# Patient Record
Sex: Male | Born: 1970 | Race: White | Hispanic: No | Smoking: Former smoker
Health system: Southern US, Community
[De-identification: ages and names within clinical notes are randomized; demographics above are authoritative.]

## PROBLEM LIST (undated history)

## (undated) ENCOUNTER — Ambulatory Visit: Admission: EM | Payer: Medicaid Other | Source: Home / Self Care

## (undated) DIAGNOSIS — D127 Benign neoplasm of rectosigmoid junction: Secondary | ICD-10-CM

## (undated) DIAGNOSIS — I152 Hypertension secondary to endocrine disorders: Secondary | ICD-10-CM

## (undated) DIAGNOSIS — K644 Residual hemorrhoidal skin tags: Secondary | ICD-10-CM

## (undated) DIAGNOSIS — K648 Other hemorrhoids: Secondary | ICD-10-CM

## (undated) DIAGNOSIS — K3189 Other diseases of stomach and duodenum: Secondary | ICD-10-CM

## (undated) DIAGNOSIS — E1159 Type 2 diabetes mellitus with other circulatory complications: Secondary | ICD-10-CM

## (undated) DIAGNOSIS — E785 Hyperlipidemia, unspecified: Secondary | ICD-10-CM

## (undated) DIAGNOSIS — K5730 Diverticulosis of large intestine without perforation or abscess without bleeding: Secondary | ICD-10-CM

## (undated) DIAGNOSIS — K5732 Diverticulitis of large intestine without perforation or abscess without bleeding: Secondary | ICD-10-CM

## (undated) DIAGNOSIS — K4020 Bilateral inguinal hernia, without obstruction or gangrene, not specified as recurrent: Secondary | ICD-10-CM

## (undated) DIAGNOSIS — G4730 Sleep apnea, unspecified: Secondary | ICD-10-CM

## (undated) DIAGNOSIS — K429 Umbilical hernia without obstruction or gangrene: Secondary | ICD-10-CM

## (undated) DIAGNOSIS — M779 Enthesopathy, unspecified: Secondary | ICD-10-CM

## (undated) DIAGNOSIS — G43909 Migraine, unspecified, not intractable, without status migrainosus: Secondary | ICD-10-CM

## (undated) DIAGNOSIS — E1169 Type 2 diabetes mellitus with other specified complication: Secondary | ICD-10-CM

## (undated) DIAGNOSIS — K649 Unspecified hemorrhoids: Secondary | ICD-10-CM

## (undated) DIAGNOSIS — K219 Gastro-esophageal reflux disease without esophagitis: Secondary | ICD-10-CM

## (undated) DIAGNOSIS — I10 Essential (primary) hypertension: Secondary | ICD-10-CM

## (undated) HISTORY — DX: Benign neoplasm of rectosigmoid junction: D12.7

## (undated) HISTORY — DX: Essential (primary) hypertension: I10

## (undated) HISTORY — DX: Sleep apnea, unspecified: G47.30

## (undated) HISTORY — DX: Other hemorrhoids: K64.8

## (undated) HISTORY — DX: Other diseases of stomach and duodenum: K31.89

## (undated) HISTORY — DX: Residual hemorrhoidal skin tags: K64.4

## (undated) HISTORY — PX: OTHER SURGICAL HISTORY: SHX169

## (undated) HISTORY — DX: Migraine, unspecified, not intractable, without status migrainosus: G43.909

## (undated) HISTORY — DX: Diverticulosis of large intestine without perforation or abscess without bleeding: K57.30

## (undated) HISTORY — DX: Umbilical hernia without obstruction or gangrene: K42.9

## (undated) HISTORY — DX: Hyperlipidemia, unspecified: E78.5

## (undated) HISTORY — DX: Hypertension secondary to endocrine disorders: I15.2

## (undated) HISTORY — PX: SHOULDER SURGERY: SHX246

## (undated) HISTORY — DX: Bilateral inguinal hernia, without obstruction or gangrene, not specified as recurrent: K40.20

## (undated) HISTORY — DX: Type 2 diabetes mellitus with other circulatory complications: E11.59

## (undated) HISTORY — DX: Type 2 diabetes mellitus with other specified complication: E11.69

## (undated) HISTORY — PX: KNEE SURGERY: SHX244

---

## 2005-07-13 ENCOUNTER — Emergency Department: Payer: Self-pay | Admitting: Emergency Medicine

## 2005-08-12 ENCOUNTER — Ambulatory Visit: Payer: Self-pay | Admitting: Orthopedic Surgery

## 2005-12-15 ENCOUNTER — Ambulatory Visit: Payer: Self-pay | Admitting: Orthopedic Surgery

## 2015-04-16 ENCOUNTER — Encounter: Payer: Self-pay | Admitting: Emergency Medicine

## 2015-04-16 ENCOUNTER — Emergency Department
Admission: EM | Admit: 2015-04-16 | Discharge: 2015-04-16 | Disposition: A | Discharge status: Home / Self Care | Payer: Self-pay | Attending: Emergency Medicine | Admitting: Emergency Medicine

## 2015-04-16 DIAGNOSIS — H109 Unspecified conjunctivitis: Secondary | ICD-10-CM | POA: Insufficient documentation

## 2015-04-16 DIAGNOSIS — H1131 Conjunctival hemorrhage, right eye: Secondary | ICD-10-CM | POA: Insufficient documentation

## 2015-04-16 DIAGNOSIS — Z720 Tobacco use: Secondary | ICD-10-CM | POA: Insufficient documentation

## 2015-04-16 MED ORDER — IBUPROFEN 800 MG PO TABS: 800.0000 mg | ORAL_TABLET | Freq: Three times a day (TID) | ORAL | Status: DC | PRN

## 2015-04-16 MED ORDER — FLUORESCEIN SODIUM 1 MG OP STRP
1.0000 | ORAL_STRIP | Freq: Once | OPHTHALMIC | Status: AC
Start: 2015-04-16 — End: 2015-04-16
  Administered 2015-04-16: 1 via OPHTHALMIC
  Filled 2015-04-16: qty 1

## 2015-04-16 MED ORDER — OXYCODONE-ACETAMINOPHEN 5-325 MG PO TABS: 1.0000 | ORAL_TABLET | ORAL | Status: DC | PRN

## 2015-04-16 MED ORDER — IBUPROFEN 800 MG PO TABS
800.0000 mg | ORAL_TABLET | Freq: Once | ORAL | Status: AC
  Administered 2015-04-16: 800 mg via ORAL
  Filled 2015-04-16: qty 1

## 2015-04-16 MED ORDER — TETRACAINE HCL 0.5 % OP SOLN
2.0000 [drp] | Freq: Once | OPHTHALMIC | Status: AC
  Administered 2015-04-16: 2 [drp] via OPHTHALMIC
  Filled 2015-04-16: qty 2

## 2015-04-16 MED ORDER — OXYCODONE-ACETAMINOPHEN 5-325 MG PO TABS
1.0000 | ORAL_TABLET | Freq: Once | ORAL | Status: AC
  Administered 2015-04-16: 1 via ORAL
  Filled 2015-04-16: qty 1

## 2015-04-16 MED ORDER — TOBRAMYCIN 0.3 % OP SOLN
2.0000 [drp] | OPHTHALMIC | Status: DC
  Administered 2015-04-16: 2 [drp] via OPHTHALMIC
  Filled 2015-04-16: qty 5

## 2015-04-16 NOTE — Discharge Instructions (Signed)
1. Apply antibiotic eyedrops 2 drops to right eye every 4 hours while awake 7 days. 2. Take pain medicines as needed (Motrin/Percocet #15). 3. Return to the ER for worsening symptoms, persistent vomiting, difficulty breathing or other concerns.  Conjunctivitis Conjunctivitis is commonly called "pink eye." Conjunctivitis can be caused by bacterial or viral infection, allergies, or injuries. There is usually redness of the lining of the eye, itching, discomfort, and sometimes discharge. There may be deposits of matter along the eyelids. A viral infection usually causes a watery discharge, while a bacterial infection causes a yellowish, thick discharge. Pink eye is very contagious and spreads by direct contact. You may be given antibiotic eyedrops as part of your treatment. Before using your eye medicine, remove all drainage from the eye by washing gently with warm water and cotton balls. Continue to use the medication until you have awakened 2 mornings in a row without discharge from the eye. Do not rub your eye. This increases the irritation and helps spread infection. Use separate towels from other household members. Wash your hands with soap and water before and after touching your eyes. Use cold compresses to reduce pain and sunglasses to relieve irritation from light. Do not wear contact lenses or wear eye makeup until the infection is gone. SEEK MEDICAL CARE IF:   Your symptoms are not better after 3 days of treatment.  You have increased pain or trouble seeing.  The outer eyelids become very red or swollen. Document Released: 08/12/2004 Document Revised: 09/27/2011 Document Reviewed: 07/05/2005 [facility] Patient Information 2015 [place], [place]. This information is not intended to replace advice given to you by your health care provider. Make sure you discuss any questions you have with your health care provider.  Subconjunctival Hemorrhage A subconjunctival hemorrhage is a bright red patch  covering a portion of the white of the eye. The white part of the eye is called the sclera, and it is covered by a thin membrane called the conjunctiva. This membrane is clear, except for tiny blood vessels that you can see with the naked eye. When your eye is irritated or inflamed and becomes red, it is because the vessels in the conjunctiva are swollen. Sometimes, a blood vessel in the conjunctiva can break and bleed. When this occurs, the blood builds up between the conjunctiva and the sclera, and spreads out to create a red area. The red spot may be very small at first. It may then spread to cover a larger part of the surface of the eye, or even all of the visible white part of the eye. In almost all cases, the blood will go away and the eye will become white again. Before completely dissolving, however, the red area may spread. It may also become brownish-yellow in color before going away. If a lot of blood collects under the conjunctiva, it may look like a bulge on the surface of the eye. This looks scary, but it will also eventually flatten out and go away. Subconjunctival hemorrhages do not cause pain, but if swollen, may cause a feeling of irritation. There is no effect on vision.  CAUSES   The most common cause is mild trauma (rubbing the eye, irritation).  Subconjunctival hemorrhages can happen because of coughing or straining (lifting heavy objects), vomiting, or sneezing.  In some cases, your doctor may want to check your blood pressure. High blood pressure can also cause a subconjunctival hemorrhage.  Severe trauma or blunt injuries.  Diseases that affect blood clotting (hemophilia, leukemia).  Abnormalities of blood vessels behind the eye (carotid cavernous sinus fistula).  Tumors behind the eye.  Certain drugs (aspirin, Coumadin, heparin).  Recent eye surgery. HOME CARE INSTRUCTIONS   Do not worry about the appearance of your eye. You may continue your usual  activities.  Often, follow-up is not necessary. SEEK MEDICAL CARE IF:   Your eye becomes painful.  The bleeding does not disappear within 3 weeks.  Bleeding occurs elsewhere, for example, under the skin, in the mouth, or in the other eye.  You have recurring subconjunctival hemorrhages. SEEK IMMEDIATE MEDICAL CARE IF:   Your vision changes or you have difficulty seeing.  You develop a severe headache, persistent vomiting, confusion, or abnormal drowsiness (lethargy).  Your eye seems to bulge or protrude from the eye socket.  You notice the sudden appearance of bruises or have spontaneous bleeding elsewhere on your body. Document Released: 07/05/2005 Document Revised: 11/19/2013 Document Reviewed: 06/02/2009 [facility] Patient Information 2015 [place], [place]. This information is not intended to replace advice given to you by your health care provider. Make sure you discuss any questions you have with your health care provider.  Eye Drops Use eye drops as directed. It may be easier to have someone help you put the drops in your eye. If you are alone, use the following instructions to help you.  Wash your hands before putting drops in your eyes.  Read the label and look at your medication. Check for any expiration date that may appear on the bottle or tube. Changes of color may be a warning that the medication is old or ineffective. This is especially true if the medication has become brown in color. If you have questions or concerns, call your caregiver. DROPS  Tilt your head back with the affected eye uppermost. Gently pull down on your lower lid. Do not pull up on the upper lid.  Look up. Place the dropper or bottle just over the edge of the lower lid near the white portion at the bottom of the eye. The goal is to have the drop go into the little sac formed by the lower lid and the bottom of the eye itself. Do not release the drop from a height of several inches over the eye. That  will only serve to startle the person receiving the medicine when it lands and forces a blink.  Steady your hand in a comfortable manner. An example would be to hold the dropper or bottle between your thumb and index (pointing) finger. Lean your index finger against the brow.  Then, slowly and gently squeeze one drop of medication into your eye.  Once the medication has been applied, place your finger between the lower eyelid and the nose, pressing firmly against the nose for 5-10 seconds. This will slow the process of the eye drop entering the small canal that normally drains tears into the nose, and therefore increases the exposure of the medicine to the eye for a few extra seconds. OINTMENTS  Look up. Place the tip of the tube just over the edge of the lower lid near the white portion at the bottom of the eye. The goal is to create a line of ointment along the inner surface of the eyelid in the little sac formed by the lower lid and the bottom of the eye itself.  Avoid touching the tube tip to your eyeball or eyelid. This avoids contamination of the tube or the medicine in the tube.  Once a line of medicine  has been created, hold the upper lid up and look down before releasing the upper lid. This will force the ointment to spread over the surface of the eye.  Your vision will be very blurry for a few minutes after applying an ointment properly. This is normal and will clear as you continue to blink. For this reason, it is best to apply ointments just before going to sleep, or at a time when you can rest your eyes for 5-10 minutes after applying the medication. GENERAL  Store your medicine in a cool, dry place after each use.  If you need a second medication, wait at least two minutes. This helps the first medication to be taken up (absorbed) by the eye.  If you have been instructed to use both an eye drop and an eye ointment, always apply the drop first and then the ointment 3-4 minutes  afterward. Never put medications into the eye unless the label reads, "For Ophthalmic Use," "For Use In Eyes" or "Eye Drops." If you have questions, call your caregiver. Document Released: 10/11/2000 Document Revised: 11/19/2013 Document Reviewed: 12/17/2008 [facility] Patient Information 2015 [place], [place]. This information is not intended to replace advice given to you by your health care provider. Make sure you discuss any questions you have with your health care provider.

## 2015-04-16 NOTE — ED Notes (Signed)
MD made aware of pt's BP at time of discharge. Per MD, pt should follow up with [place] for BP follow up. Pt informed of same. Pt verbalized understanding.

## 2015-04-16 NOTE — ED Provider Notes (Signed)
[facility] Emergency Department Provider Note  ____________________________________________  Time seen: Approximately 4:41 AM  I have reviewed the triage vital signs and the nursing notes.   HISTORY  Chief Complaint Conjunctivitis    HPI Keith Barber is a 44 y.o. male who presents to the ED from home with a chief complaint of right eye irritation, redness, pain and discharge. 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 Denies foreign body sensation. Complains of aching pain particularly to lateral aspect of eye. Noted yellow/green discharge. Notes nonproductive cough. Denies fever, chills, chest pain, shortness of breath, abdominal pain, vomiting, diarrhea.Nothing makes the pain better. Rubbing the eye makes the pain worse. No recent travel.  Past medical history None   There are no active problems to display for this patient.   Past surgical history Knee and shoulder   No current outpatient prescriptions on file.  Allergies Review of patient's allergies indicates no known allergies.  No family history on file.  Social History Social History  Substance Use Topics  . Smoking status: Current Every Day Smoker  . Smokeless tobacco: None  . Alcohol Use: None    Review of Systems Constitutional: No fever/chills Eyes: Positive for right eye pain and irritation and blurry vision. ENT: No sore throat. Cardiovascular: Denies chest pain. Respiratory: Denies shortness of breath. Gastrointestinal: No abdominal pain.  No nausea, no vomiting.  No diarrhea.  No constipation. Genitourinary: Negative for dysuria. Musculoskeletal: Negative for back pain. Skin: Negative for rash. Neurological: Negative for headaches, focal weakness or numbness.  10-point ROS otherwise negative.  ____________________________________________   PHYSICAL EXAM:  VITAL  SIGNS: ED Triage Vitals  Enc Vitals Group     BP 04/16/15 0038 163/121 mmHg     Pulse Rate 04/16/15 0038 87     Resp --      Temp 04/16/15 0038 98.1 F (36.7 C)     Temp Source 04/16/15 0038 Oral     SpO2 04/16/15 0038 96 %     Weight 04/16/15 0038 190 lb (86.183 kg)     Height 04/16/15 0038 5\' 8"  (1.727 m)     Head Cir --      Peak Flow --      Pain Score --      Pain Loc --      Pain Edu? --      Excl. in [place]? --     Constitutional: Alert and oriented. Well appearing and in mild acute distress. Eyes: Visual acuity noted. Left eye within normal limits. Right eye: No foreign body noted; eyelid everted for exam. Subconjunctival hemorrhage noted to the lateral aspect of right eye. Tetracaine drops placed and eye examined under Woods lamp with fluorescein stain; no corneal abrasion noted. No hyphema. No globe rupture. Funduscopy within normal limits.  Head: Atraumatic. Nose: No congestion/rhinnorhea. Mouth/Throat: Mucous membranes are moist.  Oropharynx non-erythematous. Neck: No stridor.   Cardiovascular: Normal rate, regular rhythm. Grossly normal heart sounds.  Good peripheral circulation. Respiratory: Normal respiratory effort.  No retractions. Lungs CTAB. Gastrointestinal: Soft and nontender. No distention. No abdominal bruits. No CVA tenderness. Musculoskeletal: No lower extremity tenderness nor edema.  No joint effusions. Neurologic:  Normal speech and language. No gross focal neurologic deficits are appreciated. No gait instability. Skin:  Skin is warm, dry and intact. No rash noted. Psychiatric: Mood and affect are normal. Speech and behavior are normal.  ____________________________________________   LABS (  all labs ordered are listed, but only abnormal results are displayed)  Labs Reviewed - No data to  display ____________________________________________  EKG  None ____________________________________________  RADIOLOGY  None ____________________________________________   PROCEDURES  Procedure(s) performed: None  Critical Care performed: No  ____________________________________________   INITIAL IMPRESSION / ASSESSMENT AND PLAN / ED COURSE  Pertinent labs & imaging results that were available during my care of the patient were reviewed by me and considered in my medical decision making (see chart for details).  45 year old male who presents with right eye conjunctivitis with lateral subconjunctival hemorrhage, nontraumatic. He is not on anticoagulants. Will treat with antibiotic eyedrops, analgesia and follow up ophthalmology. Asymptomatic hypertension noted. Blood pressure recheck with his doctor in 3 days. Strict return precautions given. Patient verbalizes understanding and agrees with plan of care. ____________________________________________   FINAL CLINICAL IMPRESSION(S) / ED DIAGNOSES  Final diagnoses:  Subconjunctival hemorrhage of right eye  Conjunctivitis of right eye      Paulette Blanch, MD 04/16/15 [phone]

## 2015-04-16 NOTE — ED Notes (Addendum)
Patient ambulatory to triage with steady gait, without difficulty or distress noted; pt reports right eye irritation/redness today; visual acuity right eye 20/50, left eye 20/70

## 2015-11-12 ENCOUNTER — Other Ambulatory Visit: Payer: Self-pay | Admitting: Surgery

## 2015-11-12 DIAGNOSIS — S43432A Superior glenoid labrum lesion of left shoulder, initial encounter: Secondary | ICD-10-CM

## 2015-11-12 DIAGNOSIS — M7582 Other shoulder lesions, left shoulder: Secondary | ICD-10-CM

## 2015-11-12 HISTORY — DX: Other shoulder lesions, left shoulder: M75.82

## 2015-11-12 HISTORY — DX: Superior glenoid labrum lesion of left shoulder, initial encounter: S43.432A

## 2015-11-19 ENCOUNTER — Other Ambulatory Visit: Payer: Self-pay

## 2015-11-22 ENCOUNTER — Other Ambulatory Visit: Payer: Self-pay

## 2016-11-23 DIAGNOSIS — E1169 Type 2 diabetes mellitus with other specified complication: Secondary | ICD-10-CM | POA: Insufficient documentation

## 2016-11-23 DIAGNOSIS — E1159 Type 2 diabetes mellitus with other circulatory complications: Secondary | ICD-10-CM | POA: Insufficient documentation

## 2016-11-23 DIAGNOSIS — E785 Hyperlipidemia, unspecified: Secondary | ICD-10-CM | POA: Insufficient documentation

## 2016-11-23 DIAGNOSIS — I152 Hypertension secondary to endocrine disorders: Secondary | ICD-10-CM | POA: Insufficient documentation

## 2016-11-23 DIAGNOSIS — I10 Essential (primary) hypertension: Secondary | ICD-10-CM | POA: Insufficient documentation

## 2016-11-23 DIAGNOSIS — G43909 Migraine, unspecified, not intractable, without status migrainosus: Secondary | ICD-10-CM | POA: Insufficient documentation

## 2016-11-24 ENCOUNTER — Ambulatory Visit (INDEPENDENT_AMBULATORY_CARE_PROVIDER_SITE_OTHER): Payer: BLUE CROSS/BLUE SHIELD | Admitting: Family Medicine

## 2016-11-24 ENCOUNTER — Encounter: Payer: Self-pay | Admitting: Family Medicine

## 2016-11-24 VITALS — BP 132/104 | HR 91 | Temp 97.9°F | Ht 67.25 in | Wt 202.0 lb

## 2016-11-24 DIAGNOSIS — E782 Mixed hyperlipidemia: Secondary | ICD-10-CM

## 2016-11-24 DIAGNOSIS — Z720 Tobacco use: Secondary | ICD-10-CM

## 2016-11-24 DIAGNOSIS — R1031 Right lower quadrant pain: Secondary | ICD-10-CM

## 2016-11-24 DIAGNOSIS — G43109 Migraine with aura, not intractable, without status migrainosus: Secondary | ICD-10-CM | POA: Diagnosis not present

## 2016-11-24 DIAGNOSIS — I10 Essential (primary) hypertension: Secondary | ICD-10-CM

## 2016-11-24 MED ORDER — BUPROPION HCL ER (SMOKING DET) 150 MG PO TB12: 150.0000 mg | ORAL_TABLET | Freq: Two times a day (BID) | ORAL | 3 refills | Status: DC

## 2016-11-24 MED ORDER — LISINOPRIL 10 MG PO TABS
10.0000 mg | ORAL_TABLET | Freq: Every day | ORAL | 3 refills | Status: DC
Start: 2016-11-24 — End: 2017-11-22

## 2016-11-24 MED ORDER — ROSUVASTATIN CALCIUM 10 MG PO TABS: 10.0000 mg | ORAL_TABLET | Freq: Every day | ORAL | 3 refills | Status: DC

## 2016-11-24 NOTE — Progress Notes (Signed)
BP (!) 132/104   Pulse 91   Temp 97.9 F (36.6 C)   Ht 5' 7.25" (1.708 m)   Wt 202 lb (91.6 kg)   SpO2 96%   BMI 31.40 kg/m    Subjective:    Patient ID: Keith Barber, male    DOB: [birth year], 46 y.o.   MRN: [id number]  HPI: Keith Barber is a 46 y.o. male  Chief Complaint  Patient presents with  . Establish Care  . Hernia    was told 6 years ago he had 2. Over the last 6 months ago he's developed more symptoms. Sharp pain at times if he coughs, etc.   Patient presents today to establish care. Has several concerns today.   BP - does not check at home. Currently taking amlodipine 10 mg. Denies CP, SOB, HAs, dizziness. Notes that he eats a lot of salt and caffeinated beverages. Smoking 1/2-1 ppd, very much wanting to try quitting.   HLD - states he has experiences leg cramps since around the time of starting lovastatin. Had never associated the two but now believes they may be connected. Denies CP, SOB, claudication. Does admit to having room for improvement in his diet. Eats a lot of pre-packaged snacks and frozen dinners. Also drinks a fair amount of sodas and sweet tea. Does not regularly exercise.   Migraines - gets floaters and blurred vision prior to a migraine coming on. Tries OTC headache relievers with minimal relief. Sometimes has N/V but not often. Denies previous head injury, visual disturbances, LOC.   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 Asymptomatic until the past 6 months. Pain is localized to umbilicus and RLQ, with worst sxs in RLQ. Coughing or sneezing makes it much worse. Putting pressure on area during sneezing seems to help reduce pain. Has not noticed any bulges or redness in these areas. Has not previously had any work-up regarding these sxs. Denies hx of abdominal surgeries or injuries.   Past Medical History:  Diagnosis Date  . Hyperlipidemia   . Hypertension   . Migraines    Social History   Social History    . Marital status: Divorced    Spouse name: N/A  . Number of children: N/A  . Years of education: N/A   Occupational History  . Not on file.   Social History Main Topics  . Smoking status: Current Every Day Smoker    Packs/day: 0.50    Types: Cigarettes  . Smokeless tobacco: Never Used  . Alcohol use No  . Drug use: No  . Sexual activity: Not on file   Other Topics Concern  . Not on file   Social History Narrative  . No narrative on file    Relevant past medical, surgical, family and social history reviewed and updated as indicated. Interim medical history since our last visit reviewed. Allergies and medications reviewed and updated.  Review of Systems  Constitutional: Negative.   HENT: Negative.   Eyes: Negative.   Respiratory: Negative.   Cardiovascular: Negative.   Gastrointestinal: Positive for abdominal pain (RLQ, umbilical) and nausea.  Genitourinary: Negative.   Musculoskeletal: Negative.   Neurological: Positive for headaches.  Psychiatric/Behavioral: Negative.    Per HPI unless specifically indicated above     Objective:    BP (!) 132/104   Pulse 91   Temp 97.9 F (36.6 C)   Ht 5' 7.25" (1.708 m)   Wt 202 lb (91.6 kg)  SpO2 96%   BMI 31.40 kg/m   Wt Readings from Last 3 Encounters:  11/30/16 201 lb (91.2 kg)  11/24/16 202 lb (91.6 kg)  04/16/15 190 lb (86.2 kg)    Physical Exam  Constitutional: He is oriented to person, place, and time. He appears well-developed and well-nourished. No distress.  HENT:  Head: Atraumatic.  Eyes: Conjunctivae are normal. Pupils are equal, round, and reactive to light.  Neck: Normal range of motion. Neck supple.  Cardiovascular: Normal rate and normal heart sounds.   Pulmonary/Chest: Effort normal and breath sounds normal. No respiratory distress.  Abdominal: Soft. Bowel sounds are normal. He exhibits mass. He exhibits no distension. There is tenderness (TTP in RLQ). There is no guarding.    Musculoskeletal:  Normal range of motion.  Neurological: He is alert and oriented to person, place, and time. He has normal reflexes. No cranial nerve deficit. Coordination normal.  Skin: Skin is warm and dry. No erythema.  Psychiatric: He has a normal mood and affect. His behavior is normal. Judgment and thought content normal.  Nursing note and vitals reviewed.  Results for orders placed or performed in visit on 11/24/16  Comprehensive metabolic panel  Result Value Ref Range   Glucose 119 (H) 65 - 99 mg/dL   BUN 9 6 - 24 mg/dL   Creatinine, Ser 1.06 0.76 - 1.27 mg/dL   GFR calc non Af Amer 83 >59 mL/min/1.73   GFR calc Af Amer 96 >59 mL/min/1.73   BUN/Creatinine Ratio 8 (L) 9 - 20   Sodium 141 134 - 144 mmol/L   Potassium 3.7 3.5 - 5.2 mmol/L   Chloride 101 96 - 106 mmol/L   CO2 23 18 - 29 mmol/L   Calcium 8.8 8.7 - 10.2 mg/dL   Total Protein 7.3 6.0 - 8.5 g/dL   Albumin 4.5 3.5 - 5.5 g/dL   Globulin, Total 2.8 1.5 - 4.5 g/dL   Albumin/Globulin Ratio 1.6 1.2 - 2.2   Bilirubin Total 0.5 0.0 - 1.2 mg/dL   Alkaline Phosphatase 144 (H) 39 - 117 IU/L   AST 24 0 - 40 IU/L   ALT 45 (H) 0 - 44 IU/L  Lipid Panel w/o Chol/HDL Ratio  Result Value Ref Range   Cholesterol, Total 172 100 - 199 mg/dL   Triglycerides 230 (H) 0 - 149 mg/dL   HDL 28 (L) >39 mg/dL   VLDL Cholesterol Cal 46 (H) 5 - 40 mg/dL   LDL Calculated 98 0 - 99 mg/dL      Assessment & Plan:   Problem List Items Addressed This Visit      Cardiovascular and Mediastinum   Hypertension    Will add 10 mg lisinopril as still not to goal. Long discussion about lifestyle modifications. DASH diet discussed, increasing physical activity, weight loss. Will start zyban for smoking cessation. Info given for Quit Smoking courses at [facility].       Relevant Medications   amLODipine (NORVASC) 10 MG tablet   rosuvastatin (CRESTOR) 10 MG tablet   lisinopril (PRINIVIL,ZESTRIL) 10 MG tablet   Migraines    Will start maxalt prn at first sign of migraine  coming on. Continue OTC headache relievers as well.       Relevant Medications   amLODipine (NORVASC) 10 MG tablet   rizatriptan (MAXALT) 10 MG tablet   rosuvastatin (CRESTOR) 10 MG tablet   lisinopril (PRINIVIL,ZESTRIL) 10 MG tablet     Other   Hyperlipidemia - Primary    Will switch  to crestor. Monitor closely for side effects. Pt educated on lifestyle modifications and agreeable to working on diet and exercise.       Relevant Medications   amLODipine (NORVASC) 10 MG tablet   rosuvastatin (CRESTOR) 10 MG tablet   lisinopril (PRINIVIL,ZESTRIL) 10 MG tablet   Other Relevant Orders   Comprehensive metabolic panel (Completed)   Lipid Panel w/o Chol/HDL Ratio (Completed)   Tobacco use    Very interested in quitting. Will start zyban for smoking cessation. Info given for Quit Smoking courses at [facility].       Other Visit Diagnoses    Right lower quadrant abdominal pain       Referral to general surgery placed for further eval given significant worsening of sxs. Return precautions given, tylenol or ibuprofen prn for pain.    Relevant Orders   Ambulatory referral to General Surgery       Follow up plan: Return in about 4 weeks (around 12/22/2016) for BP.

## 2016-11-25 ENCOUNTER — Telehealth: Payer: Self-pay | Admitting: Family Medicine

## 2016-11-25 LAB — COMPREHENSIVE METABOLIC PANEL
A/G RATIO: 1.6 (ref 1.2–2.2)
ALT: 45 IU/L — ABNORMAL HIGH (ref 0–44)
AST: 24 IU/L (ref 0–40)
Albumin: 4.5 g/dL (ref 3.5–5.5)
Alkaline Phosphatase: 144 IU/L — ABNORMAL HIGH (ref 39–117)
BILIRUBIN TOTAL: 0.5 mg/dL (ref 0.0–1.2)
BUN/Creatinine Ratio: 8 — ABNORMAL LOW (ref 9–20)
BUN: 9 mg/dL (ref 6–24)
CALCIUM: 8.8 mg/dL (ref 8.7–10.2)
CO2: 23 mmol/L (ref 18–29)
Chloride: 101 mmol/L (ref 96–106)
Creatinine, Ser: 1.06 mg/dL (ref 0.76–1.27)
GFR, EST AFRICAN AMERICAN: 96 mL/min/{1.73_m2} (ref >59)
GFR, EST NON AFRICAN AMERICAN: 83 mL/min/{1.73_m2} (ref >59)
GLOBULIN, TOTAL: 2.8 g/dL (ref 1.5–4.5)
Glucose: 119 mg/dL — ABNORMAL HIGH (ref 65–99)
POTASSIUM: 3.7 mmol/L (ref 3.5–5.2)
SODIUM: 141 mmol/L (ref 134–144)
Total Protein: 7.3 g/dL (ref 6.0–8.5)

## 2016-11-25 LAB — LIPID PANEL W/O CHOL/HDL RATIO
Cholesterol, Total: 172 mg/dL (ref 100–199)
HDL: 28 mg/dL — ABNORMAL LOW (ref >39)
LDL Calculated: 98 mg/dL (ref 0–99)
TRIGLYCERIDES: 230 mg/dL — AB (ref 0–149)
VLDL Cholesterol Cal: 46 mg/dL — ABNORMAL HIGH (ref 5–40)

## 2016-11-25 NOTE — Telephone Encounter (Signed)
Patient notified of results.

## 2016-11-25 NOTE — Telephone Encounter (Signed)
Please call pt and let him know that his labs came back normal given his non-fasting state. His TGs and glucose were elevated but that is to be expected since he had eaten. Continue as planned

## 2016-11-26 DIAGNOSIS — Z87891 Personal history of nicotine dependence: Secondary | ICD-10-CM

## 2016-11-26 HISTORY — DX: Personal history of nicotine dependence: Z87.891

## 2016-11-30 ENCOUNTER — Ambulatory Visit (INDEPENDENT_AMBULATORY_CARE_PROVIDER_SITE_OTHER): Payer: BLUE CROSS/BLUE SHIELD | Admitting: Surgery

## 2016-11-30 ENCOUNTER — Encounter: Payer: Self-pay | Admitting: Surgery

## 2016-11-30 VITALS — BP 132/90 | HR 102 | Temp 98.9°F | Ht 67.0 in | Wt 201.0 lb

## 2016-11-30 DIAGNOSIS — R1031 Right lower quadrant pain: Secondary | ICD-10-CM | POA: Diagnosis not present

## 2016-11-30 NOTE — Patient Instructions (Addendum)
We have scheduled your CT Scan for the [facility] for next Thursday 12/09/16 at [place] will need to arrive 15 minutes early.  The day prior to your appointment you will need to pick up your prep-kit on 12/08/16.

## 2016-12-01 ENCOUNTER — Telehealth: Payer: Self-pay

## 2016-12-01 NOTE — Telephone Encounter (Signed)
Spoke with Dr. Dahlia Byes and was instructed with additional lab orders needed for patient. Additional orders being CBC and EKG. Will contact patient at this time to let him know about these additional procedures that will need to be done

## 2016-12-01 NOTE — Progress Notes (Signed)
Patient ID: Keith Barber, male   DOB: Aug 26, 1970, 46 y.o.   MRN: [id number]  HPI [name] is a 46 y.o. male seen in consultation at the request of Mrs. Alvo Utah. For abdominal pain and abdominal wall hernias. He reports that over the last couple months he has experienced right-sided abdominal pain that is intermittent and moderate to severe in intensity. The pain is sharp, worsening with certain movements. There is no specific out of eating factors. He does have a known history of both umbilical hernias and right inguinal hernia. No previous abdominal surgeries or hernia repairs. He does have a history of vertebral column issues along with back pain. There are no imaging studies available at this time. He does smoke about half pack per day. He is able to perform more than 4 Mets of activity without any dyspnea or angina. I have personally reviewed the medical records form the referring provider. Most recent laboratory values show a normal creatinine is slightly elevated alkaline phosphatase and a LFT.  HPI  Past Medical History:  Diagnosis Date  . Hyperlipidemia   . Hypertension   . Migraines     Past Surgical History:  Procedure Laterality Date  . KNEE SURGERY Left   . SHOULDER SURGERY Right     Family History  Problem Relation Age of Onset  . Diabetes Mother   . Migraines Mother   . Cancer Neg Hx   . COPD Neg Hx   . Heart disease Neg Hx   . Stroke Neg Hx     Social History Social History  Substance Use Topics  . Smoking status: Current Every Day Smoker    Packs/day: 0.50    Types: Cigarettes  . Smokeless tobacco: Never Used  . Alcohol use No    No Known Allergies  Current Outpatient Prescriptions  Medication Sig Dispense Refill  . amLODipine (NORVASC) 10 MG tablet Take 10 mg by mouth daily.    Marland Kitchen lisinopril (PRINIVIL,ZESTRIL) 10 MG tablet Take 1 tablet (10 mg total) by mouth daily. 90 tablet 3  . rizatriptan (MAXALT) 10 MG tablet Take 10 mg by mouth as needed.  2   . rosuvastatin (CRESTOR) 10 MG tablet Take 1 tablet (10 mg total) by mouth daily. 90 tablet 3  . buPROPion (ZYBAN) 150 MG 12 hr tablet Take 1 tablet (150 mg total) by mouth 2 (two) times daily. (Patient taking differently: Take 150 mg by mouth 2 (two) times daily. ) 60 tablet 3   No current facility-administered medications for this visit.      Review of Systems Full ROS  was asked and was negative except for the information on the HPI  Physical Exam Blood pressure 132/90, pulse (!) 102, temperature 98.9 F (37.2 C), temperature source Oral, height 5\' 7"  (1.702 m), weight 91.2 kg (201 lb). CONSTITUTIONAL: NAD, non toxic. EYES: Pupils are equal, round, and reactive to light, Sclera are non-icteric. EARS, NOSE, MOUTH AND THROAT: The oropharynx is clear. The oral mucosa is pink and moist. Hearing is intact to voice. LYMPH NODES:  Lymph nodes in the neck are normal. RESPIRATORY:  Lungs are clear. There is normal respiratory effort, with equal breath sounds bilaterally, and without pathologic use of accessory muscles. CARDIOVASCULAR: Heart is regular without murmurs, gallops, or rubs. GI: The abdomen is soft, tender abdomen to the right of the umbilicus. There is both reducible right inguinal hernia and umbilical hernia. The right inguinal hernia is mildly tender to palpation. GU: Rectal deferred.   MUSCULOSKELETAL:  Normal muscle strength and tone. No cyanosis or edema.   SKIN: Turgor is good and there are no pathologic skin lesions or ulcers. NEUROLOGIC: Motor and sensation is grossly normal. Cranial nerves are grossly intact. PSYCH:  Oriented to person, place and time. Affect is normal.  Data Reviewed I have personally reviewed the patient's imaging, laboratory findings and medical records.    Assessment  /Plan Right flank pain associated with an umbilical hernia and a right inguinal hernia. It is certainly possible that this is referred pain from either an inguinal hernia or umbilical  hernia. It is also possible that he may have an underlying pathology or any hernia that I cannot feel on physical exam. For this reason and to delineate the abdominal wall anatomy I'll order a CT scan of the abdomen and pelvis. I will follow him up in a couple weeks and discuss the results of the CT scan with the patient in detail. At that time will make determination whether or not to proceed with repair of inguinal hernia and umbilical hernia. At this point there is no need for any emergent surgical intervention . I will also order an EKG and CBC for preop assessment. Extensive counseling provided.  Caroleen Hamman, MD FACS General Surgeon 12/01/2016, 8:58 AM

## 2016-12-01 NOTE — Telephone Encounter (Signed)
Spoke with patient at this time and informed him of the additional orders that he needs to have completed before we see him in office. These labs are CBC and EKG. I told the patient that these would need to be completed at the [facility] and that he does not have to fast for these labs and that he can do them at his own time. Patient verbalized understanding.

## 2016-12-01 NOTE — Addendum Note (Signed)
Addended by: Lowella Curb on: 12/01/2016 02:15 PM   Modules accepted: Orders

## 2016-12-06 NOTE — Assessment & Plan Note (Signed)
Will add 10 mg lisinopril as still not to goal. Long discussion about lifestyle modifications. DASH diet discussed, increasing physical activity, weight loss. Will start zyban for smoking cessation. Info given for Quit Smoking courses at [facility].

## 2016-12-06 NOTE — Assessment & Plan Note (Signed)
Very interested in quitting. Will start zyban for smoking cessation. Info given for Quit Smoking courses at [facility].

## 2016-12-06 NOTE — Assessment & Plan Note (Signed)
Will start maxalt prn at first sign of migraine coming on. Continue OTC headache relievers as well.

## 2016-12-06 NOTE — Assessment & Plan Note (Signed)
Will switch to crestor. Monitor closely for side effects. Pt educated on lifestyle modifications and agreeable to working on diet and exercise.

## 2016-12-07 ENCOUNTER — Telehealth: Payer: Self-pay

## 2016-12-07 NOTE — Telephone Encounter (Signed)
We had completed a prior auth for this patient to have a CT abdomen and pelvis. He is wanting to go to [facility] in [place]. We need to Fax the order to them at [phone] for them to make an appointment. Their phone number is [phone]

## 2016-12-08 ENCOUNTER — Other Ambulatory Visit: Payer: Self-pay

## 2016-12-08 ENCOUNTER — Telehealth: Payer: Self-pay | Admitting: General Practice

## 2016-12-08 NOTE — Telephone Encounter (Signed)
Patient returned call wanting to know the specifics of the CT Scan appointment. He also asked about needing to get his CBC and EKG completed at this time. Due to not being able to pay out of pocket for EKG at this time.I informed him that he can hold off on getting his CBC and EKG completed for right now and to go forward with getting his CT Scan tomorrow. But if these orders need to be completed prior to his next office visit that we will contact him to let him know before hand. Patient verbalized understand.

## 2016-12-08 NOTE — Telephone Encounter (Signed)
Called patient at this time to inform him of his CT Scan that is scheduled for tomorrow at [facility] in [facility] Left a message stating that his appointment will be at 11:30 tomorrow 5/24 but he will need to arrive at 10:00AM to take the prep. Asked him to call our office back with any concerns or questions regarding this appointment.

## 2016-12-08 NOTE — Telephone Encounter (Signed)
Called Patient at this time to inform him that he would have to get a copy of the images as well as the records after having his CT scan at  [facility] in [place]. Patient verbalized understanding and also informed me that his insurance was covering his visit at  [facility] in [place]  Instead of at the Outpatient office where he was original scheduled.

## 2016-12-08 NOTE — Telephone Encounter (Signed)
Called over to [facility] in [place] spoke with a scheduler to schedule this appointment for patient. His appointment will be at 11:30 tomorrow 5/24 but he will need to arrive at 10:00AM to take the prep.

## 2016-12-09 ENCOUNTER — Ambulatory Visit: Payer: BLUE CROSS/BLUE SHIELD

## 2016-12-24 ENCOUNTER — Ambulatory Visit: Payer: Self-pay | Admitting: Surgery

## 2016-12-24 ENCOUNTER — Encounter: Payer: Self-pay | Admitting: Surgery

## 2016-12-24 ENCOUNTER — Ambulatory Visit (INDEPENDENT_AMBULATORY_CARE_PROVIDER_SITE_OTHER): Payer: BLUE CROSS/BLUE SHIELD | Admitting: Surgery

## 2016-12-24 ENCOUNTER — Telehealth: Payer: Self-pay

## 2016-12-24 VITALS — BP 133/94 | HR 79 | Temp 98.1°F | Ht 67.0 in | Wt 200.4 lb

## 2016-12-24 DIAGNOSIS — K4020 Bilateral inguinal hernia, without obstruction or gangrene, not specified as recurrent: Secondary | ICD-10-CM | POA: Diagnosis not present

## 2016-12-24 NOTE — Telephone Encounter (Signed)
Medical Clearance faxed to Dr.Feldpausch at this time. Dr.Virk has recently retired.

## 2016-12-24 NOTE — Patient Instructions (Signed)
We will schedule your surgery on 01/21/17 at [facility] with Dr.Pabon. Please see your blue pre-care sheet for surgery information. Please call our office if you have questions or concerns.

## 2016-12-24 NOTE — Progress Notes (Signed)
Outpatient Surgical Follow Up  12/24/2016  Keith Barber is an 46 y.o. male.   Chief Complaint  Patient presents with  . Follow-up    Right Lower Quadrant Pain CT Scan 12/09/2016-Dr Pabon    HPI: [name]'s following up for symptomatic hernias. He has some intermittent abdominal pain around the periumbilical area and right and left lower quadrant. Pain is intermittent is moderate in intensity. Apparently the pain worsens when he lifts heavy stuff. I have ordered a CT scan that have personally reviewed. There is evidence of bilateral inguinal hernia and umbilical hernia. There is no evidence of any other surgical intervention I'll pathology. The liver does have some steatosis.  no evidence of ascites or cirrhosis.  Past Medical History:  Diagnosis Date  . Hyperlipidemia   . Hypertension   . Migraines     Past Surgical History:  Procedure Laterality Date  . KNEE SURGERY Left   . SHOULDER SURGERY Right     Family History  Problem Relation Age of Onset  . Diabetes Mother   . Migraines Mother   . Cancer Neg Hx   . COPD Neg Hx   . Heart disease Neg Hx   . Stroke Neg Hx     Social History:  reports that he has been smoking Cigarettes.  He has been smoking about 0.50 packs per day. He has never used smokeless tobacco. He reports that he does not drink alcohol or use drugs.  Allergies: No Known Allergies  Medications reviewed.    ROS Full ROS performed and is otherwise negative other than what is stated in HPI   BP (!) 133/94   Pulse 79   Temp 98.1 F (36.7 C) (Oral)   Ht 5\' 7"  (1.702 m)   Wt 90.9 kg (200 lb 6.4 oz)   BMI 31.39 kg/m   Physical Exam  Constitutional: He is oriented to person, place, and time and well-developed, well-nourished, and in no distress. No distress.  Neck: No JVD present. No tracheal deviation present.  Cardiovascular: Normal rate, regular rhythm and intact distal pulses.   Pulmonary/Chest: Effort normal and breath sounds normal. No stridor. He  exhibits no tenderness.  Abdominal: Soft. He exhibits no distension. There is no tenderness. There is no rebound and no guarding.  Reducible UH and reducible BIH. Right larger than left  Musculoskeletal: Normal range of motion. He exhibits no edema.  Neurological: He is alert and oriented to person, place, and time. Gait normal. GCS score is 15.  Skin: Skin is warm and dry. He is not diaphoretic.  Psychiatric: Mood, memory, affect and judgment normal.  Nursing note and vitals reviewed.   Assessment/Plan:  1. Non-recurrent bilateral inguinal hernia without obstruction or gangrene Cousins with patient in detail about his disease process. He wishes to undergo surgical attention. We will go ahead and schedule him for bilateral laparoscopic repair of inguinal hernias as well as an open book hernia repair. I have explained the procedure, risks, and aftercare of inguinal hernia repair to [name].   Risks include but are not limited to bleeding, infection, wound problems, anesthesia, recurrence, bladder or intestine injury, urinary retention, testicular dysfunction, chronic pain, mesh problems.  He  seems to understand and agrees to proceed.  Questions were answered to his stated satisfaction. I spent 40 minutes in this encounter with greater than 50% of the time spent in coordinating/counseling of care.     Caroleen Hamman, MD [facility] General Surgeon

## 2016-12-28 ENCOUNTER — Telehealth: Payer: Self-pay | Admitting: Surgery

## 2016-12-28 ENCOUNTER — Telehealth: Payer: Self-pay

## 2016-12-28 NOTE — Telephone Encounter (Signed)
Mandy from [facility] called to say patient is no longer a patient there.  Medical Clearance faxed to [facility] Merrie Roof at this time.

## 2016-12-28 NOTE — Telephone Encounter (Signed)
Pt advised of pre op date/time and sx date. Sx: 01/21/17 with Dr Pabon--Laparoscopic bilateral inguinal hernia repair with open umbilical hernia repair. Pre op: 01/12/17 between 9-1:00pm--Phone.   Patient made aware to call [phone], between 1-3:00pm the day before surgery, to find out what time to arrive.  Pt will make a pmt of 500.00 prior to surgery.

## 2016-12-28 NOTE — Telephone Encounter (Signed)
Medical Clearance faxed to Alanson Aly at this time.   Patient has not been seen since 2017 and may need an appointment however [facility] to reach out to patient if in need of an appointment.

## 2016-12-29 ENCOUNTER — Ambulatory Visit: Payer: BLUE CROSS/BLUE SHIELD | Admitting: Family Medicine

## 2017-01-05 ENCOUNTER — Telehealth: Payer: Self-pay | Admitting: Family Medicine

## 2017-01-05 NOTE — Telephone Encounter (Signed)
Patient has appt on 01/07/17, will complete paperwork then. Shelia notified.

## 2017-01-05 NOTE — Telephone Encounter (Signed)
Shelia with [facility] called to check on the surgical clearance on patient He is scheduled for surgery on 01-21-2017  Adela Lank can be reached at [phone]  Thanks

## 2017-01-05 NOTE — Telephone Encounter (Signed)
Spoke with Santiago Glad at this time regarding Medical Clearance. She will check on this and someone call me back today.

## 2017-01-05 NOTE — Telephone Encounter (Signed)
Keith Barber called from [facility] practice. Patient has an appointment 01/07/17 and Medical Clearance will be completed at this time.

## 2017-01-07 ENCOUNTER — Ambulatory Visit (INDEPENDENT_AMBULATORY_CARE_PROVIDER_SITE_OTHER): Payer: BLUE CROSS/BLUE SHIELD | Admitting: Family Medicine

## 2017-01-07 ENCOUNTER — Encounter: Payer: Self-pay | Admitting: Family Medicine

## 2017-01-07 VITALS — BP 122/86 | HR 68 | Wt 199.0 lb

## 2017-01-07 DIAGNOSIS — E1159 Type 2 diabetes mellitus with other circulatory complications: Secondary | ICD-10-CM

## 2017-01-07 DIAGNOSIS — I10 Essential (primary) hypertension: Secondary | ICD-10-CM | POA: Diagnosis not present

## 2017-01-07 DIAGNOSIS — Z01818 Encounter for other preprocedural examination: Secondary | ICD-10-CM

## 2017-01-07 DIAGNOSIS — E782 Mixed hyperlipidemia: Secondary | ICD-10-CM

## 2017-01-07 DIAGNOSIS — I152 Hypertension secondary to endocrine disorders: Secondary | ICD-10-CM

## 2017-01-07 DIAGNOSIS — Z720 Tobacco use: Secondary | ICD-10-CM

## 2017-01-07 MED ORDER — AMLODIPINE BESYLATE 10 MG PO TABS: 10.0000 mg | ORAL_TABLET | Freq: Every day | ORAL | 11 refills | Status: DC

## 2017-01-07 NOTE — Progress Notes (Signed)
BP 122/86   Pulse 68   Wt 199 lb (90.3 kg)   SpO2 97%   BMI 31.17 kg/m    Subjective:    Patient ID: Keith Barber, male    DOB: Feb 06, 1971, 46 y.o.   MRN: [id number]  HPI: Keith Barber is a 46 y.o. male  Chief Complaint  Patient presents with  . Follow-up  . Hypertension   Patient presents today for BP f/u after adding 10 mg lisinopril. Has not been checking BPs at home. Denies any side effects with the medication. Also switched cholesterol medicines from lipitor to crestor at previous visit d/t cramping. Still having occasional cramps but much improved and tolerable.   Having surgery for inguinal hernia repair in 2 weeks and has a surgical clearance form to be filled out.   Notes he is planning to start his smoking cessation medication tomorrow and hopes to use his recovery time to quit smoking.   Past Medical History:  Diagnosis Date  . Hyperlipidemia   . Hypertension   . Migraines    Social History   Social History  . Marital status: Divorced    Spouse name: N/A  . Number of children: N/A  . Years of education: N/A   Occupational History  . Not on file.   Social History Main Topics  . Smoking status: Current Every Day Smoker    Packs/day: 0.50    Types: Cigarettes  . Smokeless tobacco: Never Used  . Alcohol use No  . Drug use: No  . Sexual activity: Not on file   Other Topics Concern  . Not on file   Social History Narrative  . No narrative on file   Relevant past medical, surgical, family and social history reviewed and updated as indicated. Interim medical history since our last visit reviewed. Allergies and medications reviewed and updated.  Review of Systems  Constitutional: Negative.   HENT: Negative.   Respiratory: Negative.   Cardiovascular: Negative.   Gastrointestinal: Negative.   Genitourinary: Negative.   Musculoskeletal: Positive for myalgias.  Neurological: Negative.   Psychiatric/Behavioral: Negative.     Per HPI unless  specifically indicated above     Objective:    BP 122/86   Pulse 68   Wt 199 lb (90.3 kg)   SpO2 97%   BMI 31.17 kg/m   Wt Readings from Last 3 Encounters:  01/07/17 199 lb (90.3 kg)  12/24/16 200 lb 6.4 oz (90.9 kg)  11/30/16 201 lb (91.2 kg)    Physical Exam  Constitutional: He is oriented to person, place, and time. He appears well-developed and well-nourished. No distress.  HENT:  Head: Atraumatic.  Eyes: Conjunctivae are normal. Pupils are equal, round, and reactive to light.  Neck: Normal range of motion. Neck supple.  Cardiovascular: Normal rate and normal heart sounds.   Pulmonary/Chest: Effort normal and breath sounds normal. No respiratory distress.  Musculoskeletal: Normal range of motion.  Neurological: He is alert and oriented to person, place, and time.  Skin: Skin is warm and dry.  Psychiatric: He has a normal mood and affect. His behavior is normal.  Nursing note and vitals reviewed.   Results for orders placed or performed in visit on 11/24/16  Comprehensive metabolic panel  Result Value Ref Range   Glucose 119 (H) 65 - 99 mg/dL   BUN 9 6 - 24 mg/dL   Creatinine, Ser 1.06 0.76 - 1.27 mg/dL   GFR calc non Af Amer 83 >59 mL/min/1.73   GFR calc  Af Amer 96 >59 mL/min/1.73   BUN/Creatinine Ratio 8 (L) 9 - 20   Sodium 141 134 - 144 mmol/L   Potassium 3.7 3.5 - 5.2 mmol/L   Chloride 101 96 - 106 mmol/L   CO2 23 18 - 29 mmol/L   Calcium 8.8 8.7 - 10.2 mg/dL   Total Protein 7.3 6.0 - 8.5 g/dL   Albumin 4.5 3.5 - 5.5 g/dL   Globulin, Total 2.8 1.5 - 4.5 g/dL   Albumin/Globulin Ratio 1.6 1.2 - 2.2   Bilirubin Total 0.5 0.0 - 1.2 mg/dL   Alkaline Phosphatase 144 (H) 39 - 117 IU/L   AST 24 0 - 40 IU/L   ALT 45 (H) 0 - 44 IU/L  Lipid Panel w/o Chol/HDL Ratio  Result Value Ref Range   Cholesterol, Total 172 100 - 199 mg/dL   Triglycerides 230 (H) 0 - 149 mg/dL   HDL 28 (L) >39 mg/dL   VLDL Cholesterol Cal 46 (H) 5 - 40 mg/dL   LDL Calculated 98 0 - 99  mg/dL      Assessment & Plan:   Problem List Items Addressed This Visit      Cardiovascular and Mediastinum   Hypertension    Stable and WNL with lisinopril and amlodipine. Continue current regimen. Await BMP results. Recommended home monitoring to keep an eye on it. DASH diet reviewed again. Encouraged continued efforts at smoking cessation as discussed      Relevant Medications   amLODipine (NORVASC) 10 MG tablet     Other   Hyperlipidemia    Discussed switching medications again as there are still mild occasional side effects, but pt wishes to keep trying crestor for now. Recent lipid panel WNL on crestor. Will recheck fasting in 6 months. Continue working on lifestyle modifications      Relevant Medications   amLODipine (NORVASC) 10 MG tablet   Tobacco use    Encouraged starting the medication, discussed good habit modifying techniques to help keep from smoking while he is home recovering. 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 Form signed and faxed to surgeon       Follow up plan: Return in about 6 months (around 07/09/2017) for HLD, BP recheck.

## 2017-01-07 NOTE — Assessment & Plan Note (Signed)
Encouraged starting the medication, discussed good habit modifying techniques to help keep from smoking while he is home recovering. Follow up with any concerns

## 2017-01-07 NOTE — Assessment & Plan Note (Addendum)
Discussed switching medications again as there are still mild occasional side effects, but pt wishes to keep trying crestor for now. Recent lipid panel WNL on crestor. Will recheck fasting in 6 months. Continue working on lifestyle modifications

## 2017-01-07 NOTE — Assessment & Plan Note (Signed)
Stable and WNL with lisinopril and amlodipine. Continue current regimen. Await BMP results. Recommended home monitoring to keep an eye on it. DASH diet reviewed again. Encouraged continued efforts at smoking cessation as discussed

## 2017-01-08 LAB — BASIC METABOLIC PANEL
BUN / CREAT RATIO: 13 (ref 9–20)
BUN: 13 mg/dL (ref 6–24)
CHLORIDE: 102 mmol/L (ref 96–106)
CO2: 15 mmol/L — AB (ref 20–29)
Calcium: 9.2 mg/dL (ref 8.7–10.2)
Creatinine, Ser: 0.99 mg/dL (ref 0.76–1.27)
GFR calc Af Amer: 104 mL/min/{1.73_m2} (ref >59)
GFR calc non Af Amer: 90 mL/min/{1.73_m2} (ref >59)
GLUCOSE: 118 mg/dL — AB (ref 65–99)
POTASSIUM: 4.5 mmol/L (ref 3.5–5.2)
SODIUM: 137 mmol/L (ref 134–144)

## 2017-01-10 ENCOUNTER — Telehealth: Payer: Self-pay

## 2017-01-10 NOTE — Telephone Encounter (Signed)
Medical Clearance obtained at this time from [facility].

## 2017-01-11 ENCOUNTER — Encounter: Payer: Self-pay | Admitting: Family Medicine

## 2017-01-12 ENCOUNTER — Encounter
Admission: RE | Admit: 2017-01-12 | Discharge: 2017-01-12 | Disposition: A | Discharge status: Home / Self Care | Payer: BLUE CROSS/BLUE SHIELD | Source: Ambulatory Visit | Attending: Surgery | Admitting: Surgery

## 2017-01-12 HISTORY — DX: Gastro-esophageal reflux disease without esophagitis: K21.9

## 2017-01-12 HISTORY — DX: Enthesopathy, unspecified: M77.9

## 2017-01-12 NOTE — Patient Instructions (Signed)
  Your procedure is scheduled on: 01-21-17 FRIDAY Report to Same Day Surgery 2nd floor medical mall [facility] Entrance-take elevator on left to 2nd floor.  Check in with surgery information desk.) To find out your arrival time please call [phone] between 1PM - 3PM on 01-20-17 THURSDAY  Remember: Instructions that are not followed completely may result in serious medical risk, up to and including death, or upon the discretion of your surgeon and anesthesiologist your surgery may need to be rescheduled.    _x___ 1. Do not eat food or drink liquids after midnight. No gum chewing or hard candies.     __x__ 2. No Alcohol for 24 hours before or after surgery.   __x__3. No Smoking for 24 prior to surgery.   ____  4. Bring all medications with you on the day of surgery if instructed.    __x__ 5. Notify your doctor if there is any change in your medical condition     (cold, fever, infections).     Do not wear jewelry, make-up, hairpins, clips or nail polish.  Do not wear lotions, powders, or perfumes. You may wear deodorant.  Do not shave 48 hours prior to surgery. Men may shave face and neck.  Do not bring valuables to the hospital.    [facility] is not responsible for any belongings or valuables.               Contacts, dentures or bridgework may not be worn into surgery.  Leave your suitcase in the car. After surgery it may be brought to your room.  For patients admitted to the hospital, discharge time is determined by your treatment team.   Patients discharged the day of surgery will not be allowed to drive home.  You will need someone to drive you home and stay with you the night of your procedure.    Please read over the following fact sheets that you were given:     _x___ [place] WITH A SMALL SIP OF WATER. These include:  1. AMLODIPINE (NORVASC)  2. BUPROPION (ZYBAN)  3. LISINOPRIL  4. ROSUVASTATIN  (CRESTOR)  5.  6.  ____Fleets enema or Magnesium Citrate as directed.   _x___ Use CHG Soap or sage wipes as directed on instruction sheet   ____ Use inhalers on the day of surgery and bring to hospital day of surgery  ____ Stop Metformin and Janumet 2 days prior to surgery.    ____ Take 1/2 of usual insulin dose the night before surgery and none on the morning surgery.   ____ Follow recommendations from Cardiologist, Pulmonologist or PCP regarding stopping Aspirin, Coumadin, Pllavix ,Eliquis, Effient, or Pradaxa, and Pletal.  X____Stop Anti-inflammatories such as ADVIL, Aleve, Ibuprofen, Motrin, NAPROXEN, Naprosyn, Goodies powders or aspirin products 7 DAYS PRIOR TO SURGERY-OK to take Tylenol    ____ Stop supplements until after surgery   ____ Bring C-Pap to the hospital.

## 2017-01-14 ENCOUNTER — Ambulatory Visit: Payer: BLUE CROSS/BLUE SHIELD | Admitting: Family Medicine

## 2017-01-17 ENCOUNTER — Encounter
Admission: RE | Admit: 2017-01-17 | Discharge: 2017-01-17 | Disposition: A | Discharge status: Home / Self Care | Payer: BLUE CROSS/BLUE SHIELD | Source: Ambulatory Visit | Attending: Surgery | Admitting: Surgery

## 2017-01-17 ENCOUNTER — Telehealth: Payer: Self-pay | Admitting: Family Medicine

## 2017-01-17 DIAGNOSIS — I10 Essential (primary) hypertension: Secondary | ICD-10-CM | POA: Insufficient documentation

## 2017-01-17 NOTE — Telephone Encounter (Signed)
Patient called to find out when/how he should titrate on the Zyban for smoking. He has stopped smoking 3 days ago but per the medication bottle it states not to stop suddenly.  Please advise.  Thanks  He is scheduled for eye surgery on Friday also   [phone]

## 2017-01-17 NOTE — Pre-Procedure Instructions (Signed)
PATIENT GOING TO BE UP ALL NIGHT PREOP. OK'ED CLEAR LIQUIDS UNTIL 6 HOURS PREOP. SURGERY IN AFTERNOON

## 2017-01-17 NOTE — Telephone Encounter (Signed)
He can take 1 tab qAM for 1 week instead of 1 tab BID and then he can stop it. Thanks.

## 2017-01-17 NOTE — Telephone Encounter (Signed)
Please advise on how to titrate off medication.

## 2017-01-18 NOTE — Telephone Encounter (Signed)
Message relayed to patient. Verbalized understanding and denied questions.   

## 2017-01-20 MED ORDER — CEFAZOLIN SODIUM-DEXTROSE 2-4 GM/100ML-% IV SOLN: 2.0000 g | INTRAVENOUS | Status: DC

## 2017-01-21 ENCOUNTER — Encounter
Admission: RE | Disposition: A | Discharge status: Home / Self Care | Payer: Self-pay | Source: Ambulatory Visit | Attending: Surgery

## 2017-01-21 ENCOUNTER — Encounter: Payer: Self-pay | Admitting: *Deleted

## 2017-01-21 ENCOUNTER — Ambulatory Visit
Admission: RE | Admit: 2017-01-21 | Discharge: 2017-01-21 | Disposition: A | Discharge status: Home / Self Care | Payer: BLUE CROSS/BLUE SHIELD | Source: Ambulatory Visit | Attending: Surgery | Admitting: Surgery

## 2017-01-21 ENCOUNTER — Ambulatory Visit: Payer: BLUE CROSS/BLUE SHIELD | Admitting: Anesthesiology

## 2017-01-21 DIAGNOSIS — F17210 Nicotine dependence, cigarettes, uncomplicated: Secondary | ICD-10-CM | POA: Insufficient documentation

## 2017-01-21 DIAGNOSIS — Z79899 Other long term (current) drug therapy: Secondary | ICD-10-CM | POA: Diagnosis not present

## 2017-01-21 DIAGNOSIS — E785 Hyperlipidemia, unspecified: Secondary | ICD-10-CM | POA: Insufficient documentation

## 2017-01-21 DIAGNOSIS — K219 Gastro-esophageal reflux disease without esophagitis: Secondary | ICD-10-CM | POA: Insufficient documentation

## 2017-01-21 DIAGNOSIS — K429 Umbilical hernia without obstruction or gangrene: Secondary | ICD-10-CM | POA: Diagnosis not present

## 2017-01-21 DIAGNOSIS — G43909 Migraine, unspecified, not intractable, without status migrainosus: Secondary | ICD-10-CM | POA: Diagnosis not present

## 2017-01-21 DIAGNOSIS — I10 Essential (primary) hypertension: Secondary | ICD-10-CM | POA: Insufficient documentation

## 2017-01-21 DIAGNOSIS — K4020 Bilateral inguinal hernia, without obstruction or gangrene, not specified as recurrent: Secondary | ICD-10-CM

## 2017-01-21 HISTORY — PX: UMBILICAL HERNIA REPAIR: SHX196

## 2017-01-21 HISTORY — PX: INGUINAL HERNIA REPAIR: SHX194

## 2017-01-21 SURGERY — REPAIR, HERNIA, INGUINAL, BILATERAL, LAPAROSCOPIC
Anesthesia: General | Site: Inguinal | Wound class: Clean

## 2017-01-21 MED ORDER — FENTANYL CITRATE (PF) 100 MCG/2ML IJ SOLN
25.0000 ug | INTRAMUSCULAR | Status: DC | PRN
  Administered 2017-01-21 (×2): 50 ug via INTRAVENOUS

## 2017-01-21 MED ORDER — BUPIVACAINE-EPINEPHRINE (PF) 0.25% -1:200000 IJ SOLN
INTRAMUSCULAR | Status: AC
  Filled 2017-01-21: qty 30

## 2017-01-21 MED ORDER — ONDANSETRON HCL 4 MG/2ML IJ SOLN
INTRAMUSCULAR | Status: AC
  Filled 2017-01-21: qty 2

## 2017-01-21 MED ORDER — LACTATED RINGERS IV SOLN
INTRAVENOUS | Status: DC
  Administered 2017-01-21 (×2): via INTRAVENOUS

## 2017-01-21 MED ORDER — ACETAMINOPHEN 500 MG PO TABS
ORAL_TABLET | ORAL | Status: AC
  Filled 2017-01-21: qty 2

## 2017-01-21 MED ORDER — FENTANYL CITRATE (PF) 100 MCG/2ML IJ SOLN
INTRAMUSCULAR | Status: AC
  Filled 2017-01-21: qty 2

## 2017-01-21 MED ORDER — FENTANYL CITRATE (PF) 100 MCG/2ML IJ SOLN
INTRAMUSCULAR | Status: DC | PRN
  Administered 2017-01-21: 50 ug via INTRAVENOUS
  Administered 2017-01-21: 100 ug via INTRAVENOUS

## 2017-01-21 MED ORDER — BUPIVACAINE-EPINEPHRINE (PF) 0.25% -1:200000 IJ SOLN
INTRAMUSCULAR | Status: DC | PRN
  Administered 2017-01-21: 30 mL

## 2017-01-21 MED ORDER — IPRATROPIUM-ALBUTEROL 0.5-2.5 (3) MG/3ML IN SOLN
3.0000 mL | Freq: Once | RESPIRATORY_TRACT | Status: AC
  Administered 2017-01-21: 3 mL via RESPIRATORY_TRACT

## 2017-01-21 MED ORDER — HYDROMORPHONE HCL 1 MG/ML IJ SOLN
0.5000 mg | INTRAMUSCULAR | Status: DC | PRN
  Administered 2017-01-21 (×2): 0.5 mg via INTRAVENOUS

## 2017-01-21 MED ORDER — MIDAZOLAM HCL 2 MG/2ML IJ SOLN
INTRAMUSCULAR | Status: AC
  Filled 2017-01-21: qty 2

## 2017-01-21 MED ORDER — GABAPENTIN 300 MG PO CAPS
300.0000 mg | ORAL_CAPSULE | ORAL | Status: AC
  Administered 2017-01-21: 300 mg via ORAL

## 2017-01-21 MED ORDER — OXYCODONE HCL 5 MG PO TABS: 5.0000 mg | ORAL_TABLET | Freq: Once | ORAL | Status: DC | PRN

## 2017-01-21 MED ORDER — HYDROMORPHONE HCL 1 MG/ML IJ SOLN
INTRAMUSCULAR | Status: AC
  Filled 2017-01-21: qty 1

## 2017-01-21 MED ORDER — PROPOFOL 10 MG/ML IV BOLUS
INTRAVENOUS | Status: AC
  Filled 2017-01-21: qty 20

## 2017-01-21 MED ORDER — HYDROMORPHONE HCL 1 MG/ML IJ SOLN
INTRAMUSCULAR | Status: AC
  Administered 2017-01-21: 0.5 mg via INTRAVENOUS
  Filled 2017-01-21: qty 1

## 2017-01-21 MED ORDER — DEXAMETHASONE SODIUM PHOSPHATE 10 MG/ML IJ SOLN
INTRAMUSCULAR | Status: AC
  Filled 2017-01-21: qty 1

## 2017-01-21 MED ORDER — ROCURONIUM BROMIDE 100 MG/10ML IV SOLN
INTRAVENOUS | Status: DC | PRN
  Administered 2017-01-21: 50 mg via INTRAVENOUS

## 2017-01-21 MED ORDER — CELECOXIB 200 MG PO CAPS
400.0000 mg | ORAL_CAPSULE | ORAL | Status: AC
  Administered 2017-01-21: 400 mg via ORAL

## 2017-01-21 MED ORDER — CELECOXIB 200 MG PO CAPS
ORAL_CAPSULE | ORAL | Status: AC
  Filled 2017-01-21: qty 2

## 2017-01-21 MED ORDER — CHLORHEXIDINE GLUCONATE CLOTH 2 % EX PADS
6.0000 | MEDICATED_PAD | Freq: Once | CUTANEOUS | Status: AC
  Administered 2017-01-21: 6 via TOPICAL

## 2017-01-21 MED ORDER — CEFAZOLIN SODIUM-DEXTROSE 2-4 GM/100ML-% IV SOLN
INTRAVENOUS | Status: AC
  Filled 2017-01-21: qty 100

## 2017-01-21 MED ORDER — IPRATROPIUM-ALBUTEROL 0.5-2.5 (3) MG/3ML IN SOLN
RESPIRATORY_TRACT | Status: DC
Start: 2017-01-21 — End: 2017-01-21
  Filled 2017-01-21: qty 3

## 2017-01-21 MED ORDER — ONDANSETRON HCL 4 MG/2ML IJ SOLN
4.0000 mg | Freq: Once | INTRAMUSCULAR | Status: AC
  Administered 2017-01-21: 4 mg via INTRAVENOUS

## 2017-01-21 MED ORDER — CHLORHEXIDINE GLUCONATE CLOTH 2 % EX PADS: 6.0000 | MEDICATED_PAD | Freq: Once | CUTANEOUS | Status: DC

## 2017-01-21 MED ORDER — DEXAMETHASONE SODIUM PHOSPHATE 10 MG/ML IJ SOLN
INTRAMUSCULAR | Status: DC | PRN
  Administered 2017-01-21: 5 mg via INTRAVENOUS

## 2017-01-21 MED ORDER — FAMOTIDINE 20 MG PO TABS
20.0000 mg | ORAL_TABLET | Freq: Once | ORAL | Status: AC
  Administered 2017-01-21: 20 mg via ORAL

## 2017-01-21 MED ORDER — HYDROMORPHONE HCL 1 MG/ML IJ SOLN
INTRAMUSCULAR | Status: DC | PRN
  Administered 2017-01-21: .4 mg via INTRAVENOUS

## 2017-01-21 MED ORDER — FENTANYL CITRATE (PF) 100 MCG/2ML IJ SOLN
INTRAMUSCULAR | Status: AC
  Administered 2017-01-21: 50 ug via INTRAVENOUS
  Filled 2017-01-21: qty 2

## 2017-01-21 MED ORDER — MIDAZOLAM HCL 2 MG/2ML IJ SOLN
INTRAMUSCULAR | Status: DC | PRN
  Administered 2017-01-21: 2 mg via INTRAVENOUS

## 2017-01-21 MED ORDER — OXYCODONE HCL 5 MG/5ML PO SOLN: 5.0000 mg | Freq: Once | ORAL | Status: DC | PRN

## 2017-01-21 MED ORDER — ACETAMINOPHEN 10 MG/ML IV SOLN
INTRAVENOUS | Status: DC | PRN
  Administered 2017-01-21: 1000 mg via INTRAVENOUS

## 2017-01-21 MED ORDER — SUGAMMADEX SODIUM 500 MG/5ML IV SOLN
INTRAVENOUS | Status: DC | PRN
  Administered 2017-01-21: 300 mg via INTRAVENOUS

## 2017-01-21 MED ORDER — GABAPENTIN 300 MG PO CAPS
ORAL_CAPSULE | ORAL | Status: AC
  Filled 2017-01-21: qty 1

## 2017-01-21 MED ORDER — FENTANYL CITRATE (PF) 100 MCG/2ML IJ SOLN
INTRAMUSCULAR | Status: AC
Start: 2017-01-21 — End: 2017-01-21
  Filled 2017-01-21: qty 2

## 2017-01-21 MED ORDER — KETOROLAC TROMETHAMINE 30 MG/ML IJ SOLN
INTRAMUSCULAR | Status: DC | PRN
  Administered 2017-01-21: 30 mg via INTRAVENOUS

## 2017-01-21 MED ORDER — PROPOFOL 10 MG/ML IV BOLUS
INTRAVENOUS | Status: DC | PRN
  Administered 2017-01-21: 25 mg via INTRAVENOUS
  Administered 2017-01-21: 180 mg via INTRAVENOUS
  Administered 2017-01-21 (×2): 25 mg via INTRAVENOUS

## 2017-01-21 MED ORDER — HYDROCODONE-ACETAMINOPHEN 5-325 MG PO TABS
ORAL_TABLET | ORAL | Status: AC
  Administered 2017-01-21: 1 via ORAL
  Filled 2017-01-21: qty 1

## 2017-01-21 MED ORDER — HYDROCODONE-ACETAMINOPHEN 5-325 MG PO TABS
1.0000 | ORAL_TABLET | Freq: Four times a day (QID) | ORAL | Status: DC | PRN
  Administered 2017-01-21: 1 via ORAL

## 2017-01-21 MED ORDER — ACETAMINOPHEN 500 MG PO TABS
1000.0000 mg | ORAL_TABLET | ORAL | Status: AC
  Administered 2017-01-21: 1000 mg via ORAL

## 2017-01-21 MED ORDER — LIDOCAINE HCL (CARDIAC) 20 MG/ML IV SOLN
INTRAVENOUS | Status: DC | PRN
  Administered 2017-01-21: 100 mg via INTRAVENOUS

## 2017-01-21 MED ORDER — FAMOTIDINE 20 MG PO TABS
ORAL_TABLET | ORAL | Status: AC
  Filled 2017-01-21: qty 1

## 2017-01-21 MED ORDER — HYDROCODONE-ACETAMINOPHEN 5-325 MG PO TABS: 1.0000 | ORAL_TABLET | Freq: Four times a day (QID) | ORAL | 0 refills | Status: DC | PRN

## 2017-01-21 SURGICAL SUPPLY — 48 items
APPLICATOR COTTON TIP 6IN STRL (MISCELLANEOUS) IMPLANT
APPLIER CLIP 11 MED OPEN (CLIP) ×3
BLADE CLIPPER SURG (BLADE) ×3 IMPLANT
CANISTER SUCT 1200ML W/VALVE (MISCELLANEOUS) ×3 IMPLANT
CHLORAPREP W/TINT 26ML (MISCELLANEOUS) ×3 IMPLANT
CLIP APPLIE 11 MED OPEN (CLIP) ×2 IMPLANT
DEFOGGER SCOPE WARMER CLEARIFY (MISCELLANEOUS) ×3 IMPLANT
DERMABOND ADVANCED (GAUZE/BANDAGES/DRESSINGS) ×1
DERMABOND ADVANCED .7 DNX12 (GAUZE/BANDAGES/DRESSINGS) ×2 IMPLANT
DEVICE SECURE STRAP 25 ABSORB (INSTRUMENTS) ×3 IMPLANT
DISSECT BALLN SPACEMKR OVL PDB (BALLOONS) ×3
DISSECTOR BALLN SPCMKR OVL PDB (BALLOONS) ×2 IMPLANT
DISSECTOR KITTNER STICK (MISCELLANEOUS) ×4 IMPLANT
DISSECTORS/KITTNER STICK (MISCELLANEOUS) ×6
DRAPE INCISE IOBAN 66X45 STRL (DRAPES) ×3 IMPLANT
DRAPE PED LAPAROTOMY (DRAPES) ×3 IMPLANT
DRSG TELFA 3X8 NADH (GAUZE/BANDAGES/DRESSINGS) ×3 IMPLANT
ELECT REM PT RETURN 9FT ADLT (ELECTROSURGICAL) ×3
ELECTRODE REM PT RTRN 9FT ADLT (ELECTROSURGICAL) ×2 IMPLANT
GLOVE BIO SURGEON STRL SZ7 (GLOVE) ×9 IMPLANT
GOWN STRL REUS W/ TWL LRG LVL3 (GOWN DISPOSABLE) ×4 IMPLANT
GOWN STRL REUS W/TWL LRG LVL3 (GOWN DISPOSABLE) ×2
IRRIGATION STRYKERFLOW (MISCELLANEOUS) IMPLANT
IRRIGATOR STRYKERFLOW (MISCELLANEOUS)
IV NS 1000ML (IV SOLUTION) ×1
IV NS 1000ML BAXH (IV SOLUTION) ×2 IMPLANT
L-HOOK LAP DISP 36CM (ELECTROSURGICAL)
LHOOK LAP DISP 36CM (ELECTROSURGICAL) IMPLANT
MESH 3DMAX 3X5 LT MED (Mesh General) ×3 IMPLANT
MESH 3DMAX 3X5 RT MED (Mesh General) ×3 IMPLANT
NEEDLE HYPO 22GX1.5 SAFETY (NEEDLE) ×3 IMPLANT
NS IRRIG 500ML POUR BTL (IV SOLUTION) ×3 IMPLANT
PACK BASIN MINOR ARMC (MISCELLANEOUS) ×3 IMPLANT
PACK LAP CHOLECYSTECTOMY (MISCELLANEOUS) ×3 IMPLANT
PENCIL ELECTRO HAND CTR (MISCELLANEOUS) ×3 IMPLANT
SCISSORS METZENBAUM CVD 33 (INSTRUMENTS) IMPLANT
SLEEVE PROTECTION STRL DISP (MISCELLANEOUS) ×3 IMPLANT
SPONGE LAP 18X18 5 PK (GAUZE/BANDAGES/DRESSINGS) IMPLANT
SURGILUBE 2OZ TUBE FLIPTOP (MISCELLANEOUS) ×3 IMPLANT
SUT ETHIBOND 0 MO6 C/R (SUTURE) ×3 IMPLANT
SUT ETHIBOND NAB MO 7 #0 18IN (SUTURE) IMPLANT
SUT MNCRL AB 4-0 PS2 18 (SUTURE) ×3 IMPLANT
SUT VICRYL 0 AB UR-6 (SUTURE) ×6 IMPLANT
SYR 20CC LL (SYRINGE) ×3 IMPLANT
TRAY FOLEY CATH SILVER 16FR LF (SET/KITS/TRAYS/PACK) IMPLANT
TROCAR 5MM SINGLE VERSAONE (TROCAR) ×6 IMPLANT
TROCAR BALLN 10M OMST10SB SPAC (TROCAR) ×3 IMPLANT
TUBING INSUFFLATOR HI FLOW (MISCELLANEOUS) ×3 IMPLANT

## 2017-01-21 NOTE — H&P (View-Only) (Signed)
Outpatient Surgical Follow Up  12/24/2016  Keith Barber is an 46 y.o. male.   Chief Complaint  Patient presents with  . Follow-up    Right Lower Quadrant Pain CT Scan 12/09/2016-Dr Marco Raper    HPI: [name]'s following up for symptomatic hernias. He has some intermittent abdominal pain around the periumbilical area and right and left lower quadrant. Pain is intermittent is moderate in intensity. Apparently the pain worsens when he lifts heavy stuff. I have ordered a CT scan that have personally reviewed. There is evidence of bilateral inguinal hernia and umbilical hernia. There is no evidence of any other surgical intervention I'll pathology. The liver does have some steatosis.  no evidence of ascites or cirrhosis.  Past Medical History:  Diagnosis Date  . Hyperlipidemia   . Hypertension   . Migraines     Past Surgical History:  Procedure Laterality Date  . KNEE SURGERY Left   . SHOULDER SURGERY Right     Family History  Problem Relation Age of Onset  . Diabetes Mother   . Migraines Mother   . Cancer Neg Hx   . COPD Neg Hx   . Heart disease Neg Hx   . Stroke Neg Hx     Social History:  reports that he has been smoking Cigarettes.  He has been smoking about 0.50 packs per day. He has never used smokeless tobacco. He reports that he does not drink alcohol or use drugs.  Allergies: No Known Allergies  Medications reviewed.    ROS Full ROS performed and is otherwise negative other than what is stated in HPI   BP (!) 133/94   Pulse 79   Temp 98.1 F (36.7 C) (Oral)   Ht 5\' 7"  (1.702 m)   Wt 90.9 kg (200 lb 6.4 oz)   BMI 31.39 kg/m   Physical Exam  Constitutional: He is oriented to person, place, and time and well-developed, well-nourished, and in no distress. No distress.  Neck: No JVD present. No tracheal deviation present.  Cardiovascular: Normal rate, regular rhythm and intact distal pulses.   Pulmonary/Chest: Effort normal and breath sounds normal. No stridor. He  exhibits no tenderness.  Abdominal: Soft. He exhibits no distension. There is no tenderness. There is no rebound and no guarding.  Reducible UH and reducible BIH. Right larger than left  Musculoskeletal: Normal range of motion. He exhibits no edema.  Neurological: He is alert and oriented to person, place, and time. Gait normal. GCS score is 15.  Skin: Skin is warm and dry. He is not diaphoretic.  Psychiatric: Mood, memory, affect and judgment normal.  Nursing note and vitals reviewed.   Assessment/Plan:  1. Non-recurrent bilateral inguinal hernia without obstruction or gangrene Cousins with patient in detail about his disease process. He wishes to undergo surgical attention. We will go ahead and schedule him for bilateral laparoscopic repair of inguinal hernias as well as an open book hernia repair. I have explained the procedure, risks, and aftercare of inguinal hernia repair to [name].   Risks include but are not limited to bleeding, infection, wound problems, anesthesia, recurrence, bladder or intestine injury, urinary retention, testicular dysfunction, chronic pain, mesh problems.  He  seems to understand and agrees to proceed.  Questions were answered to his stated satisfaction. I spent 40 minutes in this encounter with greater than 50% of the time spent in coordinating/counseling of care.     Caroleen Hamman, MD [facility] General Surgeon

## 2017-01-21 NOTE — Anesthesia Post-op Follow-up Note (Cosign Needed)
Anesthesia QCDR form completed.        

## 2017-01-21 NOTE — Transfer of Care (Signed)
Immediate Anesthesia Transfer of Care Note  Patient: Keith Barber  Procedure(s) Performed: Procedure(s): LAPAROSCOPIC BILATERAL INGUINAL HERNIA REPAIR (Bilateral) HERNIA REPAIR UMBILICAL ADULT (N/A)  Patient Location: PACU  Anesthesia Type:General  Level of Consciousness: sedated  Airway & Oxygen Therapy: Patient Spontanous Breathing and Patient connected to nasal cannula oxygen  Post-op Assessment: Report given to RN and Post -op Vital signs reviewed and stable  Post vital signs: Reviewed and stable  Last Vitals:  Vitals:   01/21/17 1058 01/21/17 1113  BP: (!) 87/63 96/75  Pulse: 83 99  Resp: 10 (!) 21  Temp:      Last Pain:  Vitals:   01/21/17 1113  TempSrc:   PainSc: 8          Complications: No apparent anesthesia complications

## 2017-01-21 NOTE — Op Note (Signed)
Laparoscopic Bilateral Inguinal Hernia Repair and Umbilical hernia repair  Keith Barber  01/21/2017  Pre-operative Diagnosis: Bilateral Inguinal Hernia  Post-operative Diagnosis: Bilateral  Inguinal hernia  Procedure:  1.Laparoscopic preperitoneal repair of bilateral inguinal hernias w 3 D mesh 2. Open repair Umbilical hernia  Surgeon: Caroleen Hamman, MD FACS  Anesthesia: Gen. with endotracheal tube   Findings: Bilateral Direct IH  Procedure Details  The patient was seen again in the Holding Room. The benefits, complications, treatment options, and expected outcomes were discussed with the patient. The risks of bleeding, infection, recurrence of symptoms, failure to resolve symptoms, recurrence of hernia, ischemic orchitis, chronic pain syndrome or neuroma, were discussed again. The likelihood of improving the patient's symptoms with return to their baseline status is good.  The patient and/or family concurred with the proposed plan, giving informed consent.  The patient was taken to Operating Room, identified as Keith Barber and the procedure verified as Laparoscopic Inguinal Hernia Repair. Laterality confirmed.  A Time Out was held and the above information confirmed.  Prior to the induction of general anesthesia, antibiotic prophylaxis was administered. VTE prophylaxis was in place. General endotracheal anesthesia was then administered and tolerated well. After the induction, the abdomen was prepped with Chloraprep and draped in the sterile fashion. The patient was positioned in the supine position.  Local anesthetic  was injected into the skin near the umbilicus and an incision made. An incision was made and dissection down to the rectus fascia was performed. We visualized the umbilical hernia and dissected the sac and excised it. We repaired the defect using interrupted 0 ethibond sutures.  The fascia was incised away from the hernia defect and the muscle retracted laterally. The  Covidien dissecting balloon was placed followed by the structural balloon. The preperitoneal space was insufflated and under direct vision 2 midline 5 mm ports were placed.  Dissection was performed to delineate Cooper's ligament and the lateral extent of dissection was determined on each side. The nerve on the lateral abdominal wall was identified and kept in view at all times. The cord was skeletonized of the indirect sac and cord lipoma which was retracted cephalad on each side.   Once this was complete, a BARD 3 D mesh was placed into the preperitoneal space on each side. They were held in place with the absorbable tacking device avoiding the area of the nerve. Once assuring that the hernias were completely repaired and adequately covered, the preperitoneal space was desufflated under direct vision. There was no sign of peritoneal rent and no sign of bowel intrusion towards the mesh.  Once assuring that hemostasis was adequate the ports were removed and a figure-of-eight 0 Vicryl suture was placed at the fascial edges. 4-0 subcuticular Monocryl was used at all skin edges. Dermabond was placed.  Patient tolerated the procedure well. There were no complications. He was taken to the recovery room in stable condition.               Caroleen Hamman, MD, FACS

## 2017-01-21 NOTE — Discharge Instructions (Signed)
AMBULATORY SURGERY  DISCHARGE INSTRUCTIONS   1) The drugs that you were given will stay in your system until tomorrow so for the next 24 hours you should not:  A) Drive an automobile B) Make any legal decisions C) Drink any alcoholic beverage   2) You may resume regular meals tomorrow.  Today it is better to start with liquids and gradually work up to solid foods.  You may eat anything you prefer, but it is better to start with liquids, then soup and crackers, and gradually work up to solid foods.   3) Please notify your doctor immediately if you have any unusual bleeding, trouble breathing, redness and pain at the surgery site, drainage, fever, or pain not relieved by medication.    4) Additional Instructions:Splint abdomen when moving, sitting, coughing, sneezing and laughing.  Take stool softeners along with pain medicine.  You do not want to strain with a bowel movement. Drink lots of fluids.  Make sure you have solid food in your stomach before taking pain medicine.   Please contact your physician with any problems or Same Day Surgery at [phone], Monday through Friday 6 am to 4 pm, or [place] at [facility] number at [phone].

## 2017-01-21 NOTE — Anesthesia Preprocedure Evaluation (Addendum)
Anesthesia Evaluation  Patient identified by MRN, date of birth, ID band Patient awake    Reviewed: Allergy & Precautions, H&P , NPO status , Patient's Chart, lab work & pertinent test results  History of Anesthesia Complications Negative for: history of anesthetic complications  Airway Mallampati: III  TM Distance: <3 FB Neck ROM: full    Dental  (+) Poor Dentition, Chipped   Pulmonary neg shortness of breath, former smoker,           Cardiovascular Exercise Tolerance: Good hypertension, (-) angina(-) Past MI and (-) DOE      Neuro/Psych  Headaches, negative psych ROS   GI/Hepatic Neg liver ROS, GERD  Controlled and Medicated,  Endo/Other  negative endocrine ROS  Renal/GU      Musculoskeletal  (+) Arthritis ,   Abdominal   Peds  Hematology negative hematology ROS (+)   Anesthesia Other Findings Past Medical History: No date: Bone spur     Comment: LEFT SHOULDER THAT MAKES HIS LEFT ARM TINGLE No date: GERD (gastroesophageal reflux disease) No date: Hyperlipidemia No date: Hypertension No date: Migraines  Past Surgical History: No date: KNEE SURGERY Left No date: SHOULDER SURGERY Right  BMI    Body Mass Index:  30.26 kg/m      Reproductive/Obstetrics negative OB ROS                            Anesthesia Physical Anesthesia Plan  ASA: III  Anesthesia Plan: General ETT   Post-op Pain Management:    Induction: Intravenous  PONV Risk Score and Plan: 2 and Ondansetron and Dexamethasone  Airway Management Planned: Oral ETT  Additional Equipment:   Intra-op Plan:   Post-operative Plan: Extubation in OR  Informed Consent: I have reviewed the patients History and Physical, chart, labs and discussed the procedure including the risks, benefits and alternatives for the proposed anesthesia with the patient or authorized representative who has indicated his/her understanding  and acceptance.   Dental Advisory Given  Plan Discussed with: Anesthesiologist, CRNA and Surgeon  Anesthesia Plan Comments: (Patient consented for risks of anesthesia including but not limited to:  - adverse reactions to medications - damage to teeth, lips or other oral mucosa - sore throat or hoarseness - Damage to heart, brain, lungs or loss of life  Patient voiced understanding.)        Anesthesia Quick Evaluation

## 2017-01-21 NOTE — Interval H&P Note (Signed)
History and Physical Interval Note:  01/21/2017 8:12 AM  Keith Barber  has presented today for surgery, with the diagnosis of Bilateral inguinal hernias  The various methods of treatment have been discussed with the patient and family. After consideration of risks, benefits and other options for treatment, the patient has consented to  Procedure(s): [place] (Bilateral) HERNIA REPAIR UMBILICAL ADULT (N/A) as a surgical intervention .  The patient's history has been reviewed, patient examined, no change in status, stable for surgery.  I have reviewed the patient's chart and labs.  Questions were answered to the patient's satisfaction.     [name]

## 2017-01-21 NOTE — Anesthesia Procedure Notes (Signed)
Procedure Name: Intubation Date/Time: 01/21/2017 9:34 AM Performed by: [facility] Pre-anesthesia Checklist: Patient identified, Patient being monitored, Timeout performed, Emergency Drugs available and Suction available Patient Re-evaluated:Patient Re-evaluated prior to inductionOxygen Delivery Method: Circle system utilized Preoxygenation: Pre-oxygenation with 100% oxygen Intubation Type: IV induction Ventilation: Mask ventilation without difficulty Laryngoscope Size: Mac and 3 Grade View: Grade I Tube type: Oral Laser Tube: Cuffed inflated with minimal occlusive pressure - saline Tube size: 7.0 mm Number of attempts: 1 Airway Equipment and Method: Stylet Placement Confirmation: ETT inserted through vocal cords under direct vision,  positive ETCO2 and breath sounds checked- equal and bilateral Secured at: 21 cm Tube secured with: Tape Dental Injury: Teeth and Oropharynx as per pre-operative assessment

## 2017-01-24 LAB — SURGICAL PATHOLOGY

## 2017-01-24 NOTE — Anesthesia Postprocedure Evaluation (Signed)
Anesthesia Post Note  Patient: Keith Barber  Procedure(s) Performed: Procedure(s) (LRB): LAPAROSCOPIC BILATERAL INGUINAL HERNIA REPAIR (Bilateral) HERNIA REPAIR UMBILICAL ADULT (N/A)  Patient location during evaluation: PACU Anesthesia Type: General Level of consciousness: awake and alert Pain management: pain level controlled Vital Signs Assessment: post-procedure vital signs reviewed and stable Respiratory status: spontaneous breathing, nonlabored ventilation, respiratory function stable and patient connected to nasal cannula oxygen Cardiovascular status: blood pressure returned to baseline and stable Postop Assessment: no signs of nausea or vomiting Anesthetic complications: no     Last Vitals:  Vitals:   01/21/17 1202 01/21/17 1333  BP: 102/74 112/64  Pulse: 93 86  Resp: 20 16  Temp: (!) 36.1 C     Last Pain:  Vitals:   01/21/17 1333  TempSrc:   PainSc: 4                  Sharlize Hoar K Bryna Razavi

## 2017-01-25 ENCOUNTER — Encounter: Payer: Self-pay | Admitting: Surgery

## 2017-01-25 ENCOUNTER — Telehealth: Payer: Self-pay | Admitting: General Practice

## 2017-01-25 ENCOUNTER — Ambulatory Visit (INDEPENDENT_AMBULATORY_CARE_PROVIDER_SITE_OTHER): Payer: BLUE CROSS/BLUE SHIELD | Admitting: Surgery

## 2017-01-25 ENCOUNTER — Telehealth: Payer: Self-pay | Admitting: *Deleted

## 2017-01-25 VITALS — BP 135/92 | HR 94 | Temp 98.4°F | Ht 67.0 in | Wt 195.0 lb

## 2017-01-25 DIAGNOSIS — K4020 Bilateral inguinal hernia, without obstruction or gangrene, not specified as recurrent: Secondary | ICD-10-CM

## 2017-01-25 MED ORDER — HYDROCODONE-ACETAMINOPHEN 5-325 MG PO TABS: 1.0000 | ORAL_TABLET | Freq: Four times a day (QID) | ORAL | 0 refills | Status: DC | PRN

## 2017-01-25 MED ORDER — CEPHALEXIN 500 MG PO CAPS: 500.0000 mg | ORAL_CAPSULE | Freq: Four times a day (QID) | ORAL | 0 refills | Status: DC

## 2017-01-25 NOTE — Addendum Note (Signed)
Addended by: Wayna Chalet on: 01/25/2017 01:59 PM   Modules accepted: Orders

## 2017-01-25 NOTE — Progress Notes (Signed)
Outpatient postop visit  01/25/2017  Keith Barber is an 46 y.o. male.    Procedure: Laparoscopic BIH repair and UH repair  CC: Bilateral groin pain  HPI: Patient had a laparoscopic preperitoneal repair of bilateral inguinal hernias and umbilical herniorrhaphy 4 days ago by Dr. Dahlia Byes. He called in stating that he was having increased pain and wanted to be seen. He had run out of his Vicodin this morning. He denies fevers or chills is eating and having normal bowel movements but points to both groins in the area of the mesh as the site of his pain. He has minimal testicular pain.  Medications reviewed.    Physical Exam:  BP (!) 135/92   Pulse 94   Temp 98.4 F (36.9 C) (Oral)   Ht 5\' 7"  (1.702 m)   Wt 195 lb (88.5 kg)   BMI 30.54 kg/m     PE: On first glance the abdomen appears distended and tympanitic there is erythema as well as early bruising around an area approximately 6 x 6 cm in the infraumbilical area including the umbilical incision. There is some edema to the skin and some blanching. No fluctuance and only minimal tenderness.  Bilateral groin tenderness without erythema along the course of the cords bilaterally. The testicles are only minimal tender nO achymosis in the scrotum.    Assessment/Plan:  This a patient with what appears to be either an early cellulitis or an early percolating bruise around the infraumbilical area my concern is that this could be a cellulitis. The pain that he is complaining of however is in bilateral groins along the course of the testicular cords not including the testicle. This pain seems to be out of proportion to what I would expect after bilateral inguinal hernia repairs using the 3-D Max mesh. Does not appear to be bilateral nerve entrapment either. I am not sure what is causing this patient's considerable groin pain. However with the thought that he could have an early cellulitis versus an early bruise I suggested that he come in the  hospital for institution of prompt IV antibiotics to see if this makes any difference over the first 24-36 hours. 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 Since he does not want to come in the hospital I offered oral antibiotics. I will start him on Keflex 500 mg 4 times a day as he is not allergic. I will refill his Vicodin within state prescribing rules at this point I've asked him to follow-up either on Thursday or Friday with progress. We will see him in the office on Friday.  Florene Glen, MD, FACS

## 2017-01-25 NOTE — Patient Instructions (Addendum)
Please start taking the antibiotic.   We will see you back on Friday to make sure that you are doing better.  Please give [place] a call in case you start to feel worse.

## 2017-01-25 NOTE — Telephone Encounter (Signed)
Patient is calling he had surgery on 01/21/17 he had a laparoscopic bilateral inguinal hernia repair/hernia repair umbilical adult with Dr. Dahlia Byes. He is complaining of having pain coming from one of the incisions across the top of the belly button and right underneath the belly hurting more toward the left side and in the growing area, pain level being at a 4 or 5 when he takes his pain medications without the medications it's at a level 8-10, patient denies vomiting, but has felt nausea late Sunday afternoon, nothing since then. Please call patient and advice.

## 2017-01-25 NOTE — Telephone Encounter (Signed)
Patient states he has increased pain around the incision site ( groin area) with swelling. He has redness and feels hot to touch. He denies fever at this time. Patient added to Dr.Cooper's schedule today @ 1:45 pm.

## 2017-01-25 NOTE — Telephone Encounter (Signed)
Patient states that he is out of pain medication, he continues to have pain and swelling with new areas of swelling to the groin that had not previously been present.  Using icepacks without relief.  Instructed by Sylvester Harder RN to contact Dr. Corlis Leak office, telephone number given to patient, stated he would do so after this call completed.

## 2017-01-26 ENCOUNTER — Other Ambulatory Visit: Payer: Self-pay

## 2017-01-28 ENCOUNTER — Encounter: Payer: Self-pay | Admitting: Surgery

## 2017-01-28 ENCOUNTER — Ambulatory Visit (INDEPENDENT_AMBULATORY_CARE_PROVIDER_SITE_OTHER): Payer: BLUE CROSS/BLUE SHIELD | Admitting: Surgery

## 2017-01-28 VITALS — BP 108/72 | HR 83 | Temp 98.1°F | Ht 67.0 in | Wt 191.8 lb

## 2017-01-28 DIAGNOSIS — Z09 Encounter for follow-up examination after completed treatment for conditions other than malignant neoplasm: Secondary | ICD-10-CM

## 2017-01-28 NOTE — Progress Notes (Signed)
S/p BIH UH Came earlier this week for pain and because he run out of his pain meds No fevers or chills Pain much better  PE NAD Abd: soft, incisions c/d/i, resolving ecchymosis on periumbilical area, no evidence of infection or necrosis  A/p IMproving I do think there may be a component of decrease pain threshold and some narcotic tolerance There seems to be no complications at this time We will follow him in 10 days or so C/p left shoulder pain and wishes to have referral to ortho

## 2017-01-28 NOTE — Patient Instructions (Signed)
Please see your follow up appointment listed below. Please continue to take your Antibiotic. Please be sure to drink plenty of water.

## 2017-02-10 ENCOUNTER — Ambulatory Visit (INDEPENDENT_AMBULATORY_CARE_PROVIDER_SITE_OTHER): Payer: BLUE CROSS/BLUE SHIELD | Admitting: Surgery

## 2017-02-10 ENCOUNTER — Encounter: Payer: Self-pay | Admitting: Surgery

## 2017-02-10 ENCOUNTER — Encounter: Payer: BLUE CROSS/BLUE SHIELD | Admitting: Surgery

## 2017-02-10 VITALS — BP 113/83 | HR 103 | Temp 97.9°F | Wt 189.0 lb

## 2017-02-10 DIAGNOSIS — Z09 Encounter for follow-up examination after completed treatment for conditions other than malignant neoplasm: Secondary | ICD-10-CM

## 2017-02-10 NOTE — Patient Instructions (Addendum)
At this time you are able to submerge in a tub, hot tub, or pool since your incisions are completely sealed.  Use sun block to incision area over the next year if this area will be exposed to sun. This helps decrease scarring.  You may resume your normal activities on 02/11/2017. At that time- Listen to your body when lifting, if you have pain when lifting, stop and then try again in a few days. Soreness after doing exercises or activities of daily living is normal as you get back in to your normal routine.  If you develop redness, drainage, or pain at incision sites- call our office immediately and speak with a nurse.  You could apply ice on your inguinal area to help you with the swelling and pain. You are able to alternate Tylenol 500 MG every 4 hours and Ibuprofen 800 MG every 8 hours as needed for pain.  Please call our office with any questions or concerns that you may have.  We will refer you to see Dr. Sabra Heck for you to be seen for your shoulder. I will call you as soon as I get your appointment scheduled.

## 2017-02-10 NOTE — Progress Notes (Signed)
S/p lap BIH Doing well Significant improvement Taking PO , some occasional pains on left groin  PE NAD Abd : soft, incisions healing well, no infection and no recurrence  A/P Doing well RTC prn

## 2017-03-03 ENCOUNTER — Ambulatory Visit (INDEPENDENT_AMBULATORY_CARE_PROVIDER_SITE_OTHER): Payer: BLUE CROSS/BLUE SHIELD | Admitting: Family Medicine

## 2017-03-03 ENCOUNTER — Ambulatory Visit: Payer: BLUE CROSS/BLUE SHIELD | Admitting: Family Medicine

## 2017-03-03 ENCOUNTER — Telehealth: Payer: Self-pay | Admitting: Family Medicine

## 2017-03-03 ENCOUNTER — Encounter: Payer: Self-pay | Admitting: Family Medicine

## 2017-03-03 VITALS — BP 108/77 | HR 74 | Temp 98.6°F | Wt 188.0 lb

## 2017-03-03 DIAGNOSIS — I10 Essential (primary) hypertension: Secondary | ICD-10-CM | POA: Diagnosis not present

## 2017-03-03 DIAGNOSIS — Z720 Tobacco use: Secondary | ICD-10-CM

## 2017-03-03 DIAGNOSIS — R42 Dizziness and giddiness: Secondary | ICD-10-CM | POA: Diagnosis not present

## 2017-03-03 NOTE — Telephone Encounter (Signed)
Cut the amlodipine in half (only take 5 mg) and call in the next week or two with readings. If he can't find the cuff, I'd like to see him back in 1 month

## 2017-03-03 NOTE — Telephone Encounter (Signed)
Patient notified

## 2017-03-03 NOTE — Telephone Encounter (Signed)
Left message to call.

## 2017-03-03 NOTE — Telephone Encounter (Signed)
Patient would like to know which BP medication needing to be cut down in half. Patient would like to know if he needs to take the medication differently if the dosage has changed.  Patient would also like to know whe amount of time he should call provider in the case he does fid his BP cuff.  Please Advise.  Thank you

## 2017-03-03 NOTE — Progress Notes (Signed)
BP 108/77   Pulse 74   Temp 98.6 F (37 C)   Wt 188 lb (85.3 kg)   SpO2 97%   BMI 29.44 kg/m    Subjective:    Patient ID: Keith Barber, male    DOB: [birth year], 46 y.o.   MRN: [id number]  HPI: Keith Barber is a 46 y.o. male  Chief Complaint  Patient presents with  . Dizziness    x 2 weeks ago, feels off balance. He wonders if it is from his BP meds. He did drink alot of mtn dew before and has quit smoking in the last few weeks.   1.5 weeks of intermittent dizziness, worst in the mornings. Dizzy spells lasting only a few seconds at a time. Denies N/V/D, fevers, syncope, HA, visual changes, ear pain. Has not been trying anything OTC. Notes he's been through a lot of major lifestyle changes the past few months. Started the zyban 7 weeks ago, quit 6 weeks ago prior to his hernia surgery. Still taking the zyban, just once daily to keep cravings away. Also cutting back on mountain dew, drinking 1 bottle every 2-3 days rather than several daily. Has lost 13 lb the past few months.   Past Medical History:  Diagnosis Date  . Bone spur    LEFT SHOULDER THAT MAKES HIS LEFT ARM TINGLE  . GERD (gastroesophageal reflux disease)   . Hyperlipidemia   . Hypertension   . Migraines    Social History   Social History  . Marital status: Divorced    Spouse name: N/A  . Number of children: N/A  . Years of education: N/A   Occupational History  . Not on file.   Social History Main Topics  . Smoking status: Former Smoker    Packs/day: 0.50    Years: 26.00    Types: Cigarettes    Quit date: 01/13/2017  . Smokeless tobacco: Never Used     Comment: PT STARTED TAKING ZYBAN ON 01-08-17 IN HOPES OF QUITTING  . Alcohol use No  . Drug use: No  . Sexual activity: Not on file   Other Topics Concern  . Not on file   Social History Narrative  . No narrative on file    Relevant past medical, surgical, family and social history reviewed and updated as indicated. Interim medical history since  our last visit reviewed. Allergies and medications reviewed and updated.  Review of Systems  Constitutional: Negative.   HENT: Negative.   Respiratory: Negative.   Cardiovascular: Negative.   Gastrointestinal: Negative.   Musculoskeletal: Negative.   Neurological: Positive for dizziness.  Psychiatric/Behavioral: Negative.     Per HPI unless specifically indicated above     Objective:    BP 108/77   Pulse 74   Temp 98.6 F (37 C)   Wt 188 lb (85.3 kg)   SpO2 97%   BMI 29.44 kg/m   Wt Readings from Last 3 Encounters:  03/03/17 188 lb (85.3 kg)  02/10/17 189 lb (85.7 kg)  01/28/17 191 lb 12.8 oz (87 kg)    Physical Exam  Constitutional: He is oriented to person, place, and time. He appears well-developed and well-nourished. No distress.  HENT:  Head: Atraumatic.  Eyes: Pupils are equal, round, and reactive to light. Conjunctivae are normal.  Neck: Normal range of motion. Neck supple.  Cardiovascular: Normal rate, regular rhythm and normal heart sounds.   Pulmonary/Chest: Effort normal and breath sounds normal. No respiratory distress.  Musculoskeletal: Normal range of motion.  Neurological: He is alert and oriented to person, place, and time.  Skin: Skin is warm and dry.  Psychiatric: He has a normal mood and affect. His behavior is normal.  Nursing note and vitals reviewed.     Assessment & Plan:   Problem List Items Addressed This Visit      Cardiovascular and Mediastinum   Hypertension    Reduce amlodipine to 5 mg tablet, continue lisinopril 10 mg. Start regular home monitoring and call with recent readings in about 2 weeks.         Other   Tobacco use    Congratulated on cessation, continue zyban for as long as needed to keep cravings down and continue cessation.        Other Visit Diagnoses    Dizziness    -  Primary   Orthostatics WNL. Suspect overtreatment of BP given recent weight loss and significant lifestyle modifications of dietary change,  smoking cessation.        Follow up plan: Return in about 2 weeks (around 03/17/2017) for Phone check in.

## 2017-03-03 NOTE — Telephone Encounter (Signed)
Routing to provider  

## 2017-03-05 NOTE — Assessment & Plan Note (Signed)
Congratulated on cessation, continue zyban for as long as needed to keep cravings down and continue cessation.

## 2017-03-05 NOTE — Assessment & Plan Note (Signed)
Reduce amlodipine to 5 mg tablet, continue lisinopril 10 mg. Start regular home monitoring and call with recent readings in about 2 weeks.

## 2017-03-05 NOTE — Patient Instructions (Signed)
Follow up in 2 weeks by phone

## 2017-03-22 ENCOUNTER — Encounter (INDEPENDENT_AMBULATORY_CARE_PROVIDER_SITE_OTHER): Payer: Self-pay | Admitting: Orthopaedic Surgery

## 2017-03-22 ENCOUNTER — Ambulatory Visit (INDEPENDENT_AMBULATORY_CARE_PROVIDER_SITE_OTHER): Payer: BLUE CROSS/BLUE SHIELD | Admitting: Orthopaedic Surgery

## 2017-03-22 ENCOUNTER — Ambulatory Visit (INDEPENDENT_AMBULATORY_CARE_PROVIDER_SITE_OTHER): Payer: Self-pay

## 2017-03-22 VITALS — BP 114/78 | HR 91 | Ht 69.0 in | Wt 185.0 lb

## 2017-03-22 DIAGNOSIS — G8929 Other chronic pain: Secondary | ICD-10-CM

## 2017-03-22 DIAGNOSIS — M7542 Impingement syndrome of left shoulder: Secondary | ICD-10-CM | POA: Diagnosis not present

## 2017-03-22 DIAGNOSIS — M25512 Pain in left shoulder: Secondary | ICD-10-CM

## 2017-03-22 NOTE — Progress Notes (Signed)
Office Visit Note   Patient: Keith Barber           Date of Birth: [birth year]           MRN: [id number] Visit Date: 03/22/2017              Requested by: Jules Husbands, [address] [place], [place] [address] PCP: Volney American, PA-C   Assessment & Plan: Visit Diagnoses:  1. Impingement syndrome of left shoulder   2. Chronic left shoulder pain     Plan:  #1: MRI scan of left shoulder to rule out rotator cuff tear. He has had a very similar problem on his right shoulder and had undergone a corticosteroid injection which was only beneficial for 3 days. He also has difficulty with the steroid oral meds. He had undergone a subacromial decompression with excellent results at that time on his right shoulder..  In regards to his left shoulder he has tried nonsteroidal anti-inflammatories as well as a home exercise program trying to strengthen his shoulder however he was unable to continue because of pain. He has constant even more pain now.   Follow-Up Instructions: Return in about 2 weeks (around 04/05/2017).   Orders:  Orders Placed This Encounter  Procedures  . XR Shoulder Left  . MR Shoulder Left w/o contrast   No orders of the defined types were placed in this encounter.     Procedures: No procedures performed   Clinical Data: No additional findings.   Subjective: Chief Complaint  Patient presents with  . Left Shoulder - Pain    [name] is a 46 year old white male who is seen today with a history of 8-10 years of left shoulder pain. He has difficulty with sleeping as well as motion of the shoulder which causes him pain. He describes symptoms of impingement type symptoms. He states his symptoms are very similar to his right shoulder which had undergone a subacromial decompression after failure of corticosteroid injection. He does have nighttime pain also. Denies any numbness or tingling or neurovascular symptoms. No cervical radicular type  pain.  In regards to his right shoulder they had proceeded with the corticosteroid injection which only lasted 3 days as well as exercises which failed. Even tried oral steroids but that caused him to have a reaction.    Review of Systems  Gastrointestinal: Positive for constipation, diarrhea and nausea.  Musculoskeletal: Positive for back pain.  Neurological: Positive for dizziness, light-headedness and headaches.     Objective: Vital Signs: BP 114/78   Pulse 91   Ht 5\' 9"  (1.753 m)   Wt 185 lb (83.9 kg)   BMI 27.32 kg/m   Physical Exam  Constitutional: He is oriented to person, place, and time. He appears well-developed and well-nourished.  HENT:  Head: Normocephalic and atraumatic.  Eyes: Pupils are equal, round, and reactive to light. EOM are normal.  Pulmonary/Chest: Effort normal.  Neurological: He is alert and oriented to person, place, and time.  Skin: Skin is warm and dry.  Psychiatric: He has a normal mood and affect. His behavior is normal. Judgment and thought content normal.    Ortho Exam  Today he has range of motion to only about 145 and forward flexion. 100 of abduction. He has about 85 of external rotation with pain at 90 of abduction and 70 of internal rotation with pain. Positive empty can test worsen forward flexion but still symptomatic in abduction. He does have good strength. Full  range of motion cervical spine with no radicular symptoms.  Specialty Comments:  No specialty comments available.  Imaging: Xr Shoulder Left  Result Date: 03/22/2017 Three-view x-ray of the left shoulder reveals a type II acromion with inferior spurring of the distal clavicle with some mild joint space narrowing inferior glenohumeral joint.    PMFS History: Patient Active Problem List   Diagnosis Date Noted  . Umbilical hernia without obstruction and without gangrene   . Bilateral inguinal hernia without obstruction or gangrene   . Tobacco use 11/26/2016  .  Hypertension   . Hyperlipidemia   . Migraines   . Rotator cuff tendinitis, left 11/12/2015  . Tear of left glenoid labrum 11/12/2015   Past Medical History:  Diagnosis Date  . Bone spur    LEFT SHOULDER THAT MAKES HIS LEFT ARM TINGLE  . GERD (gastroesophageal reflux disease)   . Hyperlipidemia   . Hypertension   . Migraines     Family History  Problem Relation Age of Onset  . Diabetes Mother   . Migraines Mother   . Cancer Neg Hx   . COPD Neg Hx   . Heart disease Neg Hx   . Stroke Neg Hx     Past Surgical History:  Procedure Laterality Date  . INGUINAL HERNIA REPAIR Bilateral 01/21/2017   Procedure: LAPAROSCOPIC BILATERAL INGUINAL HERNIA REPAIR;  Surgeon: Jules Husbands, MD;  Location: [facility] ORS;  Service: General;  Laterality: Bilateral;  . KNEE SURGERY Left   . SHOULDER SURGERY Right   . UMBILICAL HERNIA REPAIR N/A 01/21/2017   Procedure: HERNIA REPAIR UMBILICAL ADULT;  Surgeon: Jules Husbands, MD;  Location: [facility] ORS;  Service: General;  Laterality: N/A;   Social History   Occupational History  . Not on file.   Social History Main Topics  . Smoking status: Former Smoker    Packs/day: 0.50    Years: 26.00    Types: Cigarettes    Quit date: 01/13/2017  . Smokeless tobacco: Never Used     Comment: PT STARTED TAKING ZYBAN ON 01-08-17 IN HOPES OF QUITTING  . Alcohol use No  . Drug use: No  . Sexual activity: Not on file

## 2017-04-04 ENCOUNTER — Ambulatory Visit (INDEPENDENT_AMBULATORY_CARE_PROVIDER_SITE_OTHER): Payer: Self-pay | Admitting: Orthopaedic Surgery

## 2017-04-07 ENCOUNTER — Ambulatory Visit
Admission: RE | Admit: 2017-04-07 | Discharge: 2017-04-07 | Disposition: A | Discharge status: Home / Self Care | Payer: BLUE CROSS/BLUE SHIELD | Source: Ambulatory Visit | Attending: Orthopedic Surgery | Admitting: Orthopedic Surgery

## 2017-04-07 ENCOUNTER — Other Ambulatory Visit: Payer: BLUE CROSS/BLUE SHIELD

## 2017-04-11 ENCOUNTER — Encounter: Payer: Self-pay | Admitting: Family Medicine

## 2017-04-11 ENCOUNTER — Ambulatory Visit (INDEPENDENT_AMBULATORY_CARE_PROVIDER_SITE_OTHER): Payer: BLUE CROSS/BLUE SHIELD | Admitting: Family Medicine

## 2017-04-11 ENCOUNTER — Telehealth: Payer: Self-pay | Admitting: Surgery

## 2017-04-11 ENCOUNTER — Encounter: Payer: Self-pay | Admitting: Surgery

## 2017-04-11 ENCOUNTER — Ambulatory Visit (INDEPENDENT_AMBULATORY_CARE_PROVIDER_SITE_OTHER): Payer: BLUE CROSS/BLUE SHIELD | Admitting: Surgery

## 2017-04-11 ENCOUNTER — Telehealth: Payer: Self-pay | Admitting: Family Medicine

## 2017-04-11 VITALS — BP 121/81 | HR 91 | Temp 98.2°F | Ht 69.0 in | Wt 191.2 lb

## 2017-04-11 VITALS — BP 113/75 | HR 85 | Temp 98.3°F | Ht 69.0 in | Wt 190.4 lb

## 2017-04-11 DIAGNOSIS — K644 Residual hemorrhoidal skin tags: Secondary | ICD-10-CM

## 2017-04-11 DIAGNOSIS — K649 Unspecified hemorrhoids: Secondary | ICD-10-CM

## 2017-04-11 MED ORDER — DICYCLOMINE HCL 10 MG PO CAPS: 10.0000 mg | ORAL_CAPSULE | Freq: Three times a day (TID) | ORAL | 1 refills | Status: DC

## 2017-04-11 MED ORDER — HYDROCODONE-ACETAMINOPHEN 5-325 MG PO TABS: 1.0000 | ORAL_TABLET | Freq: Four times a day (QID) | ORAL | 0 refills | Status: DC | PRN

## 2017-04-11 MED ORDER — HYDROCORTISONE 2.5 % RE CREA: 1.0000 "application " | TOPICAL_CREAM | Freq: Two times a day (BID) | RECTAL | 3 refills | Status: DC

## 2017-04-11 NOTE — Patient Instructions (Signed)
You will need to continue Anusol use. Also take pain medication today only as needed for severe pain.  Please follow-up as scheduled below.  Use Metamucil Daily to increase fiber and help to firm up stools.  You may do Sitz baths 2-3 times daily as needed for discomfort.  If you develop any increased pain, bleeding, redness, drainage of pus, or any fever or chills; please call our office immediately so that we can work you in.  Please call our office with any questions or concerns.   Hemorrhoids Hemorrhoids are swollen veins in and around the rectum or anus. There are two types of hemorrhoids:  Internal hemorrhoids. These occur in the veins that are just inside the rectum. They may poke through to the outside and become irritated and painful.  External hemorrhoids. These occur in the veins that are outside of the anus and can be felt as a painful swelling or hard lump near the anus.  Most hemorrhoids do not cause serious problems, and they can be managed with home treatments such as diet and lifestyle changes. If home treatments do not help your symptoms, procedures can be done to shrink or remove the hemorrhoids. What are the causes? This condition is caused by increased pressure in the anal area. This pressure may result from various things, including:  Constipation.  Straining to have a bowel movement.  Diarrhea.  Pregnancy.  Obesity.  Sitting for long periods of time.  Heavy lifting or other activity that causes you to strain.  Anal sex.  What are the signs or symptoms? Symptoms of this condition include:  Pain.  Anal itching or irritation.  Rectal bleeding.  Leakage of stool (feces).  Anal swelling.  One or more lumps around the anus.  How is this diagnosed? This condition can often be diagnosed through a visual exam. Other exams or tests may also be done, such as:  Examination of the rectal area with a gloved hand (digital rectal exam).  Examination  of the anal canal using a small tube (anoscope).  A blood test, if you have lost a significant amount of blood.  A test to look inside the colon (sigmoidoscopy or colonoscopy).  How is this treated? This condition can usually be treated at home. However, various procedures may be done if dietary changes, lifestyle changes, and other home treatments do not help your symptoms. These procedures can help make the hemorrhoids smaller or remove them completely. Some of these procedures involve surgery, and others do not. Common procedures include:  Rubber band ligation. Rubber bands are placed at the base of the hemorrhoids to cut off the blood supply to them.  Sclerotherapy. Medicine is injected into the hemorrhoids to shrink them.  Infrared coagulation. A type of light energy is used to get rid of the hemorrhoids.  Hemorrhoidectomy surgery. The hemorrhoids are surgically removed, and the veins that supply them are tied off.  Stapled hemorrhoidopexy surgery. A circular stapling device is used to remove the hemorrhoids and use staples to cut off the blood supply to them.  Follow these instructions at home: Eating and drinking  Eat foods that have a lot of fiber in them, such as whole grains, beans, nuts, fruits, and vegetables. Ask your health care provider about taking products that have added fiber (fiber supplements).  Drink enough fluid to keep your urine clear or pale yellow. Managing pain and swelling  Take warm sitz baths for 20 minutes, 3-4 times a day to ease pain and discomfort.  If directed, apply ice to the affected area. Using ice packs between sitz baths may be helpful. ? Put ice in a plastic bag. ? Place a towel between your skin and the bag. ? Leave the ice on for 20 minutes, 2-3 times a day. General instructions  Take over-the-counter and prescription medicines only as told by your health care provider.  Use medicated creams or suppositories as told.  Exercise  regularly.  Go to the bathroom when you have the urge to have a bowel movement. Do not wait.  Avoid straining to have bowel movements.  Keep the anal area dry and clean. Use wet toilet paper or moist towelettes after a bowel movement.  Do not sit on the toilet for long periods of time. This increases blood pooling and pain. Contact a health care provider if:  You have increasing pain and swelling that are not controlled by treatment or medicine.  You have uncontrolled bleeding.  You have difficulty having a bowel movement, or you are unable to have a bowel movement.  You have pain or inflammation outside the area of the hemorrhoids. This information is not intended to replace advice given to you by your health care provider. Make sure you discuss any questions you have with your health care provider. Document Released: 07/02/2000 Document Revised: 12/03/2015 Document Reviewed: 03/19/2015 Elsevier Interactive Patient Education  2017 [employer].   How to Take a [employer] A sitz bath is a warm water bath that is taken while you are sitting down. The water should only come up to your hips and should cover your buttocks. Your health care provider may recommend a sitz bath to help you:  Clean the lower part of your body, including your genital area.  With itching.  With pain.  With sore muscles or muscles that tighten or spasm.  How to take a sitz bath Take 3-4 sitz baths per day or as told by your health care provider. 1. Partially fill a bathtub with warm water. You will only need the water to be deep enough to cover your hips and buttocks when you are sitting in it. 2. If your health care provider told you to put medicine in the water, follow the directions exactly. 3. Sit in the water and open the tub drain a little. 4. Turn on the warm water again to keep the tub at the correct level. Keep the water running constantly. 5. Soak in the water for 15-20 minutes or as told by  your health care provider. 6. After the sitz bath, pat the affected area dry first. Do not rub it. 7. Be careful when you stand up after the sitz bath because you may feel dizzy.  Contact a health care provider if:  Your symptoms get worse. Do not continue with sitz baths if your symptoms get worse.  You have new symptoms. Do not continue with sitz baths until you talk with your health care provider. This information is not intended to replace advice given to you by your health care provider. Make sure you discuss any questions you have with your health care provider. Document Released: 03/27/2004 Document Revised: 12/03/2015 Document Reviewed: 07/03/2014 Elsevier Interactive Patient Education  [employer].

## 2017-04-11 NOTE — Telephone Encounter (Signed)
Patient is calling patient was seen by his primary care doctor Dr. Merrie Roof at [facility] family today, and was told to call our office that the patient has a prolapsed hemorrhoid, he needed to be seen by his surgeon. Patient was here back in July for surgery he had a laparoscopic bilateral inguinal hernia repair,hernia repair umbilical with Dr. Dahlia Byes. Please call patient and advice.

## 2017-04-11 NOTE — Telephone Encounter (Signed)
Patient came in today

## 2017-04-11 NOTE — Progress Notes (Signed)
BP 113/75 (BP Location: Right Arm, Patient Position: Sitting, Cuff Size: Normal)   Pulse 85   Temp 98.3 F (36.8 C)   Ht 5\' 9"  (1.753 m)   Wt 190 lb 6.4 oz (86.4 kg)   SpO2 98%   BMI 28.12 kg/m    Subjective:    Patient ID: Keith Barber, male    DOB: Jun 05, 1971, 46 y.o.   MRN: [id number]  HPI: Kemal Amores is a 46 y.o. male  Chief Complaint  Patient presents with  . Diarrhea    Off and on for 3 weeks. Became severe Friday morning, and noticed rectal bleeding.  . Rectal Pain    Severe on Friday-Saturday  . Rectal Bleeding    Light, spotting   Patient presents with over 3 weeks of significant diarrhea and now some rectal bleeding and pain x about 4 days. States the bleeding is more a spotting, nothing profuse, and bright red blood. Using Lidocaine suppositories, tylenol, and imodium with some relief. Notes he's made some major lifestyle changes the past few months with quitting smoking using zyban and completely changing his diet. Has stopped amlodipine and zyban, wondering if this diarrhea was coming from medications, but no relief. Taking tylenol prn. Does note hemorrhoids the past few days.   Relevant past medical, surgical, family and social history reviewed and updated as indicated. Interim medical history since our last visit reviewed. Allergies and medications reviewed and updated.  Review of Systems  Constitutional: Negative for fever.  Respiratory: Negative.   Cardiovascular: Negative.   Gastrointestinal: Positive for anal bleeding, diarrhea and rectal pain.  Genitourinary: Negative.   Musculoskeletal: Negative.   Skin: Negative.   Neurological: Negative.   Psychiatric/Behavioral: Negative.    Per HPI unless specifically indicated above     Objective:    BP 113/75 (BP Location: Right Arm, Patient Position: Sitting, Cuff Size: Normal)   Pulse 85   Temp 98.3 F (36.8 C)   Ht 5\' 9"  (1.753 m)   Wt 190 lb 6.4 oz (86.4 kg)   SpO2 98%   BMI 28.12 kg/m   Wt  Readings from Last 3 Encounters:  04/11/17 191 lb 3.2 oz (86.7 kg)  04/11/17 190 lb 6.4 oz (86.4 kg)  03/22/17 185 lb (83.9 kg)    Physical Exam  Constitutional: He is oriented to person, place, and time. He appears well-developed and well-nourished. No distress.  HENT:  Head: Atraumatic.  Eyes: Pupils are equal, round, and reactive to light. Conjunctivae are normal.  Neck: Normal range of motion. Neck supple.  Cardiovascular: Normal rate and normal heart sounds.   Pulmonary/Chest: Effort normal and breath sounds normal. No respiratory distress.  Abdominal: Soft. Bowel sounds are normal. He exhibits no distension. There is tenderness (mild b/l lower abdominal ttp). There is no guarding.  Genitourinary: Rectal exam shows external hemorrhoid.     Musculoskeletal: Normal range of motion.  Neurological: He is alert and oriented to person, place, and time.  Skin: Skin is warm and dry.  Psychiatric: He has a normal mood and affect. His behavior is normal.  Nursing note and vitals reviewed.     Assessment & Plan:   Problem List Items Addressed This Visit    None    Visit Diagnoses    Hemorrhoids, unspecified hemorrhoid type    -  Primary   In multiple stages of severity with one very large and prolapsed. Continue imodium, start bentyl and anusol rectal cream prn. Sitz baths, push fluids   Relevant  Orders   CBC with Differential/Platelet (Completed)    If not improving over next few days, recommended f/u with his general surgeon to see if prolapsed hemorrhoid needs to be removed. Await CBC. Return precautions reviewed   Follow up plan: Return if symptoms worsen or fail to improve.

## 2017-04-11 NOTE — Telephone Encounter (Signed)
Routing to provider to advise.  

## 2017-04-11 NOTE — Progress Notes (Signed)
04/11/2017  Reason for Visit:  Hemorrhoid pain  History of Present Illness: Keith Barber is a 47 y.o. male who presents with a flare-up of his hemorrhoid. He believes it is thrombosed.  He reports that on 9/22 in the early morning he started having pain in the anal area and was having bouts of diarrhea.  He almost went to the ED that afternoon due to the pain, but he was able to get through it.  He took a few pills of imodium to help.  On 9/23, the pain became better, but then again at night time, he had another bowel movement with diarrhea and had more pain again, though not as severe.  He called out of work today and went to see his PCP.  He was referred to come here for further evaluation.  His PCP gave him a prescription for Bentyl and Anusol ointment.  He reports that he believes from time to time he has felt something bulging when he wipes after a bowel movement, but he is uncertain if this is always bulging or goes in and out.  He denies any significant blood in stool or in toilet paper.  Denies any abdominal pain, nausea, vomiting, fevers, or chills.  He is uncertain as to why he has the diarrhea, but reports his stools have been looser since he had hernia surgery in July with Dr. Dahlia Byes.  Past Medical History: Past Medical History:  Diagnosis Date  . Bone spur    LEFT SHOULDER THAT MAKES HIS LEFT ARM TINGLE  . GERD (gastroesophageal reflux disease)   . Hyperlipidemia   . Hypertension   . Migraines      Past Surgical History: Past Surgical History:  Procedure Laterality Date  . INGUINAL HERNIA REPAIR Bilateral 01/21/2017   Procedure: LAPAROSCOPIC BILATERAL INGUINAL HERNIA REPAIR;  Surgeon: Jules Husbands, MD;  Location: [facility] ORS;  Service: General;  Laterality: Bilateral;  . KNEE SURGERY Left   . SHOULDER SURGERY Right   . UMBILICAL HERNIA REPAIR N/A 01/21/2017   Procedure: HERNIA REPAIR UMBILICAL ADULT;  Surgeon: Jules Husbands, MD;  Location: [facility] ORS;  Service: General;   Laterality: N/A;    Home Medications: Prior to Admission medications   Medication Sig Start Date End Date Taking? Authorizing Provider  dicyclomine (BENTYL) 10 MG capsule Take 1 capsule (10 mg total) by mouth 4 (four) times daily -  before meals and at bedtime. 04/11/17  Yes Volney American, PA-C  hydrocortisone (ANUSOL-HC) 2.5 % rectal cream Place 1 application rectally 2 (two) times daily. 04/11/17  Yes Volney American, PA-C  ibuprofen (ADVIL,MOTRIN) 200 MG tablet Take 600 mg by mouth every 8 (eight) hours as needed (for pain.).   Yes [provider]  lisinopril (PRINIVIL,ZESTRIL) 10 MG tablet Take 1 tablet (10 mg total) by mouth daily. Patient taking differently: Take 10 mg by mouth every morning.  11/24/16  Yes Volney American, PA-C  loperamide (IMODIUM) 2 MG capsule Take 2-4 mg by mouth 4 (four) times daily as needed for diarrhea or loose stools.   Yes [provider]  rizatriptan (MAXALT) 10 MG tablet  11/27/16  Yes [provider]  rosuvastatin (CRESTOR) 10 MG tablet Take 1 tablet (10 mg total) by mouth daily. Patient taking differently: Take 10 mg by mouth every morning.  11/24/16  Yes Volney American, PA-C  HYDROcodone-acetaminophen (NORCO) 5-325 MG tablet Take 1 tablet by mouth every 6 (six) hours as needed for moderate pain. 04/11/17   Reem Fleury,  Argie Lober, MD    Allergies: No Known Allergies  Social History:  reports that he quit smoking about 2 months ago. His smoking use included Cigarettes. He has a 13.00 pack-year smoking history. He has never used smokeless tobacco. He reports that he does not drink alcohol or use drugs.   Family History: Family History  Problem Relation Age of Onset  . Diabetes Mother   . Migraines Mother   . Cancer Neg Hx   . COPD Neg Hx   . Heart disease Neg Hx   . Stroke Neg Hx     Review of Systems: Review of Systems  Constitutional: Negative for chills and fever.  HENT: Negative for hearing loss.    Respiratory: Negative for shortness of breath.   Cardiovascular: Negative for chest pain.  Gastrointestinal: Positive for diarrhea. Negative for abdominal pain, blood in stool, constipation, nausea and vomiting.  Genitourinary: Negative for dysuria.  Musculoskeletal: Negative for myalgias.  Skin: Negative for rash.  Neurological: Negative for dizziness.  Psychiatric/Behavioral: Negative for depression.  All other systems reviewed and are negative.   Physical Exam BP 121/81   Pulse 91   Temp 98.2 F (36.8 C) (Oral)   Ht 5\' 9"  (1.753 m)   Wt 86.7 kg (191 lb 3.2 oz)   BMI 28.24 kg/m  CONSTITUTIONAL: No acute distress HEENT:  Normocephalic, atraumatic, extraocular motion intact. NECK: Trachea is midline, and there is no jugular venous distension.  RESPIRATORY:  Lungs are clear, and breath sounds are equal bilaterally. Normal respiratory effort without pathologic use of accessory muscles. CARDIOVASCULAR: Heart is regular without murmurs, gallops, or rubs. GI: The abdomen is soft, nondistended, nontender.  RECTAL:  On external exam, there is an enlarged external hemorrhoid, inflamed, at the left lateral column.  No evidence of thrombosis.  There is also a mildly enlarged right anterior column external component, but only the left lateral is tender and inflamed.  On digital exam, there is no mass or lesion palpable. MUSCULOSKELETAL:  Normal muscle strength and tone in all four extremities.  No peripheral edema or cyanosis. SKIN: Skin turgor is normal. There are no pathologic skin lesions.  NEUROLOGIC:  Motor and sensation is grossly normal.  Cranial nerves are grossly intact. PSYCH:  Alert and oriented to person, place and time. Affect is normal.  Laboratory Analysis: No results found for this or any previous visit (from the past 24 hour(s)).  Imaging: No results found.  Assessment and Plan: This is a 46 y.o. male who presents with inflamed left lateral external  hemorrhoid.  Discussed with the patient that he does not require any procedure at this time.  He can use the Anusol that was prescribed in order to help with the inflammation, and we will also give him Vicodin to help with pain control.  Recommended that he start taking Metamucil for fiber supplementation in order to help with his diarrhea and bowel regimen.  Also recommended that he start doing Sitz baths twice daily to help soothe the tissue.    We will see him next month in order to see how his symptoms are doing and then we may discuss possible surgical intervention once all the inflammation is down.  Patient understands this plan and all of his questions have been answered.  Face-to-face time spent with the patient and care providers was 45 minutes, with more than 50% of the time spent counseling, educating, and coordinating care of the patient.     [name], [place]

## 2017-04-11 NOTE — Telephone Encounter (Signed)
Looks like he already saw surgery for evaluation.

## 2017-04-11 NOTE — Patient Instructions (Signed)
Prolapsed hemorrhoid

## 2017-04-11 NOTE — Telephone Encounter (Signed)
Patient called to see what type of hemmoroid patient has is it thrombosed?  He is scheduled for 2:45 this afternoon with surgeon  [phone]  Thanks

## 2017-04-12 ENCOUNTER — Telehealth: Payer: Self-pay | Admitting: Family Medicine

## 2017-04-12 LAB — CBC WITH DIFFERENTIAL/PLATELET
Basophils Absolute: 0 10*3/uL (ref 0.0–0.2)
Basos: 0 %
EOS (ABSOLUTE): 0.1 10*3/uL (ref 0.0–0.4)
EOS: 1 %
HEMATOCRIT: 50.2 % (ref 37.5–51.0)
HEMOGLOBIN: 16.8 g/dL (ref 13.0–17.7)
IMMATURE GRANS (ABS): 0 10*3/uL (ref 0.0–0.1)
IMMATURE GRANULOCYTES: 0 %
LYMPHS: 30 %
Lymphocytes Absolute: 3.4 10*3/uL — ABNORMAL HIGH (ref 0.7–3.1)
MCH: 30.4 pg (ref 26.6–33.0)
MCHC: 33.5 g/dL (ref 31.5–35.7)
MCV: 91 fL (ref 79–97)
MONOCYTES: 10 %
Monocytes Absolute: 1.2 10*3/uL — ABNORMAL HIGH (ref 0.1–0.9)
Neutrophils Absolute: 6.8 10*3/uL (ref 1.4–7.0)
Neutrophils: 59 %
Platelets: 222 10*3/uL (ref 150–379)
RBC: 5.52 x10E6/uL (ref 4.14–5.80)
RDW: 13.4 % (ref 12.3–15.4)
WBC: 11.5 10*3/uL — AB (ref 3.4–10.8)

## 2017-04-12 MED ORDER — CIPROFLOXACIN HCL 500 MG PO TABS: 500.0000 mg | ORAL_TABLET | Freq: Two times a day (BID) | ORAL | 0 refills | Status: DC

## 2017-04-12 MED ORDER — METRONIDAZOLE 500 MG PO TABS: 500.0000 mg | ORAL_TABLET | Freq: Two times a day (BID) | ORAL | 0 refills | Status: DC

## 2017-04-12 NOTE — Telephone Encounter (Signed)
Left message to call.

## 2017-04-12 NOTE — Telephone Encounter (Signed)
Please call and let him know his CBC did show a slightly elevated WBC count and given his persistent diarrhea, I think we should go ahead and start some antibiotics and monitor closely for improvement. I'll get some sent over to his pharmacy. He should also start a probiotic supplement with those.

## 2017-04-12 NOTE — Telephone Encounter (Signed)
Patent notified !

## 2017-04-18 ENCOUNTER — Encounter (INDEPENDENT_AMBULATORY_CARE_PROVIDER_SITE_OTHER): Payer: Self-pay | Admitting: Orthopaedic Surgery

## 2017-04-18 ENCOUNTER — Ambulatory Visit (INDEPENDENT_AMBULATORY_CARE_PROVIDER_SITE_OTHER): Payer: BLUE CROSS/BLUE SHIELD | Admitting: Orthopaedic Surgery

## 2017-04-18 VITALS — BP 104/71 | HR 84 | Resp 12 | Ht 66.0 in | Wt 185.0 lb

## 2017-04-18 DIAGNOSIS — G8929 Other chronic pain: Secondary | ICD-10-CM

## 2017-04-18 DIAGNOSIS — M25512 Pain in left shoulder: Secondary | ICD-10-CM | POA: Diagnosis not present

## 2017-04-18 MED ORDER — METHYLPREDNISOLONE ACETATE 40 MG/ML IJ SUSP
80.0000 mg | INTRAMUSCULAR | Status: AC | PRN
  Administered 2017-04-18: 80 mg

## 2017-04-18 MED ORDER — BUPIVACAINE HCL 0.5 % IJ SOLN
2.0000 mL | INTRAMUSCULAR | Status: AC | PRN
  Administered 2017-04-18: 2 mL via INTRA_ARTICULAR

## 2017-04-18 MED ORDER — LIDOCAINE HCL 1 % IJ SOLN
2.0000 mL | INTRAMUSCULAR | Status: AC | PRN
  Administered 2017-04-18: 2 mL

## 2017-04-18 NOTE — Progress Notes (Signed)
Office Visit Note   Patient: Keith Barber           Date of Birth: 1970-09-05           MRN: [id number] Visit Date: 04/18/2017              Requested by: Volney American, PA-C [place], [place] [address] PCP: Volney American, PA-C   Assessment & Plan: Visit Diagnoses:  1. Chronic left shoulder pain     Plan: Recent MRI scan demonstrates moderate glenohumeral joint osteoarthritis. Some degenerative change at the acromioclavicular joint. No rotator cuff tear. I believe the arthritis of  the glenohumeral joint is responsible for the majority of his pain. Will inject the joint with cortisone. Discussed  long-term expectations Follow-Up Instructions: Return if symptoms worsen or fail to improve.   Orders:  No orders of the defined types were placed in this encounter.  No orders of the defined types were placed in this encounter.     Procedures: Large Joint Inj Date/Time: 04/18/2017 4:06 PM Performed by: [name] Authorized by: [name]   Consent Given by:  Patient Timeout: prior to procedure the correct patient, procedure, and site was verified   Indications:  Pain Location:  Shoulder Site:  L glenohumeral Prep: patient was prepped and draped in usual sterile fashion   Needle Size:  25 G Needle Length:  1.5 inches Approach:  Posterior Ultrasound Guidance: No   Fluoroscopic Guidance: No   Arthrogram: No   Medications:  2 mL lidocaine 1 %; 2 mL bupivacaine 0.5 %; 80 mg methylPREDNISolone acetate 40 MG/ML Aspiration Attempted: No   Patient tolerance:  Patient tolerated the procedure well with no immediate complications     Clinical Data: No additional findings.   Subjective: Chief Complaint  Patient presents with  . Left Shoulder - Pain, Edema, Numbness    Keith Barber is a 46 y o here today for MRI results of L shoulder.  Multiyear history of off and on pain left shoulder. Has had some relief with NSAIDs. Has some  difficulty with overhead activity and sleeping on that shoulder. Prior history of right shoulder surgery. Recent MRI scan demonstrates moderate degenerative glenohumeral chondral thinning without effusion. Mild to moderate spurring and mild subcortical marrow edema at the acromioclavicular joint. Mild to moderate supraspinatus and infraspinatus tendinopathy.  HPI  Review of Systems  Constitutional: Positive for fatigue.  HENT: Negative for hearing loss.   Respiratory: Negative for apnea, chest tightness and shortness of breath.   Cardiovascular: Negative for chest pain, palpitations and leg swelling.  Gastrointestinal: Negative for blood in stool and constipation.  Genitourinary: Negative for difficulty urinating.  Musculoskeletal: Negative for arthralgias, back pain, joint swelling, myalgias, neck pain and neck stiffness.  Neurological: Negative for weakness, numbness and headaches.  Hematological: Does not bruise/bleed easily.  Psychiatric/Behavioral: Positive for sleep disturbance. The patient is not nervous/anxious.      Objective: Vital Signs: BP 104/71   Pulse 84   Resp 12   Ht 5\' 6"  (1.676 m)   Wt 185 lb (83.9 kg)   BMI 29.86 kg/m   Physical Exam  Ortho Exam minimally positive impingement. Some loss of external rotation which is relatively mild. Able to place his arm fully over his head and flexion. Abduction and 90 and then above that is uncomfortable. Some pain along the glenohumeral joint both anteriorly and posteriorly without crepitation. No pain at the acromioclavicular joint. Skin intact. Good grip good  release. Good strength with no pain with range of motion of the cervical spine  Specialty Comments:  No specialty comments available.  Imaging: No results found.   PMFS History: Patient Active Problem List   Diagnosis Date Noted  . Umbilical hernia without obstruction and without gangrene   . Bilateral inguinal hernia without obstruction or gangrene   .  Tobacco use 11/26/2016  . Hypertension   . Hyperlipidemia   . Migraines   . Rotator cuff tendinitis, left 11/12/2015  . Tear of left glenoid labrum 11/12/2015   Past Medical History:  Diagnosis Date  . Bone spur    LEFT SHOULDER THAT MAKES HIS LEFT ARM TINGLE  . GERD (gastroesophageal reflux disease)   . Hyperlipidemia   . Hypertension   . Migraines     Family History  Problem Relation Age of Onset  . Diabetes Mother   . Migraines Mother   . Cancer Neg Hx   . COPD Neg Hx   . Heart disease Neg Hx   . Stroke Neg Hx     Past Surgical History:  Procedure Laterality Date  . INGUINAL HERNIA REPAIR Bilateral 01/21/2017   Procedure: LAPAROSCOPIC BILATERAL INGUINAL HERNIA REPAIR;  Surgeon: Jules Husbands, MD;  Location: [facility] ORS;  Service: General;  Laterality: Bilateral;  . KNEE SURGERY Left   . SHOULDER SURGERY Right   . UMBILICAL HERNIA REPAIR N/A 01/21/2017   Procedure: HERNIA REPAIR UMBILICAL ADULT;  Surgeon: Jules Husbands, MD;  Location: [facility] OR[facility];  Service: General;  Laterality: N/A;   Social History   Occupational History  . Not on file.   Social History Main Topics  . Smoking status: Former Smoker    Packs/day: 0.50    Years: 26.00    Types: Cigarettes    Quit date: 01/13/2017  . Smokeless tobacco: Never Used     Comment: PT STARTED TAKING ZYBAN ON 01-08-17 IN HOPES OF QUITTING  . Alcohol use No  . Drug use: No  . Sexual activity: Not on file

## 2017-05-04 ENCOUNTER — Ambulatory Visit (INDEPENDENT_AMBULATORY_CARE_PROVIDER_SITE_OTHER): Payer: BLUE CROSS/BLUE SHIELD | Admitting: Orthopedic Surgery

## 2017-05-04 ENCOUNTER — Encounter (INDEPENDENT_AMBULATORY_CARE_PROVIDER_SITE_OTHER): Payer: Self-pay | Admitting: Orthopedic Surgery

## 2017-05-04 VITALS — BP 130/85 | HR 96 | Resp 14 | Ht 68.0 in | Wt 192.0 lb

## 2017-05-04 DIAGNOSIS — M25512 Pain in left shoulder: Secondary | ICD-10-CM

## 2017-05-04 DIAGNOSIS — M75112 Incomplete rotator cuff tear or rupture of left shoulder, not specified as traumatic: Secondary | ICD-10-CM

## 2017-05-04 DIAGNOSIS — M7542 Impingement syndrome of left shoulder: Secondary | ICD-10-CM | POA: Diagnosis not present

## 2017-05-04 DIAGNOSIS — M19012 Primary osteoarthritis, left shoulder: Secondary | ICD-10-CM | POA: Diagnosis not present

## 2017-05-04 DIAGNOSIS — M19019 Primary osteoarthritis, unspecified shoulder: Secondary | ICD-10-CM | POA: Diagnosis not present

## 2017-05-04 DIAGNOSIS — G8929 Other chronic pain: Secondary | ICD-10-CM

## 2017-05-04 MED ORDER — LIDOCAINE HCL 1 % IJ SOLN
0.5000 mL | INTRAMUSCULAR | Status: AC | PRN
  Administered 2017-05-04: .5 mL

## 2017-05-04 MED ORDER — BUPIVACAINE HCL 0.5 % IJ SOLN
1.0000 mL | INTRAMUSCULAR | Status: AC | PRN
  Administered 2017-05-04: 1 mL via INTRA_ARTICULAR

## 2017-05-04 MED ORDER — METHYLPREDNISOLONE ACETATE 40 MG/ML IJ SUSP
13.3300 mg | INTRAMUSCULAR | Status: AC | PRN
  Administered 2017-05-04: 13.33 mg via INTRA_ARTICULAR

## 2017-05-04 NOTE — Progress Notes (Deleted)
Office Visit Note   Patient: Keith Barber           Date of Birth: June 22, 1971           MRN: [id number] Visit Date: 05/04/2017              Requested by: Volney American, PA-C [place], [place] [address] PCP: Volney American, PA-C   Assessment & Plan: Visit Diagnoses:  1. Impingement syndrome of left shoulder   2. Arthritis of left acromioclavicular joint   3. Chronic left shoulder pain   4. Arthritis of shoulder     Plan: ***  Follow-Up Instructions: No Follow-up on file.   Orders:  No orders of the defined types were placed in this encounter.  No orders of the defined types were placed in this encounter.     Procedures: No notes on file   Clinical Data: No additional findings.   Subjective: Chief Complaint  Patient presents with  . Left Shoulder - Pain  . Shoulder Pain    Left shoulder pain x 10 years, limited range of motion, no surgery to left shoulder, no injury, not diabetic, difficulty sleeping    HPI  Review of Systems  Constitutional: Negative for fatigue.  HENT: Negative for congestion.   Eyes: Negative for pain.  Respiratory: Positive for cough.   Cardiovascular: Negative for leg swelling.  Gastrointestinal: Positive for blood in stool and diarrhea.  Endocrine: Negative for cold intolerance.  Genitourinary: Negative for difficulty urinating.  Musculoskeletal: Positive for back pain.  Skin: Negative for rash.  Allergic/Immunologic: Negative for food allergies.  Neurological: Positive for dizziness.  Hematological: Does not bruise/bleed easily.  Psychiatric/Behavioral: Negative for sleep disturbance.     Objective: Vital Signs: BP 130/85 (BP Location: Right Arm, Patient Position: Sitting, Cuff Size: Normal)   Pulse 96   Resp 14   Ht 5\' 8"  (1.727 m)   Wt 192 lb (87.1 kg)   BMI 29.19 kg/m   Physical Exam  Ortho Exam  Specialty Comments:  No specialty comments available.  Imaging: No results  found.   PMFS History:  Patient Active Problem List   Diagnosis Date Noted  . Umbilical hernia without obstruction and without gangrene   . Bilateral inguinal hernia without obstruction or gangrene   . Tobacco use 11/26/2016  . Hypertension   . Hyperlipidemia   . Migraines   . Rotator cuff tendinitis, left 11/12/2015  . Tear of left glenoid labrum 11/12/2015   Past Medical History:  Diagnosis Date  . Bone spur    LEFT SHOULDER THAT MAKES HIS LEFT ARM TINGLE  . GERD (gastroesophageal reflux disease)   . Hyperlipidemia   . Hypertension   . Migraines     Family History  Problem Relation Age of Onset  . Diabetes Mother   . Migraines Mother   . Cancer Neg Hx   . COPD Neg Hx   . Heart disease Neg Hx   . Stroke Neg Hx     Past Surgical History:  Procedure Laterality Date  . INGUINAL HERNIA REPAIR Bilateral 01/21/2017   Procedure: LAPAROSCOPIC BILATERAL INGUINAL HERNIA REPAIR;  Surgeon: Jules Husbands, MD;  Location: [facility] OR[facility];  Service: General;  Laterality: Bilateral;  . KNEE SURGERY Left   . SHOULDER SURGERY Right   . UMBILICAL HERNIA REPAIR N/A 01/21/2017   Procedure: HERNIA REPAIR UMBILICAL ADULT;  Surgeon: Jules Husbands, MD;  Location: [facility] OR[facility];  Service: General;  Laterality: N/A;  Social History   Occupational History  . Not on file.   Social History Main Topics  . Smoking status: Former Smoker    Packs/day: 0.50    Years: 26.00    Types: Cigarettes    Quit date: 01/13/2017  . Smokeless tobacco: Never Used     Comment: PT STARTED TAKING ZYBAN ON 01-08-17 IN HOPES OF QUITTING  . Alcohol use No  . Drug use: No  . Sexual activity: Not on file

## 2017-05-04 NOTE — Progress Notes (Signed)
Office Visit Note   Patient: Keith Barber           Date of Birth: 1971-02-13           MRN: [id number] Visit Date: 05/04/2017              Requested by: Volney American, PA-C [place], [place] [address] PCP: Volney American, PA-C   Assessment & Plan: Visit Diagnoses:  1. Impingement syndrome of left shoulder   2. Arthritis of left acromioclavicular joint   3. Chronic left shoulder pain   4. Arthritis of shoulder   5. Partial tear of left rotator cuff     Plan: #1: Corticosteroid injection to the left before meals joint with improvement of pain #2: Follow back up in 4 weeks for recheck evaluation and consideration for a arthroscopic subacromial decompression, distal clavicle excision, debridement glenohumeral joint, and possible rotator cuff repair if indicated  Follow-Up Instructions: Return in about 4 weeks (around 06/01/2017).   Orders:  Orders Placed This Encounter  Procedures  . Medium Joint Injection/Arthrocentesis   No orders of the defined types were placed in this encounter.     Procedures: Medium Joint Inj Date/Time: 05/04/2017 3:09 PM Performed by: Biagio Borg D Authorized by: Biagio Borg D   Consent Given by:  Patient Site marked: the procedure site was marked   Timeout: prior to procedure the correct patient, procedure, and site was verified   Indications:  Pain Location:  Shoulder Site:  L acromioclavicular Prep: patient was prepped and draped in usual sterile fashion   Needle Size:  25 G Needle Length:  1.5 inches Approach:  Superior Ultrasound Guided: No   Fluoroscopic Guidance: No   Medications:  1 mL bupivacaine 0.5 %; 0.5 mL lidocaine 1 %; 13.33 mg methylPREDNISolone acetate 40 MG/ML Aspiration Attempted: No       Clinical Data: No additional findings.   Subjective: Chief Complaint  Patient presents with  . Left Shoulder - Pain  . Shoulder Pain    Left shoulder pain x 10 years, limited range of  motion, no surgery to left shoulder, no injury, not diabetic, difficulty sleeping    HPI  Keith Barber is a 46 year old white male who is seen today for follow-up of his left shoulder pain. He has a history of somewhat between 8 and 10 years of left shoulder pain. He was first seen on 03/22/2017 at that time a an MRI was ordered. This revealed moderate degenerative glenohumeral chondral thinning without effusion. Mild to moderate spurring and mild subcortical marrow edema at the [facility] joint. There was also mild to moderate supraspinatus and infraspinatus tendinopathy. He underwent an intra-articular injection on October 1 and [place] states it is now wearing off. He returns today complaining more about pain over the superior aspect of the shoulder at [facility] area. Seen today for evaluation.  Review of Systems  Constitutional: Positive for fatigue.  HENT: Negative for hearing loss.   Respiratory: Negative for apnea, chest tightness and shortness of breath.   Cardiovascular: Negative for chest pain, palpitations and leg swelling.  Gastrointestinal: Negative for blood in stool and constipation.  Genitourinary: Negative for difficulty urinating.  Musculoskeletal: Negative for arthralgias, back pain, joint swelling, myalgias, neck pain and neck stiffness.  Neurological: Negative for weakness, numbness and headaches.  Hematological: Does not bruise/bleed easily.  Psychiatric/Behavioral: Positive for sleep disturbance. The patient is not nervous/anxious.      Objective: Vital Signs: BP 130/85 (BP Location: Right Arm, Patient  Position: Sitting, Cuff Size: Normal)   Pulse 96   Resp 14   Ht 5\' 8"  (1.727 m)   Wt 192 lb (87.1 kg)   BMI 29.19 kg/m   Physical Exam  Constitutional: He is oriented to person, place, and time. He appears well-developed and well-nourished.  HENT:  Head: Normocephalic and atraumatic.  Eyes: Pupils are equal, round, and reactive to light. EOM are normal.  Pulmonary/Chest: Effort  normal.  Neurological: He is alert and oriented to person, place, and time.  Skin: Skin is warm and dry.  Psychiatric: He has a normal mood and affect. His behavior is normal. Judgment and thought content normal.    Ortho Exam  Today he has 160 with forward flexion 150 of abduction. With the arm at 90 of abduction he has around 80 of external rotation 70 of internal rotation. He does have mild empty can testing at abduction and forward flexion. Exquisitely tender over the before meals joint. The pain with cross chest maneuver. He is a little bit weak with the resistance against external rotation. Abduction is also has some weakness.  Specialty Comments:  No specialty comments available.  Imaging: Mr Shoulder Left W/o Contrast  Result Date: 04/08/2017 CLINICAL DATA:  Left shoulder pain with tingling in the fingers intermittently, duration 8 years. EXAM: MRI OF THE LEFT SHOULDER WITHOUT CONTRAST TECHNIQUE: Multiplanar, multisequence MR imaging of the shoulder was performed. No intravenous contrast was administered. COMPARISON:  Report from radiographs dated 04/25/2015 from [place] [facility]. FINDINGS: Despite efforts by the technologist and patient, motion artifact is present on today's exam and could not be eliminated. This reduces exam sensitivity and specificity. Rotator cuff: Mild to moderate supraspinatus and infraspinatus tendinopathy. Muscles:  Subtle accentuated T2 signal in the teres minor muscle. Biceps long head:  Unremarkable Acromioclavicular Joint: Mild to moderate spurring and mild subcortical marrow edema. The spurring slightly indents the supraspinatus myotendinous junction. Physiologic fluid in the subacromial subdeltoid bursa. Type III acromion. Glenohumeral Joint: Moderate degenerative glenohumeral chondral thinning. No joint effusion. Labrum:  Grossly unremarkable Bones: No significant extra-articular osseous abnormalities identified. Other: No supplemental non-categorized  findings. IMPRESSION: 1. Mild to moderate supraspinatus and infraspinatus tendinopathy without tear. 2. Moderate degenerative glenohumeral chondral thinning and mild to moderate spurring of the [facility] joint. Mildly unfavorable subacromial morphology. 3. Subtle accentuated T2 signal in the teres minor muscle suspicious for quadrilateral space syndrome. No discrete mass lesion along the quadrilateral space is observed. Electronically Signed   By: Van Clines M.D.   On: 04/08/2017 11:26     PMFS History: Patient Active Problem List   Diagnosis Date Noted  . Umbilical hernia without obstruction and without gangrene   . Bilateral inguinal hernia without obstruction or gangrene   . Tobacco use 11/26/2016  . Hypertension   . Hyperlipidemia   . Migraines   . Rotator cuff tendinitis, left 11/12/2015  . Tear of left glenoid labrum 11/12/2015   Past Medical History:  Diagnosis Date  . Bone spur    LEFT SHOULDER THAT MAKES HIS LEFT ARM TINGLE  . GERD (gastroesophageal reflux disease)   . Hyperlipidemia   . Hypertension   . Migraines     Family History  Problem Relation Age of Onset  . Diabetes Mother   . Migraines Mother   . Cancer Neg Hx   . COPD Neg Hx   . Heart disease Neg Hx   . Stroke Neg Hx     Past Surgical History:  Procedure Laterality Date  .  INGUINAL HERNIA REPAIR Bilateral 01/21/2017   Procedure: LAPAROSCOPIC BILATERAL INGUINAL HERNIA REPAIR;  Surgeon: Jules Husbands, MD;  Location: [facility] ORS;  Service: General;  Laterality: Bilateral;  . KNEE SURGERY Left   . SHOULDER SURGERY Right   . UMBILICAL HERNIA REPAIR N/A 01/21/2017   Procedure: HERNIA REPAIR UMBILICAL ADULT;  Surgeon: Jules Husbands, MD;  Location: [facility] ORS;  Service: General;  Laterality: N/A;   Social History   Occupational History  . Not on file.   Social History Main Topics  . Smoking status: Former Smoker    Packs/day: 0.50    Years: 26.00    Types: Cigarettes    Quit date: 01/13/2017  . Smokeless  tobacco: Never Used     Comment: PT STARTED TAKING ZYBAN ON 01-08-17 IN HOPES OF QUITTING  . Alcohol use No  . Drug use: No  . Sexual activity: Not on file

## 2017-05-05 ENCOUNTER — Ambulatory Visit (INDEPENDENT_AMBULATORY_CARE_PROVIDER_SITE_OTHER): Payer: BLUE CROSS/BLUE SHIELD | Admitting: Orthopaedic Surgery

## 2017-05-16 ENCOUNTER — Ambulatory Visit (INDEPENDENT_AMBULATORY_CARE_PROVIDER_SITE_OTHER): Payer: BLUE CROSS/BLUE SHIELD | Admitting: Surgery

## 2017-05-16 ENCOUNTER — Encounter: Payer: Self-pay | Admitting: Surgery

## 2017-05-16 VITALS — BP 123/96 | HR 109 | Temp 99.0°F | Ht 68.0 in | Wt 192.8 lb

## 2017-05-16 DIAGNOSIS — K644 Residual hemorrhoidal skin tags: Secondary | ICD-10-CM | POA: Diagnosis not present

## 2017-05-16 NOTE — Patient Instructions (Signed)
You will need to continue your Bowel Regimen until you are completely pain free. Continue Metamucil and water to help relieve constipation.  You may do Sitz baths 2 times daily as needed for discomfort.  If you develop any increased pain, bleeding, redness, drainage of pus, or any fever or chills; please call our office immediately so that we can work you in.  Please call our office with any questions or concerns.

## 2017-05-16 NOTE — Progress Notes (Signed)
05/16/2017  History of Present Illness: Keith Barber is a 46 y.o. male who presents as a follow up after an inflamed external hemorrhoid.  He reports that his pain continued for another week or so, but over the past month has been fairly uneventful.  He describes having better bowel movements and denies any significant diarrhea. He continues taking Metamucil and drinking more water.  He never did any sitz baths.  Denies any pain with bowel movements and denies any blood in stool.  Past Medical History: Past Medical History:  Diagnosis Date  . Bone spur    LEFT SHOULDER THAT MAKES HIS LEFT ARM TINGLE  . GERD (gastroesophageal reflux disease)   . Hyperlipidemia   . Hypertension   . Migraines      Past Surgical History: Past Surgical History:  Procedure Laterality Date  . INGUINAL HERNIA REPAIR Bilateral 01/21/2017   Procedure: LAPAROSCOPIC BILATERAL INGUINAL HERNIA REPAIR;  Surgeon: Jules Husbands, MD;  Location: [facility] ORS;  Service: General;  Laterality: Bilateral;  . KNEE SURGERY Left   . SHOULDER SURGERY Right   . UMBILICAL HERNIA REPAIR N/A 01/21/2017   Procedure: HERNIA REPAIR UMBILICAL ADULT;  Surgeon: Jules Husbands, MD;  Location: [facility] ORS;  Service: General;  Laterality: N/A;    Home Medications: Prior to Admission medications   Medication Sig Start Date End Date Taking? Authorizing Provider  dicyclomine (BENTYL) 10 MG capsule Take 1 capsule (10 mg total) by mouth 4 (four) times daily -  before meals and at bedtime. 04/11/17  Yes Volney American, PA-C  ibuprofen (ADVIL,MOTRIN) 200 MG tablet Take 600 mg by mouth every 8 (eight) hours as needed (for pain.).   Yes [provider]  lisinopril (PRINIVIL,ZESTRIL) 10 MG tablet Take 1 tablet (10 mg total) by mouth daily. 11/24/16  Yes Volney American, PA-C  rizatriptan (MAXALT) 10 MG tablet Take 10 mg by mouth as needed for migraine.  11/27/16  Yes [provider]  rosuvastatin (CRESTOR) 10 MG tablet Take 1  tablet (10 mg total) by mouth daily. 11/24/16  Yes Volney American, PA-C    Allergies: No Known Allergies  Social History:  reports that he quit smoking about 4 months ago. His smoking use included Cigarettes. He has a 13.00 pack-year smoking history. He has never used smokeless tobacco. He reports that he does not drink alcohol or use drugs.   Family History: Family History  Problem Relation Age of Onset  . Diabetes Mother   . Migraines Mother   . Cancer Neg Hx   . COPD Neg Hx   . Heart disease Neg Hx   . Stroke Neg Hx     Review of Systems: Review of Systems  Constitutional: Negative for chills and fever.  Gastrointestinal: Negative for abdominal pain, blood in stool, constipation and diarrhea.    Physical Exam BP (!) 123/96   Pulse (!) 109   Temp 99 F (37.2 C) (Oral)   Ht 5\' 8"  (1.727 m)   Wt 87.5 kg (192 lb 12.8 oz)   BMI 29.32 kg/m  CONSTITUTIONAL: No acute distress, well nourished. RESPIRATORY:  Lungs are clear, and breath sounds are equal bilaterally. Normal respiratory effort without pathologic use of accessory muscles. CARDIOVASCULAR: Heart is regular without murmurs, gallops, or rubs. GI: The abdomen is soft, nondistended, nontender.  RECTAL:  External exam reveals two external hemorrhoids without any inflammation and non-tender to palpation.  No erythema or areas of fluctuance.  Digital exam deferred. MUSCULOSKELETAL:  Normal muscle  strength and tone in all four extremities.  No peripheral edema or cyanosis. NEUROLOGIC:  Motor and sensation is grossly normal.  Cranial nerves are grossly intact. PSYCH:  Alert and oriented to person, place and time. Affect is normal.  Labs/Imaging: None  Assessment and Plan: This is a 46 y.o. male who presents as a follow up for inflamed external hemorrhoid, now resolved.  Discussed with patient that could potentially set him up for external hemorrhoidectomy, though this could be painful due to the nerve supply to the  area.  However, at this point, there is no urgency to doing this, and we could also do watchful waiting, while keeping the metamucil and doing sitz baths for comfort.  He is in agreement with conservative measures at this point and will keep [place] informed of any changes of plan or if there are any flare-ups.  Patient will follow up on an as needed basis.     Keith Barber, [name]

## 2017-05-30 ENCOUNTER — Ambulatory Visit (INDEPENDENT_AMBULATORY_CARE_PROVIDER_SITE_OTHER): Payer: BLUE CROSS/BLUE SHIELD | Admitting: Orthopaedic Surgery

## 2017-05-30 ENCOUNTER — Encounter (INDEPENDENT_AMBULATORY_CARE_PROVIDER_SITE_OTHER): Payer: Self-pay | Admitting: Orthopaedic Surgery

## 2017-05-30 VITALS — BP 108/74 | HR 99 | Resp 12 | Ht 69.0 in | Wt 185.0 lb

## 2017-05-30 DIAGNOSIS — G8929 Other chronic pain: Secondary | ICD-10-CM | POA: Diagnosis not present

## 2017-05-30 DIAGNOSIS — M25512 Pain in left shoulder: Secondary | ICD-10-CM | POA: Diagnosis not present

## 2017-05-30 NOTE — Progress Notes (Signed)
Office Visit Note   Patient: Keith Barber           Date of Birth: 23-Nov-1970           MRN: [id number] Visit Date: 05/30/2017              Requested by: Volney American, PA-C [place], [place] [address] PCP: Volney American, PA-C   Assessment & Plan: Visit Diagnoses:  1. Chronic left shoulder pain     Plan: After much discussion And would like to proceed with a left shoulder arthroscopy. Prior MRI scan demonstrated impingement related to the acromium and the acromioclavicular joint. He also has chondromalacia of the glenohumeral joint. He has not had much relief with an injection into the acromioclavicular joint on one occasion in the glenohumeral joint on another. Continues to have a painful overhead arc of motion. We have discussed the surgery in detail including the outpatient nature incisions and possible need for a mini open rotator cuff tear. He is also aware of potential time out of work. We'll schedule the surgery sometime in December  Follow-Up Instructions: Return will schedule surgery.   Orders:  No orders of the defined types were placed in this encounter.  No orders of the defined types were placed in this encounter.     Procedures: No procedures performed   Clinical Data: No additional findings.   Subjective: Chief Complaint  Patient presents with  . Left Shoulder - Pain    Keith Barber is a 46 y o that is here to diascuss Left shoulder surgery  See discussion above. Keith Barber continues to have significant pain in his left shoulder to the point of compromise. He's had a cortisone injection into the acromioclavicular joint and glenohumeral joint without any sustained relief. He denies numbness or tingling. Pain is problem with left shoulder at eye level and above.  Review of Systems  Constitutional: Negative for fatigue.  HENT: Negative for hearing loss.   Respiratory: Negative for apnea, chest tightness and shortness of breath.     Cardiovascular: Negative for chest pain, palpitations and leg swelling.  Gastrointestinal: Negative for blood in stool, constipation and diarrhea.  Genitourinary: Negative for difficulty urinating.  Musculoskeletal: Negative for arthralgias, back pain, joint swelling, myalgias, neck pain and neck stiffness.  Neurological: Positive for headaches. Negative for weakness and numbness.  Hematological: Does not bruise/bleed easily.  Psychiatric/Behavioral: Negative for sleep disturbance. The patient is not nervous/anxious.      Objective: Vital Signs: BP 108/74   Pulse 99   Resp 12   Ht 5\' 9"  (1.753 m)   Wt 185 lb (83.9 kg)   BMI 27.32 kg/m   Physical Exam  Ortho Exam left shoulder with positive impingement testing. Also was positive empty can testing. Able to raise left arm fully overhead with a painful arc. Some pain in the anterior lateral subacromial region. Good strength. No popping or clicking. Good grip and good release. No pain range of motion of cervical spine  Specialty Comments:  No specialty comments available.  Imaging: No results found.   PMFS History: Patient Active Problem List   Diagnosis Date Noted  . Umbilical hernia without obstruction and without gangrene   . Bilateral inguinal hernia without obstruction or gangrene   . Tobacco use 11/26/2016  . Hypertension   . Hyperlipidemia   . Migraines   . Rotator cuff tendinitis, left 11/12/2015  . Tear of left glenoid labrum 11/12/2015   Past Medical History:  Diagnosis Date  . Bone spur    LEFT SHOULDER THAT MAKES HIS LEFT ARM TINGLE  . GERD (gastroesophageal reflux disease)   . Hyperlipidemia   . Hypertension   . Migraines     Family History  Problem Relation Age of Onset  . Diabetes Mother   . Migraines Mother   . Cancer Neg Hx   . COPD Neg Hx   . Heart disease Neg Hx   . Stroke Neg Hx     Past Surgical History:  Procedure Laterality Date  . KNEE SURGERY Left   . SHOULDER SURGERY Right     Social History   Occupational History  . Not on file  Tobacco Use  . Smoking status: Former Smoker    Packs/day: 0.50    Years: 26.00    Pack years: 13.00    Types: Cigarettes    Last attempt to quit: 01/13/2017    Years since quitting: 0.3  . Smokeless tobacco: Never Used  . Tobacco comment: PT STARTED TAKING ZYBAN ON 01-08-17 IN HOPES OF QUITTING  Substance and Sexual Activity  . Alcohol use: No  . Drug use: No  . Sexual activity: Not on file

## 2017-06-03 ENCOUNTER — Ambulatory Visit (INDEPENDENT_AMBULATORY_CARE_PROVIDER_SITE_OTHER): Payer: BLUE CROSS/BLUE SHIELD | Admitting: Orthopaedic Surgery

## 2017-06-22 NOTE — Progress Notes (Signed)
Spoke with Keith Barber at Dr. Rudene Anda office about surgery has been scheduled as a right shoulder, but in Dr. Rudene Anda note it says left shoulder. She states she will check into that, but pt is moving surgery to another date.

## 2017-07-01 ENCOUNTER — Other Ambulatory Visit: Payer: Self-pay

## 2017-07-01 ENCOUNTER — Encounter (HOSPITAL_COMMUNITY): Payer: Self-pay | Admitting: Urology

## 2017-07-04 ENCOUNTER — Inpatient Hospital Stay (INDEPENDENT_AMBULATORY_CARE_PROVIDER_SITE_OTHER): Payer: BLUE CROSS/BLUE SHIELD | Admitting: Orthopaedic Surgery

## 2017-07-05 ENCOUNTER — Ambulatory Visit (HOSPITAL_COMMUNITY): Payer: BLUE CROSS/BLUE SHIELD | Admitting: Anesthesiology

## 2017-07-05 ENCOUNTER — Ambulatory Visit (HOSPITAL_COMMUNITY)
Admission: RE | Admit: 2017-07-05 | Discharge: 2017-07-05 | Disposition: A | Discharge status: Home / Self Care | Payer: BLUE CROSS/BLUE SHIELD | Source: Ambulatory Visit | Attending: Orthopaedic Surgery | Admitting: Orthopaedic Surgery

## 2017-07-05 ENCOUNTER — Other Ambulatory Visit: Payer: Self-pay

## 2017-07-05 ENCOUNTER — Encounter (HOSPITAL_COMMUNITY): Payer: Self-pay

## 2017-07-05 ENCOUNTER — Encounter (HOSPITAL_COMMUNITY)
Admission: RE | Disposition: A | Discharge status: Home / Self Care | Payer: Self-pay | Source: Ambulatory Visit | Attending: Orthopaedic Surgery

## 2017-07-05 ENCOUNTER — Telehealth: Payer: Self-pay

## 2017-07-05 DIAGNOSIS — M7542 Impingement syndrome of left shoulder: Secondary | ICD-10-CM

## 2017-07-05 DIAGNOSIS — M19012 Primary osteoarthritis, left shoulder: Secondary | ICD-10-CM

## 2017-07-05 DIAGNOSIS — E785 Hyperlipidemia, unspecified: Secondary | ICD-10-CM | POA: Diagnosis not present

## 2017-07-05 DIAGNOSIS — M75112 Incomplete rotator cuff tear or rupture of left shoulder, not specified as traumatic: Secondary | ICD-10-CM | POA: Diagnosis not present

## 2017-07-05 DIAGNOSIS — S43432A Superior glenoid labrum lesion of left shoulder, initial encounter: Secondary | ICD-10-CM | POA: Diagnosis present

## 2017-07-05 DIAGNOSIS — M24112 Other articular cartilage disorders, left shoulder: Secondary | ICD-10-CM | POA: Diagnosis not present

## 2017-07-05 DIAGNOSIS — G43909 Migraine, unspecified, not intractable, without status migrainosus: Secondary | ICD-10-CM | POA: Insufficient documentation

## 2017-07-05 DIAGNOSIS — I10 Essential (primary) hypertension: Secondary | ICD-10-CM | POA: Diagnosis not present

## 2017-07-05 DIAGNOSIS — Z87891 Personal history of nicotine dependence: Secondary | ICD-10-CM | POA: Insufficient documentation

## 2017-07-05 DIAGNOSIS — Z79899 Other long term (current) drug therapy: Secondary | ICD-10-CM | POA: Insufficient documentation

## 2017-07-05 DIAGNOSIS — M75122 Complete rotator cuff tear or rupture of left shoulder, not specified as traumatic: Secondary | ICD-10-CM | POA: Diagnosis not present

## 2017-07-05 HISTORY — DX: Primary osteoarthritis, left shoulder: M19.012

## 2017-07-05 HISTORY — DX: Impingement syndrome of left shoulder: M75.42

## 2017-07-05 HISTORY — PX: SHOULDER ARTHROSCOPY WITH DISTAL CLAVICLE RESECTION: SHX5675

## 2017-07-05 LAB — BASIC METABOLIC PANEL
Anion gap: 8 (ref 5–15)
BUN: 15 mg/dL (ref 6–20)
CO2: 20 mmol/L — AB (ref 22–32)
CREATININE: 0.89 mg/dL (ref 0.61–1.24)
Calcium: 8.7 mg/dL — ABNORMAL LOW (ref 8.9–10.3)
Chloride: 108 mmol/L (ref 101–111)
Glucose, Bld: 112 mg/dL — ABNORMAL HIGH (ref 65–99)
Potassium: 3.9 mmol/L (ref 3.5–5.1)
SODIUM: 136 mmol/L (ref 135–145)

## 2017-07-05 LAB — CBC
HCT: 50.7 % (ref 39.0–52.0)
Hemoglobin: 17.5 g/dL — ABNORMAL HIGH (ref 13.0–17.0)
MCH: 30.9 pg (ref 26.0–34.0)
MCHC: 34.5 g/dL (ref 30.0–36.0)
MCV: 89.6 fL (ref 78.0–100.0)
PLATELETS: 202 10*3/uL (ref 150–400)
RBC: 5.66 MIL/uL (ref 4.22–5.81)
RDW: 13.5 % (ref 11.5–15.5)
WBC: 8.9 10*3/uL (ref 4.0–10.5)

## 2017-07-05 LAB — SURGICAL PCR SCREEN
MRSA, PCR: NEGATIVE
STAPHYLOCOCCUS AUREUS: NEGATIVE

## 2017-07-05 SURGERY — SHOULDER ARTHROSCOPY WITH DISTAL CLAVICLE RESECTION
Anesthesia: General | Laterality: Left

## 2017-07-05 MED ORDER — FENTANYL CITRATE (PF) 250 MCG/5ML IJ SOLN
INTRAMUSCULAR | Status: AC
  Filled 2017-07-05: qty 5

## 2017-07-05 MED ORDER — ONDANSETRON HCL 4 MG/2ML IJ SOLN
INTRAMUSCULAR | Status: DC | PRN
  Administered 2017-07-05: 4 mg via INTRAVENOUS

## 2017-07-05 MED ORDER — MIDAZOLAM HCL 2 MG/2ML IJ SOLN
INTRAMUSCULAR | Status: AC
  Filled 2017-07-05: qty 2

## 2017-07-05 MED ORDER — DEXAMETHASONE SODIUM PHOSPHATE 10 MG/ML IJ SOLN
INTRAMUSCULAR | Status: AC
  Filled 2017-07-05: qty 1

## 2017-07-05 MED ORDER — PROMETHAZINE HCL 25 MG/ML IJ SOLN
6.2500 mg | INTRAMUSCULAR | Status: DC | PRN
  Administered 2017-07-05: 6.25 mg via INTRAVENOUS

## 2017-07-05 MED ORDER — MIDAZOLAM HCL 5 MG/5ML IJ SOLN
INTRAMUSCULAR | Status: DC | PRN
  Administered 2017-07-05 (×2): 1 mg via INTRAVENOUS

## 2017-07-05 MED ORDER — PHENYLEPHRINE HCL 10 MG/ML IJ SOLN
INTRAVENOUS | Status: DC | PRN
  Administered 2017-07-05: 30 ug/min via INTRAVENOUS

## 2017-07-05 MED ORDER — MIDAZOLAM HCL 2 MG/2ML IJ SOLN
1.0000 mg | Freq: Once | INTRAMUSCULAR | Status: AC
  Administered 2017-07-05: 1 mg via INTRAVENOUS
  Filled 2017-07-05: qty 1

## 2017-07-05 MED ORDER — FENTANYL CITRATE (PF) 100 MCG/2ML IJ SOLN
INTRAMUSCULAR | Status: AC
  Administered 2017-07-05: 100 ug via INTRAVENOUS
  Filled 2017-07-05: qty 2

## 2017-07-05 MED ORDER — SODIUM CHLORIDE 0.9 % IV SOLN: 75.0000 mL/h | INTRAVENOUS | Status: DC

## 2017-07-05 MED ORDER — OXYCODONE HCL 5 MG PO TABS: 5.0000 mg | ORAL_TABLET | Freq: Once | ORAL | Status: DC | PRN

## 2017-07-05 MED ORDER — SODIUM CHLORIDE 0.9 % IR SOLN
Status: DC | PRN
  Administered 2017-07-05: 9000 mL

## 2017-07-05 MED ORDER — FENTANYL CITRATE (PF) 100 MCG/2ML IJ SOLN
100.0000 ug | Freq: Once | INTRAMUSCULAR | Status: AC
  Administered 2017-07-05: 100 ug via INTRAVENOUS
  Filled 2017-07-05: qty 2

## 2017-07-05 MED ORDER — LACTATED RINGERS IV SOLN
INTRAVENOUS | Status: DC
  Administered 2017-07-05 (×2): via INTRAVENOUS

## 2017-07-05 MED ORDER — PROPOFOL 10 MG/ML IV BOLUS
INTRAVENOUS | Status: AC
  Filled 2017-07-05: qty 40

## 2017-07-05 MED ORDER — PROPOFOL 10 MG/ML IV BOLUS
INTRAVENOUS | Status: DC | PRN
  Administered 2017-07-05: 200 mg via INTRAVENOUS
  Administered 2017-07-05: 20 mg via INTRAVENOUS

## 2017-07-05 MED ORDER — MIDAZOLAM HCL 2 MG/2ML IJ SOLN
INTRAMUSCULAR | Status: AC
  Administered 2017-07-05: 1 mg via INTRAVENOUS
  Filled 2017-07-05: qty 2

## 2017-07-05 MED ORDER — OXYCODONE-ACETAMINOPHEN 5-325 MG PO TABS: 1.0000 | ORAL_TABLET | ORAL | 0 refills | Status: DC | PRN

## 2017-07-05 MED ORDER — PROMETHAZINE HCL 25 MG/ML IJ SOLN
INTRAMUSCULAR | Status: AC
  Filled 2017-07-05: qty 1

## 2017-07-05 MED ORDER — CEFAZOLIN SODIUM-DEXTROSE 2-4 GM/100ML-% IV SOLN
INTRAVENOUS | Status: AC
  Filled 2017-07-05: qty 100

## 2017-07-05 MED ORDER — BUPIVACAINE-EPINEPHRINE (PF) 0.5% -1:200000 IJ SOLN
INTRAMUSCULAR | Status: DC | PRN
  Administered 2017-07-05: 25 mL via PERINEURAL

## 2017-07-05 MED ORDER — SUGAMMADEX SODIUM 200 MG/2ML IV SOLN
INTRAVENOUS | Status: AC
  Filled 2017-07-05: qty 2

## 2017-07-05 MED ORDER — SUCCINYLCHOLINE CHLORIDE 20 MG/ML IJ SOLN
INTRAMUSCULAR | Status: DC | PRN
  Administered 2017-07-05: 120 mg via INTRAVENOUS

## 2017-07-05 MED ORDER — OXYCODONE HCL 5 MG/5ML PO SOLN: 5.0000 mg | Freq: Once | ORAL | Status: DC | PRN

## 2017-07-05 MED ORDER — ONDANSETRON HCL 4 MG/2ML IJ SOLN
INTRAMUSCULAR | Status: AC
  Filled 2017-07-05: qty 2

## 2017-07-05 MED ORDER — CEFAZOLIN SODIUM-DEXTROSE 2-4 GM/100ML-% IV SOLN
2.0000 g | INTRAVENOUS | Status: AC
  Administered 2017-07-05: 2 g via INTRAVENOUS

## 2017-07-05 MED ORDER — FENTANYL CITRATE (PF) 100 MCG/2ML IJ SOLN
INTRAMUSCULAR | Status: DC | PRN
  Administered 2017-07-05 (×5): 50 ug via INTRAVENOUS

## 2017-07-05 MED ORDER — LIDOCAINE HCL (CARDIAC) 20 MG/ML IV SOLN
INTRAVENOUS | Status: DC | PRN
  Administered 2017-07-05: 100 mg via INTRAVENOUS

## 2017-07-05 MED ORDER — ROCURONIUM BROMIDE 10 MG/ML (PF) SYRINGE
PREFILLED_SYRINGE | INTRAVENOUS | Status: AC
  Filled 2017-07-05: qty 5

## 2017-07-05 MED ORDER — DEXAMETHASONE SODIUM PHOSPHATE 10 MG/ML IJ SOLN
INTRAMUSCULAR | Status: DC | PRN
  Administered 2017-07-05: 10 mg via INTRAVENOUS

## 2017-07-05 MED ORDER — HYDROMORPHONE HCL 1 MG/ML IJ SOLN: 0.2500 mg | INTRAMUSCULAR | Status: DC | PRN

## 2017-07-05 SURGICAL SUPPLY — 50 items
ANCHOR SUT BIOCOMP LK 2.9X12.5 (Anchor) ×2 IMPLANT
BLADE GREAT WHITE 4.2 (BLADE) ×2 IMPLANT
BUR OVAL 6.0 (BURR) ×2 IMPLANT
CANNULA ACUFLEX KIT 5X76 (CANNULA) ×2 IMPLANT
CANNULA TWIST IN 8.25X7CM (CANNULA) ×2 IMPLANT
COVER SURGICAL LIGHT HANDLE (MISCELLANEOUS) ×2 IMPLANT
DRAPE SHOULDER BEACH CHAIR (DRAPES) ×2 IMPLANT
DRSG EMULSION OIL 3X3 NADH (GAUZE/BANDAGES/DRESSINGS) ×2 IMPLANT
DRSG PAD ABDOMINAL 8X10 ST (GAUZE/BANDAGES/DRESSINGS) ×2 IMPLANT
DURAPREP 26ML APPLICATOR (WOUND CARE) ×2 IMPLANT
ELECT REM PT RETURN 9FT ADLT (ELECTROSURGICAL) ×2
ELECTRODE REM PT RTRN 9FT ADLT (ELECTROSURGICAL) ×1 IMPLANT
GAUZE SPONGE 4X4 12PLY STRL (GAUZE/BANDAGES/DRESSINGS) ×2 IMPLANT
GAUZE SPONGE 4X4 12PLY STRL LF (GAUZE/BANDAGES/DRESSINGS) ×2 IMPLANT
GLOVE BIOGEL PI IND STRL 8 (GLOVE) ×1 IMPLANT
GLOVE BIOGEL PI IND STRL 8.5 (GLOVE) IMPLANT
GLOVE BIOGEL PI INDICATOR 8 (GLOVE) ×1
GLOVE BIOGEL PI INDICATOR 8.5 (GLOVE)
GLOVE ECLIPSE 8.0 STRL XLNG CF (GLOVE) ×2 IMPLANT
GLOVE ECLIPSE 8.5 STRL (GLOVE) IMPLANT
GOWN STRL REUS W/ TWL LRG LVL3 (GOWN DISPOSABLE) ×2 IMPLANT
GOWN STRL REUS W/ TWL XL LVL3 (GOWN DISPOSABLE) ×1 IMPLANT
GOWN STRL REUS W/TWL LRG LVL3 (GOWN DISPOSABLE) ×2
GOWN STRL REUS W/TWL XL LVL3 (GOWN DISPOSABLE) ×1
KIT ELECT NEEDLE MOD JJB (NEEDLE) ×2 IMPLANT
KIT PUSHLOCK 2.9 HIP (KITS) ×2 IMPLANT
KIT ROOM TURNOVER OR (KITS) ×2 IMPLANT
LASSO 90 CVE QUICKPAS (DISPOSABLE) ×2 IMPLANT
MANIFOLD NEPTUNE II (INSTRUMENTS) ×2 IMPLANT
NEEDLE 22X1 1/2 (OR ONLY) (NEEDLE) IMPLANT
NEEDLE SPNL 18GX3.5 QUINCKE PK (NEEDLE) ×2 IMPLANT
NS IRRIG 1000ML POUR BTL (IV SOLUTION) ×2 IMPLANT
PACK SHOULDER (CUSTOM PROCEDURE TRAY) ×2 IMPLANT
PAD ARMBOARD 7.5X6 YLW CONV (MISCELLANEOUS) ×4 IMPLANT
PROBE BIPOLAR ATHRO 135MM 90D (MISCELLANEOUS) ×4 IMPLANT
SET ARTHROSCOPY TUBING (MISCELLANEOUS) ×1
SET ARTHROSCOPY TUBING LN (MISCELLANEOUS) ×1 IMPLANT
SLING ARM IMMOBILIZER LRG (SOFTGOODS) ×2 IMPLANT
STRIP CLOSURE SKIN 1/2X4 (GAUZE/BANDAGES/DRESSINGS) ×2 IMPLANT
SUT ETHIBOND 2 OS 4 DA (SUTURE) IMPLANT
SUT ETHILON 4 0 PS 2 18 (SUTURE) ×2 IMPLANT
SUT MNCRL AB 3-0 PS2 18 (SUTURE) IMPLANT
SUT VIC AB 0 CT1 27 (SUTURE) ×1
SUT VIC AB 0 CT1 27XBRD ANBCTR (SUTURE) ×1 IMPLANT
SYR CONTROL 10ML LL (SYRINGE) IMPLANT
TOWEL OR 17X24 6PK STRL BLUE (TOWEL DISPOSABLE) ×2 IMPLANT
TOWEL OR 17X26 10 PK STRL BLUE (TOWEL DISPOSABLE) ×2 IMPLANT
WAND HAND CNTRL MULTIVAC 90 (MISCELLANEOUS) ×2 IMPLANT
WATER STERILE IRR 1000ML POUR (IV SOLUTION) ×2 IMPLANT
YANKAUER SUCT BULB TIP NO VENT (SUCTIONS) IMPLANT

## 2017-07-05 NOTE — Anesthesia Procedure Notes (Addendum)
Anesthesia Regional Block: Interscalene brachial plexus block   Pre-Anesthetic Checklist: ,, timeout performed, Correct Patient, Correct Site, Correct Laterality, Correct Procedure, Correct Position, site marked, Risks and benefits discussed,  Surgical consent,  Pre-op evaluation,  At surgeon's request and post-op pain management  Laterality: Upper and Left  Prep: Betadine, chloraprep       Needles:  Injection technique: Single-shot  Needle Type: Stimulator Needle - 40     Needle Length: 4cm  Needle Gauge: 21   Needle insertion depth: 3 cm   Additional Needles:   Procedures:,,,, ultrasound used (permanent image in chart),,,,   Nerve Stimulator or Paresthesia:  Response: Twitch elicited, 0.5 mA, 0.3 ms,   Additional Responses:   Narrative:  Start time: 07/05/2017 8:40 AM End time: 07/05/2017 8:54 AM Injection made incrementally with aspirations every 5 mL.  Performed by: Personally  Anesthesiologist: Rica Koyanagi, MD  Additional Notes: Block assessed prior to start of surgery

## 2017-07-05 NOTE — Anesthesia Preprocedure Evaluation (Addendum)
Anesthesia Evaluation  Patient identified by MRN, date of birth, ID band Patient awake    Reviewed: Allergy & Precautions, NPO status , Patient's Chart, lab work & pertinent test results  Airway Mallampati: II  TM Distance: >3 FB Neck ROM: Full    Dental  (+) Teeth Intact   Pulmonary former smoker,    breath sounds clear to auscultation       Cardiovascular hypertension,  Rhythm:Regular Rate:Normal     Neuro/Psych    GI/Hepatic GERD  ,  Endo/Other    Renal/GU      Musculoskeletal  (+) Arthritis ,   Abdominal   Peds  Hematology   Anesthesia Other Findings   Reproductive/Obstetrics                            Anesthesia Physical Anesthesia Plan  ASA: II  Anesthesia Plan: General   Post-op Pain Management:  Regional for Post-op pain   Induction: Intravenous  PONV Risk Score and Plan: 3 and Treatment may vary due to age or medical condition, Dexamethasone and Ondansetron  Airway Management Planned: Oral ETT  Additional Equipment:   Intra-op Plan:   Post-operative Plan: Extubation in OR  Informed Consent: I have reviewed the patients History and Physical, chart, labs and discussed the procedure including the risks, benefits and alternatives for the proposed anesthesia with the patient or authorized representative who has indicated his/her understanding and acceptance.   Dental advisory given  Plan Discussed with: CRNA  Anesthesia Plan Comments:         Anesthesia Quick Evaluation

## 2017-07-05 NOTE — Op Note (Signed)
PATIENT ID:      Keith Barber  MRN:     [id number] DOB/AGE:    1970/12/10 / 46 y.o.       OPERATIVE REPORT    DATE OF PROCEDURE:  07/05/2017       PREOPERATIVE DIAGNOSIS:   Impingement Syndrome, , AC Arthritis Left Shoulder,possible partial rotator cuff tear                                                       Estimated body mass index is 29.65 kg/m as calculated from the following:   Height as of this encounter: 5\' 8"  (1.727 m).   Weight as of this encounter: 195 lb (88.5 kg).     POSTOPERATIVE DIAGNOSIS:   Impingement Syndrome,,  AC Arthritis Left ,partial rotator cuff tear, tear anterior glenoid labrum                                                                  Estimated body mass index is 29.65 kg/m as calculated from the following:   Height as of this encounter: 5\' 8"  (1.727 m).   Weight as of this encounter: 195 lb (88.5 kg).     PROCEDURE:  Procedure(s): LEFT SHOULDER ARTHROSCOPY, SUBACROMIAL DECOMPRESSION, DISTAL CLAVICLE RESECTION, DEBRIDEMENT OF PARTIAL ROTATOR CUFF TEAR, REPAIR ANT GLENOID LABRAL TEAR    SURGEON:  Joni Fears, MD    ASSISTANT:   Biagio Borg, PA-C   (Present and scrubbed throughout the case, critical for assistance with exposure, retraction, instrumentation, and closure.)          ANESTHESIA: regional and general     DRAINS: none :      TOURNIQUET TIME: * No tourniquets in log *    COMPLICATIONS:  None   CONDITION:  stable  PROCEDURE IN DETAIL: [id number]   [name] 07/05/2017, 12:13 PM

## 2017-07-05 NOTE — Telephone Encounter (Signed)
Patient called wanting to know if he could take dressing off of incision.  Patient was advised per B. Petrarca not to remove dressing from incision.

## 2017-07-05 NOTE — Transfer of Care (Signed)
Immediate Anesthesia Transfer of Care Note  Patient: Keith Barber  Procedure(s) Performed: LEFT SHOULDER ARTHROSCOPY, SUBACROMIAL DECOMPRESSION, DISTAL CLAVICLE RESECTION, POSSIBLE MINI OPEN ROTATOR CUFF REPAIR (Left )  Patient Location: PACU  Anesthesia Type:General and Regional  Level of Consciousness: awake, alert  and sedated  Airway & Oxygen Therapy: Patient Spontanous Breathing and Patient connected to nasal cannula oxygen  Post-op Assessment: Report given to RN, Post -op Vital signs reviewed and stable and Patient moving all extremities  Post vital signs: Reviewed and stable  Last Vitals:  Vitals:   07/05/17 0855 07/05/17 1232  BP: (!) 130/94 (!) (P) 123/97  Pulse: 91 (P) 98  Resp: 12 (P) 13  Temp:  (!) (P) 36.2 C  SpO2: 97% (P) 97%    Last Pain:  Vitals:   07/05/17 0819  TempSrc:   PainSc: 3          Complications: No apparent anesthesia complications

## 2017-07-05 NOTE — Anesthesia Postprocedure Evaluation (Signed)
Anesthesia Post Note  Patient: Keith Barber  Procedure(s) Performed: LEFT SHOULDER ARTHROSCOPY, SUBACROMIAL DECOMPRESSION, DISTAL CLAVICLE RESECTION, POSSIBLE MINI OPEN ROTATOR CUFF REPAIR (Left )     Patient location during evaluation: PACU Anesthesia Type: General and Regional Level of consciousness: awake and alert Pain management: pain level controlled Vital Signs Assessment: post-procedure vital signs reviewed and stable Respiratory status: spontaneous breathing, nonlabored ventilation, respiratory function stable and patient connected to nasal cannula oxygen Cardiovascular status: blood pressure returned to baseline and stable Postop Assessment: no apparent nausea or vomiting Anesthetic complications: no    Last Vitals:  Vitals:   07/05/17 1300 07/05/17 1315  BP: 116/87   Pulse: 96 95  Resp: 14 18  Temp:    SpO2: 97% 99%    Last Pain:  Vitals:   07/05/17 1300  TempSrc:   PainSc: 0-No pain                 Ameia Morency,JAMES TERRILL

## 2017-07-05 NOTE — Anesthesia Procedure Notes (Signed)
Procedure Name: Intubation Date/Time: 07/05/2017 10:31 AM Performed by: Izora Gala, CRNA Pre-anesthesia Checklist: Patient identified, Emergency Drugs available, Suction available and Patient being monitored Patient Re-evaluated:Patient Re-evaluated prior to induction Oxygen Delivery Method: Circle system utilized Preoxygenation: Pre-oxygenation with 100% oxygen Induction Type: IV induction Ventilation: Mask ventilation without difficulty and Oral airway inserted - appropriate to patient size Laryngoscope Size: Miller and 3 Grade View: Grade I Tube type: Oral Tube size: 7.5 mm Number of attempts: 1 Airway Equipment and Method: Stylet and LTA kit utilized Placement Confirmation: ETT inserted through vocal cords under direct vision,  positive ETCO2 and breath sounds checked- equal and bilateral Secured at: 22 cm Tube secured with: Tape Dental Injury: Teeth and Oropharynx as per pre-operative assessment

## 2017-07-05 NOTE — H&P (Signed)
The recent History & Physical has been reviewed. I have personally examined the patient today. There is no interval change to the documented History & Physical. The patient would like to proceed with the procedure.  [name] 07/05/2017,  10:11 AM

## 2017-07-06 ENCOUNTER — Telehealth: Payer: Self-pay

## 2017-07-06 ENCOUNTER — Encounter (HOSPITAL_COMMUNITY): Payer: Self-pay | Admitting: Orthopaedic Surgery

## 2017-07-06 NOTE — Op Note (Signed)
NAME:  Keith Barber NO.:  MEDICAL RECORD NO.:  [id number]  LOCATION:                                 FACILITY:  PHYSICIAN:  Vonna Kotyk. Durward Fortes, M.D.    DATE OF BIRTH:  DATE OF PROCEDURE:  06/29/2017 DATE OF DISCHARGE:                              OPERATIVE REPORT   PREOPERATIVE DIAGNOSES: 1. 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 2. Probable partial rotator cuff tear.  POSTOPERATIVE DIAGNOSES: 1. Impingement syndrome, left shoulder. 2. Acromioclavicular joint arthritis, left shoulder. 3. Partial rotator cuff tears. 4. Tear of anterior glenoid labrum.  PROCEDURES: 1. Diagnostic arthroscopy of left shoulder with repair of anterior     glenoid labral tear. 2. Arthroscopic debridement of partial rotator cuff tear. 3. Arthroscopic distal clavicle resection. 4. Partial anterior and lateral acromioplasty with coracoacromial     ligament release.  SURGEON:  Vonna Kotyk. Durward Fortes, MD.  ASSISTANTAaron Edelman D. Petrarca, PA-C.  ANESTHESIA:  General with supplemental interscalene nerve block.  COMPLICATIONS:  None.  DESCRIPTION OF PROCEDURE:  Keith Barber was met in the holding area, identified the left shoulder as appropriate operative site and marked it accordingly.  Anesthesia performed an interscalene nerve block.  The patient was then transferred to room #7 and placed under general anesthesia without difficulty.  He was then placed in the semi-sitting position with a shoulder frame.  Time-out was called.  The whole left upper extremity was then examined under anesthesia without evidence of instability or adhesive capsulitis.  The left shoulder was then prepped with DuraPrep in the base of the neck circumferentially below the elbow.  Sterile draping was performed.  Time- out was called again.  A marking pen was used to outline the [facility] joint, the coracoid and acromion.  At a point of  fingerbreadth posterior and medial to the posterior angle of acromion, a small stab wound was made.  The arthroscope easily placed into the shoulder joint.  Diagnostic arthroscopy demonstrated a partial tear of the supraspinatus near the biceps tendon.  The biceps tendon was intact.  Subscapularis was intact.  There was very minimal degenerative change of the glenohumeral joint.  There was some scuffing along the anterior glenoid associated with a tear at about the 9 o'clock position on the glenoid. A second portal was established anteriorly.  I debrided the partial rotator cuff tear.  I then proceeded to repair the anterior labrum with the Arthrex PushLock anchor system.  After repair, I had a nice apposition of the labrum to the glenoid.  The arthroscope was then placed in subacromial space posteriorly, the cannula in the subacromial space anteriorly and a third portal was established in the lateral subacromial space.  There was both anterior and lateral overhang of the acromion.  An anterior and lateral acromioplasty was performed with a 6-mm bur.  There was a moderate amount of bursal tissue, which was resected with the ArthroCare wand. There was a moderate amount of inflammatory tissue about the acromioclavicular joint.  This was debrided with the ArthroCare wand and a distal clavicle resection was performed with the same 6-mm  bur.  I had a nice flat resection of about a centimeter.  I did not see evidence of rotator cuff tear through the bursal surface.  At that point, the wounds were irrigated with saline solution.  The anterior and lateral wounds were closed with interrupted 4-0 Ethilon. Sterile bulky dressing was applied followed by sling.  PLAN:  Percocet for pain.  Office first of the week.     Vonna Kotyk. Durward Fortes, M.D.     PWW/MEDQ  D:  07/05/2017  T:  07/05/2017  Job:  [id number]

## 2017-07-06 NOTE — Telephone Encounter (Signed)
Patient called stating that he still has numbness in left arm, hand, and fingers.  Would like to know if this is normal and should it last at least 24hrs?  Cb# is [phone].  Please advise.  Thank you.

## 2017-07-07 ENCOUNTER — Telehealth (INDEPENDENT_AMBULATORY_CARE_PROVIDER_SITE_OTHER): Payer: Self-pay

## 2017-07-07 NOTE — Telephone Encounter (Signed)
Spoke with pt

## 2017-07-07 NOTE — Telephone Encounter (Signed)
Called pt, said he has been called several times,. PW called also

## 2017-07-07 NOTE — Telephone Encounter (Signed)
Please advise and I will call.

## 2017-07-08 ENCOUNTER — Encounter (INDEPENDENT_AMBULATORY_CARE_PROVIDER_SITE_OTHER): Payer: Self-pay | Admitting: Orthopaedic Surgery

## 2017-07-08 ENCOUNTER — Ambulatory Visit (INDEPENDENT_AMBULATORY_CARE_PROVIDER_SITE_OTHER): Payer: BLUE CROSS/BLUE SHIELD | Admitting: Orthopaedic Surgery

## 2017-07-08 VITALS — BP 122/77 | HR 91 | Resp 16 | Ht 68.0 in | Wt 195.0 lb

## 2017-07-08 DIAGNOSIS — M25512 Pain in left shoulder: Secondary | ICD-10-CM

## 2017-07-08 DIAGNOSIS — G8929 Other chronic pain: Secondary | ICD-10-CM

## 2017-07-08 NOTE — Telephone Encounter (Signed)
thanks

## 2017-07-08 NOTE — Progress Notes (Signed)
Office Visit Note   Patient: Keith Barber           Date of Birth: 1971-02-22           MRN: [id number] Visit Date: 07/08/2017              Requested by: Volney American, PA-C [place], [place] [id number] PCP: Volney American, PA-C   Assessment & Plan: Visit Diagnoses:  1. Chronic left shoulder pain     Plan: 3 days status post left shoulder arthroscopic distal clavicle resection, subacromial decompression and repair of a SLAP lesion of the anterior glenoid labrum. Doing well. Pain is under control. Wearing a sling. No work we'll reevaluate in 10-14 days and consider therapy at that time  Orders:  No orders of the defined types were placed in this encounter.  No orders of the defined types were placed in this encounter.     Procedures: No procedures performed   Clinical Data: No additional findings.   Subjective: Chief Complaint  Patient presents with  . Left Shoulder - Routine Post Op  . Shoulder Pain    Pain, using ice, [place] surgery 07/05/17, throbbing, difficulty sleeping  Denies fever or chills. No numbness or tingling.  HPI  Review of Systems   Objective: Vital Signs: BP 122/77 (BP Location: Right Arm, Patient Position: Sitting, Cuff Size: Normal)   Pulse 91   Resp 16   Ht 5\' 8"  (1.727 m)   Wt 195 lb (88.5 kg)   BMI 29.65 kg/m   Physical Exam  Ortho Exam left shoulder exam reveals the wounds to be healing nicely. I remove the stitches applied Steri-Strips over benzoin. Neurovascular exam intact. No drainage no obvious evidence of infection.  Specialty Comments:  No specialty comments available.  Imaging: No results found.   PMFS History: Patient Active Problem List   Diagnosis Date Noted  . Impingement syndrome of left shoulder 07/05/2017  . 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  . Umbilical hernia without obstruction and without gangrene   . Bilateral inguinal hernia without obstruction or  gangrene   . Tobacco use 11/26/2016  . Hypertension   . Hyperlipidemia   . Migraines   . Rotator cuff tendinitis, left 11/12/2015  . Labral tear of shoulder, left, initial encounter 11/12/2015   Past Medical History:  Diagnosis Date  . Bone spur    LEFT SHOULDER THAT MAKES HIS LEFT ARM TINGLE  . GERD (gastroesophageal reflux disease)   . Hyperlipidemia   . Hypertension   . Migraines    rare migraines    Family History  Problem Relation Age of Onset  . Diabetes Mother   . Migraines Mother   . Cancer Neg Hx   . COPD Neg Hx   . Heart disease Neg Hx   . Stroke Neg Hx     Past Surgical History:  Procedure Laterality Date  . INGUINAL HERNIA REPAIR Bilateral 01/21/2017   Procedure: LAPAROSCOPIC BILATERAL INGUINAL HERNIA REPAIR;  Surgeon: Jules Husbands, MD;  Location: [facility];  Service: General;  Laterality: Bilateral;  . KNEE SURGERY Left   . SHOULDER ARTHROSCOPY WITH DISTAL CLAVICLE RESECTION Left 07/05/2017   Procedure: LEFT SHOULDER ARTHROSCOPY, SUBACROMIAL DECOMPRESSION, DISTAL CLAVICLE RESECTION, POSSIBLE MINI OPEN ROTATOR CUFF REPAIR;  Surgeon: [name], MD;  Location: [facility];  Service: Orthopedics;  Laterality: Left;  . SHOULDER SURGERY Right   . UMBILICAL HERNIA REPAIR N/A 01/21/2017   Procedure: HERNIA REPAIR UMBILICAL ADULT;  Surgeon: Jules Husbands, MD;  Location: [facility];  Service: General;  Laterality: N/A;   Social History   Occupational History  . Not on file  Tobacco Use  . Smoking status: Former Smoker    Packs/day: 0.50    Years: 26.00    Pack years: 13.00    Types: Cigarettes    Last attempt to quit: 01/13/2017    Years since quitting: 0.4  . Smokeless tobacco: Never Used  . Tobacco comment: PT STARTED TAKING ZYBAN ON 01-08-17 IN HOPES OF QUITTING  Substance and Sexual Activity  . Alcohol use: No  . Drug use: No  . Sexual activity: Not on file     [name], MD   Note - This record has been created using [employer].  Chart  creation errors have been sought, but may not always  have been located. Such creation errors do not reflect on  the standard of medical care.

## 2017-07-15 ENCOUNTER — Encounter (INDEPENDENT_AMBULATORY_CARE_PROVIDER_SITE_OTHER): Payer: Self-pay | Admitting: Orthopaedic Surgery

## 2017-07-15 ENCOUNTER — Encounter: Payer: Self-pay | Admitting: Family Medicine

## 2017-07-15 ENCOUNTER — Other Ambulatory Visit: Payer: Self-pay | Admitting: Family Medicine

## 2017-07-15 ENCOUNTER — Ambulatory Visit (INDEPENDENT_AMBULATORY_CARE_PROVIDER_SITE_OTHER): Payer: BLUE CROSS/BLUE SHIELD | Admitting: Family Medicine

## 2017-07-15 ENCOUNTER — Inpatient Hospital Stay (INDEPENDENT_AMBULATORY_CARE_PROVIDER_SITE_OTHER): Payer: BLUE CROSS/BLUE SHIELD | Admitting: Orthopaedic Surgery

## 2017-07-15 ENCOUNTER — Ambulatory Visit (INDEPENDENT_AMBULATORY_CARE_PROVIDER_SITE_OTHER): Payer: BLUE CROSS/BLUE SHIELD | Admitting: Orthopaedic Surgery

## 2017-07-15 VITALS — BP 122/85 | HR 80 | Wt 195.0 lb

## 2017-07-15 VITALS — BP 122/75 | HR 78 | Resp 16 | Ht 68.0 in | Wt 195.0 lb

## 2017-07-15 DIAGNOSIS — E782 Mixed hyperlipidemia: Secondary | ICD-10-CM

## 2017-07-15 DIAGNOSIS — S43432A Superior glenoid labrum lesion of left shoulder, initial encounter: Secondary | ICD-10-CM

## 2017-07-15 DIAGNOSIS — Z87891 Personal history of nicotine dependence: Secondary | ICD-10-CM

## 2017-07-15 DIAGNOSIS — G8929 Other chronic pain: Secondary | ICD-10-CM

## 2017-07-15 DIAGNOSIS — I10 Essential (primary) hypertension: Secondary | ICD-10-CM

## 2017-07-15 DIAGNOSIS — M25512 Pain in left shoulder: Secondary | ICD-10-CM

## 2017-07-15 NOTE — Progress Notes (Signed)
BP 122/85   Pulse 80   Wt 195 lb (88.5 kg)   SpO2 98%   BMI 29.65 kg/m    Subjective:    Patient ID: Keith Barber, male    DOB: 05-Aug-1970, 46 y.o.   MRN: [id number]  HPI: Keith Barber is a 46 y.o. male  Chief Complaint  Patient presents with  . Follow-up   Patient here today for 6 month f/u. Only taking lisinopril right now, doing very well on that. Not checking home BPs. No CP, SOB, dizziness, HAs. Taking faithfully.  Doing well on crestor with no persisting side effects. Continues to adhere to good dietary changes with less fast food and significantly decreased soda and sweet tea consumption.   Recent shoulder surgery 10 days ago, still in a fair amount of pain and having some swelling at base of neck that he's concerned about right above surgical site. Does not f/u again with surgeon until next Friday.   Last cigarette was at the end of June - feeling good about it, no desire to restart.   Past Medical History:  Diagnosis Date  . Bone spur    LEFT SHOULDER THAT MAKES HIS LEFT ARM TINGLE  . GERD (gastroesophageal reflux disease)   . Hyperlipidemia   . Hypertension   . Migraines    rare migraines   Social History   Socioeconomic History  . Marital status: Divorced    Spouse name: Not on file  . Number of children: Not on file  . Years of education: Not on file  . Highest education level: Not on file  Social Needs  . Financial resource strain: Not on file  . Food insecurity - worry: Not on file  . Food insecurity - inability: Not on file  . Transportation needs - medical: Not on file  . Transportation needs - non-medical: Not on file  Occupational History  . Not on file  Tobacco Use  . Smoking status: Former Smoker    Packs/day: 0.50    Years: 26.00    Pack years: 13.00    Types: Cigarettes    Last attempt to quit: 01/13/2017    Years since quitting: 0.5  . Smokeless tobacco: Never Used  . Tobacco comment: PT STARTED TAKING ZYBAN ON 01-08-17 IN HOPES OF  QUITTING  Substance and Sexual Activity  . Alcohol use: No  . Drug use: No  . Sexual activity: Not on file  Other Topics Concern  . Not on file  Social History Narrative  . Not on file   Relevant past medical, surgical, family and social history reviewed and updated as indicated. Interim medical history since our last visit reviewed. Allergies and medications reviewed and updated.  Review of Systems  Constitutional: Negative.   HENT: Negative.   Respiratory: Negative.   Cardiovascular: Negative.   Gastrointestinal: Negative.   Genitourinary: Negative.   Musculoskeletal: Positive for arthralgias.  Skin: Negative.   Neurological: Negative.   Psychiatric/Behavioral: Negative.     Per HPI unless specifically indicated above     Objective:    BP 122/85   Pulse 80   Wt 195 lb (88.5 kg)   SpO2 98%   BMI 29.65 kg/m   Wt Readings from Last 3 Encounters:  07/15/17 195 lb (88.5 kg)  07/15/17 195 lb (88.5 kg)  07/08/17 195 lb (88.5 kg)    Physical Exam  Constitutional: He is oriented to person, place, and time. He appears well-developed and well-nourished.  HENT:  Head: Atraumatic.  Eyes: Conjunctivae are normal. Pupils are equal, round, and reactive to light.  Neck: Normal range of motion. Neck supple.  Cardiovascular: Normal rate, regular rhythm and normal heart sounds.  Pulmonary/Chest: Effort normal and breath sounds normal. No respiratory distress.  Musculoskeletal: He exhibits edema (at base of left side of neck above surgical site at left shoulder) and tenderness (surgical site and edematous area).  Lymphadenopathy:    He has no cervical adenopathy.  Neurological: He is alert and oriented to person, place, and time.  Skin: Skin is warm and dry.  Bruising left upper arm below surgical site  Psychiatric: He has a normal mood and affect. His behavior is normal.  Nursing note and vitals reviewed.  Results for orders placed or performed during the hospital encounter  of 07/05/17  Surgical pcr screen  Result Value Ref Range   MRSA, PCR NEGATIVE NEGATIVE   Staphylococcus aureus NEGATIVE NEGATIVE  Basic metabolic panel  Result Value Ref Range   Sodium 136 135 - 145 mmol/L   Potassium 3.9 3.5 - 5.1 mmol/L   Chloride 108 101 - 111 mmol/L   CO2 20 (L) 22 - 32 mmol/L   Glucose, Bld 112 (H) 65 - 99 mg/dL   BUN 15 6 - 20 mg/dL   Creatinine, Ser 0.89 0.61 - 1.24 mg/dL   Calcium 8.7 (L) 8.9 - 10.3 mg/dL   GFR calc non Af Amer >60 >60 mL/min   GFR calc Af Amer >60 >60 mL/min   Anion gap 8 5 - 15  CBC  Result Value Ref Range   WBC 8.9 4.0 - 10.5 K/uL   RBC 5.66 4.22 - 5.81 MIL/uL   Hemoglobin 17.5 (H) 13.0 - 17.0 g/dL   HCT 50.7 39.0 - 52.0 %   MCV 89.6 78.0 - 100.0 fL   MCH 30.9 26.0 - 34.0 pg   MCHC 34.5 30.0 - 36.0 g/dL   RDW 13.5 11.5 - 15.5 %   Platelets 202 150 - 400 K/uL      Assessment & Plan:   Problem List Items Addressed This Visit      Cardiovascular and Mediastinum   Hypertension - Primary    Stable and WNL on lisinopril. Continue current regimen      Relevant Orders   Comprehensive metabolic panel     Musculoskeletal and Integument   Labral tear of shoulder, left, initial encounter    Superficial edema above surgical site, does not appear urgent but given pt's concern and pain level recommended calling surgeon and letting them know what was going on. Pt agreeable to this.         Other   Hyperlipidemia    Stable on crestor. Await cholesterol results. Continue current regimen      Relevant Orders   Lipid Panel w/o Chol/HDL Ratio   Comprehensive metabolic panel   History of tobacco abuse    Has been abstinent for 6 months and doing very well. Congratulated success in quitting. Encouraged continued avoidance         Follow up plan: Return in about 6 months (around 01/13/2018) for CPE.

## 2017-07-15 NOTE — Assessment & Plan Note (Signed)
Stable and WNL on lisinopril. Continue current regimen

## 2017-07-15 NOTE — Assessment & Plan Note (Signed)
Has been abstinent for 6 months and doing very well. Congratulated success in quitting. Encouraged continued avoidance

## 2017-07-15 NOTE — Assessment & Plan Note (Signed)
Superficial edema above surgical site, does not appear urgent but given pt's concern and pain level recommended calling surgeon and letting them know what was going on. Pt agreeable to this.

## 2017-07-15 NOTE — Assessment & Plan Note (Signed)
Stable on crestor. Await cholesterol results. Continue current regimen

## 2017-07-15 NOTE — Progress Notes (Signed)
Office Visit Note   Patient: Keith Barber           Date of Birth: June 06, 1971           MRN: [id number] Visit Date: 07/15/2017              Requested by: Volney American, PA-C [place], [place] [address] PCP: Volney American, PA-C   Assessment & Plan: Visit Diagnoses:  1. Chronic left shoulder pain     Plan: 10 days status post arthroscopic debridement of left shoulder with a labral repair. Doing well without complications. Return in 1 week continue with sling and start therapy at that point  Follow-Up Instructions: Return in about 1 week (around 07/22/2017).   Orders:  No orders of the defined types were placed in this encounter.  No orders of the defined types were placed in this encounter.     Procedures: No procedures performed   Clinical Data: No additional findings.   Subjective: Chief Complaint  Patient presents with  . Left Shoulder - Routine Post Op    [name] is a 46 y o S/P 10 days Left shoulder scope. He relates he went to PCP for neck pain and swelling.   No related fever or chills shortness of breath or chest pain.  HPI  Review of Systems  Constitutional: Negative for fatigue.  HENT: Negative for hearing loss.   Respiratory: Negative for apnea, chest tightness and shortness of breath.   Cardiovascular: Negative for chest pain, palpitations and leg swelling.  Gastrointestinal: Negative for blood in stool, constipation and diarrhea.  Genitourinary: Negative for difficulty urinating.  Musculoskeletal: Positive for joint swelling, neck pain and neck stiffness. Negative for arthralgias, back pain and myalgias.  Neurological: Positive for headaches. Negative for weakness and numbness.  Hematological: Does not bruise/bleed easily.  Psychiatric/Behavioral: Negative for sleep disturbance. The patient is not nervous/anxious.      Objective: Vital Signs: BP 122/75   Pulse 78   Resp 16   Ht 5\' 8"  (1.727 m)   Wt 195 lb (88.5 kg)    BMI 29.65 kg/m   Physical Exam  Ortho Exam left shoulder exam notes the wounds to be healing without evidence of infection. Neurovascular exam intact good grip and good release.  Specialty Comments:  No specialty comments available.  Imaging: No results found.   PMFS History: Patient Active Problem List   Diagnosis Date Noted  . Impingement syndrome of left shoulder 07/05/2017  . 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  . Umbilical hernia without obstruction and without gangrene   . Bilateral inguinal hernia without obstruction or gangrene   . Tobacco use 11/26/2016  . Hypertension   . Hyperlipidemia   . Migraines   . Rotator cuff tendinitis, left 11/12/2015  . Labral tear of shoulder, left, initial encounter 11/12/2015   Past Medical History:  Diagnosis Date  . Bone spur    LEFT SHOULDER THAT MAKES HIS LEFT ARM TINGLE  . GERD (gastroesophageal reflux disease)   . Hyperlipidemia   . Hypertension   . Migraines    rare migraines    Family History  Problem Relation Age of Onset  . Diabetes Mother   . Migraines Mother   . Cancer Neg Hx   . COPD Neg Hx   . Heart disease Neg Hx   . Stroke Neg Hx     Past Surgical History:  Procedure Laterality Date  . INGUINAL HERNIA REPAIR Bilateral 01/21/2017  Procedure: LAPAROSCOPIC BILATERAL INGUINAL HERNIA REPAIR;  Surgeon: Jules Husbands, MD;  Location: [facility];  Service: General;  Laterality: Bilateral;  . KNEE SURGERY Left   . SHOULDER ARTHROSCOPY WITH DISTAL CLAVICLE RESECTION Left 07/05/2017   Procedure: LEFT SHOULDER ARTHROSCOPY, SUBACROMIAL DECOMPRESSION, DISTAL CLAVICLE RESECTION, POSSIBLE MINI OPEN ROTATOR CUFF REPAIR;  Surgeon: [name], MD;  Location: [facility];  Service: Orthopedics;  Laterality: Left;  . SHOULDER SURGERY Right   . UMBILICAL HERNIA REPAIR N/A 01/21/2017   Procedure: HERNIA REPAIR UMBILICAL ADULT;  Surgeon: Jules Husbands, MD;  Location: [facility];  Service: General;   Laterality: N/A;   Social History   Occupational History  . Not on file  Tobacco Use  . Smoking status: Former Smoker    Packs/day: 0.50    Years: 26.00    Pack years: 13.00    Types: Cigarettes    Last attempt to quit: 01/13/2017    Years since quitting: 0.5  . Smokeless tobacco: Never Used  . Tobacco comment: PT STARTED TAKING ZYBAN ON 01-08-17 IN HOPES OF QUITTING  Substance and Sexual Activity  . Alcohol use: No  . Drug use: No  . Sexual activity: Not on file

## 2017-07-15 NOTE — Patient Instructions (Signed)
Follow up in 6 months 

## 2017-07-20 LAB — COMPREHENSIVE METABOLIC PANEL
ALBUMIN: 4.4 g/dL (ref 3.5–5.5)
ALT: 51 IU/L — ABNORMAL HIGH (ref 0–44)
AST: 24 IU/L (ref 0–40)
Albumin/Globulin Ratio: 1.6 (ref 1.2–2.2)
Alkaline Phosphatase: 125 IU/L — ABNORMAL HIGH (ref 39–117)
BUN / CREAT RATIO: 17 (ref 9–20)
BUN: 15 mg/dL (ref 6–24)
Bilirubin Total: 0.5 mg/dL (ref 0.0–1.2)
CO2: 20 mmol/L (ref 20–29)
CREATININE: 0.88 mg/dL (ref 0.76–1.27)
Calcium: 8.8 mg/dL (ref 8.7–10.2)
Chloride: 105 mmol/L (ref 96–106)
GFR, EST AFRICAN AMERICAN: 118 mL/min/{1.73_m2} (ref >59)
GFR, EST NON AFRICAN AMERICAN: 102 mL/min/{1.73_m2} (ref >59)
GLOBULIN, TOTAL: 2.7 g/dL (ref 1.5–4.5)
GLUCOSE: 110 mg/dL — AB (ref 65–99)
Potassium: 4.3 mmol/L (ref 3.5–5.2)
SODIUM: 141 mmol/L (ref 134–144)
TOTAL PROTEIN: 7.1 g/dL (ref 6.0–8.5)

## 2017-07-20 LAB — LIPID PANEL W/O CHOL/HDL RATIO
CHOLESTEROL TOTAL: 148 mg/dL (ref 100–199)
HDL: 32 mg/dL — ABNORMAL LOW (ref >39)
LDL CALC: 78 mg/dL (ref 0–99)
Triglycerides: 189 mg/dL — ABNORMAL HIGH (ref 0–149)
VLDL CHOLESTEROL CAL: 38 mg/dL (ref 5–40)

## 2017-07-22 ENCOUNTER — Ambulatory Visit (INDEPENDENT_AMBULATORY_CARE_PROVIDER_SITE_OTHER): Payer: BLUE CROSS/BLUE SHIELD | Admitting: Orthopaedic Surgery

## 2017-07-22 ENCOUNTER — Other Ambulatory Visit (INDEPENDENT_AMBULATORY_CARE_PROVIDER_SITE_OTHER): Payer: Self-pay

## 2017-07-22 ENCOUNTER — Encounter (INDEPENDENT_AMBULATORY_CARE_PROVIDER_SITE_OTHER): Payer: Self-pay | Admitting: Orthopaedic Surgery

## 2017-07-22 VITALS — BP 129/85 | HR 92 | Resp 14 | Ht 69.0 in | Wt 200.0 lb

## 2017-07-22 DIAGNOSIS — M25512 Pain in left shoulder: Principal | ICD-10-CM

## 2017-07-22 DIAGNOSIS — G8929 Other chronic pain: Secondary | ICD-10-CM

## 2017-07-22 NOTE — Progress Notes (Deleted)
Office Visit Note   Patient: Keith Barber           Date of Birth: 03-Dec-1970           MRN: [id number] Visit Date: 07/22/2017              Requested by: Volney American, PA-C [place], [place] [id number] PCP: Volney American, [place]   Assessment & Plan: Visit Diagnoses: No diagnosis found.  Plan: ***  Follow-Up Instructions: No Follow-up on file.   Orders:  No orders of the defined types were placed in this encounter.  No orders of the defined types were placed in this encounter.     Procedures: No procedures performed   Clinical Data: No additional findings.   Subjective: Chief Complaint  Patient presents with  . Left Shoulder - Routine Post Op    [name] is a 47 y o S/P 2 weeks left shoulder arthroscopy. He relates his shoulder feel better but has limited ROM and stiffness.    HPI  Review of Systems  Constitutional: Negative for fatigue.  HENT: Negative for hearing loss.   Respiratory: Negative for apnea, chest tightness and shortness of breath.   Cardiovascular: Negative for chest pain, palpitations and leg swelling.  Gastrointestinal: Negative for blood in stool, constipation and diarrhea.  Genitourinary: Negative for difficulty urinating.  Musculoskeletal: Positive for neck pain. Negative for arthralgias, back pain, joint swelling, myalgias and neck stiffness.  Neurological: Negative for weakness, numbness and headaches.  Hematological: Does not bruise/bleed easily.  Psychiatric/Behavioral: Negative for sleep disturbance. The patient is not nervous/anxious.      Objective: Vital Signs: BP 129/85   Pulse 92   Resp 14   Ht 5\' 9"  (1.753 m)   Wt 200 lb (90.7 kg)   BMI 29.53 kg/m   Physical Exam  Ortho Exam  Specialty Comments:  No specialty comments available.  Imaging: No results found.   PMFS History: Patient Active Problem List   Diagnosis Date Noted  . Impingement syndrome of left shoulder 07/05/2017  .  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  . Umbilical hernia without obstruction and without gangrene   . Bilateral inguinal hernia without obstruction or gangrene   . History of tobacco abuse 11/26/2016  . Hypertension   . Hyperlipidemia   . Migraines   . Rotator cuff tendinitis, left 11/12/2015  . Labral tear of shoulder, left, initial encounter 11/12/2015   Past Medical History:  Diagnosis Date  . Bone spur    LEFT SHOULDER THAT MAKES HIS LEFT ARM TINGLE  . GERD (gastroesophageal reflux disease)   . Hyperlipidemia   . Hypertension   . Migraines    rare migraines    Family History  Problem Relation Age of Onset  . Diabetes Mother   . Migraines Mother   . Cancer Neg Hx   . COPD Neg Hx   . Heart disease Neg Hx   . Stroke Neg Hx     Past Surgical History:  Procedure Laterality Date  . INGUINAL HERNIA REPAIR Bilateral 01/21/2017   Procedure: LAPAROSCOPIC BILATERAL INGUINAL HERNIA REPAIR;  Surgeon: Jules Husbands, MD;  Location: [facility];  Service: General;  Laterality: Bilateral;  . KNEE SURGERY Left   . SHOULDER ARTHROSCOPY WITH DISTAL CLAVICLE RESECTION Left 07/05/2017   Procedure: LEFT SHOULDER ARTHROSCOPY, SUBACROMIAL DECOMPRESSION, DISTAL CLAVICLE RESECTION, POSSIBLE MINI OPEN ROTATOR CUFF REPAIR;  Surgeon: [name], MD;  Location: [facility];  Service: Orthopedics;  Laterality: Left;  . SHOULDER SURGERY Right   . UMBILICAL HERNIA REPAIR N/A 01/21/2017   Procedure: HERNIA REPAIR UMBILICAL ADULT;  Surgeon: Jules Husbands, MD;  Location: [facility];  Service: General;  Laterality: N/A;   Social History   Occupational History  . Not on file  Tobacco Use  . Smoking status: Former Smoker    Packs/day: 0.50    Years: 26.00    Pack years: 13.00    Types: Cigarettes    Last attempt to quit: 01/13/2017    Years since quitting: 0.5  . Smokeless tobacco: Never Used  . Tobacco comment: PT STARTED TAKING ZYBAN ON 01-08-17 IN HOPES OF QUITTING  Substance  and Sexual Activity  . Alcohol use: No  . Drug use: No  . Sexual activity: Not on file

## 2017-07-22 NOTE — Progress Notes (Signed)
Office Visit Note   Patient: Keith Barber           Date of Birth: 06/20/1971           MRN: [id number] Visit Date: 07/22/2017              Requested by: Volney American, PA-C [place], [place] [address] PCP: Volney American, PA-C   Assessment & Plan: Visit Diagnoses:  1. Chronic left shoulder pain     Plan: 3 weeks status post arthroscopic subacromial decompression, distal clavicle resection and labral repair doing well. We'll start physical therapy and may have her return in 2 weeks. Continue with the sling. Okay to drive  Follow-Up Instructions: Return in about 2 weeks (around 08/05/2017).   Orders:  No orders of the defined types were placed in this encounter.  No orders of the defined types were placed in this encounter.     Procedures: No procedures performed   Clinical Data: No additional findings.   Subjective: Chief Complaint  Patient presents with  . Left Shoulder - Routine Post Op    Keith Barber is a 47 y o S/P 2 weeks left shoulder arthroscopy. He relates his shoulder feel better but has limited ROM and stiffness.  No related fever or chills shortness of breath or chest pain. No numbness or tingling in the left upper extremity. Experiences some "stiffness" and otherwise doing well.  Review of Systems   Objective: Vital Signs: BP 129/85   Pulse 92   Resp 14   Ht 5\' 9"  (1.753 m)   Wt 200 lb (90.7 kg)   BMI 29.53 kg/m   Physical Exam  Ortho Exam awake alert and oriented 3. Comfortable sitting. Examination left shoulder reveals the arthroscopic portals to be healing without problem. Neurovascular exam intact. Passively abduct and flex about 90 mild tenderness about the acromioclavicular joint  Specialty Comments:  No specialty comments available.  Imaging: No results found.   PMFS History: Patient Active Problem List   Diagnosis Date Noted  . Impingement syndrome of left shoulder 07/05/2017  . Osteoarthritis of left  AC (acromioclavicular) joint 07/05/2017  . Umbilical hernia without obstruction and without gangrene   . Bilateral inguinal hernia without obstruction or gangrene   . History of tobacco abuse 11/26/2016  . Hypertension   . Hyperlipidemia   . Migraines   . Rotator cuff tendinitis, left 11/12/2015  . Labral tear of shoulder, left, initial encounter 11/12/2015   Past Medical History:  Diagnosis Date  . Bone spur    LEFT SHOULDER THAT MAKES HIS LEFT ARM TINGLE  . GERD (gastroesophageal reflux disease)   . Hyperlipidemia   . Hypertension   . Migraines    rare migraines    Family History  Problem Relation Age of Onset  . Diabetes Mother   . Migraines Mother   . Cancer Neg Hx   . COPD Neg Hx   . Heart disease Neg Hx   . Stroke Neg Hx     Past Surgical History:  Procedure Laterality Date  . INGUINAL HERNIA REPAIR Bilateral 01/21/2017   Procedure: LAPAROSCOPIC BILATERAL INGUINAL HERNIA REPAIR;  Surgeon: Jules Husbands, MD;  Location: [facility];  Service: General;  Laterality: Bilateral;  . KNEE SURGERY Left   . SHOULDER ARTHROSCOPY WITH DISTAL CLAVICLE RESECTION Left 07/05/2017   Procedure: LEFT SHOULDER ARTHROSCOPY, SUBACROMIAL DECOMPRESSION, DISTAL CLAVICLE RESECTION, POSSIBLE MINI OPEN ROTATOR CUFF REPAIR;  Surgeon: [name], MD;  Location: [facility];  Service: Orthopedics;  Laterality: Left;  . SHOULDER SURGERY Right   . UMBILICAL HERNIA REPAIR N/A 01/21/2017   Procedure: HERNIA REPAIR UMBILICAL ADULT;  Surgeon: Jules Husbands, MD;  Location: [facility];  Service: General;  Laterality: N/A;   Social History   Occupational History  . Not on file  Tobacco Use  . Smoking status: Former Smoker    Packs/day: 0.50    Years: 26.00    Pack years: 13.00    Types: Cigarettes    Last attempt to quit: 01/13/2017    Years since quitting: 0.5  . Smokeless tobacco: Never Used  . Tobacco comment: PT STARTED TAKING ZYBAN ON 01-08-17 IN HOPES OF QUITTING  Substance and Sexual Activity  .  Alcohol use: No  . Drug use: No  . Sexual activity: Not on file     [name], MD   Note - This record has been created using [employer].  Chart creation errors have been sought, but may not always  have been located. Such creation errors do not reflect on  the standard of medical care.

## 2017-07-26 ENCOUNTER — Encounter (HOSPITAL_COMMUNITY): Payer: Self-pay | Admitting: Orthopaedic Surgery

## 2017-07-26 ENCOUNTER — Ambulatory Visit: Payer: BLUE CROSS/BLUE SHIELD | Attending: Orthopaedic Surgery | Admitting: Physical Therapy

## 2017-07-26 DIAGNOSIS — M25512 Pain in left shoulder: Secondary | ICD-10-CM | POA: Insufficient documentation

## 2017-07-26 DIAGNOSIS — M25612 Stiffness of left shoulder, not elsewhere classified: Secondary | ICD-10-CM | POA: Diagnosis present

## 2017-07-26 DIAGNOSIS — R600 Localized edema: Secondary | ICD-10-CM

## 2017-07-26 DIAGNOSIS — M6281 Muscle weakness (generalized): Secondary | ICD-10-CM | POA: Diagnosis present

## 2017-07-27 NOTE — Therapy (Signed)
[place], [name], [address] Phone: [phone]   Fax:  [phone]  Physical Therapy Evaluation  Patient Details  Name: Keith Barber MRN: [id number] Date of Birth: 04-01-1971 Referring Provider: Joni Fears, MD    Encounter Date: 07/26/2017  PT End of Session - 07/26/17 1142    Visit Number  1    Number of Visits  16    Date for PT Re-Evaluation  09/20/17    PT Start Time  1100    PT Stop Time  1150    PT Time Calculation (min)  50 min    Activity Tolerance  Patient limited by pain    Behavior During Therapy  [facility] for tasks assessed/performed       Past Medical History:  Diagnosis Date  . Bone spur    LEFT SHOULDER THAT MAKES HIS LEFT ARM TINGLE  . GERD (gastroesophageal reflux disease)   . Hyperlipidemia   . Hypertension   . Migraines    rare migraines    Past Surgical History:  Procedure Laterality Date  . INGUINAL HERNIA REPAIR Bilateral 01/21/2017   Procedure: LAPAROSCOPIC BILATERAL INGUINAL HERNIA REPAIR;  Surgeon: Jules Husbands, MD;  Location: [facility];  Service: General;  Laterality: Bilateral;  . KNEE SURGERY Left   . SHOULDER ARTHROSCOPY WITH DISTAL CLAVICLE RESECTION Left 07/05/2017   Procedure: LEFT SHOULDER ARTHROSCOPY, SUBACROMIAL DECOMPRESSION, DISTAL CLAVICLE RESECTION, POSSIBLE MINI OPEN ROTATOR CUFF REPAIR;  Surgeon: [name], MD;  Location: [facility];  Service: Orthopedics;  Laterality: Left;  . SHOULDER SURGERY Right   . UMBILICAL HERNIA REPAIR N/A 01/21/2017   Procedure: HERNIA REPAIR UMBILICAL ADULT;  Surgeon: Jules Husbands, MD;  Location: [facility];  Service: General;  Laterality: N/A;    There were no vitals filed for this visit.   Subjective Assessment - 07/26/17 1110    Subjective  Pt underwent arthroscopic surgery (subacromial decompression, distal clavicle resection) for L impingement and OA .  No complications.  He reports a pocket of swelling on the side  of his neck, above clavicle.  He was recently cleared to drive and work.   Wears sling at work. He has pain in shoulder, mild tingling in arm.  He is unable to use his L arm for work duties, basic ADLs, mobility, dressing and grooming.  He was given pendulum exercises but only did a few times.      Pertinent History  Rt. shoulder surgery , HTN, GERD , knee surgery    Limitations  Lifting;House hold activities;Other (comment) work , self care , positioning for sleep, changing positions     Patient Stated Goals  Pt would like to return to normal use of L UE     Currently in Pain?  Yes    Pain Score  8  severe depending on positioning    Pain Location  Shoulder    Pain Orientation  Left;Proximal    Pain Descriptors / Indicators  Sharp;Aching    Pain Type  Surgical pain    Pain Onset  1 to 4 weeks ago    Pain Frequency  Intermittent    Aggravating Factors   using arm, malposition    Pain Relieving Factors  Aleve, sling but then really hurts after he takes it off , positioning    Effect of Pain on Daily Activities  pain, normal ADLs       Objective measurements completed on examination: See above findings.    PT  Education - 07/26/17 1142    Education provided  Yes    Education Details  PT/POC, HEP, technique for pendulum AAROM and use of ice for pain, swelling.     Person(s) Educated  Patient    Methods  Explanation;Handout    Comprehension  Verbalized understanding;Returned demonstration;Verbal cues required;Need further instruction       PT Short Term Goals - 07/26/17 1143      PT SHORT TERM GOAL #1   Title  Patient will be I with initial HEP for Lt UE ROM and strength     Time  4    Period  Weeks    Status  New    Target Date  08/23/17      PT SHORT TERM GOAL #2   Title  Pt will be able to sit for work for typing and dispatching with min pain overall     Time  4    Period  Weeks    Status  New    Target Date  08/23/17      PT SHORT TERM GOAL #3   Title  Pt will be able to  demo full PROM in LUE with min pain     Time  4    Period  Weeks    Status  New    Target Date  08/23/17      PT SHORT TERM GOAL #4   Title  Pt will demo less pain overall (25%) with normal mobility tasks with assist of Rt. UE      Time  4    Period  Weeks    Status  New    Target Date  08/23/17        PT Long Term Goals - 07/27/17 1348      PT LONG TERM GOAL #1   Title  Pt will be I with more advanced HEP for L UE     Time  8    Period  Weeks    Status  New    Target Date  09/20/17      PT LONG TERM GOAL #2   Title  Pt will score FOTO less than 40% to demo improved functional mobilty.     Time  8    Period  Weeks    Status  New    Target Date  09/20/17      PT LONG TERM GOAL #3   Title  Pt will be able to raise arm >130 deg for improved overhead reaching anf household tasks.     Time  8    Period  Weeks    Status  New    Target Date  09/20/17      PT LONG TERM GOAL #4   Title  Pt will demo strength in L UE in 4+/5 throughout for safety with lifting, maximizing functional use in and out of the home.     Time  8    Period  Weeks    Status  New    Target Date  09/20/17      PT LONG TERM GOAL #5   Title  Pt will report no difficulty with grooming and ADLs, to include reaching to lower back and behind head.     Time  8    Period  Weeks    Status  New    Target Date  09/20/17             Plan - 07/27/17 1352  Clinical Impression Statement  Pt presents for low complexity eval of L shoulder following arthroscopic repair (SAD, DCR and anterior labral repair).  He had severe pain today with AAROM and PROM , worked in a sitting position to mobilize shoulder.  He was shown proper form for pendulums.  He reports mentioning swelling to MD, will notify MD if there are any changes.  He should do well once pain is better controlled, advised ice and sling, pillows for comfort and consistent gentle ROM.     Clinical Presentation  Stable    Clinical Decision Making  Low     Rehab Potential  Excellent    PT Frequency  2x / week    PT Duration  8 weeks    PT Treatment/Interventions  ADLs/Self Care Home Management;Cryotherapy;Ultrasound;Moist Heat;Electrical Stimulation;Therapeutic exercise;Therapeutic activities;Functional mobility training;Neuromuscular re-education;Patient/family education;Passive range of motion;Vasopneumatic Device;Taping    PT Next Visit Plan  progress AAROM as tolerated, try UE ranger, modaities as needed    PT Home Exercise Plan  AAROM table slides and pendulums    Consulted and Agree with Plan of Care  Patient       Patient will benefit from skilled therapeutic intervention in order to improve the following deficits and impairments:  Decreased mobility, Impaired sensation, Improper body mechanics, Increased edema, Decreased range of motion, Decreased activity tolerance, Decreased strength, Increased fascial restricitons, Impaired flexibility, Impaired UE functional use, Postural dysfunction, Pain  Visit Diagnosis: Acute pain of left shoulder  Localized edema  Stiffness of left shoulder, not elsewhere classified  Muscle weakness (generalized)     Problem List Patient Active Problem List   Diagnosis Date Noted  . Impingement syndrome of left shoulder 07/05/2017  . 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  . Umbilical hernia without obstruction and without gangrene   . Bilateral inguinal hernia without obstruction or gangrene   . History of tobacco abuse 11/26/2016  . Hypertension   . Hyperlipidemia   . Migraines   . Rotator cuff tendinitis, left 11/12/2015  . Labral tear of shoulder, left, initial encounter 11/12/2015    Keith Barber 07/27/2017, 2:00 PM  [place], [name], [address] Phone: [phone]   Fax:  [phone]  Name: Keith Barber MRN: [id number] Date of Birth: 05-04-1971   Raeford Razor, PT 07/27/17 2:01  PM Phone: [phone] Fax: [phone]

## 2017-07-27 NOTE — Patient Instructions (Signed)
Gave patient from cabinet , AAROM table slides and pendulum/sling exercise

## 2017-08-02 ENCOUNTER — Ambulatory Visit: Payer: BLUE CROSS/BLUE SHIELD | Admitting: Physical Therapy

## 2017-08-02 ENCOUNTER — Encounter: Payer: Self-pay | Admitting: Physical Therapy

## 2017-08-02 DIAGNOSIS — M6281 Muscle weakness (generalized): Secondary | ICD-10-CM

## 2017-08-02 DIAGNOSIS — M25612 Stiffness of left shoulder, not elsewhere classified: Secondary | ICD-10-CM

## 2017-08-02 DIAGNOSIS — M25512 Pain in left shoulder: Secondary | ICD-10-CM | POA: Diagnosis not present

## 2017-08-02 DIAGNOSIS — R600 Localized edema: Secondary | ICD-10-CM

## 2017-08-02 NOTE — Therapy (Signed)
[place], [name], [address] Phone: [phone]   Fax:  [phone]  Physical Therapy Treatment  Patient Details  Name: Keith Barber MRN: [id number] Date of Birth: 1970-11-02 Referring Provider: Joni Fears, MD    Encounter Date: 08/02/2017  PT End of Session - 08/02/17 1140    Visit Number  2    Number of Visits  16    Date for PT Re-Evaluation  09/20/17    PT Start Time  1102    PT Stop Time  1145    PT Time Calculation (min)  43 min    Activity Tolerance  Patient tolerated treatment well    Behavior During Therapy  [facility] for tasks assessed/performed       Past Medical History:  Diagnosis Date  . Bone spur    LEFT SHOULDER THAT MAKES HIS LEFT ARM TINGLE  . GERD (gastroesophageal reflux disease)   . Hyperlipidemia   . Hypertension   . Migraines    rare migraines    Past Surgical History:  Procedure Laterality Date  . INGUINAL HERNIA REPAIR Bilateral 01/21/2017   Procedure: LAPAROSCOPIC BILATERAL INGUINAL HERNIA REPAIR;  Surgeon: Jules Husbands, MD;  Location: [facility];  Service: General;  Laterality: Bilateral;  . KNEE SURGERY Left   . SHOULDER ARTHROSCOPY WITH DISTAL CLAVICLE RESECTION Left 07/05/2017   Procedure: LEFT SHOULDER ARTHROSCOPY, SUBACROMIAL DECOMPRESSION, DISTAL CLAVICLE RESECTION, POSSIBLE MINI OPEN ROTATOR CUFF REPAIR;  Surgeon: [name], MD;  Location: [facility];  Service: Orthopedics;  Laterality: Left;  . SHOULDER SURGERY Right   . UMBILICAL HERNIA REPAIR N/A 01/21/2017   Procedure: HERNIA REPAIR UMBILICAL ADULT;  Surgeon: Jules Husbands, MD;  Location: [facility];  Service: General;  Laterality: N/A;    There were no vitals filed for this visit.  Subjective Assessment - 08/02/17 1106    Subjective  Patient reports reaching better, less pain overall.  Tired today, up early today. Hasn't working sling, is just real careful.     Currently in Pain?  Yes    Pain Score  3      Pain Location  Shoulder    Pain Orientation  Left    Pain Descriptors / Indicators  Dull;Aching    Pain Type  Surgical pain    Pain Onset  1 to 4 weeks ago    Pain Frequency  Intermittent           [facility] Adult PT Treatment/Exercise - 08/02/17 0001      Self-Care   Self-Care  Heat/Ice Application;Other Self-Care Comments    Other Self-Care Comments   caution with UE ROM AAROm only       Shoulder Exercises: Seated   Retraction  Strengthening;Both;10 reps    External Rotation  AAROM;Left;10 reps table slide    Flexion  AAROM;Both;10 reps    Abduction  AAROM;Left;10 reps    Other Seated Exercises  shoulder rolls x 10       Shoulder Exercises: Pulleys   Flexion  2 minutes    ABduction  2 minutes    Other Pulley Exercises  standing IR too painful, limited to L hip       Shoulder Exercises: ROM/Strengthening   Pendulum  2 min all directions, increased time for instruction    Other ROM/Strengthening Exercises  seated UE Ranger forward flexion, circles and horiz abd, add       Modalities   Modalities  Moist Heat  Moist Heat Therapy   Number Minutes Moist Heat  10 Minutes    Moist Heat Location  Shoulder      Vasopneumatic   Number Minutes Vasopneumatic   --    Vasopnuematic Location   --    Vasopneumatic Pressure  --    Vasopneumatic Temperature   --      Manual Therapy   Manual Therapy  Passive ROM    Passive ROM  L UE all planes, pain with return to neutral, abduction especially              PT Education - 08/02/17 1140    Education provided  Yes    Education Details  caution, self care, HEP    Person(s) Educated  Patient    Methods  Explanation    Comprehension  Verbalized understanding;Need further instruction       PT Short Term Goals - 07/26/17 1143      PT SHORT TERM GOAL #1   Title  Patient will be I with initial HEP for Lt UE ROM and strength     Time  4    Period  Weeks    Status  New    Target Date  08/23/17      PT SHORT TERM GOAL  #2   Title  Pt will be able to sit for work for typing and dispatching with min pain overall     Time  4    Period  Weeks    Status  New    Target Date  08/23/17      PT SHORT TERM GOAL #3   Title  Pt will be able to demo full PROM in LUE with min pain     Time  4    Period  Weeks    Status  New    Target Date  08/23/17      PT SHORT TERM GOAL #4   Title  Pt will demo less pain overall (25%) with normal mobility tasks with assist of Rt. UE      Time  4    Period  Weeks    Status  New    Target Date  08/23/17        PT Long Term Goals - 07/27/17 1348      PT LONG TERM GOAL #1   Title  Pt will be I with more advanced HEP for L UE     Time  8    Period  Weeks    Status  New    Target Date  09/20/17      PT LONG TERM GOAL #2   Title  Pt will score FOTO less than 40% to demo improved functional mobilty.     Time  8    Period  Weeks    Status  New    Target Date  09/20/17      PT LONG TERM GOAL #3   Title  Pt will be able to raise arm >130 deg for improved overhead reaching anf household tasks.     Time  8    Period  Weeks    Status  New    Target Date  09/20/17      PT LONG TERM GOAL #4   Title  Pt will demo strength in L UE in 4+/5 throughout for safety with lifting, maximizing functional use in and out of the home.     Time  8    Period  Weeks    Status  New    Target Date  09/20/17      PT LONG TERM GOAL #5   Title  Pt will report no difficulty with grooming and ADLs, to include reaching to lower back and behind head.     Time  8    Period  Weeks    Status  New    Target Date  09/20/17            Plan - 08/02/17 1141    Clinical Impression Statement  Patient tolerated PROM much better today.  He has increased pain as he eccentrically returns down by his side. He prefers heat to ice to relieve stiffness.  He needs reinforcement for going gently with ROM and strength.      PT Treatment/Interventions  ADLs/Self Care Home  Management;Cryotherapy;Ultrasound;Moist Heat;Electrical Stimulation;Therapeutic exercise;Therapeutic activities;Functional mobility training;Neuromuscular re-education;Patient/family education;Passive range of motion;Vasopneumatic Device;Taping    PT Next Visit Plan  progress AAROM as tolerated, try UE ranger, modaities as needed    PT Home Exercise Plan  AAROM table slides and pendulums    Consulted and Agree with Plan of Care  Patient       Patient will benefit from skilled therapeutic intervention in order to improve the following deficits and impairments:  Decreased mobility, Impaired sensation, Improper body mechanics, Increased edema, Decreased range of motion, Decreased activity tolerance, Decreased strength, Increased fascial restricitons, Impaired flexibility, Impaired UE functional use, Postural dysfunction, Pain  Visit Diagnosis: Acute pain of left shoulder  Localized edema  Stiffness of left shoulder, not elsewhere classified  Muscle weakness (generalized)     Problem List Patient Active Problem List   Diagnosis Date Noted  . Impingement syndrome of left shoulder 07/05/2017  . 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  . Umbilical hernia without obstruction and without gangrene   . Bilateral inguinal hernia without obstruction or gangrene   . History of tobacco abuse 11/26/2016  . Hypertension   . Hyperlipidemia   . Migraines   . Rotator cuff tendinitis, left 11/12/2015  . Labral tear of shoulder, left, initial encounter 11/12/2015    Lyndi Holbein 08/02/2017, 11:44 AM  [place], [name], [address] Phone: [phone]   Fax:  [phone]  Name: Hancel Ion MRN: [id number] Date of Birth: 1971-01-13  Raeford Razor, PT 08/02/17 11:44 AM Phone: [phone] Fax: [phone]

## 2017-08-05 ENCOUNTER — Ambulatory Visit (INDEPENDENT_AMBULATORY_CARE_PROVIDER_SITE_OTHER): Payer: BLUE CROSS/BLUE SHIELD | Admitting: Orthopaedic Surgery

## 2017-08-05 ENCOUNTER — Encounter (INDEPENDENT_AMBULATORY_CARE_PROVIDER_SITE_OTHER): Payer: Self-pay | Admitting: Orthopaedic Surgery

## 2017-08-05 VITALS — BP 140/94 | HR 87 | Resp 14 | Ht 68.0 in | Wt 200.0 lb

## 2017-08-05 DIAGNOSIS — M25512 Pain in left shoulder: Secondary | ICD-10-CM

## 2017-08-05 DIAGNOSIS — G8929 Other chronic pain: Secondary | ICD-10-CM

## 2017-08-05 MED ORDER — METHOCARBAMOL 500 MG PO TABS: ORAL_TABLET | ORAL | 0 refills | Status: DC

## 2017-08-05 NOTE — Progress Notes (Signed)
Office Visit Note   Patient: Keith Barber           Date of Birth: August 26, 1970           MRN: [id number] Visit Date: 08/05/2017              Requested by: Volney American, PA-C [place], [place] [address] PCP: Volney American, [place]   Assessment & Plan: Visit Diagnoses:  1. Chronic left shoulder pain     Plan: One month status post left shoulder arthroscopic SCD, DCR and anterior labral repair. Started physical therapy last week and is doing relatively well. Still having some aches and pains but feels like he is better than he was before surgery. Continue physical therapy office 3 weeks. discussed limitations regarding the labral repair  Follow-Up Instructions: Return in about 3 weeks (around 08/26/2017).   Orders:  No orders of the defined types were placed in this encounter.  Meds ordered this encounter  Medications  . methocarbamol (ROBAXIN) 500 MG tablet    Sig: Take 1 PO QHS PRN    Dispense:  20 tablet    Refill:  0      Procedures: No procedures performed   Clinical Data: No additional findings.   Subjective: Chief Complaint  Patient presents with  . Left Shoulder - Follow-up  . Shoulder Pain    Left shoulder pain, no injury, 07/05/17 arthroscopic surgery, limited range of motion, PT, Advil helps some, difficulty sleeping at night  1 month status post left shoulder surgery including NSAID, DCR and anterior labral repair. Started physical therapy last week. Having some aches and pains and using Advil and heat. No numbness or tingling. Does feel the pain is different than it was preop  HPI  Review of Systems  Constitutional: Positive for activity change.  HENT: Negative for trouble swallowing.   Eyes: Negative for pain.  Respiratory: Negative for shortness of breath.   Cardiovascular: Negative for leg swelling.  Gastrointestinal: Negative for constipation.  Endocrine: Negative for cold intolerance.  Genitourinary: Negative for  difficulty urinating.  Musculoskeletal: Positive for neck pain and neck stiffness.  Skin: Negative for rash.  Allergic/Immunologic: Negative for food allergies.  Neurological: Positive for weakness.  Hematological: Does not bruise/bleed easily.  Psychiatric/Behavioral: Positive for sleep disturbance.     Objective: Vital Signs: BP (!) 140/94 (BP Location: Right Arm, Patient Position: Sitting, Cuff Size: Normal)   Pulse 87   Resp 14   Ht 5\' 8"  (1.727 m)   Wt 200 lb (90.7 kg)   BMI 30.41 kg/m   Physical Exam  Ortho Exam awake alert and oriented 3. Comfortable sitting. Not wearing a sling. Able to abduct his left arm 90 flexed 30. Arthroscopic portals are healed without problem. Skin intact. Good grip and release. No neck pain.  Specialty Comments:  No specialty comments available.  Imaging: No results found.   PMFS History: Patient Active Problem List   Diagnosis Date Noted  . Impingement syndrome of left shoulder 07/05/2017  . 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  . Umbilical hernia without obstruction and without gangrene   . Bilateral inguinal hernia without obstruction or gangrene   . History of tobacco abuse 11/26/2016  . Hypertension   . Hyperlipidemia   . Migraines   . Rotator cuff tendinitis, left 11/12/2015  . Labral tear of shoulder, left, initial encounter 11/12/2015   Past Medical History:  Diagnosis Date  . Bone spur    LEFT SHOULDER  THAT MAKES HIS LEFT ARM TINGLE  . GERD (gastroesophageal reflux disease)   . Hyperlipidemia   . Hypertension   . Migraines    rare migraines    Family History  Problem Relation Age of Onset  . Diabetes Mother   . Migraines Mother   . Cancer Neg Hx   . COPD Neg Hx   . Heart disease Neg Hx   . Stroke Neg Hx     Past Surgical History:  Procedure Laterality Date  . INGUINAL HERNIA REPAIR Bilateral 01/21/2017   Procedure: LAPAROSCOPIC BILATERAL INGUINAL HERNIA REPAIR;  Surgeon: Jules Husbands, MD;  Location: [facility];  Service: General;  Laterality: Bilateral;  . KNEE SURGERY Left   . SHOULDER ARTHROSCOPY WITH DISTAL CLAVICLE RESECTION Left 07/05/2017   Procedure: LEFT SHOULDER ARTHROSCOPY, SUBACROMIAL DECOMPRESSION, DISTAL CLAVICLE RESECTION, POSSIBLE MINI OPEN ROTATOR CUFF REPAIR;  Surgeon: [name], MD;  Location: [facility];  Service: Orthopedics;  Laterality: Left;  . SHOULDER SURGERY Right   . UMBILICAL HERNIA REPAIR N/A 01/21/2017   Procedure: HERNIA REPAIR UMBILICAL ADULT;  Surgeon: Jules Husbands, MD;  Location: [facility];  Service: General;  Laterality: N/A;   Social History   Occupational History  . Not on file  Tobacco Use  . Smoking status: Former Smoker    Packs/day: 0.50    Years: 26.00    Pack years: 13.00    Types: Cigarettes    Last attempt to quit: 01/13/2017    Years since quitting: 0.5  . Smokeless tobacco: Never Used  . Tobacco comment: PT STARTED TAKING ZYBAN ON 01-08-17 IN HOPES OF QUITTING  Substance and Sexual Activity  . Alcohol use: No  . Drug use: No  . Sexual activity: Not on file

## 2017-08-08 ENCOUNTER — Ambulatory Visit: Payer: BLUE CROSS/BLUE SHIELD | Admitting: Physical Therapy

## 2017-08-08 ENCOUNTER — Encounter: Payer: Self-pay | Admitting: Physical Therapy

## 2017-08-08 DIAGNOSIS — M6281 Muscle weakness (generalized): Secondary | ICD-10-CM

## 2017-08-08 DIAGNOSIS — M25512 Pain in left shoulder: Secondary | ICD-10-CM | POA: Diagnosis not present

## 2017-08-08 DIAGNOSIS — M25612 Stiffness of left shoulder, not elsewhere classified: Secondary | ICD-10-CM

## 2017-08-08 DIAGNOSIS — R600 Localized edema: Secondary | ICD-10-CM

## 2017-08-08 NOTE — Therapy (Signed)
[place], [name], [address] Phone: [phone]   Fax:  [phone]  Physical Therapy Treatment  Patient Details  Name: Keith Barber MRN: [id number] Date of Birth: 1971/04/25 Referring Provider: Joni Fears, MD    Encounter Date: 08/08/2017  PT End of Session - 08/08/17 1250    Visit Number  3    Number of Visits  16    Date for PT Re-Evaluation  09/20/17    PT Start Time  4259    PT Stop Time  1245    PT Time Calculation (min)  71 min    Activity Tolerance  Patient tolerated treatment well    Behavior During Therapy  [facility] for tasks assessed/performed       Past Medical History:  Diagnosis Date  . Bone spur    LEFT SHOULDER THAT MAKES HIS LEFT ARM TINGLE  . GERD (gastroesophageal reflux disease)   . Hyperlipidemia   . Hypertension   . Migraines    rare migraines    Past Surgical History:  Procedure Laterality Date  . INGUINAL HERNIA REPAIR Bilateral 01/21/2017   Procedure: LAPAROSCOPIC BILATERAL INGUINAL HERNIA REPAIR;  Surgeon: Jules Husbands, MD;  Location: [facility];  Service: General;  Laterality: Bilateral;  . KNEE SURGERY Left   . SHOULDER ARTHROSCOPY WITH DISTAL CLAVICLE RESECTION Left 07/05/2017   Procedure: LEFT SHOULDER ARTHROSCOPY, SUBACROMIAL DECOMPRESSION, DISTAL CLAVICLE RESECTION, POSSIBLE MINI OPEN ROTATOR CUFF REPAIR;  Surgeon: [name], MD;  Location: [place];  Service: Orthopedics;  Laterality: Left;  . SHOULDER SURGERY Right   . UMBILICAL HERNIA REPAIR N/A 01/21/2017   Procedure: HERNIA REPAIR UMBILICAL ADULT;  Surgeon: Jules Husbands, MD;  Location: [facility];  Service: General;  Laterality: N/A;    There were no vitals filed for this visit.  Subjective Assessment - 08/08/17 1150    Subjective     Started muscle rexaxers.   to help with the pain.  See s MD.' in 3 weeks.      Currently in Pain?  Yes    Pain Score  5     Pain Location  Shoulder    Pain Orientation   Left    Pain Descriptors / Indicators  Dull    Aggravating Factors   turning arm     Pain Relieving Factors  pillows,  muscle rexaxers    Effect of Pain on Daily Activities  not sleeping on shoulder                        [facility] Adult PT Treatment/Exercise - 08/08/17 0001      Shoulder Exercises: Supine   External Rotation  AAROM    External Rotation Limitations  gentle only  Patient guarding      Shoulder Exercises: Seated   Retraction  AROM;10 reps    Flexion  AAROM UE Ranger,  cued to avoid pain shooting down the arm    Abduction  AAROM UE ranger    Other Seated Exercises  rolls 10 x each way.      Shoulder Exercises: Pulleys   Flexion  3 minutes    ABduction  2 minutes      Vasopneumatic   Number Minutes Vasopneumatic   15 minutes    Vasopnuematic Location   Shoulder    Vasopneumatic Pressure  Medium    Vasopneumatic Temperature   32      Manual Therapy   Manual Therapy  Passive ROM    Manual therapy comments  Gentle soft tissue work,  levator softened,  edema pooling superior clavicle     Passive ROM  L UE all planes, pain with return to neutral, abduction especially  patient guarded,  gentle             PT Education - 08/08/17 1249    Education provided  Yes    Education Details  precautions    Person(s) Educated  Patient    Methods  Explanation    Comprehension  Verbalized understanding       PT Short Term Goals - 08/08/17 1255      PT SHORT TERM GOAL #1   Title  Patient will be I with initial HEP for Lt UE ROM and strength     Baseline  independent with exercises issued so far    Time  4    Period  Weeks    Status  On-going      PT SHORT TERM GOAL #2   Title  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  no end feel with flexion,  Pain limits    Time  4    Period  Weeks    Status  On-going      PT SHORT TERM GOAL #4   Title  Pt will demo less pain overall (25%) with normal mobility tasks with assist of Rt. UE      Time  4    Status  Unable to assess        PT Long Term Goals - 07/27/17 1348      PT LONG TERM GOAL #1   Title  Pt will be I with more advanced HEP for L UE     Time  8    Period  Weeks    Status  New    Target Date  09/20/17      PT LONG TERM GOAL #2   Title  Pt will score FOTO less than 40% to demo improved functional mobilty.     Time  8    Period  Weeks    Status  New    Target Date  09/20/17      PT LONG TERM GOAL #3   Title  Pt will be able to raise arm >130 deg for improved overhead reaching anf household tasks.     Time  8    Period  Weeks    Status  New    Target Date  09/20/17      PT LONG TERM GOAL #4   Title  Pt will demo strength in L UE in 4+/5 throughout for safety with lifting, maximizing functional use in and out of the home.     Time  8    Period  Weeks    Status  New    Target Date  09/20/17      PT LONG TERM GOAL #5   Title  Pt will report no difficulty with grooming and ADLs,  to include reaching to lower back and behind head.     Time  8    Period  Weeks    Status  New    Target Date  09/20/17            Plan - 08/08/17 1251    Clinical Impression Statement  Patient again needs cues to go gently with ROM.    ROM greatly improved,  AAROM on pulleys and UE ranger.  It was hard to get him to relax for PROM.  2/10 pain at end of  session.  He opted for Vasopneumatic .Marland Kitchen  Patient able  to reach behind  easiier after vaso.  progress toward rom goals.    PT Treatment/Interventions  ADLs/Self Care Home Management;Cryotherapy;Ultrasound;Moist Heat;Electrical Stimulation;Therapeutic exercise;Therapeutic activities;Functional mobility training;Neuromuscular re-education;Patient/family  education;Passive range of motion;Vasopneumatic Device;Taping    PT Next Visit Plan  progress AAROM as tolerated,  UE ranger, modaities as needed    PT Home Exercise Plan  AAROM table slides and pendulums    Consulted and Agree with Plan of Care  Patient       Patient will benefit from skilled therapeutic intervention in order to improve the following deficits and impairments:     Visit Diagnosis: Acute pain of left shoulder  Localized edema  Stiffness of left shoulder, not elsewhere classified  Muscle weakness (generalized)     Problem List Patient Active Problem List   Diagnosis Date Noted  . Impingement syndrome of left shoulder 07/05/2017  . 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  . Umbilical hernia without obstruction and without gangrene   . Bilateral inguinal hernia without obstruction or gangrene   . History of tobacco abuse 11/26/2016  . Hypertension   . Hyperlipidemia   . Migraines   . Rotator cuff tendinitis, left 11/12/2015  . Labral tear of shoulder, left, initial encounter 11/12/2015    [facility] 08/08/2017, 12:58 PM  [facility] [address] [place], [name], [address] Phone: [phone]   Fax:  [phone]  Name: Keith Barber MRN: [id number] Date of Birth: [birth year]

## 2017-08-10 ENCOUNTER — Encounter: Payer: BLUE CROSS/BLUE SHIELD | Admitting: Physical Therapy

## 2017-08-11 ENCOUNTER — Encounter: Payer: Self-pay | Admitting: Physical Therapy

## 2017-08-11 ENCOUNTER — Ambulatory Visit: Payer: BLUE CROSS/BLUE SHIELD | Admitting: Physical Therapy

## 2017-08-11 DIAGNOSIS — R600 Localized edema: Secondary | ICD-10-CM

## 2017-08-11 DIAGNOSIS — M25612 Stiffness of left shoulder, not elsewhere classified: Secondary | ICD-10-CM

## 2017-08-11 DIAGNOSIS — M25512 Pain in left shoulder: Secondary | ICD-10-CM | POA: Diagnosis not present

## 2017-08-11 DIAGNOSIS — M6281 Muscle weakness (generalized): Secondary | ICD-10-CM

## 2017-08-11 NOTE — Therapy (Signed)
[place], [name], [address] Phone: [phone]   Fax:  [phone]  Physical Therapy Treatment  Patient Details  Name: Keith Barber MRN: [id number] Date of Birth: [birth year] Referring Provider: Joni Fears, MD    Encounter Date: 08/11/2017  PT End of Session - 08/11/17 1314    Visit Number  4    Number of Visits  16    Date for PT Re-Evaluation  09/20/17    PT Start Time  6962    PT Stop Time  1245    PT Time Calculation (min)  57 min    Behavior During Therapy  [facility] for tasks assessed/performed       Past Medical History:  Diagnosis Date  . Bone spur    LEFT SHOULDER THAT MAKES HIS LEFT ARM TINGLE  . GERD (gastroesophageal reflux disease)   . Hyperlipidemia   . Hypertension   . Migraines    rare migraines    Past Surgical History:  Procedure Laterality Date  . INGUINAL HERNIA REPAIR Bilateral 01/21/2017   Procedure: LAPAROSCOPIC BILATERAL INGUINAL HERNIA REPAIR;  Surgeon: Jules Husbands, MD;  Location: [facility];  Service: General;  Laterality: Bilateral;  . KNEE SURGERY Left   . SHOULDER ARTHROSCOPY WITH DISTAL CLAVICLE RESECTION Left 07/05/2017   Procedure: LEFT SHOULDER ARTHROSCOPY, SUBACROMIAL DECOMPRESSION, DISTAL CLAVICLE RESECTION, POSSIBLE MINI OPEN ROTATOR CUFF REPAIR;  Surgeon: [name], MD;  Location: [place];  Service: Orthopedics;  Laterality: Left;  . SHOULDER SURGERY Right   . UMBILICAL HERNIA REPAIR N/A 01/21/2017   Procedure: HERNIA REPAIR UMBILICAL ADULT;  Surgeon: Jules Husbands, MD;  Location: [facility];  Service: General;  Laterality: N/A;    There were no vitals filed for this visit.  Subjective Assessment - 08/11/17 1306    Subjective  2-3/10 sore unless I move it the wrong way.    Currently in Pain?  Yes    Pain Score  3     Pain Location  Shoulder    Pain Orientation  Left    Pain Descriptors / Indicators  Dull    Pain Type  Surgical pain    Pain  Radiating Towards  lateral elbow    Pain Frequency  Intermittent    Aggravating Factors   truning arm/  body  quick moves    Pain Relieving Factors  pillows,  cold    Effect of Pain on Daily Activities  not sleeping on shoulder,  limited by protocol         [facility] PT Assessment - 08/11/17 0001      PROM   Left Shoulder Flexion  143 Degrees    Left Shoulder ABduction  90 Degrees    Left Shoulder External Rotation  34 Degrees at 30 degrees abduction                  [facility] Adult PT Treatment/Exercise - 08/11/17 0001      Vasopneumatic   Number Minutes Vasopneumatic   15 minutes    Vasopnuematic Location   Shoulder    Vasopneumatic Pressure  Medium    Vasopneumatic Temperature   32      Manual Therapy   Manual Therapy  Passive ROM    Manual therapy comments  Gentle soft tissue work,  levator softened,  edema pooling superior clavicle is getting smaller    Passive ROM  L UE all planes, pain with return to neutral, abduction especially  patient guarded,  gentle             PT Education - 08/11/17 1313    Education provided  Yes    Education Details  precautions    Person(s) Educated  Patient    Methods  Explanation    Comprehension  Verbalized understanding       PT Short Term Goals - 08/08/17 1255      PT SHORT TERM GOAL #1   Title  Patient will be I with initial HEP for Lt UE ROM and strength     Baseline  independent with exercises issued so far    Time  4    Period  Weeks    Status  On-going      PT SHORT TERM GOAL #2   Title  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  no end feel with flexion,  Pain limits    Time  4    Period  Weeks    Status  On-going      PT  SHORT TERM GOAL #4   Title  Pt will demo less pain overall (25%) with normal mobility tasks with assist of Rt. UE      Time  4    Status  Unable to assess        PT Long Term Goals - 07/27/17 1348      PT LONG TERM GOAL #1   Title  Pt will be I with more advanced HEP for L UE     Time  8    Period  Weeks    Status  New    Target Date  09/20/17      PT LONG TERM GOAL #2   Title  Pt will score FOTO less than 40% to demo improved functional mobilty.     Time  8    Period  Weeks    Status  New    Target Date  09/20/17      PT LONG TERM GOAL #3   Title  Pt will be able to raise arm >130 deg for improved overhead reaching anf household tasks.     Time  8    Period  Weeks    Status  New    Target Date  09/20/17      PT LONG TERM GOAL #4   Title  Pt will demo strength in L UE in 4+/5 throughout for safety with lifting, maximizing functional use in and out of the home.     Time  8    Period  Weeks    Status  New    Target Date  09/20/17      PT LONG TERM GOAL #5   Title  Pt will report no difficulty with grooming and ADLs, to include reaching to lower back and behind head.     Time  8    Period  Weeks    Status  New    Target Date  09/20/17  Plan - 08/11/17 1320    Clinical Impression Statement  AAROM continues to improve.  142 Flexion. See flow sheet.  Manual focus today for ROM.  Pain levels are decreasing.      PT Next Visit Plan  progress AAROM as tolerated,  UE ranger, modaities as needed Pulleys,, cane?    PT Home Exercise Plan  AAROM table slides and pendulums    Consulted and Agree with Plan of Care  Patient       Patient will benefit from skilled therapeutic intervention in order to improve the following deficits and impairments:     Visit Diagnosis: Acute pain of left shoulder  Localized edema  Stiffness of left shoulder, not elsewhere classified  Muscle weakness (generalized)     Problem List Patient Active Problem List    Diagnosis Date Noted  . Impingement syndrome of left shoulder 07/05/2017  . 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  . Umbilical hernia without obstruction and without gangrene   . Bilateral inguinal hernia without obstruction or gangrene   . History of tobacco abuse 11/26/2016  . Hypertension   . Hyperlipidemia   . Migraines   . Rotator cuff tendinitis, left 11/12/2015  . Labral tear of shoulder, left, initial encounter 11/12/2015    [facility]  PTA 08/11/2017, 1:22 PM  [facility] [address] [place], [name], [address] Phone: [phone]   Fax:  [phone]  Name: Jaydee Conran MRN: [id number] Date of Birth: July 18, 1971

## 2017-08-15 ENCOUNTER — Encounter: Payer: Self-pay | Admitting: Physical Therapy

## 2017-08-15 ENCOUNTER — Ambulatory Visit: Payer: BLUE CROSS/BLUE SHIELD | Admitting: Physical Therapy

## 2017-08-15 DIAGNOSIS — M25512 Pain in left shoulder: Secondary | ICD-10-CM | POA: Diagnosis not present

## 2017-08-15 DIAGNOSIS — M6281 Muscle weakness (generalized): Secondary | ICD-10-CM

## 2017-08-15 DIAGNOSIS — M25612 Stiffness of left shoulder, not elsewhere classified: Secondary | ICD-10-CM

## 2017-08-15 DIAGNOSIS — R600 Localized edema: Secondary | ICD-10-CM

## 2017-08-15 NOTE — Patient Instructions (Signed)
Call MD if pain is not better By Wednesday Per PT Cheryln Manly.

## 2017-08-15 NOTE — Therapy (Signed)
[place], [name], [address] Phone: [phone]   Fax:  [phone]  Physical Therapy Treatment  Patient Details  Name: Keith Barber MRN: [id number] Date of Birth: [birth year] Referring Provider: Joni Fears, MD    Encounter Date: 08/15/2017  PT End of Session - 08/15/17 1836    Visit Number  5    Number of Visits  16    Date for PT Re-Evaluation  09/20/17    PT Start Time  1332    PT Stop Time  1425    PT Time Calculation (min)  53 min    Activity Tolerance  Patient limited by pain    Behavior During Therapy  [facility] for tasks assessed/performed       Past Medical History:  Diagnosis Date  . Bone spur    LEFT SHOULDER THAT MAKES HIS LEFT ARM TINGLE  . GERD (gastroesophageal reflux disease)   . Hyperlipidemia   . Hypertension   . Migraines    rare migraines    Past Surgical History:  Procedure Laterality Date  . INGUINAL HERNIA REPAIR Bilateral 01/21/2017   Procedure: LAPAROSCOPIC BILATERAL INGUINAL HERNIA REPAIR;  Surgeon: Jules Husbands, MD;  Location: [facility];  Service: General;  Laterality: Bilateral;  . KNEE SURGERY Left   . SHOULDER ARTHROSCOPY WITH DISTAL CLAVICLE RESECTION Left 07/05/2017   Procedure: LEFT SHOULDER ARTHROSCOPY, SUBACROMIAL DECOMPRESSION, DISTAL CLAVICLE RESECTION, POSSIBLE MINI OPEN ROTATOR CUFF REPAIR;  Surgeon: [name], MD;  Location: [facility];  Service: Orthopedics;  Laterality: Left;  . SHOULDER SURGERY Right   . UMBILICAL HERNIA REPAIR N/A 01/21/2017   Procedure: HERNIA REPAIR UMBILICAL ADULT;  Surgeon: Jules Husbands, MD;  Location: [facility];  Service: General;  Laterality: N/A;    There were no vitals filed for this visit.  Subjective Assessment - 08/15/17 1343    Subjective  hardly any pain at rest.  If I forget and move 5-6/10.  I picked up a laundty basket with both hands then quickly shifted onto my right hip.  It has been popping 2-3 X a day  when i  shift my weight and turn my trunk for example.      Currently in Pain?  Yes    Pain Score  6  1/10 pain at rest    Pain Location  Shoulder    Pain Orientation  Left    Pain Descriptors / Indicators  -- stiff,  sharp,  pops like a knuckle and is painful    Pain Type  Surgical pain    Pain Radiating Towards  biceps,  triceps areas    Aggravating Factors   turning arm,  quick moves without thinking    Pain Relieving Factors  heating pad,  rest    Effect of Pain on Daily Activities  limited exercises.           [facility] PT Assessment - 08/15/17 0001      PROM   Left Shoulder Flexion  85 Degrees                  [facility] Adult PT Treatment/Exercise - 08/15/17 0001      Shoulder Exercises: Prone   Extension Limitations  leaning prone over table scapular retraction,  shoulder extension to hip 6 X stopped due to pain.      Shoulder Exercises: Pulleys   Flexion  2 minutes painful .  limited about 85      Shoulder Exercises: ROM/Strengthening  UBE (Upper Arm Bike)  3 minutes forward,  1 minute reverse.        Shoulder Exercises: Isometric Strengthening   External Rotation  5X5" cued technique    Internal Rotation  5X5" Cued technique      Moist Heat Therapy   Number Minutes Moist Heat  15 Minutes    Moist Heat Location  Shoulder concurrent with IFC      Electrical Stimulation   Electrical Stimulation Location  shoulder    Electrical Stimulation Action  IFC    Electrical Stimulation Parameters  12    Electrical Stimulation Goals  Pain      Manual Therapy   Manual Therapy  Passive ROM    Manual therapy comments  Gentle soft tissue work  Patient guarding    Passive ROM  left limited by pain             PT Education - 08/15/17 1831    Education provided  Yes    Education Details  Pain control    Person(s) Educated  Patient    Methods  Explanation    Comprehension  Verbalized understanding       PT Short Term Goals - 08/15/17 1837      PT SHORT TERM GOAL  #1   Title  Patient will be I with initial HEP for Lt UE ROM and strength     Baseline  pain limits ex today    Time  4    Period  Weeks    Status  On-going      PT SHORT TERM GOAL #2   Title  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 UE      Baseline  Pain flARE    Time  4    Period  Weeks    Status  On-going        PT Long Term Goals - 07/27/17 1348      PT LONG TERM GOAL #1   Title  Pt will be I with more advanced HEP for L UE     Time  8    Period  Weeks    Status  New    Target Date  09/20/17      PT LONG TERM GOAL #2   Title  Pt will score FOTO less than 40% to demo improved functional mobilty.     Time  8    Period  Weeks    Status  New    Target Date  09/20/17      PT LONG TERM GOAL #3   Title  Pt will be able to raise arm >130 deg for improved overhead reaching anf household tasks.     Time  8    Period  Weeks    Status  New    Target Date  09/20/17  PT LONG TERM GOAL #4   Title  Pt will demo strength in L UE in 4+/5 throughout for safety with lifting, maximizing functional use in and out of the home.     Time  8    Period  Weeks    Status  New    Target Date  09/20/17      PT LONG TERM GOAL #5   Title  Pt will report no difficulty with grooming and ADLs, to include reaching to lower back and behind head.     Time  8    Period  Weeks    Status  New    Target Date  09/20/17            Plan - 08/15/17 1832    Clinical Impression Statement  AAROM decreased to about 85 today, limited by pain.  He has noted increased popping with pain.  He did  lift a laundry basket out of the car with 2 hands without thinking felt pain then carried it with right arm.  Cheryln Manly PT suggested mild exercise,  Modalities to help with the pain and asked him to call MD if pain does not decrease.      PT Next Visit Plan  assess pain.  AA as able    PT Home Exercise Plan  AAROM table slides and pendulums    Consulted and Agree with Plan of Care  Patient       Patient will benefit from skilled therapeutic intervention in order to improve the following deficits and impairments:     Visit Diagnosis: Acute pain of left shoulder  Localized edema  Stiffness of left shoulder, not elsewhere classified  Muscle weakness (generalized)     Problem List Patient Active Problem List   Diagnosis Date Noted  . Impingement syndrome of left shoulder 07/05/2017  . 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  . Umbilical hernia without obstruction and without gangrene   . Bilateral inguinal hernia without obstruction or gangrene   . History of tobacco abuse 11/26/2016  . Hypertension   . Hyperlipidemia   . Migraines   . Rotator cuff tendinitis, left 11/12/2015  . Labral tear of shoulder, left, initial encounter 11/12/2015    [facility] pta 08/15/2017, 6:39 PM  [place], [name], [address] Phone: [phone]   Fax:  [phone]  Name: Keith Barber MRN: [id number] Date of Birth: [birth year]

## 2017-08-16 ENCOUNTER — Encounter: Payer: BLUE CROSS/BLUE SHIELD | Admitting: Physical Therapy

## 2017-08-17 ENCOUNTER — Encounter: Payer: BLUE CROSS/BLUE SHIELD | Admitting: Physical Therapy

## 2017-08-18 ENCOUNTER — Encounter: Payer: Self-pay | Admitting: Physical Therapy

## 2017-08-18 ENCOUNTER — Ambulatory Visit: Payer: BLUE CROSS/BLUE SHIELD | Admitting: Physical Therapy

## 2017-08-18 DIAGNOSIS — M6281 Muscle weakness (generalized): Secondary | ICD-10-CM

## 2017-08-18 DIAGNOSIS — M25512 Pain in left shoulder: Secondary | ICD-10-CM | POA: Diagnosis not present

## 2017-08-18 DIAGNOSIS — R600 Localized edema: Secondary | ICD-10-CM

## 2017-08-18 DIAGNOSIS — M25612 Stiffness of left shoulder, not elsewhere classified: Secondary | ICD-10-CM

## 2017-08-18 NOTE — Therapy (Signed)
[place], [name], [address] Phone: [phone]   Fax:  [phone]  Physical Therapy Treatment  Patient Details  Name: Keith Barber MRN: [id number] Date of Birth: [birth year] Referring Provider: Joni Fears, MD    Encounter Date: 08/18/2017  PT End of Session - 08/18/17 1628    Visit Number  6    Number of Visits  16    Date for PT Re-Evaluation  09/20/17    PT Start Time  2355    PT Stop Time  7322 Noted in EPIC he had been checked out prior to end of his session,  Error?    PT Time Calculation (min)  54 min    Activity Tolerance  Patient tolerated treatment well    Behavior During Therapy  WFL for tasks assessed/performed       Past Medical History:  Diagnosis Date  . Bone spur    LEFT SHOULDER THAT MAKES HIS LEFT ARM TINGLE  . GERD (gastroesophageal reflux disease)   . Hyperlipidemia   . Hypertension   . Migraines    rare migraines    Past Surgical History:  Procedure Laterality Date  . INGUINAL HERNIA REPAIR Bilateral 01/21/2017   Procedure: LAPAROSCOPIC BILATERAL INGUINAL HERNIA REPAIR;  Surgeon: Jules Husbands, MD;  Location: [facility];  Service: General;  Laterality: Bilateral;  . KNEE SURGERY Left   . SHOULDER ARTHROSCOPY WITH DISTAL CLAVICLE RESECTION Left 07/05/2017   Procedure: LEFT SHOULDER ARTHROSCOPY, SUBACROMIAL DECOMPRESSION, DISTAL CLAVICLE RESECTION, POSSIBLE MINI OPEN ROTATOR CUFF REPAIR;  Surgeon: [name], MD;  Location: [place];  Service: Orthopedics;  Laterality: Left;  . SHOULDER SURGERY Right   . UMBILICAL HERNIA REPAIR N/A 01/21/2017   Procedure: HERNIA REPAIR UMBILICAL ADULT;  Surgeon: Jules Husbands, MD;  Location: [facility];  Service: General;  Laterality: N/A;    There were no vitals filed for this visit.  Subjective Assessment - 08/18/17 1419    Subjective  IFC helped some.  I was swollen in my  lateral PECS  and I took it easy the same day.  Shoulder  felt better  1 day after the last session.  Sleeping improving with musclerelaxers    Currently in Pain?  Yes    Pain Score  2     Pain Location  Shoulder    Pain Orientation  Left;Anterior;Posterior    Pain Descriptors / Indicators  Sore;Sharp swollen    Pain Radiating Towards  biceps and upper trap    Pain Frequency  Intermittent    Aggravating Factors   truning,  quick movements without thinking    Pain Relieving Factors  muscle relaxers  (2 weeks) help with the sleeping. ,  heating pad,  rest    Effect of Pain on Daily Activities  limited by protocol         [facility] PT Assessment - 08/18/17 0001      PROM   Left Shoulder Flexion  132 Degrees    Left Shoulder ABduction  90 Degrees                  [facility] Adult PT Treatment/Exercise - 08/18/17 0001      Shoulder Exercises: Supine   External Rotation Limitations  AAROM,  gentle    Other Supine Exercises  cane  supine chest press 8 X       Shoulder Exercises: Seated   Other Seated Exercises  5 LBS hand grip strengthener,  rolled towel  whole hand squeeze  pinches with 5 second holds.      Shoulder Exercises: Pulleys   Flexion  -- 3 reps, painful to biceps so stopped.      Shoulder Exercises: ROM/Strengthening   UBE (Upper Arm Bike)  4 minutes forward,  1 minute reverse.  resting arm ve hoilding onto handle      Vasopneumatic   Number Minutes Vasopneumatic   15 minutes    Vasopnuematic Location   Shoulder    Vasopneumatic Pressure  Medium    Vasopneumatic Temperature   32      Manual Therapy   Manual Therapy  Passive ROM    Manual therapy comments  Gentle soft tissue work  Patient guarding    Passive ROM  132 flezion 90 abduction,  ER 10,  less guarding and radiating pain today.                 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 UE      Baseline  Pain flARE    Time  4    Period  Weeks    Status  On-going        PT Long Term Goals - 07/27/17 1348      PT LONG TERM GOAL #1   Title  Pt will be I with more advanced HEP for L UE     Time  8    Period  Weeks    Status  New    Target Date  09/20/17      PT LONG TERM GOAL #2   Title  Pt will score FOTO less than 40% to demo improved functional mobilty.     Time  8    Period  Weeks    Status  New    Target Date  09/20/17      PT LONG TERM GOAL #3   Title  Pt will be able to raise arm >130 deg for improved overhead reaching anf household tasks.     Time  8    Period  Weeks    Status  New    Target Date  09/20/17      PT LONG TERM GOAL #4   Title  Pt will demo strength in L UE in 4+/5 throughout for safety with lifting, maximizing functional use in and out of the home.     Time  8    Period  Weeks    Status  New    Target Date  09/20/17      PT LONG TERM GOAL #5   Title  Pt will report no difficulty with grooming and ADLs, to include reaching to lower back and behind head.     Time  8    Period  Weeks    Status  New    Target Date  09/20/17            Plan - 08/18/17 1642    Clinical Impression Statement  AAROM improving see flow sheet.  Pain 2/10 at end of session at rest.  Popping happens less frequently.  No new HEP today.       Patient will benefit from skilled therapeutic intervention in order to improve the following deficits and  impairments:     Visit Diagnosis: Acute pain of left shoulder  Localized edema  Stiffness of left shoulder, not elsewhere classified  Muscle weakness (generalized)     Problem List Patient Active Problem List   Diagnosis Date Noted  . Impingement syndrome of left shoulder 07/05/2017  . 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  . Umbilical hernia without obstruction and without gangrene   . Bilateral inguinal hernia without obstruction or gangrene   . History of tobacco abuse 11/26/2016  . Hypertension   . Hyperlipidemia   . Migraines   . Rotator cuff tendinitis, left 11/12/2015  . Labral tear of shoulder, left, initial encounter 11/12/2015    [facility] PTA 08/18/2017, 5:37 PM  [place], [name], [address] Phone: [phone]   Fax:  [phone]  Name: Yobany Vroom MRN: [id number] Date of Birth: 1971/02/08

## 2017-08-22 ENCOUNTER — Ambulatory Visit: Payer: BLUE CROSS/BLUE SHIELD | Attending: Orthopaedic Surgery | Admitting: Physical Therapy

## 2017-08-22 ENCOUNTER — Encounter: Payer: Self-pay | Admitting: Physical Therapy

## 2017-08-22 DIAGNOSIS — M25512 Pain in left shoulder: Secondary | ICD-10-CM | POA: Insufficient documentation

## 2017-08-22 DIAGNOSIS — M6281 Muscle weakness (generalized): Secondary | ICD-10-CM | POA: Insufficient documentation

## 2017-08-22 DIAGNOSIS — M25612 Stiffness of left shoulder, not elsewhere classified: Secondary | ICD-10-CM

## 2017-08-22 DIAGNOSIS — R600 Localized edema: Secondary | ICD-10-CM

## 2017-08-22 NOTE — Therapy (Signed)
[place], [name], [address] Phone: [phone]   Fax:  [phone]  Physical Therapy Treatment  Patient Details  Name: Keith Barber MRN: [id number] Date of Birth: January 20, 1971 Referring Provider: Joni Fears, MD    Encounter Date: 08/22/2017  PT End of Session - 08/22/17 1302    Visit Number  7    Number of Visits  16    Date for PT Re-Evaluation  09/20/17    PT Start Time  1146    PT Stop Time  1242    PT Time Calculation (min)  56 min    Activity Tolerance  Patient tolerated treatment well    Behavior During Therapy  Dr [facility] for tasks assessed/performed       Past Medical History:  Diagnosis Date  . Bone spur    LEFT SHOULDER THAT MAKES HIS LEFT ARM TINGLE  . GERD (gastroesophageal reflux disease)   . Hyperlipidemia   . Hypertension   . Migraines    rare migraines    Past Surgical History:  Procedure Laterality Date  . INGUINAL HERNIA REPAIR Bilateral 01/21/2017   Procedure: LAPAROSCOPIC BILATERAL INGUINAL HERNIA REPAIR;  Surgeon: Jules Husbands, MD;  Location: [facility];  Service: General;  Laterality: Bilateral;  . KNEE SURGERY Left   . SHOULDER ARTHROSCOPY WITH DISTAL CLAVICLE RESECTION Left 07/05/2017   Procedure: LEFT SHOULDER ARTHROSCOPY, SUBACROMIAL DECOMPRESSION, DISTAL CLAVICLE RESECTION, POSSIBLE MINI OPEN ROTATOR CUFF REPAIR;  Surgeon: [name], MD;  Location: [place];  Service: Orthopedics;  Laterality: Left;  . SHOULDER SURGERY Right   . UMBILICAL HERNIA REPAIR N/A 01/21/2017   Procedure: HERNIA REPAIR UMBILICAL ADULT;  Surgeon: Jules Husbands, MD;  Location: [facility];  Service: General;  Laterality: N/A;    There were no vitals filed for this visit.  Subjective Assessment - 08/22/17 1152    Subjective  I used Ice and heat 3 times oveer the weekend.  No pain at rest.  if I over do it biceps area throbbs.   he is doing the exercises. Posterior shoulder pain improving.    Currently in Pain?  Yes    Pain Score  2     Pain Location  Shoulder    Pain Orientation  Right;Anterior    Pain Descriptors / Indicators  Throbbing    Pain Radiating Towards  biceps and along collar bone    Pain Frequency  Intermittent    Aggravating Factors   using it too much    Pain Relieving Factors  tylenol,  alvve    Effect of Pain on Daily Activities  limited by protocol    Multiple Pain Sites  No                      [facility] Adult PT Treatment/Exercise - 08/22/17 0001      Shoulder Exercises: Supine   External Rotation Limitations  AAROM,  gentle cane 10 x    Flexion  -- cane,  not tolerated today    ABduction  -- not tolerated    Other Supine Exercises  cane  supine chest press 10 X       Shoulder Exercises: Seated   Other Seated Exercises  whole hand squeeze      Shoulder Exercises: Prone   Extension Limitations  Leaning over so arm hangs,  shoulder extension 10 X.  cued/  blocked to limit extension to neutral      Shoulder Exercises: ROM/Strengthening  UBE (Upper Arm Bike)  3 minutes forward,  2 minuted   reverse.        Shoulder Exercises: Isometric Strengthening   External Rotation  -- did not tolerate    Internal Rotation  -- did not tolerate      Vasopneumatic   Number Minutes Vasopneumatic   15 minutes    Vasopnuematic Location   Shoulder    Vasopneumatic Pressure  Medium    Vasopneumatic Temperature   32      Manual Therapy   Manual Therapy  Passive ROM    Manual therapy comments  Gentle soft tissue work  Patient guarding    Joint Mobilization  inferior glides grade 2-3  for  ROM    Passive ROM  141 flexion,  25 ER,  abd 90,  guarding               PT Short Term Goals - 08/22/17 1307      PT SHORT TERM GOAL #1   Title  Patient will be I with initial HEP for Lt UE ROM and strength     Baseline  Pain limits progression,  Independent with exercises so far    Time  4    Period  Weeks    Status  On-going      PT SHORT TERM  GOAL #2   Title  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 UE      Baseline  Mobility limited by protocol.  Painful.    Time  4    Period  Weeks    Status  On-going        PT Long Term Goals - 07/27/17 1348      PT LONG TERM GOAL #1   Title  Pt will be I with more advanced HEP for L UE     Time  8    Period  Weeks    Status  New    Target Date  09/20/17      PT LONG TERM GOAL #2   Title  Pt will score FOTO less than 40% to demo improved functional mobilty.     Time  8    Period  Weeks    Status  New    Target Date  09/20/17      PT LONG TERM GOAL #3   Title  Pt will be able to raise arm >130 deg for improved overhead reaching anf household tasks.     Time  8    Period  Weeks    Status  New    Target Date  09/20/17      PT LONG TERM GOAL #4   Title  Pt will demo strength in  L UE in 4+/5 throughout for safety with lifting, maximizing functional use in and out of the home.     Time  8    Period  Weeks    Status  New    Target Date  09/20/17      PT LONG TERM GOAL #5   Title  Pt will report no difficulty with grooming and ADLs, to include reaching to lower back and behind head.     Time  8    Period  Weeks    Status  New    Target Date  09/20/17            Plan - 08/22/17 1303    Clinical Impression Statement  141 AA flexion,  25 ER, Abduction 90 degrees.     PT Next Visit Plan  Progress AA as able.  Needs MD note.  Check biceps ( has been sore lately)    PT Home Exercise Plan  AAROM table slides  and pendulums,  ER cane    Consulted and Agree with Plan of Care  Patient       Patient will benefit from skilled therapeutic intervention in order to improve the following deficits and impairments:     Visit Diagnosis: Acute pain of left shoulder  Localized edema  Stiffness of left shoulder, not elsewhere classified  Muscle weakness (generalized)     Problem List Patient Active Problem List   Diagnosis Date Noted  . Impingement syndrome of left shoulder 07/05/2017  . 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  . Umbilical hernia without obstruction and without gangrene   . Bilateral inguinal hernia without obstruction or gangrene   . History of tobacco abuse 11/26/2016  . Hypertension   . Hyperlipidemia   . Migraines   . Rotator cuff tendinitis, left 11/12/2015  . Labral tear of shoulder, left, initial encounter 11/12/2015    Danita Proud PTA 08/22/2017, 1:10 PM  [facility] [address] [place], [name], [address] Phone: [phone]   Fax:  [phone]  Name: Keith Barber MRN: [id number] Date of Birth: 20-Aug-1970

## 2017-08-24 ENCOUNTER — Encounter: Payer: BLUE CROSS/BLUE SHIELD | Admitting: Physical Therapy

## 2017-08-25 ENCOUNTER — Encounter: Payer: Self-pay | Admitting: Physical Therapy

## 2017-08-25 ENCOUNTER — Ambulatory Visit: Payer: BLUE CROSS/BLUE SHIELD | Admitting: Physical Therapy

## 2017-08-25 ENCOUNTER — Ambulatory Visit (INDEPENDENT_AMBULATORY_CARE_PROVIDER_SITE_OTHER): Payer: BLUE CROSS/BLUE SHIELD | Admitting: Orthopaedic Surgery

## 2017-08-25 ENCOUNTER — Encounter (INDEPENDENT_AMBULATORY_CARE_PROVIDER_SITE_OTHER): Payer: Self-pay | Admitting: Orthopaedic Surgery

## 2017-08-25 VITALS — BP 141/95 | HR 97 | Resp 16 | Ht 68.0 in | Wt 200.0 lb

## 2017-08-25 DIAGNOSIS — R600 Localized edema: Secondary | ICD-10-CM

## 2017-08-25 DIAGNOSIS — G8929 Other chronic pain: Secondary | ICD-10-CM

## 2017-08-25 DIAGNOSIS — M25512 Pain in left shoulder: Secondary | ICD-10-CM

## 2017-08-25 DIAGNOSIS — M25612 Stiffness of left shoulder, not elsewhere classified: Secondary | ICD-10-CM

## 2017-08-25 DIAGNOSIS — M6281 Muscle weakness (generalized): Secondary | ICD-10-CM

## 2017-08-25 NOTE — Progress Notes (Signed)
Office Visit Note   Patient: Keith Barber           Date of Birth: 04-15-1971           MRN: [id number] Visit Date: 08/25/2017              Requested by: Volney American, PA-C [place], [place] [address] PCP: Volney American, PA-C   Assessment & Plan: Visit Diagnoses:  1. Chronic left shoulder pain     Plan: 6 weeks status post arthroscopic SCD DCR and repair of an anterior glenoid labral tear. He started physical therapy 2 weeks ago. Still having some discomfort and stiffness. With what appears to be an early adhesive capsulitis. He does have a list of exercises to perform at home. We'll see him back in 3 weeks  Follow-Up Instructions: Return in about 3 weeks (around 09/15/2017).   Orders:  No orders of the defined types were placed in this encounter.  No orders of the defined types were placed in this encounter.     Procedures: No procedures performed   Clinical Data: No additional findings.   Subjective: Chief Complaint  Patient presents with  . Left Shoulder - Routine Post Op  . Post-op Follow-up    Left shoulder pain, throbbing, swelling, PT, limited range of motion, Aleve doesn't help  Having some discomfort with overhead activity with limited range of motion consistent with early adhesive capsulitis. No numbness or tingling fever or chills  HPI  Review of Systems  Constitutional: Positive for activity change.  HENT: Negative for trouble swallowing.   Eyes: Negative for pain.  Respiratory: Negative for shortness of breath.   Cardiovascular: Negative for leg swelling.  Gastrointestinal: Negative for constipation.  Endocrine: Negative for cold intolerance.  Genitourinary: Negative for difficulty urinating.  Musculoskeletal: Positive for joint swelling.  Skin: Negative for rash.  Allergic/Immunologic: Negative for food allergies.  Neurological: Positive for weakness.  Hematological: Does not bruise/bleed easily.    Psychiatric/Behavioral: Negative for sleep disturbance.     Objective: Vital Signs: BP (!) 141/95 (BP Location: Right Arm, Patient Position: Sitting, Cuff Size: Normal)   Pulse 97   Resp 16   Ht 5\' 8"  (1.727 m)   Wt 200 lb (90.7 kg)   BMI 30.41 kg/m   Physical Exam  Ortho Exam awake alert and oriented 3. Comfortable sitting. Biceps intact. Neurovascular exam intact. I can abduct him 90. He has only about 15-20 of external rotation about 110-20 of overhead flexion at which point is very tight and somewhat uncomfortable. Some referred pain into the biceps with flexion of the shoulder which may represent some referred pain from the biceps tendon. The labral repair was close to the biceps anchor.  Specialty Comments:  No specialty comments available.  Imaging: No results found.   PMFS History: Patient Active Problem List   Diagnosis Date Noted  . Impingement syndrome of left shoulder 07/05/2017  . 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  . Umbilical hernia without obstruction and without gangrene   . Bilateral inguinal hernia without obstruction or gangrene   . History of tobacco abuse 11/26/2016  . Hypertension   . Hyperlipidemia   . Migraines   . Rotator cuff tendinitis, left 11/12/2015  . Labral tear of shoulder, left, initial encounter 11/12/2015   Past Medical History:  Diagnosis Date  . Bone spur    LEFT SHOULDER THAT MAKES HIS LEFT ARM TINGLE  . GERD (gastroesophageal reflux disease)   .  Hyperlipidemia   . Hypertension   . Migraines    rare migraines    Family History  Problem Relation Age of Onset  . Diabetes Mother   . Migraines Mother   . Cancer Neg Hx   . COPD Neg Hx   . Heart disease Neg Hx   . Stroke Neg Hx     Past Surgical History:  Procedure Laterality Date  . INGUINAL HERNIA REPAIR Bilateral 01/21/2017   Procedure: LAPAROSCOPIC BILATERAL INGUINAL HERNIA REPAIR;  Surgeon: Jules Husbands, MD;  Location: [facility];   Service: General;  Laterality: Bilateral;  . KNEE SURGERY Left   . SHOULDER ARTHROSCOPY WITH DISTAL CLAVICLE RESECTION Left 07/05/2017   Procedure: LEFT SHOULDER ARTHROSCOPY, SUBACROMIAL DECOMPRESSION, DISTAL CLAVICLE RESECTION, POSSIBLE MINI OPEN ROTATOR CUFF REPAIR;  Surgeon: [name], MD;  Location: [place];  Service: Orthopedics;  Laterality: Left;  . SHOULDER SURGERY Right   . UMBILICAL HERNIA REPAIR N/A 01/21/2017   Procedure: HERNIA REPAIR UMBILICAL ADULT;  Surgeon: Jules Husbands, MD;  Location: [facility];  Service: General;  Laterality: N/A;   Social History   Occupational History  . Not on file  Tobacco Use  . Smoking status: Former Smoker    Packs/day: 0.50    Years: 26.00    Pack years: 13.00    Types: Cigarettes    Last attempt to quit: 01/13/2017    Years since quitting: 0.6  . Smokeless tobacco: Never Used  . Tobacco comment: PT STARTED TAKING ZYBAN ON 01-08-17 IN HOPES OF QUITTING  Substance and Sexual Activity  . Alcohol use: No  . Drug use: No  . Sexual activity: Not on file

## 2017-08-25 NOTE — Patient Instructions (Signed)
Strengthening: Isometric Flexion  Using wall for resistance, press right fist into ball using light pressure. Hold ____ seconds. Repeat ____ times per set. Do ____ sets per session. Do ____ sessions per day.  SHOULDER: Abduction (Isometric)  Use wall as resistance. Press arm against pillow. Keep elbow straight. Hold ___ seconds. ___ reps per set, ___ sets per day, ___ days per week  Extension (Isometric)  Place left bent elbow and back of arm against wall. Press elbow against wall. Hold ____ seconds. Repeat ____ times. Do ____ sessions per day.  Internal Rotation (Isometric)  Place palm of right fist against door frame, with elbow bent. Press fist against door frame. Hold ____ seconds. Repeat ____ times. Do ____ sessions per day.  External Rotation (Isometric)  Place back of left fist against door frame, with elbow bent. Press fist against door frame. Hold ____ seconds. Repeat ____ times. Do ____ sessions per day.  Copyright  VHI. All rights reserved.    

## 2017-08-25 NOTE — Therapy (Signed)
[place], [name], [address] Phone: [phone]   Fax:  [phone]  Physical Therapy Treatment  Patient Details  Name: Keith Barber MRN: [id number] Date of Birth: Dec 10, 1970 Referring Provider: Joni Fears, MD    Encounter Date: 08/25/2017  PT End of Session - 08/25/17 1348    Visit Number  8    Number of Visits  16    Date for PT Re-Evaluation  09/20/17    PT Start Time  1330    PT Stop Time  1430    PT Time Calculation (min)  60 min    Activity Tolerance  Patient limited by pain    Behavior During Therapy  [facility] for tasks assessed/performed       Past Medical History:  Diagnosis Date  . Bone spur    LEFT SHOULDER THAT MAKES HIS LEFT ARM TINGLE  . GERD (gastroesophageal reflux disease)   . Hyperlipidemia   . Hypertension   . Migraines    rare migraines    Past Surgical History:  Procedure Laterality Date  . INGUINAL HERNIA REPAIR Bilateral 01/21/2017   Procedure: LAPAROSCOPIC BILATERAL INGUINAL HERNIA REPAIR;  Surgeon: Jules Husbands, MD;  Location: [facility];  Service: General;  Laterality: Bilateral;  . KNEE SURGERY Left   . SHOULDER ARTHROSCOPY WITH DISTAL CLAVICLE RESECTION Left 07/05/2017   Procedure: LEFT SHOULDER ARTHROSCOPY, SUBACROMIAL DECOMPRESSION, DISTAL CLAVICLE RESECTION, POSSIBLE MINI OPEN ROTATOR CUFF REPAIR;  Surgeon: [name], MD;  Location: [place];  Service: Orthopedics;  Laterality: Left;  . SHOULDER SURGERY Right   . UMBILICAL HERNIA REPAIR N/A 01/21/2017   Procedure: HERNIA REPAIR UMBILICAL ADULT;  Surgeon: Jules Husbands, MD;  Location: [facility];  Service: General;  Laterality: N/A;    There were no vitals filed for this visit.  Subjective Assessment - 08/25/17 1329    Subjective  Throbbing in arm /forearm, especially after I use it .      Currently in Pain?  Yes    Pain Score  1  severe with ROM today     Pain Location  Shoulder    Pain Orientation  Left     Pain Descriptors / Indicators  Throbbing    Pain Type  Surgical pain    Pain Radiating Towards  biceps, forearm     Pain Onset  1 to 4 weeks ago    Pain Frequency  Intermittent    Aggravating Factors   movement, especially eccentric     Pain Relieving Factors  meds, ice , sling     Multiple Pain Sites  No         [facility] PT Assessment - 08/25/17 0001      AROM   Left Shoulder Flexion  100 Degrees    Left Shoulder ABduction  90 Degrees    Left Shoulder External Rotation  15 Degrees      PROM   Left Shoulder Flexion  132 Degrees    Left Shoulder ABduction  90 Degrees          [facility] Adult PT Treatment/Exercise - 08/25/17 0001      Shoulder Exercises: Seated   Horizontal ABduction  AAROM;Left;15 reps    External Rotation  AAROM;Left;15 reps    Flexion  AAROM;Left;15 reps    Abduction  AAROM;Left;15 reps      Shoulder Exercises: Standing   Row  Strengthening;Both;12 reps    Row Weight (lbs)  gentle, uses min resistance  Retraction  Strengthening;Both;10 reps      Shoulder Exercises: ROM/Strengthening   Wall Wash  flexion x 10, cues relax neck     Pendulum  3 min used R.t UE to assist      Vasopneumatic   Number Minutes Vasopneumatic   15 minutes    Vasopnuematic Location   Shoulder    Vasopneumatic Pressure  Medium    Vasopneumatic Temperature   32      Manual Therapy   Manual therapy comments  Gentle soft tissue work to biceps, deltoid, noticed divet but could do a biceps contraction     Passive ROM  not well tolerated, to painful                PT Short Term Goals - 08/25/17 1348      PT SHORT TERM GOAL #1   Title  Patient will be I with initial HEP for Lt UE ROM and strength     Baseline  Pain limits progression,  Independent with exercises so far    Status  Partially Met      PT SHORT TERM GOAL #2   Title  Pt 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 UE      Status  On-going        PT Long Term Goals - 07/27/17 1348      PT LONG TERM GOAL #1   Title  Pt will be I with more advanced HEP for L UE     Time  8    Period  Weeks    Status  New    Target Date  09/20/17      PT LONG TERM GOAL #2   Title  Pt will score FOTO less than 40% to demo improved functional mobilty.     Time  8    Period  Weeks    Status  New    Target Date  09/20/17      PT LONG TERM GOAL #3   Title  Pt will be able to raise arm >130 deg for improved overhead reaching anf household tasks.     Time  8    Period  Weeks    Status  New    Target Date  09/20/17      PT LONG TERM GOAL #4   Title  Pt will demo strength in L UE in 4+/5 throughout for safety with lifting, maximizing functional use in and out of the home.     Time  8    Period  Weeks    Status  New    Target Date  09/20/17      PT LONG TERM GOAL #5   Title  Pt will report no difficulty with grooming and ADLs, to include reaching to lower back and behind head.     Time  8    Period  Weeks    Status  New  Target Date  09/20/17            Plan - 08/25/17 1349    Clinical Impression Statement  Patient is very limited in ROM and tolerance for manual. Introduced isometrics today.  Not taking any prescription meds for pain.  Progressing slower than average.  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      PT Treatment/Interventions  ADLs/Self Care Home Management;Cryotherapy;Ultrasound;Moist Heat;Electrical Stimulation;Therapeutic exercise;Therapeutic activities;Functional mobility training;Neuromuscular re-education;Patient/family education;Passive range of motion;Vasopneumatic  Device;Taping    PT Next Visit Plan  Progress AA as able. What did MD say/do? Marland Kitchen  Check biceps ( has been sore lately)    PT Home Exercise Plan  AAROM table slides and pendulums,  ER cane, isometrics     Consulted and Agree with Plan of Care  Patient       Patient will benefit from skilled therapeutic intervention in order to improve the following deficits and impairments:  Decreased mobility, Impaired sensation, Improper body mechanics, Increased edema, Decreased range of motion, Decreased activity tolerance, Decreased strength, Increased fascial restricitons, Impaired flexibility, Impaired UE functional use, Postural dysfunction, Pain  Visit Diagnosis: Acute pain of left shoulder  Localized edema  Stiffness of left shoulder, not elsewhere classified  Muscle weakness (generalized)     Problem List Patient Active Problem List   Diagnosis Date Noted  . Impingement syndrome of left shoulder 07/05/2017  . 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  . Umbilical hernia without obstruction and without gangrene   . Bilateral inguinal hernia without obstruction or gangrene   . History of tobacco abuse 11/26/2016  . Hypertension   . Hyperlipidemia   . Migraines   . Rotator cuff tendinitis, left 11/12/2015  . Labral tear of shoulder, left, initial encounter 11/12/2015    Eustacia Urbanek 08/25/2017, 2:23 PM  [facility] [address] [place], [name], [address] Phone: [phone]   Fax:  [phone]  Name: Elizeo Rodriques MRN: [id number] Date of Birth: [birth year]  Raeford Razor, PT 08/25/17 2:23 PM Phone: [phone] Fax: [phone]

## 2017-08-26 ENCOUNTER — Ambulatory Visit (INDEPENDENT_AMBULATORY_CARE_PROVIDER_SITE_OTHER): Payer: BLUE CROSS/BLUE SHIELD | Admitting: Orthopaedic Surgery

## 2017-09-07 ENCOUNTER — Ambulatory Visit: Payer: BLUE CROSS/BLUE SHIELD | Admitting: Physical Therapy

## 2017-09-07 ENCOUNTER — Encounter: Payer: Self-pay | Admitting: Physical Therapy

## 2017-09-07 DIAGNOSIS — M25612 Stiffness of left shoulder, not elsewhere classified: Secondary | ICD-10-CM

## 2017-09-07 DIAGNOSIS — M6281 Muscle weakness (generalized): Secondary | ICD-10-CM

## 2017-09-07 DIAGNOSIS — M25512 Pain in left shoulder: Secondary | ICD-10-CM

## 2017-09-07 DIAGNOSIS — R600 Localized edema: Secondary | ICD-10-CM

## 2017-09-07 NOTE — Therapy (Signed)
[place], [name], [address] Phone: [phone]   Fax:  [phone]  Physical Therapy Treatment  Patient Details  Name: Keith Barber MRN: [id number] Date of Birth: Apr 05, 1971 Referring Provider: Joni Fears, MD    Encounter Date: 09/07/2017  PT End of Session - 09/07/17 1513    Visit Number  9    Number of Visits  16    Date for PT Re-Evaluation  09/20/17    PT Start Time  1331    PT Stop Time  1430    PT Time Calculation (min)  59 min    Activity Tolerance  Patient tolerated treatment well    Behavior During Therapy  [facility] for tasks assessed/performed       Past Medical History:  Diagnosis Date  . Bone spur    LEFT SHOULDER THAT MAKES HIS LEFT ARM TINGLE  . GERD (gastroesophageal reflux disease)   . Hyperlipidemia   . Hypertension   . Migraines    rare migraines    Past Surgical History:  Procedure Laterality Date  . INGUINAL HERNIA REPAIR Bilateral 01/21/2017   Procedure: LAPAROSCOPIC BILATERAL INGUINAL HERNIA REPAIR;  Surgeon: Jules Husbands, MD;  Location: [facility];  Service: General;  Laterality: Bilateral;  . KNEE SURGERY Left   . SHOULDER ARTHROSCOPY WITH DISTAL CLAVICLE RESECTION Left 07/05/2017   Procedure: LEFT SHOULDER ARTHROSCOPY, SUBACROMIAL DECOMPRESSION, DISTAL CLAVICLE RESECTION, POSSIBLE MINI OPEN ROTATOR CUFF REPAIR;  Surgeon: [name], MD;  Location: [place];  Service: Orthopedics;  Laterality: Left;  . SHOULDER SURGERY Right   . UMBILICAL HERNIA REPAIR N/A 01/21/2017   Procedure: HERNIA REPAIR UMBILICAL ADULT;  Surgeon: Jules Husbands, MD;  Location: [facility];  Service: General;  Laterality: N/A;    There were no vitals filed for this visit.  Subjective Assessment - 09/07/17 1336    Subjective  Saw MD.  Pain 1-2/10.  Shoulder had deltoid bunched  and new ointment has helped.   Pain improving.  Needs less modalities at home.    Currently in Pain?  Yes    Pain Score   1     Pain Location  Shoulder    Pain Orientation  Left    Pain Descriptors / Indicators  Dull;Aching    Pain Radiating Towards  deltoid,  triceps    Pain Frequency  Intermittent    Aggravating Factors   rolling on shoulder and sleeping    Pain Relieving Factors  cream,  rest in comfortable position.      Effect of Pain on Daily Activities  limited by protocol.     Multiple Pain Sites  No         [facility] PT Assessment - 09/07/17 0001      AROM   Left Shoulder Flexion  120 Degrees in supine    Left Shoulder External Rotation  30 Degrees in supine                  [facility] Adult PT Treatment/Exercise - 09/07/17 0001      Shoulder Exercises: Supine   Other Supine Exercises  triceps 1,2,3 LBS 10 X each.  Elbow supported.    Other Supine Exercises  rythmic stab  10 x shoulder 90 degrees  10 passes up down arm from multiple directions,  light taps      Shoulder Exercises: Standing   Other Standing Exercises  ball on chair.  pressing both hande in closed chain work 10 X.  shakey,  no pain with this.     Other Standing Exercises  wall push up 10 X      Shoulder Exercises: ROM/Strengthening   UBE (Upper Arm Bike)  3 minutes forward,  2 minuted   reverse.   level 1      Shoulder Exercises: Isometric Strengthening   External Rotation  -- 10 x 5 sub max    Internal Rotation  -- 10 x 5 seconds sub max      Vasopneumatic   Number Minutes Vasopneumatic   15 minutes    Vasopnuematic Location   Shoulder    Vasopneumatic Pressure  Medium    Vasopneumatic Temperature   32      Manual Therapy   Manual therapy comments  gentle soft tissue work deltoid  , Pec.  tissue softened.  Palpation to pec reproduced pain into arm initially. 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 UE      Baseline  Mobility limited by protocol.  Painful.    Time  4    Period  Weeks    Status  On-going        PT Long Term Goals - 07/27/17 1348      PT LONG TERM GOAL #1   Title  Pt will be I with more advanced HEP for L UE     Time  8    Period  Weeks    Status  New    Target Date  09/20/17      PT LONG TERM GOAL #2   Title  Pt will score FOTO less than 40% to demo improved functional mobilty.     Time  8    Period  Weeks    Status  New    Target Date  09/20/17      PT LONG TERM GOAL #3   Title  Pt will  be able to raise arm >130 deg for improved overhead reaching anf household tasks.     Time  8    Period  Weeks    Status  New    Target Date  09/20/17      PT LONG TERM GOAL #4   Title  Pt will demo strength in L UE in 4+/5 throughout for safety with lifting, maximizing functional use in and out of the home.     Time  8    Period  Weeks    Status  New    Target Date  09/20/17      PT LONG TERM GOAL #5   Title  Pt will report no difficulty with grooming and ADLs, to include reaching to lower back and behind head.     Time  8    Period   Weeks    Status  New    Target Date  09/20/17            Plan - 09/07/17 1518    Clinical Impression Statement  ROM improving in supine 120 flexion and ER 30 degrees.  Closed chain work for labral tear was tolerated without increased pain. He returns to MD March 1st.   Mild pain noted at the end of session.  No new goals met.Patient is now 2 weeks post op.     PT Next Visit Plan  Progress AA as able.   Return to progressing with protocol  for both labral and rotator cuff repairs.   May need FOTO   PT Home Exercise Plan  AAROM table slides and pendulums,  ER cane, isometrics     Consulted and Agree with Plan of Care  Patient       Patient will benefit from skilled therapeutic intervention in order to improve the following deficits and impairments:     Visit Diagnosis: Acute pain of left shoulder  Localized edema  Stiffness of left shoulder, not elsewhere classified  Muscle weakness (generalized)     Problem List Patient Active Problem List   Diagnosis Date Noted  . Impingement syndrome of left shoulder 07/05/2017  . 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  . Umbilical hernia without obstruction and without gangrene   . Bilateral inguinal hernia without obstruction or gangrene   . History of tobacco abuse 11/26/2016  . Hypertension   . Hyperlipidemia   . Migraines   . Rotator cuff tendinitis, left 11/12/2015  . Labral tear of shoulder, left, initial encounter 11/12/2015    [facility]  PTA 09/07/2017, 3:23 PM  [facility] [address] [place], [name], [address] Phone: [phone]   Fax:  [phone]  Name: Vidit Boissonneault MRN: [id number] Date of Birth: [birth year]

## 2017-09-09 ENCOUNTER — Encounter: Payer: Self-pay | Admitting: Physical Therapy

## 2017-09-09 ENCOUNTER — Ambulatory Visit: Payer: BLUE CROSS/BLUE SHIELD | Admitting: Physical Therapy

## 2017-09-09 DIAGNOSIS — M6281 Muscle weakness (generalized): Secondary | ICD-10-CM

## 2017-09-09 DIAGNOSIS — M25512 Pain in left shoulder: Secondary | ICD-10-CM | POA: Diagnosis not present

## 2017-09-09 DIAGNOSIS — R600 Localized edema: Secondary | ICD-10-CM

## 2017-09-09 DIAGNOSIS — M25612 Stiffness of left shoulder, not elsewhere classified: Secondary | ICD-10-CM

## 2017-09-09 NOTE — Therapy (Signed)
[place], [name], [address] Phone: [phone]   Fax:  [phone]  Physical Therapy Treatment  Patient Details  Name: Keith Barber MRN: [id number] Date of Birth: July 07, 1971 Referring Provider: Joni Fears, MD    Encounter Date: 09/09/2017  PT End of Session - 09/09/17 1106    Visit Number  10    Number of Visits  16    Date for PT Re-Evaluation  09/20/17    PT Start Time  7035    PT Stop Time  1143    PT Time Calculation (min)  38 min    Activity Tolerance  Patient tolerated treatment well    Behavior During Therapy  [facility] for tasks assessed/performed       Past Medical History:  Diagnosis Date  . Bone spur    LEFT SHOULDER THAT MAKES HIS LEFT ARM TINGLE  . GERD (gastroesophageal reflux disease)   . Hyperlipidemia   . Hypertension   . Migraines    rare migraines    Past Surgical History:  Procedure Laterality Date  . INGUINAL HERNIA REPAIR Bilateral 01/21/2017   Procedure: LAPAROSCOPIC BILATERAL INGUINAL HERNIA REPAIR;  Surgeon: Jules Husbands, MD;  Location: [facility];  Service: General;  Laterality: Bilateral;  . KNEE SURGERY Left   . SHOULDER ARTHROSCOPY WITH DISTAL CLAVICLE RESECTION Left 07/05/2017   Procedure: LEFT SHOULDER ARTHROSCOPY, SUBACROMIAL DECOMPRESSION, DISTAL CLAVICLE RESECTION, POSSIBLE MINI OPEN ROTATOR CUFF REPAIR;  Surgeon: [name], MD;  Location: [place];  Service: Orthopedics;  Laterality: Left;  . SHOULDER SURGERY Right   . UMBILICAL HERNIA REPAIR N/A 01/21/2017   Procedure: HERNIA REPAIR UMBILICAL ADULT;  Surgeon: Jules Husbands, MD;  Location: [facility];  Service: General;  Laterality: N/A;    There were no vitals filed for this visit.  Subjective Assessment - 09/09/17 1106    Subjective  No pain really unless I reach my arm forward quickly or when rolling shoulder feeling clicking. Knot in deltoid is less but still there    Patient Stated Goals  Pt would  like to return to normal use of L UE     Currently in Pain?  Yes    Pain Score  1     Pain Location  Shoulder    Pain Orientation  Left                      [facility] Adult PT Treatment/Exercise - 09/09/17 0001      Shoulder Exercises: Supine   External Rotation Limitations  AAROM    Flexion  Other (comment) chest press with wand      Shoulder Exercises: Seated   Other Seated Exercises  ranger- flexion, ABD, ER    Other Seated Exercises  scapular retraction      Manual Therapy   Manual therapy comments  STM pectoralis, upper trap & levator    Joint Mobilization  inf glides in abd    Passive ROM  PROM to tolerance in supine               PT Short Term Goals - 09/07/17 1521      PT SHORT TERM GOAL #1   Title  Patient will be I with initial HEP for Lt UE ROM and strength     Baseline  ,  Independent with exercises so far    Time  4    Period  Weeks    Status  Partially Met  PT SHORT TERM GOAL #2   Title  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 UE      Baseline  Mobility limited by protocol.  Painful.    Time  4    Period  Weeks    Status  On-going        PT Long Term Goals - 07/27/17 1348      PT LONG TERM GOAL #1   Title  Pt will be I with more advanced HEP for L UE     Time  8    Period  Weeks    Status  New    Target Date  09/20/17      PT LONG TERM GOAL #2   Title  Pt will score FOTO less than 40% to demo improved functional mobilty.     Time  8    Period  Weeks     Status  New    Target Date  09/20/17      PT LONG TERM GOAL #3   Title  Pt will be able to raise arm >130 deg for improved overhead reaching anf household tasks.     Time  8    Period  Weeks    Status  New    Target Date  09/20/17      PT LONG TERM GOAL #4   Title  Pt will demo strength in L UE in 4+/5 throughout for safety with lifting, maximizing functional use in and out of the home.     Time  8    Period  Weeks    Status  New    Target Date  09/20/17      PT LONG TERM GOAL #5   Title  Pt will report no difficulty with grooming and ADLs, to include reaching to lower back and behind head.     Time  8    Period  Weeks    Status  New    Target Date  09/20/17            Plan - 09/09/17 1145    Clinical Impression Statement  Pt did not complain of notable pain upon arrival or after treatment but was experiencing sharp pain that referred to forearm during AAROM exercises in supine. Manual therapy with gentle mobs to encourage decreased anterior rotation of shoulder. Reviewed proper activation of periscapular region, overuse was leading to need for upper trap with fatigue.     PT Treatment/Interventions  ADLs/Self Care Home Management;Cryotherapy;Ultrasound;Moist Heat;Electrical Stimulation;Therapeutic exercise;Therapeutic activities;Functional mobility training;Neuromuscular re-education;Patient/family education;Passive range of motion;Vasopneumatic Device;Taping    PT Next Visit Plan  Progress AA as able.   Return to progressing with protocol  for both labral and rotator  cuff repairs.      PT Home Exercise Plan  AAROM table slides and pendulums,  ER cane, isometrics, scapular retraction    Consulted and Agree with Plan of Care  Patient       Patient will benefit from skilled therapeutic intervention in order to improve the following deficits and impairments:  Decreased mobility, Impaired sensation, Improper body mechanics, Increased edema, Decreased range of motion, Decreased  activity tolerance, Decreased strength, Increased fascial restricitons, Impaired flexibility, Impaired UE functional use, Postural dysfunction, Pain  Visit Diagnosis: Acute pain of left shoulder  Localized edema  Stiffness of left shoulder, not elsewhere classified  Muscle weakness (generalized)     Problem List Patient Active Problem List   Diagnosis Date Noted  . Impingement syndrome of left shoulder 07/05/2017  . 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  . Umbilical hernia without obstruction and without gangrene   . Bilateral inguinal hernia without obstruction or gangrene   . History of tobacco abuse 11/26/2016  . Hypertension   . Hyperlipidemia   . Migraines   . Rotator cuff tendinitis, left 11/12/2015  . Labral tear of shoulder, left, initial encounter 11/12/2015   Mayah Urquidi C. Artrell Lawless PT, DPT 09/09/17 11:48 AM   [place] [address] [place], [name], [address] Phone: [phone]   Fax:  [phone]  Name: Keith Barber MRN: [id number] Date of Birth: 1971/03/25

## 2017-09-14 ENCOUNTER — Encounter: Payer: Self-pay | Admitting: Physical Therapy

## 2017-09-14 ENCOUNTER — Ambulatory Visit: Payer: BLUE CROSS/BLUE SHIELD | Admitting: Physical Therapy

## 2017-09-14 DIAGNOSIS — M25512 Pain in left shoulder: Secondary | ICD-10-CM

## 2017-09-14 DIAGNOSIS — R600 Localized edema: Secondary | ICD-10-CM

## 2017-09-14 DIAGNOSIS — M25612 Stiffness of left shoulder, not elsewhere classified: Secondary | ICD-10-CM

## 2017-09-14 DIAGNOSIS — M6281 Muscle weakness (generalized): Secondary | ICD-10-CM

## 2017-09-14 NOTE — Therapy (Signed)
[place], [name], [address] Phone: [phone]   Fax:  [phone]  Physical Therapy Treatment  Patient Details  Name: Keith Barber MRN: [id number] Date of Birth: 22-May-1971 Referring Provider: Joni Fears, MD    Encounter Date: 09/14/2017    Past Medical History:  Diagnosis Date  . Bone spur    LEFT SHOULDER THAT MAKES HIS LEFT ARM TINGLE  . GERD (gastroesophageal reflux disease)   . Hyperlipidemia   . Hypertension   . Migraines    rare migraines    Past Surgical History:  Procedure Laterality Date  . INGUINAL HERNIA REPAIR Bilateral 01/21/2017   Procedure: LAPAROSCOPIC BILATERAL INGUINAL HERNIA REPAIR;  Surgeon: Jules Husbands, MD;  Location: [facility];  Service: General;  Laterality: Bilateral;  . KNEE SURGERY Left   . SHOULDER ARTHROSCOPY WITH DISTAL CLAVICLE RESECTION Left 07/05/2017   Procedure: LEFT SHOULDER ARTHROSCOPY, SUBACROMIAL DECOMPRESSION, DISTAL CLAVICLE RESECTION, POSSIBLE MINI OPEN ROTATOR CUFF REPAIR;  Surgeon: [name], MD;  Location: [place];  Service: Orthopedics;  Laterality: Left;  . SHOULDER SURGERY Right   . UMBILICAL HERNIA REPAIR N/A 01/21/2017   Procedure: HERNIA REPAIR UMBILICAL ADULT;  Surgeon: Jules Husbands, MD;  Location: [facility];  Service: General;  Laterality: N/A;    There were no vitals filed for this visit.  Subjective Assessment - 09/14/17 1357    Subjective  no pain.  once in awhile it will spike if I turn body wrong way    Currently in Pain?  No/denies    Pain Location  Shoulder    Pain Orientation  Left                      [facility] Adult PT Treatment/Exercise - 09/14/17 0001      Shoulder Exercises: Supine   External Rotation Limitations  AAROM    Flexion  -- cane chest press 3 x stopped due to pain bicep      Shoulder Exercises: Seated   Other Seated Exercises  ranger- flexion, ABD, ER cued for pain free range    Other  Seated Exercises  ball press both 10 x,  ball press and roll into flexion 10 X these were comfortable.    with yellow band under elbow,  scapular depression 10 x 2 sets,  this helped patiebt lower shoulder at rest. ,  improve pain and posture      Shoulder Exercises: Isometric Strengthening   External Rotation  5X5"    Internal Rotation  5X5" sub max      Manual Therapy   Manual therapy comments  STM pectoralis   very sensitive increased shooting pain into bicep initially, upper trap & levator    Joint Mobilization  inf glides in abd    Passive ROM  PROM to tolerance in supine. Attempted  gentle pain free .  hard for patient to relax so stopped.                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 UE      Baseline  Mobility limited by protocol.  Painful.    Time  4    Period  Weeks    Status  On-going        PT Long Term Goals - 07/27/17 1348      PT LONG TERM GOAL #1   Title  Pt will be I with more advanced HEP for L UE     Time  8    Period  Weeks    Status  New    Target Date  09/20/17      PT LONG TERM GOAL #2   Title  Pt will score FOTO less than 40% to demo improved functional mobilty.     Time  8    Period  Weeks    Status  New    Target Date   09/20/17      PT LONG TERM GOAL #3   Title  Pt will be able to raise arm >130 deg for improved overhead reaching anf household tasks.     Time  8    Period  Weeks    Status  New    Target Date  09/20/17      PT LONG TERM GOAL #4   Title  Pt will demo strength in L UE in 4+/5 throughout for safety with lifting, maximizing functional use in and out of the home.     Time  8    Period  Weeks    Status  New    Target Date  09/20/17      PT LONG TERM GOAL #5   Title  Pt will report no difficulty with grooming and ADLs, to include reaching to lower back and behind head.     Time  8    Period  Weeks    Status  New    Target Date  09/20/17            Plan - 09/14/17 1621    Clinical Impression Statement  STG#2 met.  He is not tolerating AAROM in supine with guarding and pain.  He is tolerating closed chain exercises without pain .  Patient continues to need review of proper activation of periscapular region ,  overuse was leading to need for upper trap with fatigue ( noted last visit)    PT Next Visit Plan  Progress AA as able.   Return to progressing with protocol  for both labral and rotator cuff repairs.      PT Home Exercise Plan  AAROM table slides and pendulums,  ER cane, isometrics, scapular retraction    Consulted and Agree with Plan of Care  Patient       Patient will  benefit from skilled therapeutic intervention in order to improve the following deficits and impairments:     Visit Diagnosis: Acute pain of left shoulder  Localized edema  Stiffness of left shoulder, not elsewhere classified  Muscle weakness (generalized)     Problem List Patient Active Problem List   Diagnosis Date Noted  . Impingement syndrome of left shoulder 07/05/2017  . 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  . Umbilical hernia without obstruction and without gangrene   . Bilateral inguinal hernia without obstruction or gangrene   . History of tobacco abuse  11/26/2016  . Hypertension   . Hyperlipidemia   . Migraines   . Rotator cuff tendinitis, left 11/12/2015  . Labral tear of shoulder, left, initial encounter 11/12/2015    [facility]  PTA 09/14/2017, 4:25 PM  [facility] [address] [place], [name], [address] Phone: [phone]   Fax:  [phone]  Name: [name] MRN: [id number] Date of Birth: [birth year]

## 2017-09-15 ENCOUNTER — Encounter: Payer: Self-pay | Admitting: Family Medicine

## 2017-09-16 ENCOUNTER — Encounter: Payer: Self-pay | Admitting: Physical Therapy

## 2017-09-16 ENCOUNTER — Ambulatory Visit (INDEPENDENT_AMBULATORY_CARE_PROVIDER_SITE_OTHER): Payer: BLUE CROSS/BLUE SHIELD | Admitting: Orthopaedic Surgery

## 2017-09-16 ENCOUNTER — Encounter (INDEPENDENT_AMBULATORY_CARE_PROVIDER_SITE_OTHER): Payer: Self-pay | Admitting: Orthopaedic Surgery

## 2017-09-16 ENCOUNTER — Ambulatory Visit: Payer: BLUE CROSS/BLUE SHIELD | Attending: Orthopaedic Surgery | Admitting: Physical Therapy

## 2017-09-16 VITALS — BP 127/85 | HR 104 | Resp 16 | Ht 69.0 in | Wt 195.0 lb

## 2017-09-16 DIAGNOSIS — M25612 Stiffness of left shoulder, not elsewhere classified: Secondary | ICD-10-CM | POA: Diagnosis present

## 2017-09-16 DIAGNOSIS — G8929 Other chronic pain: Secondary | ICD-10-CM

## 2017-09-16 DIAGNOSIS — M25512 Pain in left shoulder: Secondary | ICD-10-CM | POA: Diagnosis not present

## 2017-09-16 DIAGNOSIS — R600 Localized edema: Secondary | ICD-10-CM | POA: Diagnosis present

## 2017-09-16 DIAGNOSIS — K5732 Diverticulitis of large intestine without perforation or abscess without bleeding: Secondary | ICD-10-CM

## 2017-09-16 DIAGNOSIS — M6281 Muscle weakness (generalized): Secondary | ICD-10-CM | POA: Diagnosis present

## 2017-09-16 HISTORY — DX: Diverticulitis of large intestine without perforation or abscess without bleeding: K57.32

## 2017-09-16 NOTE — Patient Instructions (Signed)
    EXTENSION: Standing - Resistance Band: Stable (Active)    Stand, right arm at side. Against yellow resistance band, draw arm backward, as far as possible, keeping elbow straight. Complete __1-2_ sets of ___10 repetitions. Perform _1-2__ sessions per day.  Copyright  VHI. All rights reserved.  Resisted External Rotation: in Neutral - Bilateral    Sit or stand, tubing in both hands, elbows at sides, bent to 90, forearms forward. Pinch shoulder blades together and rotate forearms out. Keep elbows at sides. Repeat __10__ times per set. Do __1-2__ sets per session. Do __2__ sessions per day.  http://orth.exer.us/966   Copyright  VHI. All rights reserved.    Low Row: Standing    Face anchor, feet shoulder width apart. Palms up, pull arms back, squeezing shoulder blades together. Repeat _10_ times per set. Do _2_ sets per session. Do 4-5_ sessions per week. Anchor Height: Waist  http://tub.exer.us/65   Copyright  VHI. All rights reserved.

## 2017-09-16 NOTE — Therapy (Signed)
[name], [name], [address] Phone: [phone]   Fax:  [phone]  Physical Therapy Treatment/Renewal  Patient Details  Name: Keith Barber MRN: [id number] Date of Birth: 1971/07/03 Referring Provider: Joni Fears, MD    Encounter Date: 09/16/2017  PT End of Session - 09/16/17 1146    Visit Number  11    Number of Visits  23    Date for PT Re-Evaluation  11/01/17    PT Start Time  1100    PT Stop Time  1154    PT Time Calculation (min)  54 min    Activity Tolerance  Patient tolerated treatment well    Behavior During Therapy  [facility] for tasks assessed/performed       Past Medical History:  Diagnosis Date  . Bone spur    LEFT SHOULDER THAT MAKES HIS LEFT ARM TINGLE  . GERD (gastroesophageal reflux disease)   . Hyperlipidemia   . Hypertension   . Migraines    rare migraines    Past Surgical History:  Procedure Laterality Date  . INGUINAL HERNIA REPAIR Bilateral 01/21/2017   Procedure: LAPAROSCOPIC BILATERAL INGUINAL HERNIA REPAIR;  Surgeon: Jules Husbands, MD;  Location: [facility];  Service: General;  Laterality: Bilateral;  . KNEE SURGERY Left   . SHOULDER ARTHROSCOPY WITH DISTAL CLAVICLE RESECTION Left 07/05/2017   Procedure: LEFT SHOULDER ARTHROSCOPY, SUBACROMIAL DECOMPRESSION, DISTAL CLAVICLE RESECTION, POSSIBLE MINI OPEN ROTATOR CUFF REPAIR;  Surgeon: [name], MD;  Location: [place];  Service: Orthopedics;  Laterality: Left;  . SHOULDER SURGERY Right   . UMBILICAL HERNIA REPAIR N/A 01/21/2017   Procedure: HERNIA REPAIR UMBILICAL ADULT;  Surgeon: Jules Husbands, MD;  Location: [facility];  Service: General;  Laterality: N/A;    There were no vitals filed for this visit.  Subjective Assessment - 09/16/17 1101    Subjective  Saw the MD.  Told me things are healing.  Not sleeping well, woke up x 6 last night. Pain this AM was bad.     Currently in Pain?  Yes    Pain Score  1     Pain  Location  Shoulder    Pain Orientation  Left    Pain Descriptors / Indicators  Aching;Sore pinch     Pain Type  Surgical pain    Pain Onset  More than a month ago         [facility] PT Assessment - 09/16/17 0001      AROM   Left Shoulder Flexion  125 Degrees standing at wall     Left Shoulder ABduction  100 Degrees AAROM     Left Shoulder External Rotation  40 Degrees      Strength   Overall Strength Comments  no more than 3/5 in avail ROM           [facility] Adult PT Treatment/Exercise - 09/16/17 0001      Shoulder Exercises: Standing   Protraction  AAROM;Strengthening;Both;10 reps used Rt UE to block elbow flexion     External Rotation  Strengthening;Left;10 reps;Theraband    Theraband Level (Shoulder External Rotation)  Level 2 (Red)    Extension  Strengthening;Both;10 reps;Theraband    Theraband Level (Shoulder Extension)  Level 2 (Red)    Row  Strengthening;Both;15 reps;Theraband    Theraband Level (Shoulder Row)  Level 2 (Red)    Other Standing Exercises  WB on countertop, alternating lift each arm.       Shoulder Exercises:  ROM/Strengthening   UBE (Upper Arm Bike)  5 min L1 passive for LUE     Modified Plank  --    Other ROM/Strengthening Exercises  standing ball for AAROM closed chain : flexion , scaption , circles on high table     Other ROM/Strengthening Exercises  isometric with large ball: extension , adduction       Shoulder Exercises: Isometric Strengthening   Extension  5X10" intoball in standing       Modalities   Modalities  Cryotherapy      Cryotherapy   Number Minutes Cryotherapy  10 Minutes    Cryotherapy Location  Shoulder    Type of Cryotherapy  Ice pack      Manual Therapy   Manual therapy comments  compression to Upper traps, pec minor prior     Joint Mobilization  inf glides in abd    Passive ROM  PROM to tolerance all planes, distraction , oscillation , gentle to avoid pain                PT Short Term Goals - 09/16/17 1148      PT  SHORT TERM GOAL #1   Title  Patient will be I with initial HEP for Lt UE ROM and strength     Status  Achieved      PT SHORT TERM GOAL #2   Title  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 UE      Status  Achieved        PT Long Term Goals - 09/16/17 1149      PT LONG TERM GOAL #1   Title  Pt will be I with more advanced HEP for L UE     Status  On-going      PT LONG TERM GOAL #2   Title  Pt will score FOTO less than 40% to demo improved functional mobilty.     Status  On-going      PT LONG TERM GOAL #3   Title  Pt will be able to raise arm >130 deg for improved overhead reaching anf household tasks.     Status  On-going      PT LONG TERM GOAL #4   Title  Pt will demo strength in L UE in 4+/5 throughout for safety with lifting, maximizing functional use in and out of the home.     Status  On-going      PT LONG TERM GOAL #5   Title  Pt will report no difficulty with grooming and ADLs, to include reaching to lower back and behind head.     Status  On-going            Plan - 09/16/17 1150    Clinical Impression Statement  Patient generally has pain 1/10-2/10 at rest but has intermittent twinges with certain movements.  Did well today but does need increased time to relax upper traps, pecs to prepare for exercises.  given Red band for strengthening today.  Cont for 4-6 more  weeks.     PT Next Visit Plan  Progress AA as able.   Return to progressing with protocol  for both labral and rotator cuff repairs.      PT Home Exercise Plan  AAROM table slides and pendulums,  ER cane, isometrics, scapular retraction, ext, row and ER with red band     Consulted and Agree with Plan of Care   Patient       Patient will benefit from skilled therapeutic intervention in order to improve the following deficits and impairments:  Decreased mobility, Impaired sensation, Improper body mechanics, Increased edema, Decreased range of motion, Decreased activity tolerance, Decreased strength, Increased fascial restricitons, Impaired flexibility, Impaired UE functional use, Postural dysfunction, Pain  Visit Diagnosis: Acute pain of left shoulder  Localized edema  Stiffness of left shoulder, not elsewhere classified  Muscle weakness (generalized)     Problem List Patient Active Problem List   Diagnosis Date Noted  . Impingement syndrome of left shoulder 07/05/2017  . 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  . Umbilical hernia without obstruction and without gangrene   . Bilateral inguinal hernia without obstruction or gangrene   . History of tobacco abuse 11/26/2016  . Hypertension   . Hyperlipidemia   . Migraines   . Rotator cuff tendinitis, left 11/12/2015  . Labral tear of shoulder, left, initial encounter 11/12/2015    Saori Umholtz 09/16/2017, 12:05 PM  [place], [name], [address] Phone: [phone]   Fax:  [phone]  Name: Keith Barber MRN: [id number] Date of Birth: Dec 14, 1970  Raeford Razor, PT 09/16/17 12:05 PM Phone: [phone] Fax: [phone]

## 2017-09-16 NOTE — Progress Notes (Signed)
Office Visit Note   Patient: Keith Barber           Date of Birth: [birth year]           MRN: [id number] Visit Date: 09/16/2017              Requested by: Volney American, PA-C [place], [place] [address] PCP: Volney American, PA-C   Assessment & Plan: Visit Diagnoses:  1. Chronic left shoulder pain     Plan: 10 weeks status post arthroscopic SCD, DCR and anterior glenoid labral repair. Continues to go to physical therapy. He has developed adhesive capsulitis and is working with therapy. We'll use NSAIDs and Tylenol at night. Pennsaid cream. Continue home exercises. Office 6 weeks. Patient feels that he is better than he was a month ago. Continues to work  Follow-Up Instructions: Return in about 6 weeks (around 10/28/2017).   Orders:  No orders of the defined types were placed in this encounter.  No orders of the defined types were placed in this encounter.     Procedures: No procedures performed   Clinical Data: No additional findings.   Subjective: Chief Complaint  Patient presents with  . Left Shoulder - Routine Post Op    [name] is a 47 y o S/P nearly 10 weeks  arthroscopic SCD DCR and repair of an anterior glenoid labral tear. Pt in PT spending 1/3 time PT time to loosen the muscle for PT. 1-2/10 pain.  Still having some difficulty at night with some shoulder pain. Definitely better than he was a month ago. Still having some difficulty with range of motion  HPI  Review of Systems  Constitutional: Negative for fatigue.  HENT: Negative for hearing loss.   Respiratory: Negative for apnea, chest tightness and shortness of breath.   Cardiovascular: Negative for chest pain, palpitations and leg swelling.  Gastrointestinal: Negative for blood in stool, constipation and diarrhea.  Genitourinary: Negative for difficulty urinating.  Musculoskeletal: Positive for neck stiffness. Negative for arthralgias, back pain, joint swelling, myalgias and  neck pain.  Neurological: Negative for weakness, numbness and headaches.  Hematological: Does not bruise/bleed easily.  Psychiatric/Behavioral: Positive for sleep disturbance. The patient is not nervous/anxious.      Objective: Vital Signs: BP 127/85   Pulse (!) 104   Resp 16   Ht 5\' 9"  (1.753 m)   Wt 195 lb (88.5 kg)   BMI 28.80 kg/m   Physical Exam  Ortho Exam has adhesive capsulitis. I can abduct 90 and flex about 100 which point he was uncomfortable. Seems to have pain out of proportion to what I would normally expect . Skin intact. Neurovascular exam is intact. His limitation of flexion and external rotation.   Imaging: No results found.   PMFS History: Patient Active Problem List   Diagnosis Date Noted  . Impingement syndrome of left shoulder 07/05/2017  . 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  . Umbilical hernia without obstruction and without gangrene   . Bilateral inguinal hernia without obstruction or gangrene   . History of tobacco abuse 11/26/2016  . Hypertension   . Hyperlipidemia   . Migraines   . Rotator cuff tendinitis, left 11/12/2015  . Labral tear of shoulder, left, initial encounter 11/12/2015   Past Medical History:  Diagnosis Date  . Bone spur    LEFT SHOULDER THAT MAKES HIS LEFT ARM TINGLE  . GERD (gastroesophageal reflux disease)   . Hyperlipidemia   . Hypertension   .  Migraines    rare migraines    Family History  Problem Relation Age of Onset  . Diabetes Mother   . Migraines Mother   . Cancer Neg Hx   . COPD Neg Hx   . Heart disease Neg Hx   . Stroke Neg Hx     Past Surgical History:  Procedure Laterality Date  . INGUINAL HERNIA REPAIR Bilateral 01/21/2017   Procedure: LAPAROSCOPIC BILATERAL INGUINAL HERNIA REPAIR;  Surgeon: Jules Husbands, MD;  Location: [facility];  Service: General;  Laterality: Bilateral;  . KNEE SURGERY Left   . SHOULDER ARTHROSCOPY WITH DISTAL CLAVICLE RESECTION Left 07/05/2017    Procedure: LEFT SHOULDER ARTHROSCOPY, SUBACROMIAL DECOMPRESSION, DISTAL CLAVICLE RESECTION, POSSIBLE MINI OPEN ROTATOR CUFF REPAIR;  Surgeon: [name], MD;  Location: [facility];  Service: Orthopedics;  Laterality: Left;  . SHOULDER SURGERY Right   . UMBILICAL HERNIA REPAIR N/A 01/21/2017   Procedure: HERNIA REPAIR UMBILICAL ADULT;  Surgeon: Jules Husbands, MD;  Location: [facility];  Service: General;  Laterality: N/A;   Social History   Occupational History  . Not on file  Tobacco Use  . Smoking status: Former Smoker    Packs/day: 0.50    Years: 26.00    Pack years: 13.00    Types: Cigarettes    Last attempt to quit: 01/13/2017    Years since quitting: 0.6  . Smokeless tobacco: Never Used  . Tobacco comment: PT STARTED TAKING ZYBAN ON 01-08-17 IN HOPES OF QUITTING  Substance and Sexual Activity  . Alcohol use: No  . Drug use: No  . Sexual activity: Not on file

## 2017-09-20 ENCOUNTER — Encounter: Payer: Self-pay | Admitting: Physical Therapy

## 2017-09-20 ENCOUNTER — Ambulatory Visit: Payer: BLUE CROSS/BLUE SHIELD | Admitting: Physical Therapy

## 2017-09-20 DIAGNOSIS — M25512 Pain in left shoulder: Secondary | ICD-10-CM | POA: Diagnosis not present

## 2017-09-20 DIAGNOSIS — M25612 Stiffness of left shoulder, not elsewhere classified: Secondary | ICD-10-CM

## 2017-09-20 DIAGNOSIS — M6281 Muscle weakness (generalized): Secondary | ICD-10-CM

## 2017-09-20 DIAGNOSIS — R600 Localized edema: Secondary | ICD-10-CM

## 2017-09-20 NOTE — Therapy (Signed)
[place], [name], [address] Phone: [phone]   Fax:  [phone]  Physical Therapy Treatment  Patient Details  Name: Isacc Turney MRN: [id number] Date of Birth: [birth year] Referring Provider: Joni Fears, MD    Encounter Date: 09/20/2017  PT End of Session - 09/20/17 0853    Visit Number  12    Number of Visits  23    Date for PT Re-Evaluation  11/01/17    PT Start Time  0847    PT Stop Time  0940    PT Time Calculation (min)  53 min       Past Medical History:  Diagnosis Date  . Bone spur    LEFT SHOULDER THAT MAKES HIS LEFT ARM TINGLE  . GERD (gastroesophageal reflux disease)   . Hyperlipidemia   . Hypertension   . Migraines    rare migraines    Past Surgical History:  Procedure Laterality Date  . INGUINAL HERNIA REPAIR Bilateral 01/21/2017   Procedure: LAPAROSCOPIC BILATERAL INGUINAL HERNIA REPAIR;  Surgeon: Jules Husbands, MD;  Location: [facility];  Service: General;  Laterality: Bilateral;  . KNEE SURGERY Left   . SHOULDER ARTHROSCOPY WITH DISTAL CLAVICLE RESECTION Left 07/05/2017   Procedure: LEFT SHOULDER ARTHROSCOPY, SUBACROMIAL DECOMPRESSION, DISTAL CLAVICLE RESECTION, POSSIBLE MINI OPEN ROTATOR CUFF REPAIR;  Surgeon: [name], MD;  Location: [place];  Service: Orthopedics;  Laterality: Left;  . SHOULDER SURGERY Right   . UMBILICAL HERNIA REPAIR N/A 01/21/2017   Procedure: HERNIA REPAIR UMBILICAL ADULT;  Surgeon: Jules Husbands, MD;  Location: [facility];  Service: General;  Laterality: N/A;    There were no vitals filed for this visit.  Subjective Assessment - 09/20/17 0850    Subjective  Still waking up 1 x per night even if I take Aleve. This is my first morning appointment, it might hurt more to stretch this early.     Currently in Pain?  No/denies    Aggravating Factors   certain ways I move the arm, rolling on left side.     Pain Relieving Factors  Aleve, rest,  comfortable position         [facility] PT Assessment - 09/20/17 0001      AROM   Left Shoulder Flexion  135 Degrees    Left Shoulder ABduction  105 Degrees    Left Shoulder External Rotation  25 Degrees @ 30 degrees abduction                  [facility] Adult PT Treatment/Exercise - 09/20/17 0001      Shoulder Exercises: Supine   Other Supine Exercises  Supine chest press with wooden dowel, ER x 10 each       Shoulder Exercises: Standing   External Rotation  Strengthening;Left;10 reps;Theraband    Theraband Level (Shoulder External Rotation)  Level 2 (Red)    Extension  Strengthening;Both;10 reps;Theraband    Theraband Level (Shoulder Extension)  Level 2 (Red)    Row  Strengthening;Both;15 reps;Theraband    Theraband Level (Shoulder Row)  Level 2 (Red)    Other Standing Exercises  WB on countertop, alternating lift each arm.     Other Standing Exercises  Standing  cane flexion, abduction-has low back pain- cues to stand up straight/engage abdominals.       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 UE      Status  Achieved        PT Long Term Goals - 09/16/17 1149      PT LONG TERM GOAL #1   Title  Pt will be I with more advanced HEP for L UE     Status  On-going      PT LONG TERM GOAL #2   Title  Pt will score FOTO less than 40% to demo improved functional mobilty.     Status  On-going      PT LONG TERM GOAL #3   Title  Pt will be able to raise arm >130 deg for improved overhead reaching anf household tasks.     Status  On-going      PT LONG TERM GOAL #4   Title  Pt will demo strength in L UE in 4+/5 throughout for safety with lifting, maximizing functional use in and out of the home.     Status  On-going      PT LONG TERM GOAL #5   Title  Pt will report no difficulty with grooming and ADLs, to include reaching to lower back and behind head.     Status  On-going            Plan - 09/20/17 9371    Clinical Impression Statement  Improved tolerance to AAROM exercises. Able to tolerate pulleys and cane exercises. Max cues for relaxation with PROM. Cold pack at end of session.     PT Next Visit Plan  Progress AA as able.   Return to progressing with protocol  for both labral and rotator cuff repairs.      PT Home Exercise Plan  AAROM table slides and pendulums,  ER cane, isometrics, scapular retraction, ext, row and ER with red band     Consulted and Agree with Plan of Care  Patient       Patient will benefit from skilled therapeutic intervention in order to improve the following deficits and impairments:  Decreased mobility, Impaired sensation, Improper body mechanics, Increased edema, Decreased range of motion, Decreased activity tolerance, Decreased strength, Increased fascial restricitons, Impaired flexibility, Impaired UE functional use, Postural dysfunction, Pain  Visit Diagnosis: Acute pain of  left shoulder  Localized edema  Stiffness of left shoulder, not elsewhere classified  Muscle weakness (generalized)     Problem List Patient Active Problem  List   Diagnosis Date Noted  . Impingement syndrome of left shoulder 07/05/2017  . 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  . Umbilical hernia without obstruction and without gangrene   . Bilateral inguinal hernia without obstruction or gangrene   . History of tobacco abuse 11/26/2016  . Hypertension   . Hyperlipidemia   . Migraines   . Rotator cuff tendinitis, left 11/12/2015  . Labral tear of shoulder, left, initial encounter 11/12/2015    Dorene Ar, PTA 09/20/2017, 9:46 AM  [place], [name], [address] Phone: [phone]   Fax:  [phone]  Name: Shirl Ludington MRN: [id number] Date of Birth: 1971/03/20

## 2017-09-21 ENCOUNTER — Ambulatory Visit (INDEPENDENT_AMBULATORY_CARE_PROVIDER_SITE_OTHER): Payer: BLUE CROSS/BLUE SHIELD | Admitting: Family Medicine

## 2017-09-21 ENCOUNTER — Encounter: Payer: Self-pay | Admitting: Family Medicine

## 2017-09-21 VITALS — BP 130/92 | HR 92 | Temp 98.4°F | Wt 208.0 lb

## 2017-09-21 DIAGNOSIS — R05 Cough: Secondary | ICD-10-CM

## 2017-09-21 DIAGNOSIS — J029 Acute pharyngitis, unspecified: Secondary | ICD-10-CM | POA: Diagnosis not present

## 2017-09-21 DIAGNOSIS — R053 Chronic cough: Secondary | ICD-10-CM

## 2017-09-21 MED ORDER — SUCRALFATE 1 GM/10ML PO SUSP: 1.0000 g | Freq: Three times a day (TID) | ORAL | 0 refills | Status: DC

## 2017-09-21 MED ORDER — FLUTICASONE PROPIONATE 50 MCG/ACT NA SUSP: 2.0000 | Freq: Two times a day (BID) | NASAL | 6 refills | Status: DC

## 2017-09-21 NOTE — Progress Notes (Signed)
BP (!) 130/92   Pulse 92   Temp 98.4 F (36.9 C) (Oral)   Wt 208 lb (94.3 kg)   SpO2 96%   BMI 30.72 kg/m    Subjective:    Patient ID: Keith Barber, male    DOB: 1971/06/19, 47 y.o.   MRN: [id number]  HPI: Keith Barber is a 47 y.o. male  Chief Complaint  Patient presents with  . Sore Throat   Scratchy throat worst about 3 weeks ago, may have been going on longer than that (several months possibly). Feels it worst in the morning, especially when drinking something carbonated. Was intubated for surgery 3 months ago and wonders if this is residual from that. Has tried drinking more water and chewing gum, also started reflux medications which has helped with GERD sxs significantly. Denies fevers, noticed post-nasal drainage, dysphagia, vomiting.   Still also having hacking "smoker's cough" even though he hasn't smoked in 9 months. 24 pack year smoking hx. Has not tried anything for sxs. No known pulm dz.   Past Medical History:  Diagnosis Date  . Bone spur    LEFT SHOULDER THAT MAKES HIS LEFT ARM TINGLE  . GERD (gastroesophageal reflux disease)   . Hyperlipidemia   . Hypertension   . Migraines    rare migraines   Social History   Socioeconomic History  . Marital status: Divorced    Spouse name: Not on file  . Number of children: Not on file  . Years of education: Not on file  . Highest education level: Not on file  Social Needs  . Financial resource strain: Not on file  . Food insecurity - worry: Not on file  . Food insecurity - inability: Not on file  . Transportation needs - medical: Not on file  . Transportation needs - non-medical: Not on file  Occupational History  . Not on file  Tobacco Use  . Smoking status: Former Smoker    Packs/day: 0.50    Years: 26.00    Pack years: 13.00    Types: Cigarettes    Last attempt to quit: 01/13/2017    Years since quitting: 0.6  . Smokeless tobacco: Never Used  . Tobacco comment: PT STARTED TAKING ZYBAN ON 01-08-17 IN  HOPES OF QUITTING  Substance and Sexual Activity  . Alcohol use: No  . Drug use: No  . Sexual activity: Not on file  Other Topics Concern  . Not on file  Social History Narrative  . Not on file    Relevant past medical, surgical, family and social history reviewed and updated as indicated. Interim medical history since our last visit reviewed. Allergies and medications reviewed and updated.  Review of Systems  Per HPI unless specifically indicated above     Objective:    BP (!) 130/92   Pulse 92   Temp 98.4 F (36.9 C) (Oral)   Wt 208 lb (94.3 kg)   SpO2 96%   BMI 30.72 kg/m   Wt Readings from Last 3 Encounters:  09/21/17 208 lb (94.3 kg)  09/16/17 195 lb (88.5 kg)  08/25/17 200 lb (90.7 kg)    Physical Exam  Constitutional: He appears well-developed and well-nourished. No distress.  HENT:  Head: Atraumatic.  Right Ear: External ear normal.  Left Ear: External ear normal.  Nose: Nose normal.  Mouth/Throat: No oropharyngeal exudate.  Oropharynx mildly erythematous posteriorly  Eyes: Conjunctivae are normal. Pupils are equal, round, and reactive to light. No scleral icterus.  Neck: Normal  range of motion. Neck supple.  Cardiovascular: Normal rate and normal heart sounds.  Pulmonary/Chest: Effort normal and breath sounds normal. No respiratory distress. He has no wheezes.  Musculoskeletal: Normal range of motion.  Neurological: He is alert.  Skin: Skin is warm and dry.  Psychiatric: He has a normal mood and affect. His behavior is normal.  Nursing note and vitals reviewed.   Results for orders placed or performed in visit on 07/15/17  Comprehensive metabolic panel  Result Value Ref Range   Glucose 110 (H) 65 - 99 mg/dL   BUN 15 6 - 24 mg/dL   Creatinine, Ser 0.88 0.76 - 1.27 mg/dL   GFR calc non Af Amer 102 >59 mL/min/1.73   GFR calc Af Amer 118 >59 mL/min/1.73   BUN/Creatinine Ratio 17 9 - 20   Sodium 141 134 - 144 mmol/L   Potassium 4.3 3.5 - 5.2 mmol/L     Chloride 105 96 - 106 mmol/L   CO2 20 20 - 29 mmol/L   Calcium 8.8 8.7 - 10.2 mg/dL   Total Protein 7.1 6.0 - 8.5 g/dL   Albumin 4.4 3.5 - 5.5 g/dL   Globulin, Total 2.7 1.5 - 4.5 g/dL   Albumin/Globulin Ratio 1.6 1.2 - 2.2   Bilirubin Total 0.5 0.0 - 1.2 mg/dL   Alkaline Phosphatase 125 (H) 39 - 117 IU/L   AST 24 0 - 40 IU/L   ALT 51 (H) 0 - 44 IU/L  Lipid Panel w/o Chol/HDL Ratio  Result Value Ref Range   Cholesterol, Total 148 100 - 199 mg/dL   Triglycerides 189 (H) 0 - 149 mg/dL   HDL 32 (L) >39 mg/dL   VLDL Cholesterol Cal 38 5 - 40 mg/dL   LDL Calculated 78 0 - 99 mg/dL      Assessment & Plan:   Problem List Items Addressed This Visit    None    Visit Diagnoses    Sore throat    -  Primary   Will trial flonase BID and carafate QID prn as well as continue PPI, monitor closely for benefit. OTC throat sprays if needed for comfort   Chronic cough       Spirometry today with mild restriction. Start asmanex sample inhaler and repeat testing after several weeks on that. Can take delsym prn   Relevant Orders   Spirometry with Graph (Completed)       Follow up plan: Return in about 2 weeks (around 10/05/2017) for Spiro, sore throat f/u.

## 2017-09-22 ENCOUNTER — Telehealth: Payer: Self-pay | Admitting: Family Medicine

## 2017-09-22 MED ORDER — SUCRALFATE 1 G PO TABS: 1.0000 g | ORAL_TABLET | Freq: Three times a day (TID) | ORAL | 1 refills | Status: DC

## 2017-09-22 NOTE — Telephone Encounter (Signed)
Routing to provider  

## 2017-09-22 NOTE — Telephone Encounter (Signed)
Copied from Gerty [id number]. Topic: Quick Communication - See Telephone Encounter >> Sep 22, 2017  9:41 AM Ether Griffins B wrote: CRM for notification. See Telephone encounter for:  Pt calling in checking on a fax sucralfate switch from the liquid form to pill form due to insurance not covering the liquid and would cost pt $200. The pill form that the pharmacists told the pt could be dissolved in water and taken just like the liquid would cost him about $24.  09/22/17.

## 2017-09-22 NOTE — Telephone Encounter (Signed)
RX changed 

## 2017-09-23 ENCOUNTER — Encounter: Payer: Self-pay | Admitting: Physical Therapy

## 2017-09-23 ENCOUNTER — Ambulatory Visit: Payer: BLUE CROSS/BLUE SHIELD | Admitting: Physical Therapy

## 2017-09-23 DIAGNOSIS — M6281 Muscle weakness (generalized): Secondary | ICD-10-CM

## 2017-09-23 DIAGNOSIS — R600 Localized edema: Secondary | ICD-10-CM

## 2017-09-23 DIAGNOSIS — M25612 Stiffness of left shoulder, not elsewhere classified: Secondary | ICD-10-CM

## 2017-09-23 DIAGNOSIS — M25512 Pain in left shoulder: Secondary | ICD-10-CM

## 2017-09-23 NOTE — Therapy (Signed)
[place], [name], [address] Phone: [phone]   Fax:  [phone]  Physical Therapy Treatment  Patient Details  Name: Keith Barber MRN: [id number] Date of Birth: Jul 10, 1971 Referring Provider: Joni Fears, MD    Encounter Date: 09/23/2017  PT End of Session - 09/23/17 0850    Visit Number  13    Number of Visits  23    Date for PT Re-Evaluation  11/01/17    PT Start Time  0845    PT Stop Time  0942    PT Time Calculation (min)  57 min       Past Medical History:  Diagnosis Date  . Bone spur    LEFT SHOULDER THAT MAKES HIS LEFT ARM TINGLE  . GERD (gastroesophageal reflux disease)   . Hyperlipidemia   . Hypertension   . Migraines    rare migraines    Past Surgical History:  Procedure Laterality Date  . INGUINAL HERNIA REPAIR Bilateral 01/21/2017   Procedure: LAPAROSCOPIC BILATERAL INGUINAL HERNIA REPAIR;  Surgeon: Jules Husbands, MD;  Location: [facility];  Service: General;  Laterality: Bilateral;  . KNEE SURGERY Left   . SHOULDER ARTHROSCOPY WITH DISTAL CLAVICLE RESECTION Left 07/05/2017   Procedure: LEFT SHOULDER ARTHROSCOPY, SUBACROMIAL DECOMPRESSION, DISTAL CLAVICLE RESECTION, POSSIBLE MINI OPEN ROTATOR CUFF REPAIR;  Surgeon: [name], MD;  Location: [place];  Service: Orthopedics;  Laterality: Left;  . SHOULDER SURGERY Right   . UMBILICAL HERNIA REPAIR N/A 01/21/2017   Procedure: HERNIA REPAIR UMBILICAL ADULT;  Surgeon: Jules Husbands, MD;  Location: [facility];  Service: General;  Laterality: N/A;    There were no vitals filed for this visit.  Subjective Assessment - 09/23/17 0848    Subjective  I had some trouble sleeping last night. On new meds for throat pain and cough. I think they are affecting my sleep. I havent been able to do much.    Currently in Pain?  Yes    Pain Score  2     Pain Location  Shoulder    Pain Orientation  Left         [facility] PT Assessment - 09/23/17  0001      ROM / Strength   AROM / PROM / Strength  Strength      Strength   Strength Assessment Site  Hand    Right/Left hand  Right;Left    Right Hand Grip (lbs)  -- 98,98, 102    Left Hand Grip (lbs)  -- 55, 48, 55                  [facility] Adult PT Treatment/Exercise - 09/23/17 0001      Shoulder Exercises: Supine   Other Supine Exercises  Supine chest press with wooden dowel, ER x 10 each       Shoulder Exercises: Standing   External Rotation  Strengthening;Left;10 reps;Theraband    Theraband Level (Shoulder External Rotation)  Level 2 (Red)    Extension  Strengthening;Both;10 reps;Theraband    Theraband Level (Shoulder Extension)  Level 2 (Red)    Row  Strengthening;Both;15 reps;Theraband    Theraband Level (Shoulder Row)  Level 2 (Red)    Other Standing Exercises  Standing  cane flexion, abduction-has low back pain- cues to stand up straight/engage abdominals.       Shoulder Exercises: Pulleys   Flexion  2 minutes    ABduction  2 minutes      Shoulder Exercises:  ROM/Strengthening   Other ROM/Strengthening Exercises  grip strength with digi flex 5 sec x 10 , pronation/supineation with wooden handle 1#, wrist extension 1# eccentric control       Manual Therapy   Manual Therapy  Soft tissue mobilization    Soft tissue mobilization  wrist extensorts IASTM     Passive ROM  PROM, flexion, abduction, ER              PT Education - 09/23/17 0949    Education provided  Yes    Education Details  Supine [employer]) Educated  Patient    Methods  Explanation;Handout    Comprehension  Verbalized understanding       PT Short Term Goals - 09/16/17 1148      PT SHORT TERM GOAL #1   Title  Patient will be I with initial HEP for Lt UE ROM and strength     Status  Achieved      PT SHORT TERM GOAL #2   Title  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 UE      Status  Achieved        PT Long Term Goals - 09/16/17 1149      PT LONG TERM GOAL #1   Title  Pt will be I with more advanced HEP for L UE     Status  On-going      PT LONG TERM GOAL #2   Title  Pt will score FOTO less than 40% to demo improved functional mobilty.     Status  On-going      PT LONG TERM GOAL #3   Title  Pt will be able to raise arm >130 deg for improved overhead reaching anf household tasks.     Status  On-going      PT LONG TERM GOAL #4   Title  Pt will demo strength in L UE in 4+/5 throughout for safety with lifting, maximizing functional use in and out of the home.     Status  On-going      PT LONG TERM GOAL #5   Title  Pt will report no difficulty with grooming and ADLs, to include reaching to lower back and behind head.     Status  On-going            Plan - 09/23/17 7564    Clinical Impression Statement  Grip strength approximately half strength of unaffected UE. He c/o forearm pain with pulleys. IASTM used to decrease tenderness in forearm. Performed eccentric wrist strengthening with 1# and grip strength. Pain ratings are 1-2/10 however he demonstrates pain behaviors that indicate higher levels of pain. Lateral mid upper arm pain also limiting progress. May benefot from IASTM to this area. Added supine cane to HEP. He is unable to reach into IR. Er limite to 20-25 degrees with increased pain.     PT Next Visit Plan  Progress AA as able.  Manual for ROM; check compliance with HEP;   Return  to progressing with protocol  for both labral and rotator cuff repairs.  IASTM TO lateral upper arm, how is the forearm? continue wrist and elbow strength     PT Home Exercise Plan  AAROM table slides and pendulums,  ER cane, isometrics, scapular retraction, ext, row and  ER with red band, supine cane chest press and AA ER.     Consulted and Agree with Plan of Care  Patient       Patient will benefit from skilled therapeutic intervention in order to improve the following deficits and impairments:  Decreased mobility, Impaired sensation, Improper body mechanics, Increased edema, Decreased range of motion, Decreased activity tolerance, Decreased strength, Increased fascial restricitons, Impaired flexibility, Impaired UE functional use, Postural dysfunction, Pain  Visit Diagnosis: Acute pain of left shoulder  Localized edema  Stiffness of left shoulder, not elsewhere classified  Muscle weakness (generalized)     Problem List Patient Active Problem List   Diagnosis Date Noted  . Impingement syndrome of left shoulder 07/05/2017  . 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  . Umbilical hernia without obstruction and without gangrene   . Bilateral inguinal hernia without obstruction or gangrene   . History of tobacco abuse 11/26/2016  . Hypertension   . Hyperlipidemia   . Migraines   . Rotator cuff tendinitis, left 11/12/2015  . Labral tear of shoulder, left, initial encounter 11/12/2015    Dorene Ar, PTA 09/23/2017, 10:00 AM  [place], [name], [address] Phone: [phone]   Fax:  [phone]  Name: Keith Barber MRN: [id number] Date of Birth: 11/03/1970

## 2017-09-23 NOTE — Patient Instructions (Signed)
Follow up in several weeks

## 2017-09-23 NOTE — Patient Instructions (Signed)
SHOULDER: External Rotation - Supine (Cane)   Hold cane with both hands. Rotate arm away from body. Keep elbow on floor and next to body. __10_ reps per set, 2___ sets per day, _7__ days per week Add towel to keep elbow at side.   Copyright  VHI. All rights reserved.  Cane Exercise: Flexion   Lie on back, holding cane above chest. Keeping arms as straight as possible, lower cane toward floor beyond head. Hold ___5_ seconds. Repeat _20___ times. Do __2__ sessions per day.  [email or web address].exer.[email or web address]   Copyright  VHI. All rights reserved.

## 2017-09-27 ENCOUNTER — Encounter: Payer: Self-pay | Admitting: Physical Therapy

## 2017-09-27 ENCOUNTER — Ambulatory Visit: Payer: BLUE CROSS/BLUE SHIELD | Admitting: Physical Therapy

## 2017-09-27 DIAGNOSIS — M6281 Muscle weakness (generalized): Secondary | ICD-10-CM

## 2017-09-27 DIAGNOSIS — M25512 Pain in left shoulder: Secondary | ICD-10-CM | POA: Diagnosis not present

## 2017-09-27 DIAGNOSIS — R600 Localized edema: Secondary | ICD-10-CM

## 2017-09-27 DIAGNOSIS — M25612 Stiffness of left shoulder, not elsewhere classified: Secondary | ICD-10-CM

## 2017-09-27 NOTE — Therapy (Signed)
[place], [name], [address] Phone: [phone]   Fax:  [phone]  Physical Therapy Treatment  Patient Details  Name: Keith Barber MRN: [id number] Date of Birth: [birth year] Referring Provider: Joni Fears, MD    Encounter Date: 09/27/2017  PT End of Session - 09/27/17 1550    Visit Number  14    Number of Visits  23    Date for PT Re-Evaluation  11/01/17    PT Start Time  0300    PT Stop Time  0345    PT Time Calculation (min)  45 min       Past Medical History:  Diagnosis Date  . Bone spur    LEFT SHOULDER THAT MAKES HIS LEFT ARM TINGLE  . GERD (gastroesophageal reflux disease)   . Hyperlipidemia   . Hypertension   . Migraines    rare migraines    Past Surgical History:  Procedure Laterality Date  . INGUINAL HERNIA REPAIR Bilateral 01/21/2017   Procedure: LAPAROSCOPIC BILATERAL INGUINAL HERNIA REPAIR;  Surgeon: Jules Husbands, MD;  Location: [facility];  Service: General;  Laterality: Bilateral;  . KNEE SURGERY Left   . SHOULDER ARTHROSCOPY WITH DISTAL CLAVICLE RESECTION Left 07/05/2017   Procedure: LEFT SHOULDER ARTHROSCOPY, SUBACROMIAL DECOMPRESSION, DISTAL CLAVICLE RESECTION, POSSIBLE MINI OPEN ROTATOR CUFF REPAIR;  Surgeon: [name], MD;  Location: [facility];  Service: Orthopedics;  Laterality: Left;  . SHOULDER SURGERY Right   . UMBILICAL HERNIA REPAIR N/A 01/21/2017   Procedure: HERNIA REPAIR UMBILICAL ADULT;  Surgeon: Jules Husbands, MD;  Location: [facility];  Service: General;  Laterality: N/A;    There were no vitals filed for this visit.  Subjective Assessment - 09/27/17 1504    Subjective  Not much a pain.     Currently in Pain?  Yes    Pain Score  1     Pain Location  Shoulder    Pain Orientation  Left    Pain Descriptors / Indicators  Aching;Sore                      [facility] Adult PT Treatment/Exercise - 09/27/17 0001      Shoulder Exercises: Supine    Protraction  15 reps 3# on dowel    Other Supine Exercises  horizontal abduction red band     Other Supine Exercises  Supine chest press with wooden dowel and 3# , pullovers 3# x 15, ER x 10 each       Shoulder Exercises: Sidelying   External Rotation  10 reps    External Rotation Weight (lbs)  2      Shoulder Exercises: Standing   External Rotation  Strengthening;Left;10 reps;Theraband    Theraband Level (Shoulder External Rotation)  Level 2 (Red)    Extension  Strengthening;Both;15 reps;Theraband    Theraband Level (Shoulder Extension)  Level 2 (Red)    Row  Strengthening;Both;15 reps;Theraband    Theraband Level (Shoulder Row)  Level 2 (Red)    Other Standing Exercises  cabinet reaching bilateral UE middle shelf-c/o pain into elbow       Shoulder Exercises: Pulleys   Flexion  2 minutes    ABduction  2 minutes      Shoulder Exercises: ROM/Strengthening   UBE (Upper Arm Bike)  5 min L1 using both UEs     Other ROM/Strengthening Exercises  wrist extension 2# x 20, pronation/supineation wooden dowel 2# x 20, bicep curls 3#, digi  grip for grip strength       Manual Therapy   Passive ROM  PROM ER                PT Short Term Goals - 09/16/17 1148      PT SHORT TERM GOAL #1   Title  Patient will be I with initial HEP for Lt UE ROM and strength     Status  Achieved      PT SHORT TERM GOAL #2   Title  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 UE      Status  Achieved        PT Long Term Goals - 09/16/17 1149      PT LONG TERM GOAL #1   Title  Pt will be I with more advanced HEP for L UE     Status  On-going      PT LONG TERM GOAL #2   Title  Pt will score FOTO less than  40% to demo improved functional mobilty.     Status  On-going      PT LONG TERM GOAL #3   Title  Pt will be able to raise arm >130 deg for improved overhead reaching anf household tasks.     Status  On-going      PT LONG TERM GOAL #4   Title  Pt will demo strength in L UE in 4+/5 throughout for safety with lifting, maximizing functional use in and out of the home.     Status  On-going      PT LONG TERM GOAL #5   Title  Pt will report no difficulty with grooming and ADLs, to include reaching to lower back and behind head.     Status  On-going            Plan - 09/27/17 1504    Clinical Impression Statement  Able to sleep a couple hours on left side. He is able to use LUE for turning steering wheel if he does it slowly. He continues to c/o elbow, forearm and lateral upper arm pain with therex. Areas of spasm noted in forearm, deltoid, triceps. Continues to be limited in ER rom.     PT Next Visit Plan  Progress AA as able.  Manual for ROM; check compliance with HEP;   Return to progressing with protocol  for both labral and rotator cuff repairs.  IASTM TO lateral upper arm, how is the forearm? continue wrist and elbow strength     PT Home Exercise Plan  AAROM table slides and pendulums,  ER cane, isometrics, scapular retraction, ext, row and ER with red band, supine cane chest press and AA ER.     Consulted and Agree with Plan of Care  Patient       Patient will benefit from skilled therapeutic intervention in order to improve the following deficits and impairments:  Decreased mobility, Impaired sensation, Improper body mechanics, Increased edema, Decreased range of motion, Decreased activity tolerance, Decreased strength, Increased fascial restricitons,  Impaired flexibility, Impaired UE functional use, Postural dysfunction, Pain  Visit Diagnosis: Acute pain of left shoulder  Localized edema  Stiffness of left shoulder, not elsewhere classified  Muscle weakness  (generalized)     Problem List Patient Active Problem List   Diagnosis Date Noted  . Impingement syndrome of left shoulder 07/05/2017  . 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  . Umbilical hernia without obstruction and without gangrene   . Bilateral inguinal hernia without obstruction or gangrene   . History of tobacco abuse 11/26/2016  . Hypertension   . Hyperlipidemia   . Migraines   . Rotator cuff tendinitis, left 11/12/2015  . Labral tear of shoulder, left, initial encounter 11/12/2015    Dorene Ar, PTA 09/27/2017, 4:11 PM  [facility] [address] [place], [name], [address] Phone: [phone]   Fax:  [phone]  Name: Keith Barber MRN: [id number] Date of Birth: 04-Mar-1971

## 2017-09-29 ENCOUNTER — Encounter: Payer: Self-pay | Admitting: Physical Therapy

## 2017-09-29 ENCOUNTER — Ambulatory Visit: Payer: BLUE CROSS/BLUE SHIELD | Admitting: Physical Therapy

## 2017-09-29 DIAGNOSIS — R600 Localized edema: Secondary | ICD-10-CM

## 2017-09-29 DIAGNOSIS — M6281 Muscle weakness (generalized): Secondary | ICD-10-CM

## 2017-09-29 DIAGNOSIS — M25512 Pain in left shoulder: Secondary | ICD-10-CM | POA: Diagnosis not present

## 2017-09-29 DIAGNOSIS — M25612 Stiffness of left shoulder, not elsewhere classified: Secondary | ICD-10-CM

## 2017-09-29 NOTE — Therapy (Signed)
[place], [name], [address] Phone: [phone]   Fax:  [phone]  Physical Therapy Treatment  Patient Details  Name: Keith Barber MRN: [id number] Date of Birth: [birth year] Referring Provider: Joni Fears, MD    Encounter Date: 09/29/2017  PT End of Session - 09/29/17 1213    Visit Number  15    Number of Visits  23    Date for PT Re-Evaluation  11/01/17    PT Start Time  0933    PT Stop Time  1015    PT Time Calculation (min)  42 min    Activity Tolerance  Patient tolerated treatment well    Behavior During Therapy  [facility] for tasks assessed/performed       Past Medical History:  Diagnosis Date  . Bone spur    LEFT SHOULDER THAT MAKES HIS LEFT ARM TINGLE  . GERD (gastroesophageal reflux disease)   . Hyperlipidemia   . Hypertension   . Migraines    rare migraines    Past Surgical History:  Procedure Laterality Date  . INGUINAL HERNIA REPAIR Bilateral 01/21/2017   Procedure: LAPAROSCOPIC BILATERAL INGUINAL HERNIA REPAIR;  Surgeon: Jules Husbands, MD;  Location: [facility];  Service: General;  Laterality: Bilateral;  . KNEE SURGERY Left   . SHOULDER ARTHROSCOPY WITH DISTAL CLAVICLE RESECTION Left 07/05/2017   Procedure: LEFT SHOULDER ARTHROSCOPY, SUBACROMIAL DECOMPRESSION, DISTAL CLAVICLE RESECTION, POSSIBLE MINI OPEN ROTATOR CUFF REPAIR;  Surgeon: [name], MD;  Location: [facility];  Service: Orthopedics;  Laterality: Left;  . SHOULDER SURGERY Right   . UMBILICAL HERNIA REPAIR N/A 01/21/2017   Procedure: HERNIA REPAIR UMBILICAL ADULT;  Surgeon: Jules Husbands, MD;  Location: [facility];  Service: General;  Laterality: N/A;    There were no vitals filed for this visit.  Subjective Assessment - 09/29/17 1206    Subjective  I get better motion as I stretch.  I have trouble with this motion.  ER  It just does not seem to get better.  I have not needed ice for pain lately.    Currently in Pain?   Yes    Pain Score  2     Pain Location  Shoulder    Pain Orientation  Left    Pain Descriptors / Indicators  Aching;Tightness;Sore    Pain Type  Surgical pain    Pain Radiating Towards  deltiod ,  triceps    Pain Frequency  Intermittent    Aggravating Factors   reaching different ways,  rolling onto shoulder    Pain Relieving Factors  aleve rest,  change of position.                      [facility] Adult PT Treatment/Exercise - 09/29/17 0001      Shoulder Exercises: Supine   Protraction  5 reps 3 lbs on dowel,  stopped due to pain    Other Supine Exercises  isometric 10 x 5 seconds 5 way  mild discomfort      Shoulder Exercises: Pulleys   Flexion  -- 4 minutes      Shoulder Exercises: ROM/Strengthening   UBE (Upper Arm Bike)    6 minutes  total increase level at atient's request to increas work forearm fatigue      Manual Therapy   Manual Therapy  Soft tissue mobilization;Taping    Passive ROM  PROM not tolerated    Kinesiotex  Inhibit Muscle;Ligament Correction  Kinesiotix   Inhibit Muscle   teres,  deltiod,     Ligament Correction  posture correction             PT Education - 09/29/17 1213    Education provided  Yes    Education Details  how tape works    [employer]) Educated  Patient    Methods  Verbal cues    Comprehension  Verbalized understanding       PT Short Term Goals - 09/16/17 1148      PT SHORT TERM GOAL #1   Title  Patient will be I with initial HEP for Lt UE ROM and strength     Status  Achieved      PT SHORT TERM GOAL #2   Title  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 UE      Status  Achieved        PT Long Term Goals - 09/16/17  1149      PT LONG TERM GOAL #1   Title  Pt will be I with more advanced HEP for L UE     Status  On-going      PT LONG TERM GOAL #2   Title  Pt will score FOTO less than 40% to demo improved functional mobilty.     Status  On-going      PT LONG TERM GOAL #3   Title  Pt will be able to raise arm >130 deg for improved overhead reaching anf household tasks.     Status  On-going      PT LONG TERM GOAL #4   Title  Pt will demo strength in L UE in 4+/5 throughout for safety with lifting, maximizing functional use in and out of the home.     Status  On-going      PT LONG TERM GOAL #5   Title  Pt will report no difficulty with grooming and ADLs, to include reaching to lower back and behind head.     Status  On-going            Plan - 09/29/17 1214    Clinical Impression Statement  Patient continues to have limited ROM .  pain into arm and elbow continue with movement.  Teres continues to be super tender to palpation.  No increased pain noted at end of session.  ER neutral     PT Next Visit Plan  Progress AA as able.  Manual for ROM; check compliance with HEP;   Return to progressing with protocol  for both labral and rotator cuff repairs.  IASTM TO lateral upper arm, how is the forearm? continue wrist and elbow strength     PT Home Exercise Plan  AAROM table slides and pendulums,  ER cane, isometrics, scapular retraction, ext, row and ER with red band, supine cane chest press and AA ER.     Consulted and Agree with Plan of Care  Patient       Patient will benefit from skilled therapeutic intervention in  order to improve the following deficits and impairments:     Visit Diagnosis: Acute pain of left shoulder  Localized edema  Stiffness of left shoulder, not elsewhere classified  Muscle weakness (generalized)     Problem List Patient Active Problem List   Diagnosis Date Noted  . Impingement syndrome of left shoulder 07/05/2017  . 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/2017  . Umbilical hernia without obstruction and without gangrene   . Bilateral inguinal hernia without obstruction or gangrene   . History of tobacco abuse 11/26/2016  . Hypertension   . Hyperlipidemia   . Migraines   . Rotator cuff tendinitis, left 11/12/2015  . Labral tear of shoulder, left, initial encounter 11/12/2015    [facility]  PTA 09/29/2017, 12:17 PM  [place], [name], [address] Phone: [phone]   Fax:  [phone]  Name: Keith Barber MRN: [id number] Date of Birth: 1970-12-01

## 2017-10-04 ENCOUNTER — Encounter: Payer: Self-pay | Admitting: Physical Therapy

## 2017-10-04 ENCOUNTER — Ambulatory Visit: Payer: BLUE CROSS/BLUE SHIELD | Admitting: Physical Therapy

## 2017-10-04 DIAGNOSIS — M25512 Pain in left shoulder: Secondary | ICD-10-CM | POA: Diagnosis not present

## 2017-10-04 DIAGNOSIS — M6281 Muscle weakness (generalized): Secondary | ICD-10-CM

## 2017-10-04 DIAGNOSIS — M25612 Stiffness of left shoulder, not elsewhere classified: Secondary | ICD-10-CM

## 2017-10-04 DIAGNOSIS — R600 Localized edema: Secondary | ICD-10-CM

## 2017-10-04 NOTE — Therapy (Signed)
[place], [name], [address] Phone: [phone]   Fax:  [phone]  Physical Therapy Treatment  Patient Details  Name: Keith Barber MRN: [id number] Date of Birth: [birth year] Referring Provider: Joni Fears, MD    Encounter Date: 10/04/2017  PT End of Session - 10/04/17 1719    Visit Number  16    Number of Visits  23    Date for PT Re-Evaluation  11/01/17    PT Start Time  1332    PT Stop Time  1416    PT Time Calculation (min)  44 min    Activity Tolerance  Patient tolerated treatment well;Patient limited by pain    Behavior During Therapy  [facility] for tasks assessed/performed       Past Medical History:  Diagnosis Date  . Bone spur    LEFT SHOULDER THAT MAKES HIS LEFT ARM TINGLE  . GERD (gastroesophageal reflux disease)   . Hyperlipidemia   . Hypertension   . Migraines    rare migraines    Past Surgical History:  Procedure Laterality Date  . INGUINAL HERNIA REPAIR Bilateral 01/21/2017   Procedure: LAPAROSCOPIC BILATERAL INGUINAL HERNIA REPAIR;  Surgeon: Jules Husbands, MD;  Location: [facility];  Service: General;  Laterality: Bilateral;  . KNEE SURGERY Left   . SHOULDER ARTHROSCOPY WITH DISTAL CLAVICLE RESECTION Left 07/05/2017   Procedure: LEFT SHOULDER ARTHROSCOPY, SUBACROMIAL DECOMPRESSION, DISTAL CLAVICLE RESECTION, POSSIBLE MINI OPEN ROTATOR CUFF REPAIR;  Surgeon: [name], MD;  Location: [facility];  Service: Orthopedics;  Laterality: Left;  . SHOULDER SURGERY Right   . UMBILICAL HERNIA REPAIR N/A 01/21/2017   Procedure: HERNIA REPAIR UMBILICAL ADULT;  Surgeon: Jules Husbands, MD;  Location: [facility];  Service: General;  Laterality: N/A;    There were no vitals filed for this visit.  Subjective Assessment - 10/04/17 1335    Subjective  No pain just discomfort    Currently in Pain?  No/denies    Pain Location  Shoulder    Pain Descriptors / Indicators  Discomfort    Aggravating  Factors   it depends on how I sit    Pain Relieving Factors  change of position    Multiple Pain Sites  No                      [facility] Adult PT Treatment/Exercise - 10/04/17 0001      Shoulder Exercises: Seated   Other Seated Exercises    Wrist weight 4 lbs  3 way  10 x each  eccentric focus forearm resting on thigh also supination /pronation       Shoulder Exercises: Standing   External Rotation  Strengthening    Theraband Level (Shoulder External Rotation)  Level 2 (Red) 10 X  5 second hold    Extension  Strengthening    Theraband Level (Shoulder Extension)  Level 2 (Red) 5 second hold    Row  Strengthening    Theraband Level (Shoulder Row)  Level 2 (Red);Level 3 (Green) pain with release of row with green band so back to red    Other Standing Exercises  cabinet reach 1st 2 nds shelf pain free,  overhead 2/10 pain    Other Standing Exercises  counter walking weightbearing 1 minute      Shoulder Exercises: Pulleys   Flexion  2 minutes    ABduction  -- painful      Shoulder Exercises: ROM/Strengthening   UBE (  Upper Arm Bike)  6 minutes total level 4 3 minutes each way             PT Education - 10/04/17 1719    Education provided  Yes    Education Details  keep exercises pain free    Person(s) Educated  Patient    Methods  Explanation    Comprehension  Verbalized understanding       PT Short Term Goals - 10/04/17 1732      PT SHORT TERM GOAL #1   Title  Patient will be I with initial HEP for Lt UE ROM and strength     Time  4    Period  Weeks    Status  Achieved      PT SHORT TERM GOAL #2   Title  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 UE      Time  4    Period  Weeks    Status  Achieved        PT Long Term Goals - 10/04/17 1732      PT LONG TERM GOAL #1   Title  Pt will be I with more advanced HEP for L UE     Baseline  independent with exercises so far    Time  8    Period  Weeks    Status  On-going      PT LONG TERM GOAL #2   Title  Pt will score FOTO less than 40% to demo improved functional mobilty.     Time  8    Period  Weeks    Status  Unable to assess      PT LONG TERM GOAL #3   Title  Pt will be able to raise arm >130 deg for improved overhead reaching anf household tasks.     Time  8    Period  Weeks    Status  Unable to assess      PT LONG TERM GOAL #4   Title  Pt will demo strength in L UE in 4+/5 throughout for safety with lifting, maximizing functional use in and out of the home.     Time  8    Period  Weeks    Status  Unable to assess      PT LONG TERM GOAL #5   Title  Pt will report no difficulty with grooming and ADLs, to include reaching to lower back and behind head.     Baseline  able to get behind head after stretching.  could not do yesterday,  reaches hip just posterior greater trochanter    Time  8    Period  Weeks    Status  On-going            Plan - 10/04/17 1720  Clinical Impression Statement  Patient's arm shakes with fatigued during light strengthening exercises and with making a simple fist squeeze.  There was pain with exercise shoulder with return from a row.   (Atypical) Peck tender with palpation  may benifit from manual  He is able to do the exercises at work and relaxes when he is at home.      PT Next Visit Plan  Progress AA as able.  Manual for ROM;;   Return as able protocol  for both labral and rotator cuff repairs.  continue wrist and elbow strength consider rythmic stabilization 60 degrees and prone row, PRE,  ER in sidelying   PRE flexion    PT Home  Exercise Plan  AAROM table slides and pendulums,  ER cane, isometrics, scapular retraction, ext, row and ER with red band, supine cane chest press and AA ER. Manual to pecs    Consulted and Agree with Plan of Care  Patient       Patient will benefit from skilled therapeutic intervention in order to improve the following deficits and impairments:     Visit Diagnosis: Acute pain of left shoulder  Localized edema  Stiffness of left shoulder, not elsewhere classified  Muscle weakness (generalized)     Problem List Patient Active Problem List   Diagnosis Date Noted  . Impingement syndrome of left shoulder 07/05/2017  . 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  . Umbilical hernia without obstruction and without gangrene   . Bilateral inguinal hernia without obstruction or gangrene   . History of tobacco abuse 11/26/2016  . Hypertension   . Hyperlipidemia   . Migraines   . Rotator cuff tendinitis, left 11/12/2015  . Labral tear of shoulder, left, initial encounter 11/12/2015    [name] PTA 10/04/2017, 5:35 PM  [facility] [address] [employer] [place], [name], [address] Phone: [phone]   Fax:  [phone]  Name: Ihor Meinzer MRN: [id number] Date of Birth: 03-15-1971

## 2017-10-04 NOTE — Patient Instructions (Signed)
Keep exercises pain free

## 2017-10-05 ENCOUNTER — Telehealth: Payer: Self-pay | Admitting: Family Medicine

## 2017-10-05 NOTE — Telephone Encounter (Signed)
Spoke with Apolonio Schneiders. She wanted patient to F/U once medication was completed. Patient stated Apolonio Schneiders mentioned something about 2 weeks and after he fininshed his medication but patient hasn't finished it yet. Patient said he was going to call back a schedule an appointment. He had other appointments going around at the same time.  Routing to provider as Juluis Rainier.

## 2017-10-05 NOTE — Telephone Encounter (Signed)
Copied from [place]. Topic: Quick Communication - See Telephone Encounter >> Oct 05, 2017 10:34 AM Cleaster Corin, NT wrote: CRM for notification. See Telephone encounter for:   10/05/17. Pt. Calling to see if he needs to make a follow up appt. After his inhaler goes out or before he stated that he has 64 more clicks left. But was told to follow up in two weeks when the inhaler would be done. Pt. Was given inhaler in office. Pt. Would like nurse for PA to give him a call back to let him know about appt.

## 2017-10-06 ENCOUNTER — Encounter: Payer: Self-pay | Admitting: Physical Therapy

## 2017-10-06 ENCOUNTER — Ambulatory Visit: Payer: BLUE CROSS/BLUE SHIELD | Admitting: Physical Therapy

## 2017-10-06 DIAGNOSIS — M25512 Pain in left shoulder: Secondary | ICD-10-CM | POA: Diagnosis not present

## 2017-10-06 DIAGNOSIS — M6281 Muscle weakness (generalized): Secondary | ICD-10-CM

## 2017-10-06 DIAGNOSIS — R600 Localized edema: Secondary | ICD-10-CM

## 2017-10-06 DIAGNOSIS — M25612 Stiffness of left shoulder, not elsewhere classified: Secondary | ICD-10-CM

## 2017-10-06 NOTE — Therapy (Signed)
[place], [name], [address] Phone: [phone]   Fax:  [phone]  Physical Therapy Treatment  Patient Details  Name: Keith Barber MRN: [id number] Date of Birth: October 05, 1970 Referring Provider: Joni Fears, MD    Encounter Date: 10/06/2017  PT End of Session - 10/06/17 1426    Visit Number  17    Number of Visits  23    Date for PT Re-Evaluation  11/01/17    PT Start Time  1332    PT Stop Time  1430    PT Time Calculation (min)  58 min    Activity Tolerance  Patient tolerated treatment well;Patient limited by pain    Behavior During Therapy  [facility] for tasks assessed/performed       Past Medical History:  Diagnosis Date  . Bone spur    LEFT SHOULDER THAT MAKES HIS LEFT ARM TINGLE  . GERD (gastroesophageal reflux disease)   . Hyperlipidemia   . Hypertension   . Migraines    rare migraines    Past Surgical History:  Procedure Laterality Date  . INGUINAL HERNIA REPAIR Bilateral 01/21/2017   Procedure: LAPAROSCOPIC BILATERAL INGUINAL HERNIA REPAIR;  Surgeon: Jules Husbands, MD;  Location: [facility];  Service: General;  Laterality: Bilateral;  . KNEE SURGERY Left   . SHOULDER ARTHROSCOPY WITH DISTAL CLAVICLE RESECTION Left 07/05/2017   Procedure: LEFT SHOULDER ARTHROSCOPY, SUBACROMIAL DECOMPRESSION, DISTAL CLAVICLE RESECTION, POSSIBLE MINI OPEN ROTATOR CUFF REPAIR;  Surgeon: [name], MD;  Location: [facility];  Service: Orthopedics;  Laterality: Left;  . SHOULDER SURGERY Right   . UMBILICAL HERNIA REPAIR N/A 01/21/2017   Procedure: HERNIA REPAIR UMBILICAL ADULT;  Surgeon: Jules Husbands, MD;  Location: [facility];  Service: General;  Laterality: N/A;    There were no vitals filed for this visit.  Subjective Assessment - 10/06/17 1421    Subjective  Forearm sore from workout.  i over did it.   heat helped.      Currently in Pain?  Yes    Pain Score  3     Pain Location  Shoulder    Pain  Orientation  Left    Pain Descriptors / Indicators  Discomfort;Sore    Pain Radiating Towards  deltoid,  triceps    Pain Frequency  Intermittent    Aggravating Factors   waking up without pillow,  how I use it,  overdoing    Pain Relieving Factors  change of position,  Numbing gel medication.     Effect of Pain on Daily Activities  limited    Multiple Pain Sites  No         [facility] PT Assessment - 10/06/17 0001      AROM   Left Shoulder Flexion  115 Degrees    Left Shoulder ABduction  113 Degrees                  [facility] Adult PT Treatment/Exercise - 10/06/17 0001      Shoulder Exercises: Sidelying   External Rotation  AAROM painful    Flexion  10 reps with UE Ranger      Shoulder Exercises: Pulleys   Flexion  2 minutes    ABduction  -- 3 reps,  painful   noted increased range.        Shoulder Exercises: ROM/Strengthening   UBE (Upper Arm Bike)  6 minutes total from L1 to 2.5 the last minute and a half.  Cryotherapy   Number Minutes Cryotherapy  10 Minutes    Cryotherapy Location  Shoulder    Type of Cryotherapy  -- cold pack      Manual Therapy   Manual Therapy  Soft tissue mobilization    Soft tissue mobilization  scapular mobs,  deltoid triggrt point and upper trap trigger point release.  tissue softened.      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 UE      Time  4    Period  Weeks    Status  Achieved        PT Long Term Goals - 10/04/17 1732      PT LONG TERM GOAL #1   Title  Pt will be I with more advanced HEP for L UE     Baseline  independent with exercises so far    Time  8    Period  Weeks    Status  On-going      PT LONG TERM GOAL #2   Title  Pt will score FOTO less than 40% to demo improved functional mobilty.     Time  8    Period  Weeks    Status  Unable to assess      PT LONG TERM GOAL #3   Title  Pt will be able to raise arm >130 deg for improved overhead reaching anf household tasks.     Time  8    Period  Weeks    Status  Unable to assess      PT LONG TERM GOAL #4   Title  Pt will demo strength in L UE in 4+/5 throughout for safety with lifting, maximizing functional use in and out of the home.     Time  8    Period  Weeks    Status  Unable to assess      PT LONG TERM GOAL #5   Title  Pt will report no difficulty with grooming and ADLs, to include reaching to lower back and behind head.     Baseline  able to get behind head after stretching.  could not do yesterday,  reaches hip  just posterior greater trochanter    Time  8    Period  Weeks    Status  On-going            Plan - 10/06/17 1428    Clinical Impression Statement  Patient sore after last session and did not tolerate much exercise.  However his abduction AROM increased to 113,  Flexion 110 ( a little less than usual).  Muscle  Shoulder soreness seems to limit  exercise.   pain 3/10 at end of session.     PT Next Visit Plan  closed chain and AA as able.  tape if needed.  Manual as needed    PT Home Exercise Plan  AAROM table slides and pendulums,  ER cane, isometrics, scapular retraction, ext, row and ER with red band, supine cane chest press  and AA ER. Manual to pecs    Consulted and Agree with Plan of Care  Patient       Patient will benefit from skilled therapeutic intervention in order to improve the following deficits and impairments:     Visit Diagnosis: Acute pain of left shoulder  Localized edema  Stiffness of left shoulder, not elsewhere classified  Muscle weakness (generalized)     Problem List Patient Active Problem List   Diagnosis Date Noted  . Impingement syndrome of left shoulder 07/05/2017  . 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  . Umbilical hernia without obstruction and without gangrene   . Bilateral inguinal hernia without obstruction or gangrene   . History of tobacco abuse 11/26/2016  . Hypertension   . Hyperlipidemia   . Migraines   . Rotator cuff tendinitis, left 11/12/2015  . Labral tear of shoulder, left, initial encounter 11/12/2015    [facility] PTA 10/06/2017, 2:31 PM  [facility] [address] [place], [name], [address] Phone: [phone]   Fax:  [phone]  Name: Keith Barber MRN: [id number] Date of Birth: May 26, 1971

## 2017-10-11 ENCOUNTER — Ambulatory Visit: Payer: BLUE CROSS/BLUE SHIELD | Admitting: Physical Therapy

## 2017-10-11 ENCOUNTER — Encounter: Payer: Self-pay | Admitting: Physical Therapy

## 2017-10-11 DIAGNOSIS — R600 Localized edema: Secondary | ICD-10-CM

## 2017-10-11 DIAGNOSIS — M25512 Pain in left shoulder: Secondary | ICD-10-CM

## 2017-10-11 DIAGNOSIS — M25612 Stiffness of left shoulder, not elsewhere classified: Secondary | ICD-10-CM

## 2017-10-11 DIAGNOSIS — M6281 Muscle weakness (generalized): Secondary | ICD-10-CM

## 2017-10-11 NOTE — Therapy (Signed)
[place], [name], [address] Phone: [phone]   Fax:  [phone]  Physical Therapy Treatment  Patient Details  Name: Keith Barber MRN: [id number] Date of Birth: 01/16/1971 Referring Provider: Joni Fears, MD    Encounter Date: 10/11/2017  PT End of Session - 10/11/17 1446    Visit Number  18    Number of Visits  23    Date for PT Re-Evaluation  11/01/17    PT Start Time  4034    PT Stop Time  1415    PT Time Calculation (min)  42 min    Activity Tolerance  Patient tolerated treatment well    Behavior During Therapy  [facility] for tasks assessed/performed       Past Medical History:  Diagnosis Date  . Bone spur    LEFT SHOULDER THAT MAKES HIS LEFT ARM TINGLE  . GERD (gastroesophageal reflux disease)   . Hyperlipidemia   . Hypertension   . Migraines    rare migraines    Past Surgical History:  Procedure Laterality Date  . INGUINAL HERNIA REPAIR Bilateral 01/21/2017   Procedure: LAPAROSCOPIC BILATERAL INGUINAL HERNIA REPAIR;  Surgeon: Jules Husbands, MD;  Location: [facility];  Service: General;  Laterality: Bilateral;  . KNEE SURGERY Left   . SHOULDER ARTHROSCOPY WITH DISTAL CLAVICLE RESECTION Left 07/05/2017   Procedure: LEFT SHOULDER ARTHROSCOPY, SUBACROMIAL DECOMPRESSION, DISTAL CLAVICLE RESECTION, POSSIBLE MINI OPEN ROTATOR CUFF REPAIR;  Surgeon: [name], MD;  Location: [facility];  Service: Orthopedics;  Laterality: Left;  . SHOULDER SURGERY Right   . UMBILICAL HERNIA REPAIR N/A 01/21/2017   Procedure: HERNIA REPAIR UMBILICAL ADULT;  Surgeon: Jules Husbands, MD;  Location: [facility];  Service: General;  Laterality: N/A;    There were no vitals filed for this visit.  Subjective Assessment - 10/11/17 1344    Subjective  Forearm less sore.  Not sleeping well over the last few days,  cannot get comfortable.       Currently in Pain?  Yes    Pain Score  3     Pain Location  Shoulder    Pain  Orientation  Left    Pain Descriptors / Indicators  Pins and needles forearm stuck like a needle    Pain Type  Surgical pain    Pain Radiating Towards  bicep    Pain Frequency  Intermittent    Aggravating Factors   nighttime pain,  over doing it    Pain Relieving Factors  change of position,  Numbing gel ointment    Effect of Pain on Daily Activities  limits sleeping    Multiple Pain Sites  No         [facility] PT Assessment - 10/11/17 0001      PROM   PROM Assessment Site  -- 120 AA pulleys    Left Shoulder External Rotation  -- 40 degrees ER with 50 degrees abduction.  AA            No data recorded       [facility] Adult PT Treatment/Exercise - 10/11/17 0001      Shoulder Exercises: Supine   Protraction  10 reps;AAROM    External Rotation  10 reps;AAROM    Flexion  AAROM    ABduction  AAROM    Other Supine Exercises  AA ROM    Other Supine Exercises  rythmic stabilization  5 bouts of static hold challanged  Shoulder Exercises: Standing   Internal Rotation  AROM    Extension  AROM    Other Standing Exercises  wall press,  flexion,  horizontal abd/ add circles 10 X  f atigue  forearm burning noted      Shoulder Exercises: Pulleys   Flexion  5 minutes      Manual Therapy   Manual Therapy  Soft tissue mobilization    Soft tissue mobilization  teres tight initially peri scapular               PT Education - 10/11/17 1445    Education provided  Yes    Education Details  keep exercise pain ress,      Person(s) Educated  Patient    Methods  Explanation    Comprehension  Verbalized understanding       PT Short Term Goals - 10/11/17 1450      PT SHORT TERM GOAL #1   Title  Patient will be I with initial HEP for Lt UE ROM and strength     Time  4    Period  Weeks    Status  Achieved      PT SHORT TERM GOAL #2   Title  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 UE      Time  4    Period  Weeks    Status  Achieved        PT Long Term Goals - 10/11/17 1452      PT LONG TERM GOAL #1   Title  Pt will be I with more advanced HEP for L UE     Baseline  independent with exercises so far    Time  8    Period  Weeks    Status  On-going      PT LONG TERM GOAL #2   Title  Pt will score FOTO less than 40% to demo improved functional mobilty.     Time  8    Status  Unable to assess      PT LONG TERM GOAL #3   Title  Pt will be able to raise arm >130 deg for improved overhead reaching anf household tasks.     Baseline  120 AA    Time  8    Period  Weeks    Status  On-going      PT LONG TERM GOAL #4   Title  Pt will demo strength in L UE in 4+/5 throughout for safety with lifting, maximizing functional use in and out of the home.     Time  8    Period  Weeks    Status  Unable to assess      PT LONG TERM GOAL #5   Title  Pt  will report no difficulty with grooming and ADLs, to include reaching to lower back and behind head.     Baseline  these are difficult t do    Time  8    Period  Weeks    Status  On-going            Plan - 10/11/17 1447    Clinical Impression Statement  120 AA flexion on pulleys,  ER at 50 degrees abduction 40 degrees.  Patient has pain in forearm with wall exercises.  Patient is 14 weeks post op.  ROM limitee,  pain 3-4/10 at end of session    PT Next Visit Plan  closed chain and AA as able.  tape if needed.  Manual as needed.  progress ex as able in pain free range    PT Home Exercise Plan  AAROM table slides and pendulums,  ER cane, isometrics, scapular retraction,  ext, row and ER with red band, supine cane chest press and AA ER. Manual to pecs    Consulted and Agree with Plan of Care  Patient       Patient will benefit from skilled therapeutic intervention in order to improve the following deficits and impairments:     Visit Diagnosis: Acute pain of left shoulder  Localized edema  Stiffness of left shoulder, not elsewhere classified  Muscle weakness (generalized)     Problem List Patient Active Problem List   Diagnosis Date Noted  . Impingement syndrome of left shoulder 07/05/2017  . 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  . Umbilical hernia without obstruction and without gangrene   . Bilateral inguinal hernia without obstruction or gangrene   . History of tobacco abuse 11/26/2016  . Hypertension   . Hyperlipidemia   . Migraines   . Rotator cuff tendinitis, left 11/12/2015  . Labral tear of shoulder, left, initial encounter 11/12/2015    [facility]  PTA 10/11/2017, 2:54 PM  [facility] [address] [place], [name], [address] Phone: [phone]   Fax:  [phone]  Name: Keith Barber MRN: [id number] Date of Birth: [birth year]

## 2017-10-13 ENCOUNTER — Encounter: Payer: Self-pay | Admitting: Physical Therapy

## 2017-10-13 ENCOUNTER — Ambulatory Visit: Payer: BLUE CROSS/BLUE SHIELD | Admitting: Physical Therapy

## 2017-10-13 DIAGNOSIS — M25512 Pain in left shoulder: Secondary | ICD-10-CM | POA: Diagnosis not present

## 2017-10-13 DIAGNOSIS — M25612 Stiffness of left shoulder, not elsewhere classified: Secondary | ICD-10-CM

## 2017-10-13 DIAGNOSIS — M6281 Muscle weakness (generalized): Secondary | ICD-10-CM

## 2017-10-13 DIAGNOSIS — R600 Localized edema: Secondary | ICD-10-CM

## 2017-10-13 NOTE — Therapy (Signed)
[place], [name], [address] Phone: [phone]   Fax:  [phone]  Physical Therapy Treatment  Patient Details  Name: Keith Barber MRN: [id number] Date of Birth: 09-27-1970 Referring Provider: Joni Fears, MD    Encounter Date: 10/13/2017  PT End of Session - 10/13/17 1422    Visit Number  19    Number of Visits  23    Date for PT Re-Evaluation  11/01/17    PT Start Time  1950    PT Stop Time  1430    PT Time Calculation (min)  57 min    Activity Tolerance  Patient tolerated treatment well    Behavior During Therapy  [facility] for tasks assessed/performed       Past Medical History:  Diagnosis Date  . Bone spur    LEFT SHOULDER THAT MAKES HIS LEFT ARM TINGLE  . GERD (gastroesophageal reflux disease)   . Hyperlipidemia   . Hypertension   . Migraines    rare migraines    Past Surgical History:  Procedure Laterality Date  . INGUINAL HERNIA REPAIR Bilateral 01/21/2017   Procedure: LAPAROSCOPIC BILATERAL INGUINAL HERNIA REPAIR;  Surgeon: Jules Husbands, MD;  Location: [facility];  Service: General;  Laterality: Bilateral;  . KNEE SURGERY Left   . SHOULDER ARTHROSCOPY WITH DISTAL CLAVICLE RESECTION Left 07/05/2017   Procedure: LEFT SHOULDER ARTHROSCOPY, SUBACROMIAL DECOMPRESSION, DISTAL CLAVICLE RESECTION, POSSIBLE MINI OPEN ROTATOR CUFF REPAIR;  Surgeon: [name], MD;  Location: [facility];  Service: Orthopedics;  Laterality: Left;  . SHOULDER SURGERY Right   . UMBILICAL HERNIA REPAIR N/A 01/21/2017   Procedure: HERNIA REPAIR UMBILICAL ADULT;  Surgeon: Jules Husbands, MD;  Location: [facility];  Service: General;  Laterality: N/A;    There were no vitals filed for this visit.  Subjective Assessment - 10/13/17 1342    Subjective  oINTMENT HE IS USING IS pENNSAID.  iT IS NOT FOUND IN THE MEDICATION LIST,    Currently in Pain?  Yes    Pain Score  1     Pain Location  Shoulder    Pain Orientation   Left    Pain Descriptors / Indicators  Sore    Pain Radiating Towards  LEFT FOREARM    Aggravating Factors   NIGHTTIME PAIN REACHING    Pain Relieving Factors  NUMBING GEl PennSAID)    Effect of Pain on Daily Activities  Limits some activities    Multiple Pain Sites  No         [facility] PT Assessment - 10/13/17 0001      AROM   Left Shoulder Flexion  125 Degrees            No data recorded       [facility] Adult PT Treatment/Exercise - 10/13/17 0001      Shoulder Exercises: Standing   External Rotation  Strengthening;20 reps    Theraband Level (Shoulder External Rotation)  Level 2 (Red)    Internal Rotation  Strengthening    Flexion  10 reps UE ranger  limited by foearm pain    Extension  Strengthening;20 reps    Theraband Level (Shoulder Extension)  Level 2 (Red)    Row  Strengthening;20 reps    Theraband Level (Shoulder Row)  Level 2 (Red)    Other Standing Exercises  wall push up10 x with fist to preven  forearn pain    Other Standing Exercises  wall press,  flexion,  horizontal  abd/ add circles 30 seconds each  f atigue  forearm burning noted clockwise more pain than clockwise.       Shoulder Exercises: Pulleys   Flexion  3 minutes      Shoulder Exercises: ROM/Strengthening   UBE (Upper Arm Bike)   reverse.  L2 to 4 then 3.5 4 minutes total      Vasopneumatic   Number Minutes Vasopneumatic   15 minutes    Vasopnuematic Location   Shoulder    Vasopneumatic Pressure  Medium    Vasopneumatic Temperature   34      Manual Therapy   Manual Therapy  Soft tissue mobilization    Soft tissue mobilization  peri scapular             PT Education - 10/13/17 1421    Education provided  Yes    Education Details  exercise form    Person(s) Educated  Patient    Methods  Explanation    Comprehension  Verbalized understanding       PT Short Term Goals - 10/11/17 1450      PT SHORT TERM GOAL #1   Title  Patient will be I with initial HEP for Lt UE ROM and  strength     Time  4    Period  Weeks    Status  Achieved      PT SHORT TERM GOAL #2   Title  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 UE      Time  4    Period  Weeks    Status  Achieved        PT Long Term Goals - 10/11/17 1452      PT LONG TERM GOAL #1   Title  Pt will be I with more advanced HEP for L UE     Baseline  independent with exercises so far    Time  8    Period  Weeks    Status  On-going      PT LONG TERM GOAL #2   Title  Pt will score FOTO less than 40% to demo improved functional mobilty.     Time  8    Status  Unable to assess      PT LONG TERM GOAL #3   Title  Pt will be able to raise arm >130 deg for improved overhead reaching anf household tasks.     Baseline  120 AA    Time  8    Period  Weeks    Status  On-going      PT LONG TERM GOAL #4   Title  Pt will demo strength in L UE in 4+/5 throughout for safety with lifting, maximizing functional use in and out of the home.  Time  8    Period  Weeks    Status  Unable to assess      PT LONG TERM GOAL #5   Title  Pt will report no difficulty with grooming and ADLs, to include reaching to lower back and behind head.     Baseline  these are difficult t do    Time  8    Period  Weeks    Status  On-going            Plan - 10/13/17 1423    Clinical Impression Statement  AROM improving see flow sheet.  Pain with exercise however it returned to baseline at end of session.  min cues needed with band exercises for ER.    PT Next Visit Plan  closed chain and AA  as able.  tape if needed.  Manual as needed.  progress ex as able in pain free range,  continue band work    PT Home Exercise Plan  AAROM table slides and pendulums,  ER cane, isometrics, scapular retraction, ext, row and ER with red band, supine cane chest press and AA ER. Manual to pecs    Consulted and Agree with Plan of Care  Patient       Patient will benefit from skilled therapeutic intervention in order to improve the following deficits and impairments:     Visit Diagnosis: Acute pain of left shoulder  Localized edema  Stiffness of left shoulder, not elsewhere classified  Muscle weakness (generalized)     Problem List Patient Active Problem List   Diagnosis Date Noted  . Impingement syndrome of left shoulder 07/05/2017  . 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  . Umbilical hernia without obstruction and without gangrene   . Bilateral inguinal hernia without obstruction or gangrene   . History of tobacco abuse 11/26/2016  . Hypertension   . Hyperlipidemia   . Migraines   . Rotator cuff tendinitis, left 11/12/2015  . Labral tear of shoulder, left, initial encounter 11/12/2015    Raef Sprigg  PTA 10/13/2017, 2:26 PM  [place], [name], [address] Phone: [phone]   Fax:  [phone]  Name: Keith Barber MRN: [id number] Date of Birth: August 10, 1970

## 2017-10-18 ENCOUNTER — Ambulatory Visit: Payer: BLUE CROSS/BLUE SHIELD | Admitting: Physical Therapy

## 2017-10-18 ENCOUNTER — Ambulatory Visit: Payer: BLUE CROSS/BLUE SHIELD | Attending: Orthopaedic Surgery | Admitting: Physical Therapy

## 2017-10-18 ENCOUNTER — Encounter: Payer: Self-pay | Admitting: Physical Therapy

## 2017-10-18 DIAGNOSIS — M25612 Stiffness of left shoulder, not elsewhere classified: Secondary | ICD-10-CM

## 2017-10-18 DIAGNOSIS — M6281 Muscle weakness (generalized): Secondary | ICD-10-CM

## 2017-10-18 DIAGNOSIS — R600 Localized edema: Secondary | ICD-10-CM

## 2017-10-18 DIAGNOSIS — M25512 Pain in left shoulder: Secondary | ICD-10-CM | POA: Diagnosis not present

## 2017-10-18 NOTE — Therapy (Addendum)
[facility] [place], [name], [address] Phone: [phone]   Fax:  [phone]  Physical Therapy Treatment  Patient Details  Name: Keith Barber MRN: [id number] Date of Birth: 19-Apr-1971 Referring Provider: Joni Fears, MD    Encounter Date: 10/18/2017  PT End of Session - 10/18/17 1239    Visit Number  20    Date for PT Re-Evaluation  11/01/17    PT Start Time  1230    PT Stop Time  1320    PT Time Calculation (min)  50 min    Activity Tolerance  Patient tolerated treatment well    Behavior During Therapy  [facility] for tasks assessed/performed       Past Medical History:  Diagnosis Date  . Bone spur    LEFT SHOULDER THAT MAKES HIS LEFT ARM TINGLE  . GERD (gastroesophageal reflux disease)   . Hyperlipidemia   . Hypertension   . Migraines    rare migraines    Past Surgical History:  Procedure Laterality Date  . INGUINAL HERNIA REPAIR Bilateral 01/21/2017   Procedure: LAPAROSCOPIC BILATERAL INGUINAL HERNIA REPAIR;  Surgeon: Jules Husbands, MD;  Location: [facility];  Service: General;  Laterality: Bilateral;  . KNEE SURGERY Left   . SHOULDER ARTHROSCOPY WITH DISTAL CLAVICLE RESECTION Left 07/05/2017   Procedure: LEFT SHOULDER ARTHROSCOPY, SUBACROMIAL DECOMPRESSION, DISTAL CLAVICLE RESECTION, POSSIBLE MINI OPEN ROTATOR CUFF REPAIR;  Surgeon: [name], MD;  Location: [place];  Service: Orthopedics;  Laterality: Left;  . SHOULDER SURGERY Right   . UMBILICAL HERNIA REPAIR N/A 01/21/2017   Procedure: HERNIA REPAIR UMBILICAL ADULT;  Surgeon: Jules Husbands, MD;  Location: [facility];  Service: General;  Laterality: N/A;    There were no vitals filed for this visit.  Subjective Assessment - 10/18/17 1235    Subjective  Pt arriving to therapy reporting pain of 1/10 in left shoulder.     Pertinent History  Rt. shoulder surgery , HTN, GERD , knee surgery    Patient Stated Goals  Pt would like to return to normal use  of L UE     Currently in Pain?  Yes    Pain Score  1     Pain Location  Shoulder    Pain Orientation  Left    Pain Descriptors / Indicators  Sore    Pain Type  Surgical pain    Pain Onset  More than a month ago         [facility] PT Assessment - 10/18/17 0001      AROM   Left Shoulder Flexion  120 Degrees                   [facility] Adult PT Treatment/Exercise - 10/18/17 0001      Shoulder Exercises: Supine   Other Supine Exercises  serratus punches x 5, pt with increased pain in left forearm      Shoulder Exercises: Standing   External Rotation  Strengthening;20 reps    Theraband Level (Shoulder External Rotation)  Level 2 (Red)    Internal Rotation  Strengthening    Flexion  10 reps UE ranger  limited by foearm pain    Extension  Strengthening;20 reps    Theraband Level (Shoulder Extension)  Level 2 (Red)    Row  Strengthening;20 reps    Theraband Level (Shoulder Row)  Level 2 (Red)    Other Standing Exercises  wall reaches x 10    Other Standing  Exercises  -- clockwise more pain than clockwise.       Shoulder Exercises: Pulleys   Flexion  3 minutes      Shoulder Exercises: ROM/Strengthening   UBE (Upper Arm Bike)  reverse: L2-4 for 2.5 minutes, forward L2 for 2.5 minutes 4 minutes total      Vasopneumatic   Number Minutes Vasopneumatic   15 minutes    Vasopnuematic Location   Shoulder    Vasopneumatic Pressure  Medium    Vasopneumatic Temperature   35      Manual Therapy   Manual Therapy  Soft tissue mobilization    Soft tissue mobilization  STW to left forarm secondary to increased pain             PT Education - 10/18/17 1238    Education provided  Yes    Education Details  exercise techniques    Person(s) Educated  Patient    Methods  Demonstration    Comprehension  Verbalized understanding;Returned demonstration       PT Short Term Goals - 10/18/17 1316      PT SHORT TERM GOAL #1   Title  Patient will be I with initial HEP for Lt UE ROM  and strength     Status  Achieved      PT SHORT TERM GOAL #2   Title  Pt will be able to sit for work for typing and dispatching with min pain overall     Baseline  able to do     Status  Achieved      PT SHORT TERM GOAL #3   Title  Pt will be able to demo full PROM in LUE with min pain     Baseline  AA 120 flexion,  ER 40 degrees with 50 degrees abduction.painful end range    Time  4    Period  Weeks    Status  On-going      PT SHORT TERM GOAL #4   Title  Pt will demo less pain overall (25%) with normal mobility tasks with assist of Rt. UE      Baseline  Mobility limited by protocol.  Painful.    Status  Achieved        PT Long Term Goals - 10/18/17 1239      PT LONG TERM GOAL #1   Title  Pt will be I with more advanced HEP for L UE     Status  On-going      PT LONG TERM GOAL #2   Title  Pt will score FOTO less than 40% to demo improved functional mobilty.     Time  8    Period  Weeks    Status  Unable to assess      PT LONG TERM GOAL #3   Title  Pt will be able to raise arm >130 deg for improved overhead reaching anf household tasks.     Period  Weeks    Status  On-going      PT LONG TERM GOAL #4   Title  Pt will demo strength in L UE in 4+/5 throughout for safety with lifting, maximizing functional use in and out of the home.     Time  8    Period  Weeks    Status  Unable to assess      PT LONG TERM GOAL #5   Title  Pt will report no difficulty with grooming and ADLs, to include reaching to  lower back and behind head.     Baseline  these are difficult t do    Time  8    Period  Weeks    Status  On-going            Plan - 10/18/17 1314    Clinical Impression Statement  Pt tolerating treatment well limited by pain in his left forearm. We discussed ergonomics and computer set up when working at home. Pt needed instructions for proper body positioning and techniques during session. Continue skilled PT to progress toward goals set.     Rehab Potential   Excellent    PT Frequency  2x / week    PT Treatment/Interventions  ADLs/Self Care Home Management;Cryotherapy;Ultrasound;Moist Heat;Electrical Stimulation;Therapeutic exercise;Therapeutic activities;Functional mobility training;Neuromuscular re-education;Patient/family education;Passive range of motion;Vasopneumatic Device;Taping    PT Next Visit Plan  closed chain and AA as able.  tape if needed.  Manual as needed.  progress ex as able in pain free range,  continue band work    PT Home Exercise Plan  AAROM table slides and pendulums,  ER cane, isometrics, scapular retraction, ext, row and ER with red band, supine cane chest press and AA ER. Manual to pecs    Consulted and Agree with Plan of Care  Patient       Patient will benefit from skilled therapeutic intervention in order to improve the following deficits and impairments:  Decreased mobility, Impaired sensation, Improper body mechanics, Increased edema, Decreased range of motion, Decreased activity tolerance, Decreased strength, Increased fascial restricitons, Impaired flexibility, Impaired UE functional use, Postural dysfunction, Pain  Visit Diagnosis: Acute pain of left shoulder  Localized edema  Stiffness of left shoulder, not elsewhere classified  Muscle weakness (generalized)     Problem List Patient Active Problem List   Diagnosis Date Noted  . Impingement syndrome of left shoulder 07/05/2017  . 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  . Umbilical hernia without obstruction and without gangrene   . Bilateral inguinal hernia without obstruction or gangrene   . History of tobacco abuse 11/26/2016  . Hypertension   . Hyperlipidemia   . Migraines   . Rotator cuff tendinitis, left 11/12/2015  . Labral tear of shoulder, left, initial encounter 11/12/2015    Oretha Caprice, MPT 10/18/2017, 1:24 PM  [facility] [address] [place], [name],  [address] Phone: [phone]   Fax:  [phone]  Name: Merville Hijazi MRN: [id number] Date of Birth: July 22, 1970

## 2017-10-19 ENCOUNTER — Telehealth: Payer: Self-pay | Admitting: Family Medicine

## 2017-10-19 ENCOUNTER — Encounter: Payer: Self-pay | Admitting: Family Medicine

## 2017-10-19 ENCOUNTER — Ambulatory Visit
Admission: RE | Admit: 2017-10-19 | Discharge: 2017-10-19 | Disposition: A | Discharge status: Home / Self Care | Payer: BLUE CROSS/BLUE SHIELD | Source: Ambulatory Visit | Attending: Family Medicine | Admitting: Family Medicine

## 2017-10-19 ENCOUNTER — Ambulatory Visit (INDEPENDENT_AMBULATORY_CARE_PROVIDER_SITE_OTHER): Payer: BLUE CROSS/BLUE SHIELD | Admitting: Family Medicine

## 2017-10-19 VITALS — BP 114/77 | HR 98 | Temp 98.3°F | Wt 211.3 lb

## 2017-10-19 DIAGNOSIS — R1032 Left lower quadrant pain: Secondary | ICD-10-CM | POA: Diagnosis not present

## 2017-10-19 DIAGNOSIS — K5732 Diverticulitis of large intestine without perforation or abscess without bleeding: Secondary | ICD-10-CM | POA: Diagnosis not present

## 2017-10-19 DIAGNOSIS — D72829 Elevated white blood cell count, unspecified: Secondary | ICD-10-CM | POA: Diagnosis not present

## 2017-10-19 DIAGNOSIS — K760 Fatty (change of) liver, not elsewhere classified: Secondary | ICD-10-CM | POA: Diagnosis not present

## 2017-10-19 LAB — CBC WITH DIFFERENTIAL/PLATELET
HEMATOCRIT: 51.8 % — AB (ref 37.5–51.0)
HEMOGLOBIN: 18.3 g/dL — AB (ref 13.0–17.7)
LYMPHS ABS: 3.2 10*3/uL — AB (ref 0.7–3.1)
Lymphs: 26 %
MCH: 31.5 pg (ref 26.6–33.0)
MCHC: 35.3 g/dL (ref 31.5–35.7)
MCV: 89 fL (ref 79–97)
MID (Absolute): 0.9 10*3/uL (ref 0.1–1.6)
MID: 8 %
NEUTROS PCT: 67 %
Neutrophils Absolute: 8.3 10*3/uL — ABNORMAL HIGH (ref 1.4–7.0)
Platelets: 228 10*3/uL (ref 150–379)
RBC: 5.81 x10E6/uL — ABNORMAL HIGH (ref 4.14–5.80)
RDW: 13.3 % (ref 12.3–15.4)
WBC: 12.4 10*3/uL — ABNORMAL HIGH (ref 3.4–10.8)

## 2017-10-19 LAB — UA/M W/RFLX CULTURE, ROUTINE
Bilirubin, UA: NEGATIVE
GLUCOSE, UA: NEGATIVE
KETONES UA: NEGATIVE
Leukocytes, UA: NEGATIVE
Nitrite, UA: NEGATIVE
RBC, UA: NEGATIVE
SPEC GRAV UA: 1.025 (ref 1.005–1.030)
UUROB: 0.2 mg/dL (ref 0.2–1.0)
pH, UA: 5.5 (ref 5.0–7.5)

## 2017-10-19 MED ORDER — CIPROFLOXACIN HCL 500 MG PO TABS: 500.0000 mg | ORAL_TABLET | Freq: Two times a day (BID) | ORAL | 0 refills | Status: DC

## 2017-10-19 MED ORDER — METRONIDAZOLE 500 MG PO TABS: 500.0000 mg | ORAL_TABLET | Freq: Three times a day (TID) | ORAL | 0 refills | Status: DC

## 2017-10-19 MED ORDER — TRAMADOL HCL 50 MG PO TABS: 50.0000 mg | ORAL_TABLET | Freq: Three times a day (TID) | ORAL | 0 refills | Status: DC | PRN

## 2017-10-19 MED ORDER — RIZATRIPTAN BENZOATE 10 MG PO TABS: 10.0000 mg | ORAL_TABLET | ORAL | 3 refills | Status: DC | PRN

## 2017-10-19 NOTE — Progress Notes (Signed)
BP 114/77 (BP Location: Right Arm, Patient Position: Sitting, Cuff Size: Normal)   Pulse 98   Temp 98.3 F (36.8 C) (Oral)   Wt 211 lb 4.8 oz (95.8 kg)   SpO2 97%   BMI 31.20 kg/m    Subjective:    Patient ID: Keith Barber, male    DOB: 18-Mar-1971, 47 y.o.   MRN: [id number]  HPI: Keith Barber is a 47 y.o. male  Chief Complaint  Patient presents with  . Abdominal Pain    Started yesterday. LLQ. Tender to touch. Can barely sit down and get up.    LLQ abdominal pain x 1 day intermittently, worse with standing and movement. Significantly ttp, states when he presses on this one spot it feels like he's poking through his entire abdomen.Tried heating pad and aleve with minimal relief. Only relief is when relaxing in recliner with feet up. Of note, pt had b/l inguinal hernia repair and umbilical hernia repair with mesh late last year which was successful and uncomplicated. Denies fever, chills, diarrhea, N/V, urinary sxs, back pain.   Relevant past medical, surgical, family and social history reviewed and updated as indicated. Interim medical history since our last visit reviewed. Allergies and medications reviewed and updated.  Review of Systems  Per HPI unless specifically indicated above     Objective:    BP 114/77 (BP Location: Right Arm, Patient Position: Sitting, Cuff Size: Normal)   Pulse 98   Temp 98.3 F (36.8 C) (Oral)   Wt 211 lb 4.8 oz (95.8 kg)   SpO2 97%   BMI 31.20 kg/m   Wt Readings from Last 3 Encounters:  10/19/17 211 lb 4.8 oz (95.8 kg)  09/21/17 208 lb (94.3 kg)  09/16/17 195 lb (88.5 kg)    Physical Exam  Constitutional: He is oriented to person, place, and time. He appears well-developed and well-nourished. He appears distressed (appears in pain).  HENT:  Head: Atraumatic.  Eyes: Pupils are equal, round, and reactive to light. Conjunctivae are normal. No scleral icterus.  Neck: Normal range of motion. Neck supple.  Cardiovascular: Normal rate and  normal heart sounds.  Pulmonary/Chest: Effort normal and breath sounds normal. No respiratory distress.  Abdominal: Bowel sounds are normal. He exhibits distension (very minimal firmness/bloating to palpation). He exhibits no mass. There is tenderness (Signifiantly ttp LLQ, with referred pain with palpation of RLQ). There is guarding. There is no rebound.  Musculoskeletal: Normal range of motion.  Neurological: He is alert and oriented to person, place, and time.  Skin: Skin is warm and dry.  Psychiatric: He has a normal mood and affect. His behavior is normal.  Nursing note and vitals reviewed.   Results for orders placed or performed in visit on 10/19/17  UA/M w/rflx Culture, Routine  Result Value Ref Range   Specific Gravity, UA 1.025 1.005 - 1.030   pH, UA 5.5 5.0 - 7.5   Color, UA Yellow Yellow   Appearance Ur Clear Clear   Leukocytes, UA Negative Negative   Protein, UA Trace (A) Negative/Trace   Glucose, UA Negative Negative   Ketones, UA Negative Negative   RBC, UA Negative Negative   Bilirubin, UA Negative Negative   Urobilinogen, Ur 0.2 0.2 - 1.0 mg/dL   Nitrite, UA Negative Negative  CBC With Differential/Platelet  Result Value Ref Range   WBC 12.4 (H) 3.4 - 10.8 x10E3/uL   RBC 5.81 (H) 4.14 - 5.80 x10E6/uL   Hemoglobin 18.3 (H) 13.0 - 17.7 g/dL  Hematocrit 51.8 (H) 37.5 - 51.0 %   MCV 89 79 - 97 fL   MCH 31.5 26.6 - 33.0 pg   MCHC 35.3 31.5 - 35.7 g/dL   RDW 13.3 12.3 - 15.4 %   Platelets 228 150 - 379 x10E3/uL   Neutrophils 67 Not Estab. %   Lymphs 26 Not Estab. %   MID 8 Not Estab. %   Neutrophils Absolute 8.3 (H) 1.4 - 7.0 x10E3/uL   Lymphocytes Absolute 3.2 (H) 0.7 - 3.1 x10E3/uL   MID (Absolute) 0.9 0.1 - 1.6 X10E3/uL  Comprehensive metabolic panel  Result Value Ref Range   Glucose 134 (H) 65 - 99 mg/dL   BUN 12 6 - 24 mg/dL   Creatinine, Ser 1.15 0.76 - 1.27 mg/dL   GFR calc non Af Amer 75 >59 mL/min/1.73   GFR calc Af Amer 87 >59 mL/min/1.73    BUN/Creatinine Ratio 10 9 - 20   Sodium 141 134 - 144 mmol/L   Potassium 4.2 3.5 - 5.2 mmol/L   Chloride 102 96 - 106 mmol/L   CO2 23 20 - 29 mmol/L   Calcium 9.0 8.7 - 10.2 mg/dL   Total Protein 7.5 6.0 - 8.5 g/dL   Albumin 4.7 3.5 - 5.5 g/dL   Globulin, Total 2.8 1.5 - 4.5 g/dL   Albumin/Globulin Ratio 1.7 1.2 - 2.2   Bilirubin Total 0.8 0.0 - 1.2 mg/dL   Alkaline Phosphatase 128 (H) 39 - 117 IU/L   AST 30 0 - 40 IU/L   ALT 60 (H) 0 - 44 IU/L      Assessment & Plan:   Problem List Items Addressed This Visit    None    Visit Diagnoses    LLQ pain    -  Primary   Relevant Orders   UA/M w/rflx Culture, Routine (Completed)   CBC With Differential/Platelet (Completed)   Comprehensive metabolic panel (Completed)   Leukocytosis, unspecified type       Relevant Orders   CT Abdomen Pelvis Wo Contrast (Completed)    Pt currently afebrile, but appears in significant pain. U/A WNL, CBC showing leukocytosis. This paired with concerning abdominal exam findings warrants a STAT CT abdomen pelvis to evaluate for post-surgical complications vs diverticulitis w/wo perforation vs ischemic bowel etc. Will send home with small amount of tramadol for pain control, discussed bland diet, pushing fluids, rest. Await CT results. Strict ER precautions given in meantime.  Sedation and addiction precautions discussed at length with pt regarding the pain medication.   Follow up plan: Return for next week as scheduled.

## 2017-10-19 NOTE — Telephone Encounter (Signed)
Received result report from [facility] regarding acute diverticulitis, advised the pt can be released home. Called pt and discussed dx, will start cipro and flagyl as well as tramadol prn for the severe pain. Discussed bland diet, push fluids, tylenol prn. Strict return precautions reviewed with pt including fevers, severe worsening pain sxs, bloody BMs, severe uncontrolled diarrhea - pt aware to go to ER for any of these changes. Has f/u with [place] next week if improving, will recheck things then

## 2017-10-20 LAB — COMPREHENSIVE METABOLIC PANEL
A/G RATIO: 1.7 (ref 1.2–2.2)
ALT: 60 IU/L — AB (ref 0–44)
AST: 30 IU/L (ref 0–40)
Albumin: 4.7 g/dL (ref 3.5–5.5)
Alkaline Phosphatase: 128 IU/L — ABNORMAL HIGH (ref 39–117)
BILIRUBIN TOTAL: 0.8 mg/dL (ref 0.0–1.2)
BUN/Creatinine Ratio: 10 (ref 9–20)
BUN: 12 mg/dL (ref 6–24)
CALCIUM: 9 mg/dL (ref 8.7–10.2)
CHLORIDE: 102 mmol/L (ref 96–106)
CO2: 23 mmol/L (ref 20–29)
Creatinine, Ser: 1.15 mg/dL (ref 0.76–1.27)
GFR calc Af Amer: 87 mL/min/{1.73_m2} (ref >59)
GFR, EST NON AFRICAN AMERICAN: 75 mL/min/{1.73_m2} (ref >59)
GLUCOSE: 134 mg/dL — AB (ref 65–99)
Globulin, Total: 2.8 g/dL (ref 1.5–4.5)
POTASSIUM: 4.2 mmol/L (ref 3.5–5.2)
Sodium: 141 mmol/L (ref 134–144)
Total Protein: 7.5 g/dL (ref 6.0–8.5)

## 2017-10-21 ENCOUNTER — Telehealth: Payer: Self-pay | Admitting: Family Medicine

## 2017-10-21 NOTE — Telephone Encounter (Signed)
Called pt to check on how he's doing, still in a fair amount of pain but slept well overnight and feeling a good bit better this morning. No fevers, diarrhea, bloody stools, worsening abdominal pain. Tolerating medications well. Reviewed his lab results which were unremarkable other than mildly elevated WBCs as expected. Strict ER return precautions given for over weekend and notified of on-call after hours service through our clinics if questions arise over weekend. Will f/u as scheduled next week

## 2017-10-22 NOTE — Patient Instructions (Signed)
Follow up as scheduled.  

## 2017-10-25 ENCOUNTER — Ambulatory Visit: Payer: BLUE CROSS/BLUE SHIELD | Admitting: Physical Therapy

## 2017-10-25 ENCOUNTER — Telehealth: Payer: Self-pay | Admitting: Family Medicine

## 2017-10-25 MED ORDER — ONDANSETRON 4 MG PO TBDP: 4.0000 mg | ORAL_TABLET | Freq: Three times a day (TID) | ORAL | 0 refills | Status: DC | PRN

## 2017-10-25 NOTE — Telephone Encounter (Signed)
Rx for zofran sent to his pharmacy

## 2017-10-25 NOTE — Telephone Encounter (Signed)
Pt   Reports  Nausea   And  Headache -  No  Vomiting   - No  Abdominal pain - No diarrhea  Symptoms started  5  Days  Ago   After  Starting  Anti biotics    Seen  By Merrie Roof  For diverticulitis    Pt  Taking  Cipro  And  Flagyl   -  Denies any  ETOH  Consumption   Pt  Is  Requesting  Something    For  Nausea     Pharmacy  [facility]  On [address]     Pts  Phone  Number is  [phone]

## 2017-10-25 NOTE — Telephone Encounter (Signed)
Copied from [place] [id number]. Topic: Quick Communication - Rx Refill/Question >> Oct 25, 2017  1:09 PM Oliver Pila B wrote: Medication: ciprofloxacin (CIPRO) 500 MG tablet [[id number], metroNIDAZOLE (FLAGYL) 500 MG tablet [[id number]]  Pt called b/c the medication makes him feel nauseous and he wants to know if he can take dramamine w/ the medication to help w/ that feeling, pt is aware that Merrie Roof is out of the office and would like to have a nurse to contact him w/ a suggestion, call pt to advise

## 2017-10-27 ENCOUNTER — Ambulatory Visit (INDEPENDENT_AMBULATORY_CARE_PROVIDER_SITE_OTHER): Payer: BLUE CROSS/BLUE SHIELD | Admitting: Family Medicine

## 2017-10-27 ENCOUNTER — Encounter: Payer: Self-pay | Admitting: Family Medicine

## 2017-10-27 VITALS — BP 113/78 | HR 86 | Temp 98.4°F | Ht 69.0 in | Wt 207.0 lb

## 2017-10-27 DIAGNOSIS — K5792 Diverticulitis of intestine, part unspecified, without perforation or abscess without bleeding: Secondary | ICD-10-CM

## 2017-10-27 DIAGNOSIS — D72829 Elevated white blood cell count, unspecified: Secondary | ICD-10-CM

## 2017-10-27 DIAGNOSIS — R05 Cough: Secondary | ICD-10-CM | POA: Diagnosis not present

## 2017-10-27 DIAGNOSIS — J029 Acute pharyngitis, unspecified: Secondary | ICD-10-CM

## 2017-10-27 DIAGNOSIS — R059 Cough, unspecified: Secondary | ICD-10-CM

## 2017-10-27 MED ORDER — BUDESONIDE-FORMOTEROL FUMARATE 160-4.5 MCG/ACT IN AERO: 2.0000 | INHALATION_SPRAY | Freq: Two times a day (BID) | RESPIRATORY_TRACT | 11 refills | Status: DC

## 2017-10-27 NOTE — Progress Notes (Signed)
BP 113/78   Pulse 86   Temp 98.4 F (36.9 C) (Oral)   Ht 5\' 9"  (1.753 m)   Wt 207 lb (93.9 kg)   SpO2 96%   BMI 30.57 kg/m    Subjective:    Patient ID: Keith Barber, male    DOB: 07-01-1971, 47 y.o.   MRN: [id number]  HPI: Keith Barber is a 47 y.o. male  Chief Complaint  Patient presents with  . Hyperlipidemia  . Hypertension   Pt here today for f/u of multiple issues.   Feeling much better from the diverticulitis. Just finished his abx yesterday. The zofran helped a lot with the nausea he had while on them. Still some discomfort in the lower left abdomen, but moving around much easier and stools are regular and soft, non-bloody. No recent fevers. Tolerating PO well without difficulty.   Still using the carafate with good relief of the scratchy throat. Denies dysphagia.  Finished the flonase and asmanex, unsure if they helped much but thinks they did improve his cough some. Denies wheezing, SOB, productivity of cough, fevers.   Past Medical History:  Diagnosis Date  . Bone spur    LEFT SHOULDER THAT MAKES HIS LEFT ARM TINGLE  . GERD (gastroesophageal reflux disease)   . Hyperlipidemia   . Hypertension   . Migraines    rare migraines   Social History   Socioeconomic History  . Marital status: Divorced    Spouse name: Not on file  . Number of children: Not on file  . Years of education: Not on file  . Highest education level: Not on file  Occupational History  . Not on file  Social Needs  . Financial resource strain: Not on file  . Food insecurity:    Worry: Not on file    Inability: Not on file  . Transportation needs:    Medical: Not on file    Non-medical: Not on file  Tobacco Use  . Smoking status: Former Smoker    Packs/day: 0.50    Years: 26.00    Pack years: 13.00    Types: Cigarettes    Last attempt to quit: 01/13/2017    Years since quitting: 0.7  . Smokeless tobacco: Never Used  . Tobacco comment: PT STARTED TAKING ZYBAN ON 01-08-17 IN HOPES  OF QUITTING  Substance and Sexual Activity  . Alcohol use: No  . Drug use: No  . Sexual activity: Not on file  Lifestyle  . Physical activity:    Days per week: Not on file    Minutes per session: Not on file  . Stress: Not on file  Relationships  . Social connections:    Talks on phone: Not on file    Gets together: Not on file    Attends religious service: Not on file    Active member of club or organization: Not on file    Attends meetings of clubs or organizations: Not on file    Relationship status: Not on file  . Intimate partner violence:    Fear of current or ex partner: Not on file    Emotionally abused: Not on file    Physically abused: Not on file    Forced sexual activity: Not on file  Other Topics Concern  . Not on file  Social History Narrative  . Not on file   Relevant past medical, surgical, family and social history reviewed and updated as indicated. Interim medical history since our last visit reviewed. Allergies  and medications reviewed and updated.  Review of Systems  Per HPI unless specifically indicated above     Objective:    BP 113/78   Pulse 86   Temp 98.4 F (36.9 C) (Oral)   Ht 5\' 9"  (1.753 m)   Wt 207 lb (93.9 kg)   SpO2 96%   BMI 30.57 kg/m   Wt Readings from Last 3 Encounters:  10/28/17 207 lb (93.9 kg)  10/27/17 207 lb (93.9 kg)  10/19/17 211 lb 4.8 oz (95.8 kg)    Physical Exam  Constitutional: He is oriented to person, place, and time. He appears well-developed and well-nourished. No distress.  HENT:  Head: Atraumatic.  Nose: Nose normal.  Mouth/Throat: Oropharynx is clear and moist. No oropharyngeal exudate.  Eyes: Pupils are equal, round, and reactive to light. Conjunctivae and EOM are normal.  Neck: Normal range of motion. Neck supple.  Cardiovascular: Normal rate and regular rhythm.  Pulmonary/Chest: Effort normal and breath sounds normal. No respiratory distress.  Abdominal: Soft. Bowel sounds are normal. He exhibits  no distension. There is tenderness (LLQ).  Musculoskeletal: Normal range of motion.  Neurological: He is alert and oriented to person, place, and time.  Skin: Skin is warm and dry.  Psychiatric: He has a normal mood and affect. His behavior is normal.  Nursing note and vitals reviewed.   Results for orders placed or performed in visit on 10/27/17  CBC with Differential/Platelet  Result Value Ref Range   WBC 9.5 3.4 - 10.8 x10E3/uL   RBC 5.70 4.14 - 5.80 x10E6/uL   Hemoglobin 17.2 13.0 - 17.7 g/dL   Hematocrit 51.0 37.5 - 51.0 %   MCV 90 79 - 97 fL   MCH 30.2 26.6 - 33.0 pg   MCHC 33.7 31.5 - 35.7 g/dL   RDW 12.8 12.3 - 15.4 %   Platelets 215 150 - 379 x10E3/uL   Neutrophils 68 Not Estab. %   Lymphs 24 Not Estab. %   Monocytes 7 Not Estab. %   Eos 1 Not Estab. %   Basos 0 Not Estab. %   Neutrophils Absolute 6.5 1.4 - 7.0 x10E3/uL   Lymphocytes Absolute 2.2 0.7 - 3.1 x10E3/uL   Monocytes Absolute 0.7 0.1 - 0.9 x10E3/uL   EOS (ABSOLUTE) 0.1 0.0 - 0.4 x10E3/uL   Basophils Absolute 0.0 0.0 - 0.2 x10E3/uL   Immature Granulocytes 0 Not Estab. %   Immature Grans (Abs) 0.0 0.0 - 0.1 x10E3/uL      Assessment & Plan:   Problem List Items Addressed This Visit    None    Visit Diagnoses    Diverticulitis    -  Primary   Significantly improved with abx, completed those and tolerating PO well. Some lingering pain, will monitor closely. Recheck CBC, bland diet, push fluids   Leukocytosis, unspecified type       Will recheck CBC today, elevated previously from acute diverticulitis. Now has completed abx and is slowly resolving symptomatically   Relevant Orders   CBC with Differential/Platelet (Completed)   Sore throat       Improved with carafate, will continue current regimen. Recheck at next visit   Cough       Some benefit with asmanex and allergy regimen, but still coughing. Will consider switching off lisinopril and monitor closely for benefit       Follow up plan: Return  for as scheduled.

## 2017-10-28 ENCOUNTER — Ambulatory Visit (INDEPENDENT_AMBULATORY_CARE_PROVIDER_SITE_OTHER): Payer: BLUE CROSS/BLUE SHIELD | Admitting: Orthopaedic Surgery

## 2017-10-28 ENCOUNTER — Encounter (INDEPENDENT_AMBULATORY_CARE_PROVIDER_SITE_OTHER): Payer: Self-pay | Admitting: Orthopaedic Surgery

## 2017-10-28 ENCOUNTER — Ambulatory Visit: Payer: BLUE CROSS/BLUE SHIELD | Admitting: Physical Therapy

## 2017-10-28 VITALS — BP 116/81 | HR 106 | Resp 18 | Ht 68.0 in | Wt 207.0 lb

## 2017-10-28 DIAGNOSIS — M6281 Muscle weakness (generalized): Secondary | ICD-10-CM

## 2017-10-28 DIAGNOSIS — M25512 Pain in left shoulder: Secondary | ICD-10-CM

## 2017-10-28 DIAGNOSIS — M25612 Stiffness of left shoulder, not elsewhere classified: Secondary | ICD-10-CM

## 2017-10-28 DIAGNOSIS — R600 Localized edema: Secondary | ICD-10-CM

## 2017-10-28 DIAGNOSIS — G8929 Other chronic pain: Secondary | ICD-10-CM

## 2017-10-28 LAB — CBC WITH DIFFERENTIAL/PLATELET
BASOS: 0 %
Basophils Absolute: 0 10*3/uL (ref 0.0–0.2)
EOS (ABSOLUTE): 0.1 10*3/uL (ref 0.0–0.4)
EOS: 1 %
HEMATOCRIT: 51 % (ref 37.5–51.0)
Hemoglobin: 17.2 g/dL (ref 13.0–17.7)
IMMATURE GRANS (ABS): 0 10*3/uL (ref 0.0–0.1)
IMMATURE GRANULOCYTES: 0 %
Lymphocytes Absolute: 2.2 10*3/uL (ref 0.7–3.1)
Lymphs: 24 %
MCH: 30.2 pg (ref 26.6–33.0)
MCHC: 33.7 g/dL (ref 31.5–35.7)
MCV: 90 fL (ref 79–97)
MONOS ABS: 0.7 10*3/uL (ref 0.1–0.9)
Monocytes: 7 %
NEUTROS ABS: 6.5 10*3/uL (ref 1.4–7.0)
Neutrophils: 68 %
Platelets: 215 10*3/uL (ref 150–379)
RBC: 5.7 x10E6/uL (ref 4.14–5.80)
RDW: 12.8 % (ref 12.3–15.4)
WBC: 9.5 10*3/uL (ref 3.4–10.8)

## 2017-10-28 NOTE — Progress Notes (Signed)
Office Visit Note   Patient: Keith Barber           Date of Birth: February 15, 1971           MRN: [id number] Visit Date: 10/28/2017              Requested by: Volney American, PA-C [place], [place] [address] PCP: Volney American, [place]   Assessment & Plan: Visit Diagnoses:  1. Chronic left shoulder pain     Plan: 3 months status post arthroscopic SCD DCR and labral repair.  Has developed adhesive capsulitis.  Attends his last physical therapy session today.  Long discussion regarding his stiffness.  We will plan to see him back in a month and if still having difficulty would consider manipulation.  He continues to work  Follow-Up Instructions: Return in about 1 month (around 11/27/2017).   Orders:  No orders of the defined types were placed in this encounter.  No orders of the defined types were placed in this encounter.     Procedures: No procedures performed   Clinical Data: No additional findings.   Subjective: Chief Complaint  Patient presents with  . Left Shoulder - Follow-up  . Follow-up    left shoulder has pain, more towards the elbow still feels muscle pulling.  No pain below shoulder level.  Think some difficulty with external rotation and overhead movement as he has "stiff".  Recently had a bout of diverticulitis and has been unable to exercise  HPI  Review of Systems   Objective: Vital Signs: BP 116/81 (BP Location: Right Arm, Patient Position: Sitting, Cuff Size: Normal)   Pulse (!) 106   Resp 18   Ht 5\' 8"  (1.727 m)   Wt 207 lb (93.9 kg)   BMI 31.47 kg/m   Physical Exam  Ortho Exam awake alert and oriented x3 comfortable sitting.  Approximately 120-30 degrees of flexion, 90 degrees of abduction.  About 10 degrees of external rotation.  Above consistent with adhesive capsulitis.  No impingement.  No pain over the acromioclavicular joint.  Biceps intact vascular exam intact  Specialty Comments:  No specialty comments  available.  Imaging: No results found.   PMFS History: Patient Active Problem List   Diagnosis Date Noted  . Impingement syndrome of left shoulder 07/05/2017  . 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  . Umbilical hernia without obstruction and without gangrene   . Bilateral inguinal hernia without obstruction or gangrene   . History of tobacco abuse 11/26/2016  . Hypertension   . Hyperlipidemia   . Migraines   . Rotator cuff tendinitis, left 11/12/2015  . Labral tear of shoulder, left, initial encounter 11/12/2015   Past Medical History:  Diagnosis Date  . Bone spur    LEFT SHOULDER THAT MAKES HIS LEFT ARM TINGLE  . GERD (gastroesophageal reflux disease)   . Hyperlipidemia   . Hypertension   . Migraines    rare migraines    Family History  Problem Relation Age of Onset  . Diabetes Mother   . Migraines Mother   . Cancer Neg Hx   . COPD Neg Hx   . Heart disease Neg Hx   . Stroke Neg Hx     Past Surgical History:  Procedure Laterality Date  . INGUINAL HERNIA REPAIR Bilateral 01/21/2017   Procedure: LAPAROSCOPIC BILATERAL INGUINAL HERNIA REPAIR;  Surgeon: Jules Husbands, MD;  Location: [facility];  Service: General;  Laterality: Bilateral;  . KNEE SURGERY Left   .  SHOULDER ARTHROSCOPY WITH DISTAL CLAVICLE RESECTION Left 07/05/2017   Procedure: LEFT SHOULDER ARTHROSCOPY, SUBACROMIAL DECOMPRESSION, DISTAL CLAVICLE RESECTION, POSSIBLE MINI OPEN ROTATOR CUFF REPAIR;  Surgeon: [name], MD;  Location: [place];  Service: Orthopedics;  Laterality: Left;  . SHOULDER SURGERY Right   . UMBILICAL HERNIA REPAIR N/A 01/21/2017   Procedure: HERNIA REPAIR UMBILICAL ADULT;  Surgeon: Jules Husbands, MD;  Location: [facility];  Service: General;  Laterality: N/A;   Social History   Occupational History  . Not on file  Tobacco Use  . Smoking status: Former Smoker    Packs/day: 0.50    Years: 26.00    Pack years: 13.00    Types: Cigarettes    Last attempt  to quit: 01/13/2017    Years since quitting: 0.7  . Smokeless tobacco: Never Used  . Tobacco comment: PT STARTED TAKING ZYBAN ON 01-08-17 IN HOPES OF QUITTING  Substance and Sexual Activity  . Alcohol use: No  . Drug use: No  . Sexual activity: Not on file     [name], MD   Note - This record has been created using [employer].  Chart creation errors have been sought, but may not always  have been located. Such creation errors do not reflect on  the standard of medical care.

## 2017-10-28 NOTE — Progress Notes (Deleted)
Office Visit Note   Patient: Keith Barber           Date of Birth: Jan 04, 1971           MRN: [id number] Visit Date: 10/28/2017              Requested by: Volney American, PA-C [place], [place] [address] PCP: Volney American, [place]   Assessment & Plan: Visit Diagnoses: No diagnosis found.  Plan: ***  Follow-Up Instructions: No follow-ups on file.   Orders:  No orders of the defined types were placed in this encounter.  No orders of the defined types were placed in this encounter.     Procedures: No procedures performed   Clinical Data: No additional findings.   Subjective: No chief complaint on file.   HPI  Review of Systems  Constitutional: Negative for fatigue.  HENT: Negative for ear pain.   Eyes: Negative for pain.  Respiratory: Positive for cough and shortness of breath.   Cardiovascular: Negative for leg swelling.  Gastrointestinal: Negative for constipation and diarrhea.  Genitourinary: Negative for difficulty urinating.  Musculoskeletal: Negative for back pain and neck pain.  Skin: Negative for rash.  Allergic/Immunologic: Negative for food allergies.  Neurological: Positive for weakness. Negative for numbness.  Psychiatric/Behavioral: Negative for sleep disturbance.     Objective: Vital Signs: There were no vitals taken for this visit.  Physical Exam  Ortho Exam  Specialty Comments:  No specialty comments available.  Imaging: No results found.   PMFS History: Patient Active Problem List   Diagnosis Date Noted  . Impingement syndrome of left shoulder 07/05/2017  . 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  . Umbilical hernia without obstruction and without gangrene   . Bilateral inguinal hernia without obstruction or gangrene   . History of tobacco abuse 11/26/2016  . Hypertension   . Hyperlipidemia   . Migraines   . Rotator cuff tendinitis, left 11/12/2015  . Labral tear of shoulder,  left, initial encounter 11/12/2015   Past Medical History:  Diagnosis Date  . Bone spur    LEFT SHOULDER THAT MAKES HIS LEFT ARM TINGLE  . GERD (gastroesophageal reflux disease)   . Hyperlipidemia   . Hypertension   . Migraines    rare migraines    Family History  Problem Relation Age of Onset  . Diabetes Mother   . Migraines Mother   . Cancer Neg Hx   . COPD Neg Hx   . Heart disease Neg Hx   . Stroke Neg Hx     Past Surgical History:  Procedure Laterality Date  . INGUINAL HERNIA REPAIR Bilateral 01/21/2017   Procedure: LAPAROSCOPIC BILATERAL INGUINAL HERNIA REPAIR;  Surgeon: Jules Husbands, MD;  Location: [facility];  Service: General;  Laterality: Bilateral;  . KNEE SURGERY Left   . SHOULDER ARTHROSCOPY WITH DISTAL CLAVICLE RESECTION Left 07/05/2017   Procedure: LEFT SHOULDER ARTHROSCOPY, SUBACROMIAL DECOMPRESSION, DISTAL CLAVICLE RESECTION, POSSIBLE MINI OPEN ROTATOR CUFF REPAIR;  Surgeon: [name], MD;  Location: [place];  Service: Orthopedics;  Laterality: Left;  . SHOULDER SURGERY Right   . UMBILICAL HERNIA REPAIR N/A 01/21/2017   Procedure: HERNIA REPAIR UMBILICAL ADULT;  Surgeon: Jules Husbands, MD;  Location: [facility];  Service: General;  Laterality: N/A;   Social History   Occupational History  . Not on file  Tobacco Use  . Smoking status: Former Smoker    Packs/day: 0.50    Years: 26.00  Pack years: 13.00    Types: Cigarettes    Last attempt to quit: 01/13/2017    Years since quitting: 0.7  . Smokeless tobacco: Never Used  . Tobacco comment: PT STARTED TAKING ZYBAN ON 01-08-17 IN HOPES OF QUITTING  Substance and Sexual Activity  . Alcohol use: No  . Drug use: No  . Sexual activity: Not on file

## 2017-10-28 NOTE — Therapy (Signed)
[place], [name], [address] Phone: [phone]   Fax:  [phone]  Physical Therapy Treatment and Discharge   Patient Details  Name: Keith Barber MRN: [id number] Date of Birth: 03/18/1971 Referring Provider: Joni Fears, MD    Encounter Date: 10/28/2017  PT End of Session - 10/28/17 0932    Visit Number  21    Number of Visits  23    Date for PT Re-Evaluation  11/01/17    PT Start Time  0846    PT Stop Time  0928    PT Time Calculation (min)  42 min    Activity Tolerance  Patient tolerated treatment well    Behavior During Therapy  [facility] for tasks assessed/performed       Past Medical History:  Diagnosis Date  . Bone spur    LEFT SHOULDER THAT MAKES HIS LEFT ARM TINGLE  . GERD (gastroesophageal reflux disease)   . Hyperlipidemia   . Hypertension   . Migraines    rare migraines    Past Surgical History:  Procedure Laterality Date  . INGUINAL HERNIA REPAIR Bilateral 01/21/2017   Procedure: LAPAROSCOPIC BILATERAL INGUINAL HERNIA REPAIR;  Surgeon: Jules Husbands, MD;  Location: [facility];  Service: General;  Laterality: Bilateral;  . KNEE SURGERY Left   . SHOULDER ARTHROSCOPY WITH DISTAL CLAVICLE RESECTION Left 07/05/2017   Procedure: LEFT SHOULDER ARTHROSCOPY, SUBACROMIAL DECOMPRESSION, DISTAL CLAVICLE RESECTION, POSSIBLE MINI OPEN ROTATOR CUFF REPAIR;  Surgeon: [name], MD;  Location: [place];  Service: Orthopedics;  Laterality: Left;  . SHOULDER SURGERY Right   . UMBILICAL HERNIA REPAIR N/A 01/21/2017   Procedure: HERNIA REPAIR UMBILICAL ADULT;  Surgeon: Jules Husbands, MD;  Location: [facility];  Service: General;  Laterality: N/A;    There were no vitals filed for this visit.  Subjective Assessment - 10/28/17 0859    Subjective  No pain at rest. Saw MD this Am.  Its frozen and hes going to manipulate it in a month.          [facility] PT Assessment - 10/28/17 0001       Observation/Other Assessments   Focus on Therapeutic Outcomes (FOTO)   38%      AROM   Left Shoulder Flexion  126 Degrees    Left Shoulder ABduction  122 Degrees         [facility] Adult PT Treatment/Exercise - 10/28/17 0001      Self-Care   Other Self-Care Comments   adhesive capsulitis, plan, DC, HEP       Shoulder Exercises: Standing   Horizontal ABduction  Strengthening;Both;10 reps    External Rotation  Strengthening;20 reps    Theraband Level (Shoulder External Rotation)  Level 2 (Red)    Internal Rotation  Strengthening;Left;10 reps red    Extension  Strengthening;20 reps    Theraband Level (Shoulder Extension)  Level 2 (Red)    Row  Strengthening;20 reps    Theraband Level (Shoulder Row)  Level 2 (Red)    Other Standing Exercises  wall circles x 2 min in flexion and abduction small ROM       Shoulder Exercises: Pulleys   Flexion  3 minutes      Shoulder Exercises: ROM/Strengthening   Wall Wash  wall ladder       Manual Therapy   Joint Mobilization  GR. 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 UE      Status  Achieved        PT Long Term Goals - 10/28/17 0912       PT LONG TERM GOAL #1   Title  Pt will be I with more advanced HEP for L UE     Status  Achieved      PT LONG TERM GOAL #2   Title  Pt will score FOTO less than 40% to demo improved functional mobilty.     Status  Achieved      PT LONG TERM GOAL #3   Title  Pt will be able to raise arm >130 deg for improved overhead reaching anf household tasks.     Baseline  improved, today 126 deg limited by abdominal wall pulling, pain     Status  Partially Met      PT LONG TERM GOAL #4   Title  Pt will demo strength in L UE in 4+/5 throughout for safety with lifting, maximizing functional use in and out of the home.     Status  Not Met      PT LONG TERM GOAL #5   Title  Pt will report no difficulty with grooming and ADLs, to include reaching to lower back and behind head.     Status  Not Met            Plan - 10/28/17 0921    Clinical Impression Statement  Patient limited by forearm and bicep pain. Pain in IR 4/5, ER 3+/5, shoulder flexion 4/5 with pain.  He has made progress but has plateued with progress.  He has a good home program and is fairly functional with his LUE at work and with ADLs.  He can reach to his L hip but cannot get his hand behind his back.      PT Treatment/Interventions  ADLs/Self Care Home Management;Cryotherapy;Ultrasound;Moist Heat;Electrical Stimulation;Therapeutic exercise;Therapeutic activities;Functional mobility training;Neuromuscular re-education;Patient/family education;Passive range of motion;Vasopneumatic Device;Taping    PT Next Visit Plan  DC    PT Home Exercise Plan  AAROM table slides and pendulums,  ER cane, isometrics, scapular retraction, ext, row and ER with red band, supine cane chest press and AA ER. Manual to pecs    Consulted and Agree with Plan of Care  Patient       Patient will benefit from skilled therapeutic intervention in  order to improve the following deficits and impairments:  Decreased mobility, Impaired sensation, Improper body  mechanics, Increased edema, Decreased range of motion, Decreased activity tolerance, Decreased strength, Increased fascial restricitons, Impaired flexibility, Impaired UE functional use, Postural dysfunction, Pain  Visit Diagnosis: Acute pain of left shoulder  Localized edema  Stiffness of left shoulder, not elsewhere classified  Muscle weakness (generalized)     Problem List Patient Active Problem List   Diagnosis Date Noted  . Impingement syndrome of left shoulder 07/05/2017  . 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  . Umbilical hernia without obstruction and without gangrene   . Bilateral inguinal hernia without obstruction or gangrene   . History of tobacco abuse 11/26/2016  . Hypertension   . Hyperlipidemia   . Migraines   . Rotator cuff tendinitis, left 11/12/2015  . Labral tear of shoulder, left, initial encounter 11/12/2015    PAA,JENNIFER 10/28/2017, 9:34 AM  [address] [place], [name], [address] Phone: [phone]   Fax:  [phone]  Name: Keith Barber MRN: [id number] Date of Birth: 12/20/1970  PHYSICAL THERAPY DISCHARGE SUMMARY  Visits from Start of Care: 21 Current functional level related to goals / functional outcomes: See above    Remaining deficits: ROM, pain, strength   Education / Equipment: HEP, posture, ergonomics, adhesive capsulitis Plan: Patient agrees to discharge.  Patient goals were partially met. Patient is being discharged due to lack of progress.  ?????     Raeford Razor, PT 10/28/17 9:35 AM Phone: [phone] Fax: [phone]

## 2017-10-28 NOTE — Patient Instructions (Signed)
Adhesive Capsulitis Adhesive capsulitis is inflammation of the tendons and ligaments that surround the shoulder joint (shoulder capsule). This condition causes the shoulder to become stiff and painful to move. Adhesive capsulitis is also called frozen shoulder. What are the causes? This condition may be caused by:  An injury to the shoulder joint.  Straining the shoulder.  Not moving the shoulder for a period of time. This can happen if your arm was injured or in a sling.  Long-standing health problems, such as: ? Diabetes. ? Thyroid problems. ? Heart disease. ? Stroke. ? Rheumatoid arthritis. ? Lung disease.  In some cases, the cause may not be known. What increases the risk? This condition is more likely to develop in:  Women.  People who are older than 48 years of age.  What are the signs or symptoms? Symptoms of this condition include:  Pain in the shoulder when moving the arm. There may also be pain when parts of the shoulder are touched. The pain is worse at night or when at rest.  Soreness or aching in the shoulder.  Inability to move the shoulder normally.  Muscle spasms.  How is this diagnosed? This condition is diagnosed with a physical exam and imaging tests, such as an X-ray or MRI. How is this treated? This condition may be treated with:  Treatment of the underlying cause or condition.  Physical therapy. This involves performing exercises to get the shoulder moving again.  Medicine. Medicine may be given to relieve pain, inflammation, or muscle spasms.  Steroid injections into the shoulder joint.  Shoulder manipulation. This is a procedure to move the shoulder into another position. It is done after you are given a medicine to make you fall asleep (general anesthetic). The joint may also be injected with salt water at high pressure to break down scarring.  Surgery. This may be done in severe cases when other treatments have failed.  Although most  people recover completely from adhesive capsulitis, some may not regain the full movement of the shoulder. Follow these instructions at home:  Take over-the-counter and prescription medicines only as told by your health care provider.  If you are being treated with physical therapy, follow instructions from your physical therapist.  Avoid exercises that put a lot of demand on your shoulder, such as throwing. These exercises can make pain worse.  If directed, apply ice to the injured area: ? Put ice in a plastic bag. ? Place a towel between your skin and the bag. ? Leave the ice on for 20 minutes, 2-3 times per day. Contact a health care provider if:  You develop new symptoms.  Your symptoms get worse. This information is not intended to replace advice given to you by your health care provider. Make sure you discuss any questions you have with your health care provider. Document Released: 05/02/2009 Document Revised: 12/11/2015 Document Reviewed: 10/28/2014 Elsevier Interactive Patient Education  2018 Elsevier Inc.  

## 2017-10-30 NOTE — Patient Instructions (Signed)
Follow up as scheduled.  

## 2017-11-01 ENCOUNTER — Ambulatory Visit: Payer: BLUE CROSS/BLUE SHIELD | Admitting: Physical Therapy

## 2017-11-03 ENCOUNTER — Ambulatory Visit: Payer: BLUE CROSS/BLUE SHIELD | Admitting: Physical Therapy

## 2017-11-08 ENCOUNTER — Encounter: Payer: BLUE CROSS/BLUE SHIELD | Admitting: Physical Therapy

## 2017-11-11 ENCOUNTER — Encounter: Payer: BLUE CROSS/BLUE SHIELD | Admitting: Physical Therapy

## 2017-11-15 ENCOUNTER — Encounter: Payer: BLUE CROSS/BLUE SHIELD | Admitting: Physical Therapy

## 2017-11-22 ENCOUNTER — Other Ambulatory Visit: Payer: Self-pay | Admitting: Family Medicine

## 2017-11-22 MED ORDER — LISINOPRIL 10 MG PO TABS: 10.0000 mg | ORAL_TABLET | Freq: Every day | ORAL | 3 refills | Status: DC

## 2017-11-22 NOTE — Telephone Encounter (Signed)
Copied from [place] [id number]. Topic: Quick Communication - Rx Refill/Question >> Nov 22, 2017  8:23 AM Synthia Innocent wrote: Medication: lisinopril (PRINIVIL,ZESTRIL) 10 MG tablet  Has the patient contacted their pharmacy? Yes.   (Agent: If no, request that the patient contact the pharmacy for the refill.) Preferred Pharmacy (with phone number or street name): [facility] on [address] Agent: Please be advised that RX refills may take up to 3 business days. We ask that you follow-up with your pharmacy.

## 2017-11-22 NOTE — Telephone Encounter (Signed)
Provider  Merrie Roof, PA  Refill for lisinopril 10 mg  LR 11/24/16 for #90 tabs   Prescription will expire on 11/24/17  LOV  10/27/17 NOV  02/21/18 Med pended

## 2017-11-28 ENCOUNTER — Encounter (INDEPENDENT_AMBULATORY_CARE_PROVIDER_SITE_OTHER): Payer: Self-pay | Admitting: Orthopaedic Surgery

## 2017-11-28 ENCOUNTER — Ambulatory Visit (INDEPENDENT_AMBULATORY_CARE_PROVIDER_SITE_OTHER): Payer: BLUE CROSS/BLUE SHIELD | Admitting: Orthopaedic Surgery

## 2017-11-28 DIAGNOSIS — M7501 Adhesive capsulitis of right shoulder: Secondary | ICD-10-CM

## 2017-11-28 NOTE — Progress Notes (Signed)
Office Visit Note   Patient: Keith Barber           Date of Birth: [birth year]           MRN: [id number] Visit Date: 11/28/2017              Requested by: Volney American, PA-C [place], [place] [address] PCP: Volney American, [place]   Assessment & Plan: Visit Diagnoses:  1. Adhesive capsulitis of right shoulder     Plan: 4 months status post left shoulder arthroscopic SCD, DCR, debridement of partial rotator cuff tear and repair of the anterior labral tear.  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  Discussion with Jiovany regarding the procedure, time out of work and need for physical therapy postmanipulation  Follow-Up Instructions: Return will schedule manipulation.   Orders:  No orders of the defined types were placed in this encounter.  No orders of the defined types were placed in this encounter.     Procedures: No procedures performed   Clinical Data: No additional findings.   Subjective: No chief complaint on file. 4 months status post arthroscopic SCD, DCR, labral repair and debridement of a partial rotator cuff tear.  Has developed adhesive capsulitis despite course of physical therapy sizes.  Still having limitation of activities and some pain on extremes of motion  HPI  Review of Systems   Objective: Vital Signs: There were no vitals taken for this visit.  Physical Exam  Ortho Exam awake alert and oriented x3.  Comfortable sitting.  Range of motion left shoulder with only about 15 degrees of external rotation compared to about 50-60 on the opposite side.  Lacks about 30 to 40 degrees of full overhead motion.  Abduction 90 degrees.  Incisions of healed without problem.  Neurovascular exam intact  Specialty Comments:  No specialty comments available.  Imaging: No results found.   PMFS History: Patient Active Problem List   Diagnosis Date Noted  . Impingement syndrome of left shoulder 07/05/2017  .  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  . Umbilical hernia without obstruction and without gangrene   . Bilateral inguinal hernia without obstruction or gangrene   . History of tobacco abuse 11/26/2016  . Essential hypertension   . Hyperlipidemia   . Migraines   . Rotator cuff tendinitis, left 11/12/2015  . Labral tear of shoulder, left, initial encounter 11/12/2015   Past Medical History:  Diagnosis Date  . Bone spur    LEFT SHOULDER THAT MAKES HIS LEFT ARM TINGLE  . GERD (gastroesophageal reflux disease)   . Hyperlipidemia   . Hypertension   . Migraines    rare migraines    Family History  Problem Relation Age of Onset  . Diabetes Mother   . Migraines Mother   . Cancer Neg Hx   . COPD Neg Hx   . Heart disease Neg Hx   . Stroke Neg Hx     Past Surgical History:  Procedure Laterality Date  . INGUINAL HERNIA REPAIR Bilateral 01/21/2017   Procedure: LAPAROSCOPIC BILATERAL INGUINAL HERNIA REPAIR;  Surgeon: Jules Husbands, MD;  Location: [facility];  Service: General;  Laterality: Bilateral;  . KNEE SURGERY Left   . SHOULDER ARTHROSCOPY WITH DISTAL CLAVICLE RESECTION Left 07/05/2017   Procedure: LEFT SHOULDER ARTHROSCOPY, SUBACROMIAL DECOMPRESSION, DISTAL CLAVICLE RESECTION, POSSIBLE MINI OPEN ROTATOR CUFF REPAIR;  Surgeon: [name], MD;  Location: [facility];  Service: Orthopedics;  Laterality: Left;  . SHOULDER  SURGERY Right   . UMBILICAL HERNIA REPAIR N/A 01/21/2017   Procedure: HERNIA REPAIR UMBILICAL ADULT;  Surgeon: Jules Husbands, MD;  Location: [facility];  Service: General;  Laterality: N/A;   Social History   Occupational History  . Not on file  Tobacco Use  . Smoking status: Former Smoker    Packs/day: 0.50    Years: 26.00    Pack years: 13.00    Types: Cigarettes    Last attempt to quit: 01/13/2017    Years since quitting: 0.8  . Smokeless tobacco: Never Used  . Tobacco comment: PT STARTED TAKING ZYBAN ON 01-08-17 IN HOPES OF QUITTING    Substance and Sexual Activity  . Alcohol use: No  . Drug use: No  . Sexual activity: Not on file     [name], MD   Note - This record has been created using [employer].  Chart creation errors have been sought, but may not always  have been located. Such creation errors do not reflect on  the standard of medical care.

## 2017-11-29 ENCOUNTER — Other Ambulatory Visit: Payer: Self-pay | Admitting: Family Medicine

## 2017-11-30 ENCOUNTER — Telehealth (INDEPENDENT_AMBULATORY_CARE_PROVIDER_SITE_OTHER): Payer: Self-pay | Admitting: Orthopaedic Surgery

## 2017-11-30 NOTE — Telephone Encounter (Signed)
fyi

## 2017-11-30 NOTE — Telephone Encounter (Signed)
FYI:    Patient's surgery is scheduled at [facility] because he is unable to schedule at [facility] until his old balance with them has been settled.  He is on the schedule at [facility] on Dec 13, 2017 @ 10:09am  (following a more complex surgical case)

## 2017-12-01 NOTE — Telephone Encounter (Signed)
FYI

## 2017-12-01 NOTE — Telephone Encounter (Signed)
thanks

## 2017-12-02 NOTE — H&P (Signed)
Keith Fears, MD   Keith Borg, PA-C [address], [place], [place]  [address]                             [phone]   [place]  Keith Barber MRN:  [id number] DOB/SEX:  03-14-1971/male  CHIEF COMPLAINT:  Painful left shoulder and loss of motion  HISTORY: Mr Keith Barber is 4 months status post left shoulder arthroscopic SAD, DCR, debridement of partial rotator cuff tear and repair of the anterior labral tear.  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  Because of continued loss of motion and failure to regain the motion a discussion was had and plan for manipulation   PAST MEDICAL HISTORY: Patient Active Problem List   Diagnosis Date Noted  . Impingement syndrome of left shoulder 07/05/2017  . 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  . Umbilical hernia without obstruction and without gangrene   . Bilateral inguinal hernia without obstruction or gangrene   . History of tobacco abuse 11/26/2016  . Essential hypertension   . Hyperlipidemia   . Migraines   . Rotator cuff tendinitis, left 11/12/2015  . Labral tear of shoulder, left, initial encounter 11/12/2015   Past Medical History:  Diagnosis Date  . Bone spur    LEFT SHOULDER THAT MAKES HIS LEFT ARM TINGLE  . GERD (gastroesophageal reflux disease)   . Hyperlipidemia   . Hypertension   . Migraines    rare migraines   Past Surgical History:  Procedure Laterality Date  . INGUINAL HERNIA REPAIR Bilateral 01/21/2017   Procedure: LAPAROSCOPIC BILATERAL INGUINAL HERNIA REPAIR;  Surgeon: Jules Husbands, MD;  Location: [facility];  Service: General;  Laterality: Bilateral;  . KNEE SURGERY Left   . SHOULDER ARTHROSCOPY WITH DISTAL CLAVICLE RESECTION Left 07/05/2017   Procedure: LEFT SHOULDER ARTHROSCOPY, SUBACROMIAL DECOMPRESSION, DISTAL CLAVICLE RESECTION, POSSIBLE MINI OPEN ROTATOR CUFF REPAIR;  Surgeon: [name], MD;  Location: [facility];   Service: Orthopedics;  Laterality: Left;  . SHOULDER SURGERY Right   . UMBILICAL HERNIA REPAIR N/A 01/21/2017   Procedure: HERNIA REPAIR UMBILICAL ADULT;  Surgeon: Jules Husbands, MD;  Location: [facility];  Service: General;  Laterality: N/A;     MEDICATIONS:   No medications prior to admission.   No current facility-administered medications for this encounter.    Current Outpatient Medications  Medication Sig Dispense Refill  . acetaminophen (TYLENOL) 500 MG tablet Take 1,000 mg by mouth every 6 (six) hours as needed for moderate pain or headache.    . budesonide-formoterol (SYMBICORT) 160-4.5 MCG/ACT inhaler Inhale 2 puffs into the lungs 2 (two) times daily. 1 Inhaler 11  . calcium carbonate (TUMS EX) 750 MG chewable tablet Chew 2 tablets by mouth daily as needed for heartburn.    Marland Kitchen FIBER PO Take 1 capsule by mouth daily.    Marland Kitchen ibuprofen (ADVIL,MOTRIN) 200 MG tablet Take 600 mg by mouth every 8 (eight) hours as needed for headache or moderate pain.     Marland Kitchen lisinopril (PRINIVIL,ZESTRIL) 10 MG tablet Take 1 tablet (10 mg total) by mouth daily. 90 tablet 3  . loperamide (IMODIUM) 2 MG capsule Take 2-4 mg by mouth as needed for diarrhea or loose stools.    . naproxen sodium (ALEVE) 220 MG tablet Take 440 mg by mouth daily as needed (for pain or headache).     . ondansetron (ZOFRAN ODT) 4 MG disintegrating tablet Take  1 tablet (4 mg total) by mouth every 8 (eight) hours as needed for nausea or vomiting. 20 tablet 0  . ranitidine (ZANTAC) 75 MG tablet Take 75 mg by mouth daily.     . rizatriptan (MAXALT) 10 MG tablet Take 1 tablet (10 mg total) by mouth as needed for migraine. 10 tablet 3  . rosuvastatin (CRESTOR) 10 MG tablet TAKE 1 TABLET BY MOUTH ONCE DAILY 90 tablet 1  . dicyclomine (BENTYL) 10 MG capsule Take 1 capsule (10 mg total) by mouth 4 (four) times daily -  before meals and at bedtime. (Patient not taking: Reported on 10/27/2017) 60 capsule 1  . fluticasone (FLONASE) 50 MCG/ACT nasal  spray Place 2 sprays into both nostrils 2 (two) times daily. (Patient not taking: Reported on 10/27/2017) 16 g 6  . sucralfate (CARAFATE) 1 g tablet Take 1 tablet (1 g total) by mouth 4 (four) times daily -  with meals and at bedtime. (Patient not taking: Reported on 12/01/2017) 120 tablet 1  . traMADol (ULTRAM) 50 MG tablet Take 1 tablet (50 mg total) by mouth every 8 (eight) hours as needed. (Patient not taking: Reported on 12/01/2017) 20 tablet 0     ALLERGIES:  No Known Allergies  REVIEW OF SYSTEMS:  Review of Systems  Constitutional: Negative for fatigue.  HENT: Negative for hearing loss.   Respiratory: Negative for apnea, chest tightness and shortness of breath.   Cardiovascular: Negative for chest pain, palpitations and leg swelling.  Gastrointestinal: Negative for blood in stool, constipation and diarrhea.  Genitourinary: Negative for difficulty urinating.  Musculoskeletal: Positive for neck stiffness. Negative for arthralgias, back pain, joint swelling, myalgias and neck pain.  Neurological: Negative for weakness, numbness and headaches.  Hematological: Does not bruise/bleed easily.  Psychiatric/Behavioral: Positive for sleep disturbance. The patient is not nervous/anxious.    FAMILY HISTORY:   Family History  Problem Relation Age of Onset  . Diabetes Mother   . Migraines Mother   . Cancer Neg Hx   . COPD Neg Hx   . Heart disease Neg Hx   . Stroke Neg Hx     SOCIAL HISTORY:   Social History   Tobacco Use  . Smoking status: Former Smoker    Packs/day: 0.50    Years: 26.00    Pack years: 13.00    Types: Cigarettes    Last attempt to quit: 01/13/2017    Years since quitting: 0.8  . Smokeless tobacco: Never Used  . Tobacco comment: PT STARTED TAKING ZYBAN ON 01-08-17 IN HOPES OF QUITTING  Substance Use Topics  . Alcohol use: No      EXAMINATION: Vital Signs: BP 110/85 (BP Location: Right Arm, Patient Position: Sitting, Cuff Size: Normal)   Pulse (!) 100   Resp  18   Ht 5\' 8"  (1.727 m)   Wt 207 lb (93.9 kg)   BMI 31.47 kg/m      Head is normocephalic.   Eyes:  Pupils equal, round and reactive to light and accommodation.  Extraocular intact. ENT: Ears, nose, and throat were benign.   Neck: supple, no bruits were noted.   Chest: good expansion.   Lungs: essentially clear.   Cardiac: regular rhythm and rate, normal S1, S2.  No murmurs appreciated. Pulses :  2+ bilateral and symmetric in upper extremities. Abdomen is scaphoid, soft, nontender, no masses palpable, normal bowel sounds present. CNS:  He is oriented x3 and cranial nerves II-XII grossly intact. Breast, rectal, and genital exams: not performed and  not indicated for an orthopedic evaluation. MusculoRange of motion left shoulder with only about 15 degrees of external rotation compared to about 50-60 on the opposite side.  Lacks about 30 to 40 degrees of full overhead motion.  Abduction 90 degrees. Incisions healed       ASSESSMENT: left shoulder post op arthrofibrosis  Past Medical History:  Diagnosis Date  . Bone spur    LEFT SHOULDER THAT MAKES HIS LEFT ARM TINGLE  . GERD (gastroesophageal reflux disease)   . Hyperlipidemia   . Hypertension   . Migraines    rare migraines    PLAN: Plan for left shoulder manipulation  The procedure,  risks, and benefits of surgery were presented and reviewed. The risks including but not limited to infection, blood clots, vascular and nerve injury, stiffness,  among others were discussed. The patient acknowledged the explanation, agreed to proceed.   Mike Craze [place], [place] [phone]  12/02/2017 1:22 PM

## 2017-12-07 NOTE — Pre-Procedure Instructions (Addendum)
Tesean Stump  12/07/2017      [facility] [address]), [place] [address] [place] ([place]) [place] [address] Phone: [phone] Fax: [phone]    Your procedure is scheduled on May 28  Report to [facility] Admitting at 0800 A.M.  Call this number if you have problems the morning of surgery:  [phone]   Remember:  NOTHING TO EAT OR DRINK AFTER MIDNIGHT    Take these medicines the morning of surgery with A SIP OF WATER  acetaminophen (TYLENOL) budesonide-formoterol (SYMBICORT) ranitidine (ZANTAC)  7 days prior to surgery STOP taking any Aspirin(unless otherwise instructed by your surgeon), Aleve, Naproxen, Ibuprofen, Motrin, Advil, Goody's, BC's, all herbal medications, fish oil, and all vitamins   Do not wear jewelry  Do not wear lotions, powders, or cologne, or deodorant.  Men may shave face and neck.  BRUSH TEETH THE MORNING OF SURGERY  Do not bring valuables to the hospital.  [facility] is not responsible for any belongings or valuables.  Contacts, dentures or bridgework may not be worn into surgery.  Leave your suitcase in the car.  After surgery it may be brought to your room.  For patients admitted to the hospital, discharge time will be determined by your treatment team.  Patients discharged the day of surgery will not be allowed to drive home.    Special instructions:   [facility]- Preparing For Surgery  Before surgery, you can play an important role. Because skin is not sterile, your skin needs to be as free of germs as possible. You can reduce the number of germs on your skin by washing with CHG (chlorahexidine gluconate) Soap before surgery.  CHG is an antiseptic cleaner which kills germs and bonds with the skin to continue killing germs even after washing.    Oral Hygiene is also important to reduce your risk of infection.  Remember - BRUSH YOUR TEETH THE MORNING OF SURGERY WITH YOUR  REGULAR TOOTHPASTE  Please do not use if you have an allergy to CHG or antibacterial soaps. If your skin becomes reddened/irritated stop using the CHG.  Do not shave (including legs and underarms) for at least 48 hours prior to first CHG shower. It is OK to shave your face.  Please follow these instructions carefully.   1. Shower the NIGHT BEFORE SURGERY and the MORNING OF SURGERY with CHG.   2. If you chose to wash your hair, wash your hair first as usual with your normal shampoo.  3. After you shampoo, rinse your hair and body thoroughly to remove the shampoo.  4. Use CHG as you would any other liquid soap. You can apply CHG directly to the skin and wash gently with a scrungie or a clean washcloth.   5. Apply the CHG Soap to your body ONLY FROM THE NECK DOWN.  Do not use on open wounds or open sores. Avoid contact with your eyes, ears, mouth and genitals (private parts). Wash Face and genitals (private parts)  with your normal soap.  6. Wash thoroughly, paying special attention to the area where your surgery will be performed.  7. Thoroughly rinse your body with warm water from the neck down.  8. DO NOT shower/wash with your normal soap after using and rinsing off the CHG Soap.  9. Pat yourself dry with a CLEAN TOWEL.  10. Wear CLEAN PAJAMAS to bed the night before surgery, wear comfortable clothes the morning of surgery  11. Place CLEAN SHEETS on your bed the night of your first shower and DO NOT SLEEP WITH PETS.    Day of Surgery:  Do not apply any deodorants/lotions.  Please wear clean clothes to the hospital/surgery center.   Remember to brush your teeth WITH YOUR REGULAR TOOTHPASTE.    Please read over the following fact sheets that you were given.

## 2017-12-08 ENCOUNTER — Encounter (HOSPITAL_COMMUNITY): Payer: Self-pay

## 2017-12-08 ENCOUNTER — Other Ambulatory Visit: Payer: Self-pay

## 2017-12-08 ENCOUNTER — Encounter (HOSPITAL_COMMUNITY)
Admission: RE | Admit: 2017-12-08 | Discharge: 2017-12-08 | Disposition: A | Discharge status: Home / Self Care | Payer: BLUE CROSS/BLUE SHIELD | Source: Ambulatory Visit | Attending: Orthopaedic Surgery | Admitting: Orthopaedic Surgery

## 2017-12-08 DIAGNOSIS — Z01812 Encounter for preprocedural laboratory examination: Secondary | ICD-10-CM | POA: Diagnosis not present

## 2017-12-08 LAB — CBC
HEMATOCRIT: 53 % — AB (ref 39.0–52.0)
HEMOGLOBIN: 17.6 g/dL — AB (ref 13.0–17.0)
MCH: 29.3 pg (ref 26.0–34.0)
MCHC: 33.2 g/dL (ref 30.0–36.0)
MCV: 88.3 fL (ref 78.0–100.0)
Platelets: 201 10*3/uL (ref 150–400)
RBC: 6 MIL/uL — AB (ref 4.22–5.81)
RDW: 12.5 % (ref 11.5–15.5)
WBC: 8.4 10*3/uL (ref 4.0–10.5)

## 2017-12-08 LAB — BASIC METABOLIC PANEL
Anion gap: 9 (ref 5–15)
BUN: 11 mg/dL (ref 6–20)
CHLORIDE: 107 mmol/L (ref 101–111)
CO2: 22 mmol/L (ref 22–32)
Calcium: 8.6 mg/dL — ABNORMAL LOW (ref 8.9–10.3)
Creatinine, Ser: 1.07 mg/dL (ref 0.61–1.24)
GFR calc non Af Amer: >60 mL/min (ref >60)
Glucose, Bld: 176 mg/dL — ABNORMAL HIGH (ref 65–99)
POTASSIUM: 4.2 mmol/L (ref 3.5–5.1)
SODIUM: 138 mmol/L (ref 135–145)

## 2017-12-08 NOTE — Progress Notes (Signed)
PCP - Merrie Roof Cardiologist - denies  Chest x-ray - not needed EKG - 01/2017 Stress Test -denies  ECHO - denies Cardiac Cath - denies   Anesthesia review: NO  Patient denies shortness of breath, fever, cough and chest pain at PAT appointment   Patient verbalized understanding of instructions that were given to them at the PAT appointment. Patient was also instructed that they will need to review over the PAT instructions again at home before surgery.

## 2017-12-12 NOTE — Anesthesia Preprocedure Evaluation (Addendum)
Anesthesia Evaluation  Patient identified by MRN, date of birth, ID band Patient awake    Reviewed: Allergy & Precautions, NPO status , Patient's Chart, lab work & pertinent test results  Airway Mallampati: I  TM Distance: >3 FB Neck ROM: Full    Dental no notable dental hx. (+) Teeth Intact, Dental Advisory Given   Pulmonary neg pulmonary ROS, former smoker,    Pulmonary exam normal breath sounds clear to auscultation       Cardiovascular Exercise Tolerance: Good hypertension, Pt. on medications Normal cardiovascular exam Rhythm:Regular Rate:Normal     Neuro/Psych negative psych ROS   GI/Hepatic Neg liver ROS, GERD  Medicated and Controlled,  Endo/Other  Morbid obesity  Renal/GU negative Renal ROS     Musculoskeletal   Abdominal (+) + obese,   Peds  Hematology   Anesthesia Other Findings   Reproductive/Obstetrics                             Anesthesia Physical Anesthesia Plan  ASA: II  Anesthesia Plan: General and Regional   Post-op Pain Management:  Regional for Post-op pain   Induction: Intravenous  PONV Risk Score and Plan:   Airway Management Planned: Mask  Additional Equipment:   Intra-op Plan:   Post-operative Plan:   Informed Consent: I have reviewed the patients History and Physical, chart, labs and discussed the procedure including the risks, benefits and alternatives for the proposed anesthesia with the patient or authorized representative who has indicated his/her understanding and acceptance.     Plan Discussed with: CRNA  Anesthesia Plan Comments:         Anesthesia Quick Evaluation

## 2017-12-13 ENCOUNTER — Ambulatory Visit (HOSPITAL_COMMUNITY): Payer: BLUE CROSS/BLUE SHIELD | Admitting: Anesthesiology

## 2017-12-13 ENCOUNTER — Encounter (HOSPITAL_COMMUNITY)
Admission: RE | Disposition: A | Discharge status: Home / Self Care | Payer: Self-pay | Source: Ambulatory Visit | Attending: Orthopaedic Surgery

## 2017-12-13 ENCOUNTER — Encounter (HOSPITAL_COMMUNITY): Payer: Self-pay | Admitting: Certified Registered Nurse Anesthetist

## 2017-12-13 ENCOUNTER — Ambulatory Visit (HOSPITAL_COMMUNITY)
Admission: RE | Admit: 2017-12-13 | Discharge: 2017-12-13 | Disposition: A | Discharge status: Home / Self Care | Payer: BLUE CROSS/BLUE SHIELD | Source: Ambulatory Visit | Attending: Orthopaedic Surgery | Admitting: Orthopaedic Surgery

## 2017-12-13 DIAGNOSIS — Z79899 Other long term (current) drug therapy: Secondary | ICD-10-CM | POA: Insufficient documentation

## 2017-12-13 DIAGNOSIS — Z87891 Personal history of nicotine dependence: Secondary | ICD-10-CM | POA: Diagnosis not present

## 2017-12-13 DIAGNOSIS — G43909 Migraine, unspecified, not intractable, without status migrainosus: Secondary | ICD-10-CM | POA: Insufficient documentation

## 2017-12-13 DIAGNOSIS — E785 Hyperlipidemia, unspecified: Secondary | ICD-10-CM | POA: Diagnosis not present

## 2017-12-13 DIAGNOSIS — I10 Essential (primary) hypertension: Secondary | ICD-10-CM | POA: Insufficient documentation

## 2017-12-13 DIAGNOSIS — M7502 Adhesive capsulitis of left shoulder: Secondary | ICD-10-CM | POA: Insufficient documentation

## 2017-12-13 DIAGNOSIS — Z791 Long term (current) use of non-steroidal anti-inflammatories (NSAID): Secondary | ICD-10-CM | POA: Insufficient documentation

## 2017-12-13 DIAGNOSIS — Z6831 Body mass index (BMI) 31.0-31.9, adult: Secondary | ICD-10-CM | POA: Diagnosis not present

## 2017-12-13 DIAGNOSIS — K219 Gastro-esophageal reflux disease without esophagitis: Secondary | ICD-10-CM | POA: Insufficient documentation

## 2017-12-13 HISTORY — PX: SHOULDER CLOSED REDUCTION: SHX1051

## 2017-12-13 SURGERY — MANIPULATION, JOINT, SHOULDER, WITH ANESTHESIA
Anesthesia: Regional | Laterality: Left

## 2017-12-13 MED ORDER — METHYLPREDNISOLONE ACETATE 80 MG/ML IJ SUSP
INTRAMUSCULAR | Status: DC | PRN
  Administered 2017-12-13: 80 mg via INTRA_ARTICULAR

## 2017-12-13 MED ORDER — DEXAMETHASONE SODIUM PHOSPHATE 10 MG/ML IJ SOLN
INTRAMUSCULAR | Status: DC | PRN
  Administered 2017-12-13: 10 mg via INTRAVENOUS

## 2017-12-13 MED ORDER — LIDOCAINE HCL (CARDIAC) PF 100 MG/5ML IV SOSY
PREFILLED_SYRINGE | INTRAVENOUS | Status: DC | PRN
  Administered 2017-12-13: 100 mg via INTRAVENOUS

## 2017-12-13 MED ORDER — BUPIVACAINE-EPINEPHRINE (PF) 0.25% -1:200000 IJ SOLN
INTRAMUSCULAR | Status: AC
  Filled 2017-12-13: qty 30

## 2017-12-13 MED ORDER — OXYCODONE-ACETAMINOPHEN 5-325 MG PO TABS: 1.0000 | ORAL_TABLET | ORAL | 0 refills | Status: DC | PRN

## 2017-12-13 MED ORDER — MEPERIDINE HCL 50 MG/ML IJ SOLN: 6.2500 mg | INTRAMUSCULAR | Status: DC | PRN

## 2017-12-13 MED ORDER — LACTATED RINGERS IV SOLN
INTRAVENOUS | Status: DC
  Administered 2017-12-13: 09:00:00 via INTRAVENOUS

## 2017-12-13 MED ORDER — OXYCODONE-ACETAMINOPHEN 5-325 MG PO TABS: 1.0000 | ORAL_TABLET | ORAL | Status: DC | PRN

## 2017-12-13 MED ORDER — FENTANYL CITRATE (PF) 100 MCG/2ML IJ SOLN
INTRAMUSCULAR | Status: AC
  Administered 2017-12-13: 100 ug
  Filled 2017-12-13: qty 2

## 2017-12-13 MED ORDER — ROPIVACAINE HCL 5 MG/ML IJ SOLN
INTRAMUSCULAR | Status: DC | PRN
  Administered 2017-12-13: 30 mL via PERINEURAL

## 2017-12-13 MED ORDER — ACETAMINOPHEN 10 MG/ML IV SOLN: 1000.0000 mg | Freq: Once | INTRAVENOUS | Status: DC | PRN

## 2017-12-13 MED ORDER — PROMETHAZINE HCL 25 MG/ML IJ SOLN: 6.2500 mg | INTRAMUSCULAR | Status: DC | PRN

## 2017-12-13 MED ORDER — MIDAZOLAM HCL 2 MG/2ML IJ SOLN
INTRAMUSCULAR | Status: AC
  Filled 2017-12-13: qty 2

## 2017-12-13 MED ORDER — ONDANSETRON HCL 4 MG/2ML IJ SOLN
INTRAMUSCULAR | Status: DC | PRN
Start: 2017-12-13 — End: 2017-12-13
  Administered 2017-12-13: 4 mg via INTRAVENOUS

## 2017-12-13 MED ORDER — BUPIVACAINE-EPINEPHRINE (PF) 0.25% -1:200000 IJ SOLN
INTRAMUSCULAR | Status: DC | PRN
  Administered 2017-12-13: 4 mL via PERINEURAL

## 2017-12-13 MED ORDER — METHYLPREDNISOLONE ACETATE 80 MG/ML IJ SUSP
INTRAMUSCULAR | Status: AC
  Filled 2017-12-13: qty 1

## 2017-12-13 MED ORDER — PROPOFOL 10 MG/ML IV BOLUS
INTRAVENOUS | Status: AC
  Filled 2017-12-13: qty 20

## 2017-12-13 MED ORDER — SODIUM CHLORIDE 0.9 % IV SOLN: INTRAVENOUS | Status: DC

## 2017-12-13 MED ORDER — PROPOFOL 10 MG/ML IV BOLUS
INTRAVENOUS | Status: DC | PRN
  Administered 2017-12-13: 120 mg via INTRAVENOUS

## 2017-12-13 MED ORDER — FENTANYL CITRATE (PF) 250 MCG/5ML IJ SOLN
INTRAMUSCULAR | Status: AC
  Filled 2017-12-13: qty 5

## 2017-12-13 MED ORDER — MIDAZOLAM HCL 2 MG/2ML IJ SOLN
INTRAMUSCULAR | Status: AC
  Administered 2017-12-13: 2 mg
  Filled 2017-12-13: qty 2

## 2017-12-13 MED ORDER — HYDROMORPHONE HCL 2 MG/ML IJ SOLN: 0.2500 mg | INTRAMUSCULAR | Status: DC | PRN

## 2017-12-13 MED ORDER — HYDROCODONE-ACETAMINOPHEN 7.5-325 MG PO TABS: 1.0000 | ORAL_TABLET | Freq: Once | ORAL | Status: DC | PRN

## 2017-12-13 SURGICAL SUPPLY — 8 items
BNDG COHESIVE 4X5 WHT NS (GAUZE/BANDAGES/DRESSINGS) ×1 IMPLANT
GLOVE BIOGEL PI IND STRL 8 (GLOVE) ×1 IMPLANT
GLOVE BIOGEL PI INDICATOR 8 (GLOVE) ×1
GLOVE ECLIPSE 8.0 STRL XLNG CF (GLOVE) ×2 IMPLANT
KIT TURNOVER KIT B (KITS) ×2 IMPLANT
NEEDLE 22X1 1/2 (OR ONLY) (NEEDLE) ×1 IMPLANT
PAD ARMBOARD 7.5X6 YLW CONV (MISCELLANEOUS) ×3 IMPLANT
SYR CONTROL 10ML LL (SYRINGE) ×1 IMPLANT

## 2017-12-13 NOTE — Discharge Instructions (Signed)
Sling left arm for comfort. Active range of motion left arm when block wears off.

## 2017-12-13 NOTE — Op Note (Signed)
PATIENT ID:      Keith Barber  MRN:     [id number] DOB/AGE:    1971/06/15 / 47 y.o.       OPERATIVE REPORT    DATE OF PROCEDURE:  12/13/2017       PREOPERATIVE DIAGNOSIS:   LEFT SHOULDER ADHESIVE CAPSULITIS                                                       Estimated body mass index is 31.78 kg/m as calculated from the following:   Height as of 12/08/17: 5\' 8"  (1.727 m).   Weight as of this encounter: 209 lb (94.8 kg).     POSTOPERATIVE DIAGNOSIS:   LEFT SHOULDER ADHESIVE CAPSULITIS                                                                     Estimated body mass index is 31.78 kg/m as calculated from the following:   Height as of 12/08/17: 5\' 8"  (1.727 m).   Weight as of this encounter: 209 lb (94.8 kg).     PROCEDURE:  Procedure(s): LEFT SHOULDER MANIPULATION with steroid injection     SURGEON:  Joni Fears, MD    ASSISTANT:   Biagio Borg, PA-C   (Present and scrubbed throughout the case, critical for assistance with exposure, retraction, instrumentation, and closure.)          ANESTHESIA: regional and general     DRAINS: none :      TOURNIQUET TIME: * No tourniquets in log *    COMPLICATIONS:  None   CONDITION:  stable  PROCEDURE IN DETAIL:    [name] 12/13/2017, 10:11 AM

## 2017-12-13 NOTE — Op Note (Signed)
NAMEMYRTLE, BARNHARD MEDICAL RECORD WK:[id number] ACCOUNT [id number] DATE OF BIRTH:[birth year] FACILITY: [facility] LOCATION: [place], [place]  OPERATIVE REPORT  DATE OF PROCEDURE:  12/13/2017  PREOPERATIVE DIAGNOSIS:  Adhesive capsulitis, left shoulder.  POSTOPERATIVE DIAGNOSIS:  Adhesive capsulitis, left shoulder.  PROCEDURE:  Closed manipulation, left shoulder.  SURGEON:  Joni Fears, MD  ASSISTANT:  None.  COMPLICATIONS:  None.  ANESTHESIA:  General with interscalene nerve block.  DESCRIPTION OF PROCEDURE:  The patient was met in the holding area, identified the left shoulder as the appropriate operative site and marked it accordingly.  Anesthesia had performed an interscalene nerve block.  The patient was then transported to room #7 and while on the operating room stretcher, placed under general mask anesthesia.  A time-out was called.  Gentle manipulation of the left shoulder was performed with obvious lysis of adhesions.  Manipulation  was performed in flexion, abduction, internal and external rotation.  There was obvious lysis of adhesions to the point where I had symmetrical range of motion of the left compared to the right shoulder.  I prepped the anterior aspect of his shoulder with alcohol, Betadine, and injected 4 mL of 2% Xylocaine with epinephrine and 80 mg Depo-Medrol into the glenohumeral joint.  Band-Aid was applied.  The patient was awoken and returned to the postanesthesia recovery room in satisfactory condition.    PLAN:  Postop therapy, oxycodone for pain, office first of the week.  GN/NUANCE  D:12/13/2017 T:12/13/2017 JOB:[id number]

## 2017-12-13 NOTE — Anesthesia Postprocedure Evaluation (Signed)
Anesthesia Post Note  Patient: Keith Barber  Procedure(s) Performed: LEFT SHOULDER MANIPULATION with steroid injection (Left )     Patient location during evaluation: PACU Anesthesia Type: Regional and General Level of consciousness: awake and alert Pain management: pain level controlled Vital Signs Assessment: post-procedure vital signs reviewed and stable Respiratory status: spontaneous breathing, nonlabored ventilation, respiratory function stable and patient connected to nasal cannula oxygen Cardiovascular status: blood pressure returned to baseline and stable Postop Assessment: no apparent nausea or vomiting Anesthetic complications: no    Last Vitals:  Vitals:   12/13/17 1051 12/13/17 1112  BP: 117/90 (!) 134/97  Pulse: 83 92  Resp: (!) 9 13  Temp: (!) 36.4 C (!) 36.4 C  SpO2: 96% 99%    Last Pain:  Vitals:   12/13/17 1100  TempSrc:   PainSc: 0-No pain                 Barnet Glasgow

## 2017-12-13 NOTE — Anesthesia Procedure Notes (Signed)
Anesthesia Regional Block: Interscalene brachial plexus block   Pre-Anesthetic Checklist: ,, timeout performed, Correct Patient, Correct Site, Correct Laterality, Correct Procedure, Correct Position, site marked, Risks and benefits discussed, at surgeon's request and post-op pain management  Laterality: Upper and Left  Prep: Betadine, chloraprep, alcohol swabs       Needles:  Injection technique: Single-shot  Needle Type: Stimulator Needle - 40     Needle Length: 5cm  Needle Gauge: 22     Additional Needles:   Procedures:, nerve stimulator,,,,,,,  Narrative:  Start time: 12/13/2017 9:00 AM End time: 12/13/2017 9:10 AM  Performed by: Personally  Anesthesiologist: Rica Koyanagi, MD  Additional Notes: Block assessed prior to start of surgery

## 2017-12-13 NOTE — H&P (Signed)
The recent History & Physical has been reviewed. I have personally examined the patient today. There is no interval change to the documented History & Physical. The patient would like to proceed with the procedure.  [name] 12/13/2017,  9:30 AM

## 2017-12-13 NOTE — Anesthesia Procedure Notes (Signed)
Procedure Name: General with mask airway Date/Time: 12/13/2017 10:06 AM Performed by: Candis Shine, CRNA Pre-anesthesia Checklist: Patient identified, Emergency Drugs available, Suction available and Patient being monitored Patient Re-evaluated:Patient Re-evaluated prior to induction Oxygen Delivery Method: Circle system utilized Preoxygenation: Pre-oxygenation with 100% oxygen Induction Type: IV induction Ventilation: Oral airway inserted - appropriate to patient size and Mask ventilation throughout procedure Airway Equipment and Method: Oral airway Dental Injury: Teeth and Oropharynx as per pre-operative assessment

## 2017-12-13 NOTE — Transfer of Care (Signed)
Immediate Anesthesia Transfer of Care Note  Patient: Keith Barber  Procedure(s) Performed: LEFT SHOULDER MANIPULATION with steroid injection (Left )  Patient Location: PACU  Anesthesia Type:GA combined with regional for post-op pain  Level of Consciousness: awake, alert  and oriented  Airway & Oxygen Therapy: Patient Spontanous Breathing and Patient connected to nasal cannula oxygen  Post-op Assessment: Report given to RN and Post -op Vital signs reviewed and stable  Post vital signs: Reviewed and stable  Last Vitals:  Vitals Value Taken Time  BP    Temp    Pulse 97 12/13/2017 10:19 AM  Resp 13 12/13/2017 10:19 AM  SpO2 97 % 12/13/2017 10:19 AM  Vitals shown include unvalidated device data.  Last Pain:  Vitals:   12/13/17 0846  TempSrc: Oral         Complications: No apparent anesthesia complications

## 2017-12-14 ENCOUNTER — Encounter (HOSPITAL_COMMUNITY): Payer: Self-pay | Admitting: Orthopaedic Surgery

## 2017-12-15 ENCOUNTER — Encounter (INDEPENDENT_AMBULATORY_CARE_PROVIDER_SITE_OTHER): Payer: Self-pay | Admitting: Orthopaedic Surgery

## 2017-12-15 ENCOUNTER — Ambulatory Visit (INDEPENDENT_AMBULATORY_CARE_PROVIDER_SITE_OTHER): Payer: BLUE CROSS/BLUE SHIELD | Admitting: Orthopaedic Surgery

## 2017-12-15 VITALS — BP 145/104 | HR 83 | Ht 68.5 in | Wt 209.0 lb

## 2017-12-15 DIAGNOSIS — M7502 Adhesive capsulitis of left shoulder: Secondary | ICD-10-CM

## 2017-12-15 HISTORY — DX: Adhesive capsulitis of left shoulder: M75.02

## 2017-12-15 NOTE — Progress Notes (Signed)
Office Visit Note   Patient: Keith Barber           Date of Birth: October 27, 1970           MRN: [id number] Visit Date: 12/15/2017              Requested by: Volney American, PA-C [place], [place] [id number] PCP: Volney American, PA-C   Assessment & Plan: Visit Diagnoses:  1. Adhesive capsulitis of left shoulder     Plan: 2 days status post closed manipulation of left shoulder for adhesive capsulitis and actually doing well.  We will give him a note to return to work on Monday, 3 June.  We will continue to perform exercises at home.  Return to the office in 3 weeks  Follow-Up Instructions: Return in about 3 weeks (around 01/05/2018).   Orders:  No orders of the defined types were placed in this encounter.  No orders of the defined types were placed in this encounter.     Procedures: No procedures performed   Clinical Data: No additional findings.   Subjective: Chief Complaint  Patient presents with  . Left Shoulder - Pain, Routine Post Op  . Follow-up    12/13/17 L SHOULDER MANIPULATION, LITTLE WEAK AND PAIN WHEN EXTENDING ARM ABOVE HEAD BUT HAS MORE ROM  Feeling much better after manipulation now 48 hours post procedure.  No numbness or tingling.  Much better range of motion.  HPI  Review of Systems  Constitutional: Positive for fatigue. Negative for fever.  HENT: Negative for ear pain.   Eyes: Negative for pain.  Respiratory: Positive for cough. Negative for shortness of breath.   Cardiovascular: Negative for leg swelling.  Gastrointestinal: Negative for constipation and diarrhea.  Genitourinary: Negative for difficulty urinating.  Musculoskeletal: Positive for back pain. Negative for neck pain.  Skin: Negative for rash.  Allergic/Immunologic: Negative for food allergies.  Neurological: Positive for weakness. Negative for numbness.  Hematological: Bruises/bleeds easily.  Psychiatric/Behavioral: Positive for sleep disturbance.      Objective: Vital Signs: BP (!) 145/104 (BP Location: Left Arm, Patient Position: Sitting, Cuff Size: Normal)   Pulse 83   Ht 5' 8.5" (1.74 m)   Wt 209 lb (94.8 kg)   BMI 31.32 kg/m   Physical Exam  Ortho Exam awake alert and oriented x3.  Comfortable sitting.  I can place his left arm fully over his head which is quite significant compared to his pre-manipulation exam.  I could abduct easily to 90 degrees.  Still has a little loss of internal rotation but much better than even preop. Specialty Comments:  No specialty comments available.  Imaging: No results found.   PMFS History: Patient Active Problem List   Diagnosis Date Noted  . Adhesive capsulitis of left shoulder 12/15/2017  . Impingement syndrome of left shoulder 07/05/2017  . 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  . Umbilical hernia without obstruction and without gangrene   . Bilateral inguinal hernia without obstruction or gangrene   . History of tobacco abuse 11/26/2016  . Essential hypertension   . Hyperlipidemia   . Migraines   . Rotator cuff tendinitis, left 11/12/2015  . Labral tear of shoulder, left, initial encounter 11/12/2015   Past Medical History:  Diagnosis Date  . Bone spur    LEFT SHOULDER THAT MAKES HIS LEFT ARM TINGLE - had removed  . GERD (gastroesophageal reflux disease)   . Hyperlipidemia   . Hypertension   . Migraines  rare migraines    Family History  Problem Relation Age of Onset  . Diabetes Mother   . Migraines Mother   . Cancer Neg Hx   . COPD Neg Hx   . Heart disease Neg Hx   . Stroke Neg Hx     Past Surgical History:  Procedure Laterality Date  . INGUINAL HERNIA REPAIR Bilateral 01/21/2017   Procedure: LAPAROSCOPIC BILATERAL INGUINAL HERNIA REPAIR;  Surgeon: Jules Husbands, MD;  Location: [facility];  Service: General;  Laterality: Bilateral;  . KNEE SURGERY Left   . SHOULDER ARTHROSCOPY WITH DISTAL CLAVICLE RESECTION Left 07/05/2017    Procedure: LEFT SHOULDER ARTHROSCOPY, SUBACROMIAL DECOMPRESSION, DISTAL CLAVICLE RESECTION, POSSIBLE MINI OPEN ROTATOR CUFF REPAIR;  Surgeon: [name], MD;  Location: [place];  Service: Orthopedics;  Laterality: Left;  . SHOULDER CLOSED REDUCTION Left 12/13/2017   Procedure: LEFT SHOULDER MANIPULATION with steroid injection;  Surgeon: [name], MD;  Location: [place];  Service: Orthopedics;  Laterality: Left;  . SHOULDER SURGERY Right   . UMBILICAL HERNIA REPAIR N/A 01/21/2017   Procedure: HERNIA REPAIR UMBILICAL ADULT;  Surgeon: Jules Husbands, MD;  Location: [facility];  Service: General;  Laterality: N/A;   Social History   Occupational History  . Not on file  Tobacco Use  . Smoking status: Former Smoker    Packs/day: 0.50    Years: 26.00    Pack years: 13.00    Types: Cigarettes    Last attempt to quit: 01/13/2017    Years since quitting: 0.9  . Smokeless tobacco: Never Used  . Tobacco comment: PT STARTED TAKING ZYBAN ON 01-08-17 IN HOPES OF QUITTING  Substance and Sexual Activity  . Alcohol use: No  . Drug use: No  . Sexual activity: Not on file

## 2017-12-19 ENCOUNTER — Inpatient Hospital Stay (INDEPENDENT_AMBULATORY_CARE_PROVIDER_SITE_OTHER): Payer: BLUE CROSS/BLUE SHIELD | Admitting: Orthopaedic Surgery

## 2017-12-26 ENCOUNTER — Ambulatory Visit (INDEPENDENT_AMBULATORY_CARE_PROVIDER_SITE_OTHER): Payer: BLUE CROSS/BLUE SHIELD | Admitting: Unknown Physician Specialty

## 2017-12-26 ENCOUNTER — Encounter: Payer: Self-pay | Admitting: Unknown Physician Specialty

## 2017-12-26 VITALS — BP 125/88 | HR 87 | Temp 98.5°F | Ht 68.5 in | Wt 206.0 lb

## 2017-12-26 DIAGNOSIS — Z8719 Personal history of other diseases of the digestive system: Secondary | ICD-10-CM | POA: Diagnosis not present

## 2017-12-26 DIAGNOSIS — R053 Chronic cough: Secondary | ICD-10-CM

## 2017-12-26 DIAGNOSIS — B351 Tinea unguium: Secondary | ICD-10-CM | POA: Diagnosis not present

## 2017-12-26 DIAGNOSIS — R05 Cough: Secondary | ICD-10-CM | POA: Diagnosis not present

## 2017-12-26 HISTORY — DX: Personal history of other diseases of the digestive system: Z87.19

## 2017-12-26 HISTORY — DX: Tinea unguium: B35.1

## 2017-12-26 MED ORDER — TERBINAFINE HCL 250 MG PO TABS: 250.0000 mg | ORAL_TABLET | Freq: Every day | ORAL | 0 refills | Status: DC

## 2017-12-26 MED ORDER — OMEPRAZOLE 20 MG PO CPDR: 20.0000 mg | DELAYED_RELEASE_CAPSULE | Freq: Every day | ORAL | 3 refills | Status: DC

## 2017-12-26 NOTE — Assessment & Plan Note (Signed)
Due to history, needs colonoscopy evaluation.

## 2017-12-26 NOTE — Progress Notes (Addendum)
BP 125/88   Pulse 87   Temp 98.5 F (36.9 C) (Oral)   Ht 5' 8.5" (1.74 m)   Wt 206 lb (93.4 kg)   SpO2 95%   BMI 30.87 kg/m    Subjective:    Patient ID: Keith Barber, male    DOB: January 14, 1971, 47 y.o.   MRN: [id number]  HPI: Keith Barber is a 47 y.o. male  Chief Complaint  Patient presents with  . Referral    pt states he thinks he needs a referral to a ENT. States he is still having issues with a chronic cough and some swallowing issues. Also mentioned a referral to a podiatrist for some ingrown and thick toenails and GI for f/up on diverticulitis.    Cough Chronic issue.  Notes from 10/27/2017.  Was treated previously for reflux, Flonase, and Asmanex.  States he feels it is hard to swallow at times.  States it feels it is going down the "wrong way" and starts coughing. States if feels scratchy.  Takes OTC reflux medication which he increased.    Diverticulitis Pt with a history of recent diverticulitis.  Pt has not had a colonoscopy.   Toenail problems Pt describes thick and deformed nails bilateral feet    Family History  Problem Relation Age of Onset  . Diabetes Mother   . Migraines Mother   . Cancer Neg Hx   . COPD Neg Hx   . Heart disease Neg Hx   . Stroke Neg Hx    Relevant past medical, surgical, family and social history reviewed and updated as indicated. Interim medical history since our last visit reviewed. Allergies and medications reviewed and updated.  Review of Systems  Per HPI unless specifically indicated above     Objective:    BP 125/88   Pulse 87   Temp 98.5 F (36.9 C) (Oral)   Ht 5' 8.5" (1.74 m)   Wt 206 lb (93.4 kg)   SpO2 95%   BMI 30.87 kg/m   Wt Readings from Last 3 Encounters:  12/26/17 206 lb (93.4 kg)  12/15/17 209 lb (94.8 kg)  12/13/17 209 lb (94.8 kg)    Physical Exam  Constitutional: He is oriented to person, place, and time. He appears well-developed and well-nourished. No distress.  HENT:  Head: Normocephalic and  atraumatic.  Eyes: Conjunctivae and lids are normal. Right eye exhibits no discharge. Left eye exhibits no discharge. No scleral icterus.  Neck: Normal range of motion. Neck supple. No JVD present. Carotid bruit is not present.  Cardiovascular: Normal rate, regular rhythm and normal heart sounds.  Pulmonary/Chest: Effort normal and breath sounds normal. No respiratory distress.  Abdominal: Normal appearance. There is no splenomegaly or hepatomegaly.  Musculoskeletal: Normal range of motion.  Neurological: He is alert and oriented to person, place, and time.  Skin: Skin is warm, dry and intact. No rash noted. No pallor.  Thickened and malformed nails bilateral feet.  Right worse than left  Psychiatric: He has a normal mood and affect. His behavior is normal. Judgment and thought content normal.    Results for orders placed or performed during the hospital encounter of 12/08/17  CBC  Result Value Ref Range   WBC 8.4 4.0 - 10.5 K/uL   RBC 6.00 (H) 4.22 - 5.81 MIL/uL   Hemoglobin 17.6 (H) 13.0 - 17.0 g/dL   HCT 53.0 (H) 39.0 - 52.0 %   MCV 88.3 78.0 - 100.0 fL   MCH 29.3 26.0 - 34.0  pg   MCHC 33.2 30.0 - 36.0 g/dL   RDW 12.5 11.5 - 15.5 %   Platelets 201 150 - 400 K/uL  Basic metabolic panel  Result Value Ref Range   Sodium 138 135 - 145 mmol/L   Potassium 4.2 3.5 - 5.1 mmol/L   Chloride 107 101 - 111 mmol/L   CO2 22 22 - 32 mmol/L   Glucose, Bld 176 (H) 65 - 99 mg/dL   BUN 11 6 - 20 mg/dL   Creatinine, Ser 1.07 0.61 - 1.24 mg/dL   Calcium 8.6 (L) 8.9 - 10.3 mg/dL   GFR calc non Af Amer >60 >60 mL/min   GFR calc Af Amer >60 >60 mL/min   Anion gap 9 5 - 15      Assessment & Plan:   Problem List Items Addressed This Visit      Unprioritized   Chronic cough    No help with reflux meds, Carafate, inhalers.  Refer to ENT for further evaluation.  Start Omeprazole as just trying OTC reflux meds      Relevant Orders   Ambulatory referral to ENT   History of diverticulitis -  Primary    Due to history, needs colonoscopy evaluation.        Relevant Orders   Ambulatory referral to Gastroenterology   Onychomycosis    Bilateral feet.  Start Lamisil 250 mg daily for 3 months.  Discussed if takes a year for toenails to grow out        Relevant Medications   terbinafine (LAMISIL) 250 MG tablet       Follow up plan: Return for physical in August with Apolonio Schneiders.

## 2017-12-26 NOTE — Addendum Note (Signed)
Addended by: Kathrine Haddock on: 12/26/2017 09:47 AM   Modules accepted: Orders

## 2017-12-26 NOTE — Assessment & Plan Note (Signed)
Bilateral feet.  Start Lamisil 250 mg daily for 3 months.  Discussed if takes a year for toenails to grow out

## 2017-12-26 NOTE — Assessment & Plan Note (Addendum)
No help with reflux meds, Carafate, inhalers.  Refer to ENT for further evaluation.  Start Omeprazole as just trying OTC reflux meds

## 2017-12-29 ENCOUNTER — Encounter: Payer: Self-pay | Admitting: Gastroenterology

## 2017-12-29 ENCOUNTER — Ambulatory Visit (INDEPENDENT_AMBULATORY_CARE_PROVIDER_SITE_OTHER): Payer: BLUE CROSS/BLUE SHIELD | Admitting: Gastroenterology

## 2017-12-29 VITALS — BP 118/82 | HR 88 | Ht 68.5 in | Wt 208.2 lb

## 2017-12-29 DIAGNOSIS — R748 Abnormal levels of other serum enzymes: Secondary | ICD-10-CM | POA: Diagnosis not present

## 2017-12-29 DIAGNOSIS — K219 Gastro-esophageal reflux disease without esophagitis: Secondary | ICD-10-CM

## 2017-12-29 DIAGNOSIS — K760 Fatty (change of) liver, not elsewhere classified: Secondary | ICD-10-CM

## 2017-12-29 DIAGNOSIS — K5732 Diverticulitis of large intestine without perforation or abscess without bleeding: Secondary | ICD-10-CM

## 2017-12-29 NOTE — Patient Instructions (Signed)
F/U 6 months Bed wedge  High-Fiber Diet Fiber, also called dietary fiber, is a type of carbohydrate found in fruits, vegetables, whole grains, and beans. A high-fiber diet can have many health benefits. Your health care provider may recommend a high-fiber diet to help:  Prevent constipation. Fiber can make your bowel movements more regular.  Lower your cholesterol.  Relieve hemorrhoids, uncomplicated diverticulosis, or irritable bowel syndrome.  Prevent overeating as part of a weight-loss plan.  Prevent heart disease, type 2 diabetes, and certain cancers.  What is my plan? The recommended daily intake of fiber includes:  38 grams for men under age 25.  43 grams for men over age 83.  29 grams for women under age 38.  20 grams for women over age 80.  You can get the recommended daily intake of dietary fiber by eating a variety of fruits, vegetables, grains, and beans. Your health care provider may also recommend a fiber supplement if it is not possible to get enough fiber through your diet. What do I need to know about a high-fiber diet?  Fiber supplements have not been widely studied for their effectiveness, so it is better to get fiber through food sources.  Always check the fiber content on thenutrition facts label of any prepackaged food. Look for foods that contain at least 5 grams of fiber per serving.  Ask your dietitian if you have questions about specific foods that are related to your condition, especially if those foods are not listed in the following section.  Increase your daily fiber consumption gradually. Increasing your intake of dietary fiber too quickly may cause bloating, cramping, or gas.  Drink plenty of water. Water helps you to digest fiber. What foods can I eat? Grains Whole-grain breads. Multigrain cereal. Oats and oatmeal. Brown rice. Barley. Bulgur wheat. [place]. Bran muffins. Popcorn. Rye wafer crackers. Vegetables Sweet potatoes. Spinach. Kale.  Artichokes. Cabbage. Broccoli. Green peas. Carrots. Squash. Fruits Berries. Pears. Apples. Oranges. Avocados. Prunes and raisins. Dried figs. Meats and Other Protein Sources [place], kidney, pinto, and soy beans. Split peas. Lentils. Nuts and seeds. Dairy Fiber-fortified yogurt. Beverages Fiber-fortified soy milk. Fiber-fortified orange juice. Other Fiber bars. The items listed above may not be a complete list of recommended foods or beverages. Contact your dietitian for more options. What foods are not recommended? Grains White bread. Pasta made with refined flour. White rice. Vegetables Fried potatoes. Canned vegetables. Well-cooked vegetables. Fruits Fruit juice. Cooked, strained fruit. Meats and Other Protein Sources Fatty cuts of meat. Fried Sales executive or fried fish. Dairy Milk. Yogurt. Cream cheese. Sour cream. Beverages Soft drinks. Other Cakes and pastries. Butter and oils. The items listed above may not be a complete list of foods and beverages to avoid. Contact your dietitian for more information. What are some tips for including high-fiber foods in my diet?  Eat a wide variety of high-fiber foods.  Make sure that half of all grains consumed each day are whole grains.  Replace breads and cereals made from refined flour or white flour with whole-grain breads and cereals.  Replace white rice with brown rice, bulgur wheat, or millet.  Start the day with a breakfast that is high in fiber, such as a cereal that contains at least 5 grams of fiber per serving.  Use beans in place of meat in soups, salads, or pasta.  Eat high-fiber snacks, such as berries, raw vegetables, nuts, or popcorn. This information is not intended to replace advice given to you by your health care  provider. Make sure you discuss any questions you have with your health care provider. Document Released: 07/05/2005 Document Revised: 12/11/2015 Document Reviewed: 12/18/2013 Elsevier Interactive Patient  Education  2018 [employer].  Fatty Liver Fatty liver, also called hepatic steatosis or steatohepatitis, is a condition in which too much fat has built up in your liver cells. The liver removes harmful substances from your bloodstream. It produces fluids your body needs. It also helps your body use and store energy from the food you eat. In many cases, fatty liver does not cause symptoms or problems. It is often diagnosed when tests are being done for other reasons. However, over time, fatty liver can cause inflammation that may lead to more serious liver problems, such as scarring of the liver (cirrhosis). What are the causes? Causes of fatty liver may include:  Drinking too much alcohol.  Poor nutrition.  Obesity.  Cushing syndrome.  Diabetes.  Hyperlipidemia.  Pregnancy.  Certain drugs.  Poisons.  Some viral infections.  What increases the risk? You may be more likely to develop fatty liver if you:  Abuse alcohol.  Are pregnant.  Are overweight.  Have diabetes.  Have hepatitis.  Have a high triglyceride level.  What are the signs or symptoms? Fatty liver often does not cause any symptoms. In cases where symptoms develop, they can include:  Fatigue.  Weakness.  Weight loss.  Confusion.  Abdominal pain.  Yellowing of your skin and the white parts of your eyes (jaundice).  Nausea and vomiting.  How is this diagnosed? Fatty liver may be diagnosed by:  Physical exam and medical history.  Blood tests.  Imaging tests, such as an ultrasound, CT scan, or MRI.  Liver biopsy. A small sample of liver tissue is removed using a needle. The sample is then looked at under a microscope.  How is this treated? Fatty liver is often caused by other health conditions. Treatment for fatty liver may involve medicines and lifestyle changes to manage conditions such as:  Alcoholism.  High cholesterol.  Diabetes.  Being overweight or obese.  Follow these  instructions at home:  Eat a healthy diet as directed by your health care provider.  Exercise regularly. This can help you lose weight and control your cholesterol and diabetes. Talk to your health care provider about an exercise plan and which activities are best for you.  Do not drink alcohol.  Take medicines only as directed by your health care provider. Contact a health care provider if: You have difficulty controlling your:  Blood sugar.  Cholesterol.  Alcohol consumption.  Get help right away if:  You have abdominal pain.  You have jaundice.  You have nausea and vomiting. This information is not intended to replace advice given to you by your health care provider. Make sure you discuss any questions you have with your health care provider. Document Released: 08/20/2005 Document Revised: 12/11/2015 Document Reviewed: 11/14/2013 Elsevier Interactive Patient Education  2018 [place].  Gastroesophageal Reflux Disease, Adult Normally, food travels down the esophagus and stays in the stomach to be digested. If a person has gastroesophageal reflux disease (GERD), food and stomach acid move back up into the esophagus. When this happens, the esophagus becomes sore and swollen (inflamed). Over time, GERD can make small holes (ulcers) in the lining of the esophagus. Follow these instructions at home: Diet  Follow a diet as told by your doctor. You may need to avoid foods and drinks such as: ? Coffee and tea (with or without  caffeine). ? Drinks that contain alcohol. ? Energy drinks and sports drinks. ? Carbonated drinks or sodas. ? Chocolate and cocoa. ? Peppermint and mint flavorings. ? Garlic and onions. ? Horseradish. ? Spicy and acidic foods, such as peppers, chili powder, curry powder, vinegar, hot sauces, and BBQ sauce. ? Citrus fruit juices and citrus fruits, such as oranges, lemons, and limes. ? Tomato-based foods, such as red sauce, chili, salsa, and pizza with  red sauce. ? Fried and fatty foods, such as donuts, french fries, potato chips, and high-fat dressings. ? High-fat meats, such as hot dogs, rib eye steak, sausage, ham, and bacon. ? High-fat dairy items, such as whole milk, butter, and cream cheese.  Eat small meals often. Avoid eating large meals.  Avoid drinking large amounts of liquid with your meals.  Avoid eating meals during the 2-3 hours before bedtime.  Avoid lying down right after you eat.  Do not exercise right after you eat. General instructions  Pay attention to any changes in your symptoms.  Take over-the-counter and prescription medicines only as told by your doctor. Do not take aspirin, ibuprofen, or other NSAIDs unless your doctor says it is okay.  Do not use any tobacco products, including cigarettes, chewing tobacco, and e-cigarettes. If you need help quitting, ask your doctor.  Wear loose clothes. Do not wear anything tight around your waist.  Raise (elevate) the head of your bed about 6 inches (15 cm).  Try to lower your stress. If you need help doing this, ask your doctor.  If you are overweight, lose an amount of weight that is healthy for you. Ask your doctor about a safe weight loss goal.  Keep all follow-up visits as told by your doctor. This is important. Contact a doctor if:  You have new symptoms.  You lose weight and you do not know why it is happening.  You have trouble swallowing, or it hurts to swallow.  You have wheezing or a cough that keeps happening.  Your symptoms do not get better with treatment.  You have a hoarse voice. Get help right away if:  You have pain in your arms, neck, jaw, teeth, or back.  You feel sweaty, dizzy, or light-headed.  You have chest pain or shortness of breath.  You throw up (vomit) and your throw up looks like blood or coffee grounds.  You pass out (faint).  Your poop (stool) is bloody or black.  You cannot swallow, drink, or eat. This  information is not intended to replace advice given to you by your health care provider. Make sure you discuss any questions you have with your health care provider. Document Released: 12/22/2007 Document Revised: 12/11/2015 Document Reviewed: 10/30/2014 Elsevier Interactive Patient Education  [employer].

## 2017-12-29 NOTE — Progress Notes (Signed)
Keith Barber [address]  [place]  [place], [place] [address]  Main: [phone]  Fax: [phone]   Gastroenterology Consultation  Referring Provider:     Kathrine Haddock, NP Primary Care Physician:  Volney American, PA-C Primary Gastroenterologist:  Dr. Vonda Barber Reason for Consultation:     Diverticulitis        HPI:    Chief Complaint  Patient presents with  . Establish Care    referred by Kathrine Haddock for diverticulitis    Thadius Smisek is a 47 y.o. y/o male referred for consultation & management  by Dr. Orene Desanctis, Lilia Argue, PA-C.  In April 2019, patient complained of left lower quadrant sharp abdominal pain, constant to his primary care provider, to a stat CT scan that showed sigmoid diverticulitis.  He was treated with oral antibiotics as an outpatient.  Symptoms have improved significantly since then.  That same abdominal pain has completely resolved.  Now he just reports intermittent sharp left lower quadrant abdominal pain that lasts 1 second, occurs about 2 times a week, occurs when he moves a certain way, or moves his leg during physical therapy, not related to meals, no fever or chills, no altered bowel habits, no blood in stool or melena.  And he states this pain is not similar to the pain that occurred at the time of the diverticulitis.  No nausea or vomiting, no weight loss.  No previous colonoscopies.  No family history of colon cancer.  Reports chronic history of indigestion, and was on Zantac which is not controlling symptoms, and primary care has changed to omeprazole once daily.  He takes this in the morning but not 30 minutes before food.  Has only been taking this for the last 3 to 4 days.  No dysphagia, but reports chronic cough.  States it occurs when he chews, or eats spicy food.  No previous EGDs.  Denies recent or previous history of heavy alcohol use.  States does not drink any alcohol at this time.  Quit smoking.  Denies  any hepatotoxic drugs.  Past Medical History:  Diagnosis Date  . Bone spur    LEFT SHOULDER THAT MAKES HIS LEFT ARM TINGLE - had removed  . GERD (gastroesophageal reflux disease)   . Hyperlipidemia   . Hypertension   . Migraines    rare migraines    Past Surgical History:  Procedure Laterality Date  . INGUINAL HERNIA REPAIR Bilateral 01/21/2017   Procedure: LAPAROSCOPIC BILATERAL INGUINAL HERNIA REPAIR;  Surgeon: Jules Husbands, MD;  Location: [facility];  Service: General;  Laterality: Bilateral;  . KNEE SURGERY Left   . SHOULDER ARTHROSCOPY WITH DISTAL CLAVICLE RESECTION Left 07/05/2017   Procedure: LEFT SHOULDER ARTHROSCOPY, SUBACROMIAL DECOMPRESSION, DISTAL CLAVICLE RESECTION, POSSIBLE MINI OPEN ROTATOR CUFF REPAIR;  Surgeon: [name], MD;  Location: [facility];  Service: Orthopedics;  Laterality: Left;  . SHOULDER CLOSED REDUCTION Left 12/13/2017   Procedure: LEFT SHOULDER MANIPULATION with steroid injection;  Surgeon: [name], MD;  Location: [facility];  Service: Orthopedics;  Laterality: Left;  . SHOULDER SURGERY Right   . UMBILICAL HERNIA REPAIR N/A 01/21/2017   Procedure: HERNIA REPAIR UMBILICAL ADULT;  Surgeon: Jules Husbands, MD;  Location: [facility];  Service: General;  Laterality: N/A;    Prior to Admission medications   Medication Sig Start Date End Date Taking? Authorizing Provider  budesonide-formoterol (SYMBICORT) 160-4.5 MCG/ACT inhaler Inhale 2 puffs into the lungs 2 (two) times daily. 10/27/17  Yes  Volney American, PA-C  calcium carbonate (TUMS EX) 750 MG chewable tablet Chew 2 tablets by mouth daily as needed for heartburn.   Yes [provider]  FIBER PO Take 1 capsule by mouth daily.   Yes [provider]  lisinopril (PRINIVIL,ZESTRIL) 10 MG tablet Take 1 tablet (10 mg total) by mouth daily. 11/22/17  Yes Volney American, PA-C  loperamide (IMODIUM) 2 MG capsule Take 2-4 mg by mouth as needed for diarrhea or loose stools.   Yes  [provider]  naproxen sodium (ALEVE) 220 MG tablet Take 440 mg by mouth daily as needed (for pain or headache).    Yes [provider]  omeprazole (PRILOSEC) 20 MG capsule Take 1 capsule (20 mg total) by mouth daily. 12/26/17  Yes Kathrine Haddock, NP  ondansetron (ZOFRAN ODT) 4 MG disintegrating tablet Take 1 tablet (4 mg total) by mouth every 8 (eight) hours as needed for nausea or vomiting. 10/25/17  Yes Johnson, Megan P, DO  rizatriptan (MAXALT) 10 MG tablet Take 1 tablet (10 mg total) by mouth as needed for migraine. 10/19/17  Yes Volney American, PA-C  rosuvastatin (CRESTOR) 10 MG tablet TAKE 1 TABLET BY MOUTH ONCE DAILY 11/29/17  Yes Johnson, Megan P, DO  terbinafine (LAMISIL) 250 MG tablet Take 1 tablet (250 mg total) by mouth daily. 12/26/17  Yes Kathrine Haddock, NP  fluticasone (FLONASE) 50 MCG/ACT nasal spray Place 2 sprays into both nostrils 2 (two) times daily. Patient not taking: Reported on 12/29/2017 09/21/17   Volney American, PA-C  ranitidine (ZANTAC) 75 MG tablet Take 75 mg by mouth daily.     [provider]    Family History  Problem Relation Age of Onset  . Diabetes Mother   . Migraines Mother   . Cancer Neg Hx   . COPD Neg Hx   . Heart disease Neg Hx   . Stroke Neg Hx      Social History   Tobacco Use  . Smoking status: Former Smoker    Packs/day: 0.50    Years: 26.00    Pack years: 13.00    Types: Cigarettes    Last attempt to quit: 01/13/2017    Years since quitting: 0.9  . Smokeless tobacco: Never Used  . Tobacco comment: PT STARTED TAKING ZYBAN ON 01-08-17 IN HOPES OF QUITTING  Substance Use Topics  . Alcohol use: No  . Drug use: No    Allergies as of 12/29/2017  . (No Known Allergies)    Review of Systems:    All systems reviewed and negative except where noted in HPI.   Physical Exam:  BP 118/82   Pulse 88   Ht 5' 8.5" (1.74 m)   Wt 208 lb 3.2 oz (94.4 kg)   BMI 31.20 kg/m  No LMP for male patient. Psych:   Alert and cooperative. Normal mood and affect. General:   Alert,  Well-developed, well-nourished, pleasant and cooperative in NAD Head:  Normocephalic and atraumatic. Eyes:  Sclera clear, no icterus.   Conjunctiva pink. Ears:  Normal auditory acuity. Nose:  No deformity, discharge, or lesions. Mouth:  No deformity or lesions,oropharynx pink & moist. Neck:  Supple; no masses or thyromegaly. Lungs:  Respirations even and unlabored.  Clear throughout to auscultation.   No wheezes, crackles, or rhonchi. No acute distress. Heart:  Regular rate and rhythm; no murmurs, clicks, rubs, or gallops. Abdomen:  Normal bowel sounds.  No bruits.  Soft, non-tender and non-distended without masses,  hepatosplenomegaly or hernias noted.  No guarding or rebound tenderness.    Msk:  Symmetrical without gross deformities. Good, equal movement & strength bilaterally. Pulses:  Normal pulses noted. Extremities:  No clubbing or edema.  No cyanosis. Neurologic:  Alert and oriented x3;  grossly normal neurologically. Skin:  Intact without significant lesions or rashes. No jaundice. Lymph Nodes:  No significant cervical adenopathy. Psych:  Alert and cooperative. Normal mood and affect.   Labs: CBC    Component Value Date/Time   WBC 8.4 12/08/2017 0837   RBC 6.00 (H) 12/08/2017 0837   HGB 17.6 (H) 12/08/2017 0837   HGB 17.2 10/27/2017 0907   HCT 53.0 (H) 12/08/2017 0837   HCT 51.0 10/27/2017 0907   PLT 201 12/08/2017 0837   PLT 215 10/27/2017 0907   MCV 88.3 12/08/2017 0837   MCV 90 10/27/2017 0907   MCH 29.3 12/08/2017 0837   MCHC 33.2 12/08/2017 0837   RDW 12.5 12/08/2017 0837   RDW 12.8 10/27/2017 0907   LYMPHSABS 2.2 10/27/2017 0907   EOSABS 0.1 10/27/2017 0907   BASOSABS 0.0 10/27/2017 0907   CMP     Component Value Date/Time   NA 138 12/08/2017 0837   NA 141 10/19/2017 1519   K 4.2 12/08/2017 0837   CL 107 12/08/2017 0837   CO2 22 12/08/2017 0837   GLUCOSE 176 (H) 12/08/2017 0837   BUN 11  12/08/2017 0837   BUN 12 10/19/2017 1519   CREATININE 1.07 12/08/2017 0837   CALCIUM 8.6 (L) 12/08/2017 0837   PROT 7.5 10/19/2017 1519   ALBUMIN 4.7 10/19/2017 1519   AST 30 10/19/2017 1519   ALT 60 (H) 10/19/2017 1519   ALKPHOS 128 (H) 10/19/2017 1519   BILITOT 0.8 10/19/2017 1519   GFRNONAA >60 12/08/2017 0837   GFRAA >60 12/08/2017 0837    Imaging Studies: No results found.  Assessment and Plan:   Delfin Squillace is a 48 y.o. y/o male has been referred for episode of diverticulitis noted on CT scan done in April 2019 for left lower quadrant abdominal pain  Diverticulitis is an indication for colonoscopy However, patient also needs a screening colonoscopy as [employer] recommends screening colonoscopy after 48 years of age and patient has not had one. We will thus order screening colonoscopy which will also allow [place] to rule out any colonic lesions that could mass correlate to his diverticulitis.   No clinical evidence of diverticulitis at this time Is intermittent left lower quadrant abdominal pain that only lasts 1 second, and occurs 2 times a week only with certain movements and during physical therapy, is musculoskeletal in nature and not due to diverticulitis, as that pain was very different as per patient. High-fiber diet MiraLAX daily if patient is not having soft stool daily  Patient is reporting chronic cough, despite H2 RA and now PPI therapy This is likely due to acid reflux He also reports belching and indigestion Will evaluate with EGD to rule out Barrett's, evaluate for esophagitis.  No dysphagia Patient educated extensively on acid reflux lifestyle modification, including buying a bed wedge, not eating 3 hrs before bedtime, diet modifications, and handout given for the same.   Fatty liver was noted on the CT scan Diet and weight loss encouraged Patient was educated the fatty liver can be reversed with diet, weight loss and exercise However,  progression to cirrhosis can occur if above measures are not instituted, and he verbalized understanding of this discussion Educated against using hepatotoxic drugs  including alcohol Liver enzymes  showed chronic elevation of ALT and alk phos mildly, no clinical evidence of cirrhosis or biliary obstruction at this time We will rule out viral hepatitis, and autoimmune labs as well    Dr Keith Barber

## 2018-01-02 ENCOUNTER — Other Ambulatory Visit: Payer: Self-pay

## 2018-01-02 DIAGNOSIS — Z1211 Encounter for screening for malignant neoplasm of colon: Secondary | ICD-10-CM

## 2018-01-02 DIAGNOSIS — K219 Gastro-esophageal reflux disease without esophagitis: Secondary | ICD-10-CM

## 2018-01-02 MED ORDER — PEG 3350-KCL-NA BICARB-NACL 420 G PO SOLR: ORAL | 0 refills | Status: DC

## 2018-01-02 MED ORDER — BISACODYL 5 MG PO TBEC: 10.0000 mg | DELAYED_RELEASE_TABLET | Freq: Once | ORAL | 0 refills | Status: AC

## 2018-01-03 ENCOUNTER — Other Ambulatory Visit: Payer: Self-pay

## 2018-01-03 ENCOUNTER — Encounter: Payer: Self-pay | Admitting: Emergency Medicine

## 2018-01-03 ENCOUNTER — Emergency Department
Admission: EM | Admit: 2018-01-03 | Discharge: 2018-01-03 | Disposition: A | Discharge status: Home / Self Care | Payer: BLUE CROSS/BLUE SHIELD | Attending: Emergency Medicine | Admitting: Emergency Medicine

## 2018-01-03 ENCOUNTER — Encounter (INDEPENDENT_AMBULATORY_CARE_PROVIDER_SITE_OTHER): Payer: Self-pay | Admitting: Orthopaedic Surgery

## 2018-01-03 ENCOUNTER — Ambulatory Visit (INDEPENDENT_AMBULATORY_CARE_PROVIDER_SITE_OTHER): Payer: BLUE CROSS/BLUE SHIELD | Admitting: Orthopaedic Surgery

## 2018-01-03 VITALS — BP 127/90 | HR 87 | Ht 68.0 in | Wt 209.0 lb

## 2018-01-03 DIAGNOSIS — X500XXA Overexertion from strenuous movement or load, initial encounter: Secondary | ICD-10-CM | POA: Diagnosis not present

## 2018-01-03 DIAGNOSIS — M79602 Pain in left arm: Secondary | ICD-10-CM

## 2018-01-03 DIAGNOSIS — I10 Essential (primary) hypertension: Secondary | ICD-10-CM | POA: Diagnosis not present

## 2018-01-03 DIAGNOSIS — Y999 Unspecified external cause status: Secondary | ICD-10-CM | POA: Insufficient documentation

## 2018-01-03 DIAGNOSIS — Z87891 Personal history of nicotine dependence: Secondary | ICD-10-CM | POA: Diagnosis not present

## 2018-01-03 DIAGNOSIS — S46212A Strain of muscle, fascia and tendon of other parts of biceps, left arm, initial encounter: Secondary | ICD-10-CM

## 2018-01-03 DIAGNOSIS — Y929 Unspecified place or not applicable: Secondary | ICD-10-CM | POA: Diagnosis not present

## 2018-01-03 DIAGNOSIS — Z79899 Other long term (current) drug therapy: Secondary | ICD-10-CM | POA: Insufficient documentation

## 2018-01-03 DIAGNOSIS — S4992XA Unspecified injury of left shoulder and upper arm, initial encounter: Secondary | ICD-10-CM | POA: Diagnosis present

## 2018-01-03 DIAGNOSIS — Y9389 Activity, other specified: Secondary | ICD-10-CM | POA: Insufficient documentation

## 2018-01-03 LAB — GAMMA GT: GGT: 153 IU/L — AB (ref 0–65)

## 2018-01-03 LAB — MITOCHONDRIAL/SMOOTH MUSCLE AB PNL
Mitochondrial Ab: <20 Units (ref 0.0–20.0)
SMOOTH MUSCLE AB: 17 U (ref 0–19)

## 2018-01-03 LAB — FERRITIN: Ferritin: 364 ng/mL (ref 30–400)

## 2018-01-03 LAB — HEPATITIS B CORE ANTIBODY, TOTAL: Hep B Core Total Ab: NEGATIVE

## 2018-01-03 LAB — HEPATITIS A ANTIBODY, TOTAL: HEP A TOTAL AB: NEGATIVE

## 2018-01-03 LAB — HEPATITIS B SURFACE ANTIBODY,QUALITATIVE: Hep B Surface Ab, Qual: NONREACTIVE

## 2018-01-03 LAB — HEPATITIS A ANTIBODY, IGM: Hep A IgM: NEGATIVE

## 2018-01-03 LAB — HEPATITIS B SURFACE ANTIGEN: Hepatitis B Surface Ag: NEGATIVE

## 2018-01-03 LAB — CERULOPLASMIN: CERULOPLASMIN: 20.8 mg/dL (ref 16.0–31.0)

## 2018-01-03 MED ORDER — MORPHINE SULFATE (PF) 4 MG/ML IV SOLN
4.0000 mg | Freq: Once | INTRAVENOUS | Status: AC
  Administered 2018-01-03: 4 mg via INTRAMUSCULAR
  Filled 2018-01-03: qty 1

## 2018-01-03 MED ORDER — HYDROCODONE-ACETAMINOPHEN 5-325 MG PO TABS: 1.0000 | ORAL_TABLET | Freq: Four times a day (QID) | ORAL | 0 refills | Status: DC | PRN

## 2018-01-03 MED ORDER — ONDANSETRON 4 MG PO TBDP
4.0000 mg | ORAL_TABLET | Freq: Once | ORAL | Status: AC
  Administered 2018-01-03: 4 mg via ORAL
  Filled 2018-01-03: qty 1

## 2018-01-03 NOTE — ED Notes (Signed)
See triage note  Presents with pain to left arm  States he developed pain this am  States he reached to scratch groin and felt a pop to left shoulder   Having increased pain with movement  No deformity noted  Good pulses

## 2018-01-03 NOTE — ED Provider Notes (Signed)
[facility] Emergency Department Provider Note  ____________________________________________   None    (approximate)  I have reviewed the triage vital signs and the nursing notes.   HISTORY  Chief Complaint Shoulder Pain    HPI Keith Barber is a 47 y.o. male presents emergency department complaining of the left arm pain.  He states that he reached down to scratch his groin and felt a pop in the left shoulder.  He recently had shoulder surgery and ended up having a frozen shoulder.  He had an injection of steroids and a manipulation of the shoulder in May.  He states he has been telling his surgeon that he has had pain going in the arm to the forearm.  He states now it is excruciating to either supinate or rotate the arm.  The only position he can get comfortable and is to keep the arm bent and hand touching the clavicle.  He denies numbness or tingling  Past Medical History:  Diagnosis Date  . Bone spur    LEFT SHOULDER THAT MAKES HIS LEFT ARM TINGLE - had removed  . GERD (gastroesophageal reflux disease)   . Hyperlipidemia   . Hypertension   . Migraines    rare migraines    Patient Active Problem List   Diagnosis Date Noted  . History of diverticulitis 12/26/2017  . Chronic cough 12/26/2017  . Onychomycosis 12/26/2017  . Adhesive capsulitis of left shoulder 12/15/2017  . Impingement syndrome of left shoulder 07/05/2017  . 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  . Umbilical hernia without obstruction and without gangrene   . Bilateral inguinal hernia without obstruction or gangrene   . History of tobacco abuse 11/26/2016  . Essential hypertension   . Hyperlipidemia   . Migraines   . Rotator cuff tendinitis, left 11/12/2015  . Labral tear of shoulder, left, initial encounter 11/12/2015    Past Surgical History:  Procedure Laterality Date  . INGUINAL HERNIA REPAIR Bilateral 01/21/2017   Procedure: LAPAROSCOPIC  BILATERAL INGUINAL HERNIA REPAIR;  Surgeon: Jules Husbands, MD;  Location: [facility];  Service: General;  Laterality: Bilateral;  . KNEE SURGERY Left   . SHOULDER ARTHROSCOPY WITH DISTAL CLAVICLE RESECTION Left 07/05/2017   Procedure: LEFT SHOULDER ARTHROSCOPY, SUBACROMIAL DECOMPRESSION, DISTAL CLAVICLE RESECTION, POSSIBLE MINI OPEN ROTATOR CUFF REPAIR;  Surgeon: [name], MD;  Location: [facility];  Service: Orthopedics;  Laterality: Left;  . SHOULDER CLOSED REDUCTION Left 12/13/2017   Procedure: LEFT SHOULDER MANIPULATION with steroid injection;  Surgeon: [name], MD;  Location: [facility];  Service: Orthopedics;  Laterality: Left;  . SHOULDER SURGERY Right   . UMBILICAL HERNIA REPAIR N/A 01/21/2017   Procedure: HERNIA REPAIR UMBILICAL ADULT;  Surgeon: Jules Husbands, MD;  Location: [facility];  Service: General;  Laterality: N/A;    Prior to Admission medications   Medication Sig Start Date End Date Taking? Authorizing Provider  budesonide-formoterol (SYMBICORT) 160-4.5 MCG/ACT inhaler Inhale 2 puffs into the lungs 2 (two) times daily. 10/27/17   Volney American, PA-C  calcium carbonate (TUMS EX) 750 MG chewable tablet Chew 2 tablets by mouth daily as needed for heartburn.    [provider]  FIBER PO Take 1 capsule by mouth daily.    [provider]  HYDROcodone-acetaminophen (NORCO/VICODIN) 5-325 MG tablet Take 1 tablet by mouth every 6 (six) hours as needed for moderate pain. 01/03/18   Fisher, Linden Dolin, PA-C  lisinopril (PRINIVIL,ZESTRIL) 10 MG tablet Take 1 tablet (10  mg total) by mouth daily. 11/22/17   Volney American, PA-C  loperamide (IMODIUM) 2 MG capsule Take 2-4 mg by mouth as needed for diarrhea or loose stools.    [provider]  naproxen sodium (ALEVE) 220 MG tablet Take 440 mg by mouth daily as needed (for pain or headache).     [provider]  omeprazole (PRILOSEC) 20 MG capsule Take 1 capsule (20 mg total) by mouth daily.  12/26/17   Kathrine Haddock, NP  ondansetron (ZOFRAN ODT) 4 MG disintegrating tablet Take 1 tablet (4 mg total) by mouth every 8 (eight) hours as needed for nausea or vomiting. 10/25/17   Park Liter P, DO  polyethylene glycol-electrolytes (NULYTELY/GOLYTELY) 420 g solution Use as directed, split in half-2 doses 01/02/18   [name] B, MD  ranitidine (ZANTAC) 75 MG tablet Take 75 mg by mouth daily.     [provider]  rizatriptan (MAXALT) 10 MG tablet Take 1 tablet (10 mg total) by mouth as needed for migraine. 10/19/17   Volney American, PA-C  rosuvastatin (CRESTOR) 10 MG tablet TAKE 1 TABLET BY MOUTH ONCE DAILY 11/29/17   Park Liter P, DO  terbinafine (LAMISIL) 250 MG tablet Take 1 tablet (250 mg total) by mouth daily. 12/26/17   Kathrine Haddock, NP    Allergies Patient has no known allergies.  Family History  Problem Relation Age of Onset  . Diabetes Mother   . Migraines Mother   . Cancer Neg Hx   . COPD Neg Hx   . Heart disease Neg Hx   . Stroke Neg Hx     Social History Social History   Tobacco Use  . Smoking status: Former Smoker    Packs/day: 0.50    Years: 26.00    Pack years: 13.00    Types: Cigarettes    Last attempt to quit: 01/13/2017    Years since quitting: 0.9  . Smokeless tobacco: Never Used  . Tobacco comment: PT STARTED TAKING ZYBAN ON 01-08-17 IN HOPES OF QUITTING  Substance Use Topics  . Alcohol use: No  . Drug use: No    Review of Systems  Constitutional: No fever/chills Eyes: No visual changes. ENT: No sore throat. Respiratory: Denies cough Genitourinary: Negative for dysuria. Musculoskeletal: Negative for back pain.  Positive for left arm pain Skin: Negative for rash.    ____________________________________________   PHYSICAL EXAM:  VITAL SIGNS: ED Triage Vitals  Enc Vitals Group     BP 01/03/18 0742 (!) 137/95     Pulse Rate 01/03/18 0742 87     Resp 01/03/18 0742 15     Temp 01/03/18 0742 98 F (36.7 C)      Temp Source 01/03/18 0742 Oral     SpO2 01/03/18 0742 97 %     Weight 01/03/18 0743 208 lb (94.3 kg)     Height 01/03/18 0743 5' 8.5" (1.74 m)     Head Circumference --      Peak Flow --      Pain Score 01/03/18 0743 3     Pain Loc --      Pain Edu? --      Excl. in [place]? --     Constitutional: Alert and oriented. Well appearing and in no acute distress. Eyes: Conjunctivae are normal.  Head: Atraumatic. Nose: No congestion/rhinnorhea. Mouth/Throat: Mucous membranes are moist.   Cardiovascular: Normal rate, regular rhythm. Respiratory: Normal respiratory effort.  No retractions GU: deferred Musculoskeletal: Decreased range of motion of  the left arm.  He has a "Popeye" muscle.  This area is also tender to palpation.  Pain is reproduced with movement of the elbow.  He is neurovascularly intact.  becomes diaphoretic with movement of the arm. Neurologic:  Normal speech and language.  Skin:  Skin is warm, dry and intact. No rash noted. Psychiatric: Mood and affect are normal. Speech and behavior are normal.  ____________________________________________   LABS (all labs ordered are listed, but only abnormal results are displayed)  Labs Reviewed - No data to display ____________________________________________   ____________________________________________  RADIOLOGY    ____________________________________________   PROCEDURES  Procedure(s) performed: Sling, morphine 4 mg IM  Procedures    ____________________________________________   INITIAL IMPRESSION / ASSESSMENT AND PLAN / ED COURSE  Pertinent labs & imaging results that were available during my care of the patient were reviewed by me and considered in my medical decision making (see chart for details).  Patient is a 47 year old male presents emergency department complaining of left shoulder and arm pain.  He states he felt a large snap this morning and has had severe pain since.  He denies any numbness or  tingling.  He has history of shoulder surgery and recent manipulation of a frozen shoulder.  On physical exam the left shoulder is negative for bony tenderness.  There left humerus has a "Popeye" muscle.  Pain is reproduced with any movement of the elbow.  He is neurovascularly intact.  The remainder the exam is unremarkable  Explained the findings to the patient.  He was given a sling.  He was given 4 mg of morphine IM.  Zofran 4 mg ODT.  He is to call his surgeon and follow-up with them.  He states he understands and will comply.  He was given a prescription for Vicodin 5/325 #15 with no refill.  Was discharged in stable condition in the care of his wife.     As part of my medical decision making, I reviewed the following data within the [place] notes reviewed and incorporated, Old chart reviewed, Notes from prior ED visits and [place] Controlled Substance Database  ____________________________________________   FINAL CLINICAL IMPRESSION(S) / ED DIAGNOSES  Final diagnoses:  Biceps rupture, proximal, left, initial encounter      NEW MEDICATIONS STARTED DURING THIS VISIT:  Discharge Medication List as of 01/03/2018  9:16 AM    START taking these medications   Details  HYDROcodone-acetaminophen (NORCO/VICODIN) 5-325 MG tablet Take 1 tablet by mouth every 6 (six) hours as needed for moderate pain., Starting Tue 01/03/2018, Print         Note:  This document was prepared using Dragon voice recognition software and may include unintentional dictation errors.    Versie Starks, PA-C 01/03/18 1038    Schaevitz, Randall An, MD 01/03/18 [phone]

## 2018-01-03 NOTE — ED Triage Notes (Signed)
C/O "popping noise and severe pain" to left shoulder.  States occurred about 15 minutes PTA when patient was reaching down to scratch his leg.  Patient states he had shoulder surgery in December 2018, had some bone spurs removed and a ligament pinned.  Also had a shoulder manipulation done 5 weeks ago.  Patient sees Dr. Gerhard Perches with [facility].

## 2018-01-03 NOTE — Discharge Instructions (Addendum)
Follow up with your regular doctor, call your orthopedic surgeon for evaluation.  Wear the sling as much as possible.  Apply ice to the area

## 2018-01-03 NOTE — Progress Notes (Signed)
Office Visit Note   Patient: Keith Barber           Date of Birth: 1971-01-12           MRN: [id number] Visit Date: 01/03/2018              Requested by: Volney American, PA-C [place],  [id number] PCP: Volney American, [place]   Assessment & Plan: Visit Diagnoses:  1. Arm pain, diffuse, left     Plan: Acute onset of left arm pain this morning.  Seen in the emergency room with possible biceps tendon tear.  Placed in a sling and follows up in the office.  Based on his exam I think he is torn the long head of the biceps tendon.  There are no skin changes.  Difficult to exam is encouraged touch.  We will continue with sling and reevaluate and 2 weeks.  Depending upon symptoms, reexamine and consider MRI scan. note not to work today and tomorrow  Follow-Up Instructions: Return in about 2 weeks (around 01/17/2018).   Orders:  No orders of the defined types were placed in this encounter.  No orders of the defined types were placed in this encounter.     Procedures: No procedures performed   Clinical Data: No additional findings.   Subjective: Chief Complaint  Patient presents with  . Left Shoulder - Pain  . New Patient (Initial Visit)    SCRATCHING STOMACH AND HEARD A POP IN LEFT SHOULDRE, WENT TO ER THIS AM AND WAS TOLD HE TORE BICEP ON LEFT ARM  Keith Barber is status post arthroscopic evaluation of his left shoulder with subsequent development of adhesive capsulitis.  He is had a manipulation with excellent response.  This morning he woke up and was reaching for an object with acute onset of pain in his left arm.  He was seen in the emergency room was told that he ruptured his biceps tendon.  He was placed in a sling and follows up in the office.  He has not had any numbness or tingling.  His pain is somewhat diffuse and difficult to localize.  He is having pain in the shoulder in the area of the biceps even in the elbow.  HPI  Review of Systems    Constitutional: Negative for fatigue and fever.  HENT: Negative for ear pain.   Eyes: Negative for pain.  Respiratory: Positive for cough. Negative for shortness of breath.   Cardiovascular: Negative for leg swelling.  Gastrointestinal: Negative for constipation and diarrhea.  Genitourinary: Negative for difficulty urinating.  Musculoskeletal: Positive for back pain. Negative for neck pain.  Skin: Negative for rash.  Allergic/Immunologic: Negative for food allergies.  Neurological: Positive for weakness.  Hematological: Does not bruise/bleed easily.  Psychiatric/Behavioral: Negative for sleep disturbance.     Objective: Vital Signs: BP 127/90 (BP Location: Right Arm, Patient Position: Sitting, Cuff Size: Normal)   Pulse 87   Ht 5\' 8"  (1.727 m)   Wt 209 lb (94.8 kg)   BMI 31.78 kg/m   Physical Exam  Ortho Exam awake alert and oriented x3.  Comfortable sitting arm examined out of the sling.  Exam was difficult as he hurt everywhere I touch.  The distal biceps tendon appears to be intact.  There is no ecchymosis or erythema.  With his elbow flexed he was able to raise his left shoulder overhead.  He had tenderness along the anterior proximal shoulder without any skin changes.  Biceps  did appear to be a little distal in position.  Specialty Comments:  No specialty comments available.  Imaging: No results found.   PMFS History: Patient Active Problem List   Diagnosis Date Noted  . History of diverticulitis 12/26/2017  . Chronic cough 12/26/2017  . Onychomycosis 12/26/2017  . Adhesive capsulitis of left shoulder 12/15/2017  . Impingement syndrome of left shoulder 07/05/2017  . 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  . Umbilical hernia without obstruction and without gangrene   . Bilateral inguinal hernia without obstruction or gangrene   . History of tobacco abuse 11/26/2016  . Essential hypertension   . Hyperlipidemia   . Migraines   . Rotator  cuff tendinitis, left 11/12/2015  . Labral tear of shoulder, left, initial encounter 11/12/2015   Past Medical History:  Diagnosis Date  . Bone spur    LEFT SHOULDER THAT MAKES HIS LEFT ARM TINGLE - had removed  . GERD (gastroesophageal reflux disease)   . Hyperlipidemia   . Hypertension   . Migraines    rare migraines    Family History  Problem Relation Age of Onset  . Diabetes Mother   . Migraines Mother   . Cancer Neg Hx   . COPD Neg Hx   . Heart disease Neg Hx   . Stroke Neg Hx     Past Surgical History:  Procedure Laterality Date  . INGUINAL HERNIA REPAIR Bilateral 01/21/2017   Procedure: LAPAROSCOPIC BILATERAL INGUINAL HERNIA REPAIR;  Surgeon: Jules Husbands, MD;  Location: [facility];  Service: General;  Laterality: Bilateral;  . KNEE SURGERY Left   . SHOULDER ARTHROSCOPY WITH DISTAL CLAVICLE RESECTION Left 07/05/2017   Procedure: LEFT SHOULDER ARTHROSCOPY, SUBACROMIAL DECOMPRESSION, DISTAL CLAVICLE RESECTION, POSSIBLE MINI OPEN ROTATOR CUFF REPAIR;  Surgeon: [name], MD;  Location: [place];  Service: Orthopedics;  Laterality: Left;  . SHOULDER CLOSED REDUCTION Left 12/13/2017   Procedure: LEFT SHOULDER MANIPULATION with steroid injection;  Surgeon: [name], MD;  Location: [place];  Service: Orthopedics;  Laterality: Left;  . SHOULDER SURGERY Right   . UMBILICAL HERNIA REPAIR N/A 01/21/2017   Procedure: HERNIA REPAIR UMBILICAL ADULT;  Surgeon: Jules Husbands, MD;  Location: [facility];  Service: General;  Laterality: N/A;   Social History   Occupational History  . Not on file  Tobacco Use  . Smoking status: Former Smoker    Packs/day: 0.50    Years: 26.00    Pack years: 13.00    Types: Cigarettes    Last attempt to quit: 01/13/2017    Years since quitting: 0.9  . Smokeless tobacco: Never Used  . Tobacco comment: PT STARTED TAKING ZYBAN ON 01-08-17 IN HOPES OF QUITTING  Substance and Sexual Activity  . Alcohol use: No  . Drug use: No  . Sexual  activity: Not on file

## 2018-01-06 ENCOUNTER — Ambulatory Visit (INDEPENDENT_AMBULATORY_CARE_PROVIDER_SITE_OTHER): Payer: BLUE CROSS/BLUE SHIELD | Admitting: Orthopaedic Surgery

## 2018-01-06 ENCOUNTER — Telehealth: Payer: Self-pay | Admitting: Gastroenterology

## 2018-01-06 NOTE — Telephone Encounter (Signed)
Pt is calling to reschedule his procedure for 7/18

## 2018-01-09 NOTE — Telephone Encounter (Signed)
Colonoscopy and EGD rescheduled to 02/01/18. [facility] notified.

## 2018-01-10 ENCOUNTER — Telehealth: Payer: Self-pay

## 2018-01-10 MED ORDER — LOSARTAN POTASSIUM 50 MG PO TABS: 50.0000 mg | ORAL_TABLET | Freq: Every day | ORAL | 3 refills | Status: DC

## 2018-01-10 NOTE — Telephone Encounter (Signed)
Called and left patient a VM (signed DPR) letting him know about medication. Asked for patient to call back to schedule on month f/up.

## 2018-01-10 NOTE — Telephone Encounter (Signed)
Received a fax from [facility] stating "Patient evaluated today by Dr. Pryor Ochoa and he is requesting for patient's lisinopril to be changed to an ARB to see if it helps with chronic cough." Please advise.

## 2018-01-10 NOTE — Telephone Encounter (Signed)
Rx changed. OK to follow up as planned on 8/6

## 2018-01-13 ENCOUNTER — Ambulatory Visit: Payer: BLUE CROSS/BLUE SHIELD | Admitting: Family Medicine

## 2018-01-20 ENCOUNTER — Encounter (INDEPENDENT_AMBULATORY_CARE_PROVIDER_SITE_OTHER): Payer: Self-pay | Admitting: Orthopaedic Surgery

## 2018-01-20 ENCOUNTER — Ambulatory Visit (INDEPENDENT_AMBULATORY_CARE_PROVIDER_SITE_OTHER): Payer: BLUE CROSS/BLUE SHIELD | Admitting: Orthopaedic Surgery

## 2018-01-20 ENCOUNTER — Ambulatory Visit (INDEPENDENT_AMBULATORY_CARE_PROVIDER_SITE_OTHER): Payer: Self-pay

## 2018-01-20 VITALS — BP 136/96 | HR 89 | Ht 68.0 in | Wt 209.0 lb

## 2018-01-20 DIAGNOSIS — M545 Low back pain: Secondary | ICD-10-CM

## 2018-01-20 DIAGNOSIS — M25512 Pain in left shoulder: Secondary | ICD-10-CM | POA: Diagnosis not present

## 2018-01-20 NOTE — Progress Notes (Signed)
Office Visit Note   Patient: Keith Barber           Date of Birth: January 29, 1971           MRN: [id number] Visit Date: 01/20/2018              Requested by: Volney American, PA-C [place], [place] [address] PCP: Volney American, PA-C   Assessment & Plan: Visit Diagnoses:  1. Acute midline low back pain, with sciatica presence unspecified   2. Acute pain of left shoulder     Plan: Seen several weeks ago for evaluation of acute onset left shoulder pain that appeared to be related to rupture of the long head of the biceps tendon.  Placed in a sling.  Presently relates he is doing very well and not having any pain.  Continue to follow and urged him to do his exercises.  Also relates over 30-year history of low back pain that "comes and goes.  No lower extremity discomfort.  Years ago saw a chiropractor with some relief.  Films demonstrate degenerative changes at L5-S1.  Long discussion regarding diagnosis of degenerative disc disease.  We will try a course of exercises over the next 2 to 3 months and if no improvement consider MRI scan Follow-Up Instructions: No follow-ups on file.   Orders:  Orders Placed This Encounter  Procedures  . XR Lumbar Spine 2-3 Views   No orders of the defined types were placed in this encounter.     Procedures: No procedures performed   Clinical Data: No additional findings.   Subjective: No chief complaint on file. Keith Barber was seen several weeks ago with acute onset of left shoulder pain.  He has had a prior arthroscopic procedure with subsequent manipulation and was doing well until onset of this particular pain.  Think that he ruptured the long head of the biceps tendon.  He was initially placed in a sling.  Presently relates she is doing very well without any significant pain.  Not had any numbness or tingling.  He is able to sleep on that side.  He also relates of 30-year history of recurrent low back pain.  Presently he is  asymptomatic.  He has been seen by a chiropractor in the past with x-rays demonstrating some "abnormalities".  He has not had any bowel or bladder changes.  He has not had any lower extremity pain.  HPI  Review of Systems  Constitutional: Negative for fatigue and fever.  HENT: Negative for ear pain.   Eyes: Negative for pain.  Respiratory: Positive for cough. Negative for shortness of breath.   Cardiovascular: Negative for leg swelling.  Gastrointestinal: Positive for constipation. Negative for diarrhea.  Genitourinary: Negative for difficulty urinating.  Musculoskeletal: Positive for back pain. Negative for neck pain.  Skin: Negative for rash.  Allergic/Immunologic: Negative for food allergies.  Neurological: Positive for weakness. Negative for numbness.  Hematological: Bruises/bleeds easily.  Psychiatric/Behavioral: Positive for sleep disturbance.     Objective: Vital Signs: BP (!) 136/96 (BP Location: Right Arm, Patient Position: Sitting, Cuff Size: Normal)   Pulse 89   Ht 5\' 8"  (1.727 m)   Wt 209 lb (94.8 kg)   BMI 31.78 kg/m   Physical Exam  Constitutional: He is oriented to person, place, and time. He appears well-developed and well-nourished.  HENT:  Mouth/Throat: Oropharynx is clear and moist.  Eyes: Pupils are equal, round, and reactive to light. EOM are normal.  Pulmonary/Chest: Effort normal.  Neurological: He is alert and oriented to person, place, and time.  Skin: Skin is warm and dry.  Psychiatric: He has a normal mood and affect. His behavior is normal.    Ortho Exam awake alert and oriented x3.  Comfortable sitting.  Straight leg raise negative bilaterally.  Painless range of motion both hips and both knees.  No significant percussible tenderness of the lumbar spine or the sacroiliac joints.  Figure 4 test negative.  Neurologically intact.  Shoulder exam with negative impingement.  Had just about full overhead motion with a little bit of "feeling of  tightness".  Able to touch to the hand of the middle of his back.  Biceps appears to be a little lower in position.  No localized tenderness over the biceps tendon the acromioclavicular joint or the subacromial region.  Good strength.  Skin intact.  Neurologically intact  Specialty Comments:  No specialty comments available.  Imaging: No results found.   PMFS History: Patient Active Problem List   Diagnosis Date Noted  . History of diverticulitis 12/26/2017  . Chronic cough 12/26/2017  . Onychomycosis 12/26/2017  . Adhesive capsulitis of left shoulder 12/15/2017  . Impingement syndrome of left shoulder 07/05/2017  . 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  . Umbilical hernia without obstruction and without gangrene   . Bilateral inguinal hernia without obstruction or gangrene   . History of tobacco abuse 11/26/2016  . Essential hypertension   . Hyperlipidemia   . Migraines   . Rotator cuff tendinitis, left 11/12/2015  . Labral tear of shoulder, left, initial encounter 11/12/2015   Past Medical History:  Diagnosis Date  . Bone spur    LEFT SHOULDER THAT MAKES HIS LEFT ARM TINGLE - had removed  . GERD (gastroesophageal reflux disease)   . Hyperlipidemia   . Hypertension   . Migraines    rare migraines    Family History  Problem Relation Age of Onset  . Diabetes Mother   . Migraines Mother   . Cancer Neg Hx   . COPD Neg Hx   . Heart disease Neg Hx   . Stroke Neg Hx     Past Surgical History:  Procedure Laterality Date  . INGUINAL HERNIA REPAIR Bilateral 01/21/2017   Procedure: LAPAROSCOPIC BILATERAL INGUINAL HERNIA REPAIR;  Surgeon: Jules Husbands, MD;  Location: [facility];  Service: General;  Laterality: Bilateral;  . KNEE SURGERY Left   . SHOULDER ARTHROSCOPY WITH DISTAL CLAVICLE RESECTION Left 07/05/2017   Procedure: LEFT SHOULDER ARTHROSCOPY, SUBACROMIAL DECOMPRESSION, DISTAL CLAVICLE RESECTION, POSSIBLE MINI OPEN ROTATOR CUFF REPAIR;  Surgeon:  [name], MD;  Location: [place];  Service: Orthopedics;  Laterality: Left;  . SHOULDER CLOSED REDUCTION Left 12/13/2017   Procedure: LEFT SHOULDER MANIPULATION with steroid injection;  Surgeon: [name], MD;  Location: [place];  Service: Orthopedics;  Laterality: Left;  . SHOULDER SURGERY Right   . UMBILICAL HERNIA REPAIR N/A 01/21/2017   Procedure: HERNIA REPAIR UMBILICAL ADULT;  Surgeon: Jules Husbands, MD;  Location: [facility];  Service: General;  Laterality: N/A;   Social History   Occupational History  . Not on file  Tobacco Use  . Smoking status: Former Smoker    Packs/day: 0.50    Years: 26.00    Pack years: 13.00    Types: Cigarettes    Last attempt to quit: 01/13/2017    Years since quitting: 1.0  . Smokeless tobacco: Never Used  . Tobacco comment: PT STARTED TAKING ZYBAN ON  01-08-17 IN HOPES OF QUITTING  Substance and Sexual Activity  . Alcohol use: No  . Drug use: No  . Sexual activity: Not on file

## 2018-01-20 NOTE — Patient Instructions (Signed)

## 2018-01-31 ENCOUNTER — Telehealth: Payer: Self-pay | Admitting: Gastroenterology

## 2018-01-31 NOTE — Telephone Encounter (Signed)
You spoke to him early and now he needs codes [phone]

## 2018-01-31 NOTE — Telephone Encounter (Signed)
Procedure codes have been given.

## 2018-02-01 ENCOUNTER — Ambulatory Visit: Payer: BLUE CROSS/BLUE SHIELD | Admitting: Anesthesiology

## 2018-02-01 ENCOUNTER — Other Ambulatory Visit: Payer: Self-pay

## 2018-02-01 ENCOUNTER — Ambulatory Visit
Admission: RE | Admit: 2018-02-01 | Discharge: 2018-02-01 | Disposition: A | Discharge status: Home / Self Care | Payer: BLUE CROSS/BLUE SHIELD | Source: Ambulatory Visit | Attending: Gastroenterology | Admitting: Gastroenterology

## 2018-02-01 ENCOUNTER — Encounter
Admission: RE | Disposition: A | Discharge status: Home / Self Care | Payer: Self-pay | Source: Ambulatory Visit | Attending: Gastroenterology

## 2018-02-01 DIAGNOSIS — K648 Other hemorrhoids: Secondary | ICD-10-CM

## 2018-02-01 DIAGNOSIS — M1990 Unspecified osteoarthritis, unspecified site: Secondary | ICD-10-CM | POA: Diagnosis not present

## 2018-02-01 DIAGNOSIS — R05 Cough: Secondary | ICD-10-CM | POA: Diagnosis not present

## 2018-02-01 DIAGNOSIS — Z833 Family history of diabetes mellitus: Secondary | ICD-10-CM | POA: Diagnosis not present

## 2018-02-01 DIAGNOSIS — Z79899 Other long term (current) drug therapy: Secondary | ICD-10-CM | POA: Diagnosis not present

## 2018-02-01 DIAGNOSIS — K644 Residual hemorrhoidal skin tags: Secondary | ICD-10-CM | POA: Diagnosis not present

## 2018-02-01 DIAGNOSIS — Z820 Family history of epilepsy and other diseases of the nervous system: Secondary | ICD-10-CM | POA: Diagnosis not present

## 2018-02-01 DIAGNOSIS — K219 Gastro-esophageal reflux disease without esophagitis: Secondary | ICD-10-CM

## 2018-02-01 DIAGNOSIS — K3189 Other diseases of stomach and duodenum: Secondary | ICD-10-CM

## 2018-02-01 DIAGNOSIS — I10 Essential (primary) hypertension: Secondary | ICD-10-CM | POA: Diagnosis not present

## 2018-02-01 DIAGNOSIS — K5730 Diverticulosis of large intestine without perforation or abscess without bleeding: Secondary | ICD-10-CM

## 2018-02-01 DIAGNOSIS — Z1211 Encounter for screening for malignant neoplasm of colon: Secondary | ICD-10-CM | POA: Diagnosis not present

## 2018-02-01 DIAGNOSIS — R51 Headache: Secondary | ICD-10-CM | POA: Insufficient documentation

## 2018-02-01 DIAGNOSIS — R12 Heartburn: Secondary | ICD-10-CM | POA: Diagnosis not present

## 2018-02-01 DIAGNOSIS — D127 Benign neoplasm of rectosigmoid junction: Secondary | ICD-10-CM | POA: Insufficient documentation

## 2018-02-01 DIAGNOSIS — Z87891 Personal history of nicotine dependence: Secondary | ICD-10-CM | POA: Diagnosis not present

## 2018-02-01 DIAGNOSIS — K2950 Unspecified chronic gastritis without bleeding: Secondary | ICD-10-CM | POA: Diagnosis not present

## 2018-02-01 DIAGNOSIS — E785 Hyperlipidemia, unspecified: Secondary | ICD-10-CM | POA: Diagnosis not present

## 2018-02-01 HISTORY — DX: Diverticulitis of large intestine without perforation or abscess without bleeding: K57.32

## 2018-02-01 HISTORY — PX: COLONOSCOPY WITH PROPOFOL: SHX5780

## 2018-02-01 HISTORY — PX: ESOPHAGOGASTRODUODENOSCOPY (EGD) WITH PROPOFOL: SHX5813

## 2018-02-01 HISTORY — DX: Unspecified hemorrhoids: K64.9

## 2018-02-01 SURGERY — COLONOSCOPY WITH PROPOFOL
Anesthesia: General

## 2018-02-01 MED ORDER — PROPOFOL 500 MG/50ML IV EMUL
INTRAVENOUS | Status: AC
  Filled 2018-02-01: qty 50

## 2018-02-01 MED ORDER — PROPOFOL 500 MG/50ML IV EMUL
INTRAVENOUS | Status: DC | PRN
  Administered 2018-02-01: 150 ug/kg/min via INTRAVENOUS

## 2018-02-01 MED ORDER — LIDOCAINE HCL (CARDIAC) PF 100 MG/5ML IV SOSY
PREFILLED_SYRINGE | INTRAVENOUS | Status: DC | PRN
  Administered 2018-02-01: 100 mg via INTRAVENOUS

## 2018-02-01 MED ORDER — PHENYLEPHRINE HCL 10 MG/ML IJ SOLN
INTRAMUSCULAR | Status: DC | PRN
  Administered 2018-02-01 (×2): 150 ug via INTRAVENOUS
  Administered 2018-02-01: 100 ug via INTRAVENOUS

## 2018-02-01 MED ORDER — PROPOFOL 10 MG/ML IV BOLUS
INTRAVENOUS | Status: DC | PRN
  Administered 2018-02-01: 10 mg via INTRAVENOUS
  Administered 2018-02-01: 50 mg via INTRAVENOUS

## 2018-02-01 MED ORDER — SODIUM CHLORIDE 0.9 % IV SOLN
INTRAVENOUS | Status: DC
  Administered 2018-02-01: 09:00:00 via INTRAVENOUS

## 2018-02-01 MED ORDER — FENTANYL CITRATE (PF) 100 MCG/2ML IJ SOLN
INTRAMUSCULAR | Status: DC | PRN
  Administered 2018-02-01: 100 ug via INTRAVENOUS

## 2018-02-01 MED ORDER — FENTANYL CITRATE (PF) 100 MCG/2ML IJ SOLN
INTRAMUSCULAR | Status: AC
  Filled 2018-02-01: qty 2

## 2018-02-01 NOTE — Anesthesia Preprocedure Evaluation (Signed)
Anesthesia Evaluation  Patient identified by MRN, date of birth, ID band Patient awake    Reviewed: Allergy & Precautions, H&P , NPO status , reviewed documented beta blocker date and time   Airway Mallampati: II  TM Distance: >3 FB Neck ROM: full    Dental  (+) Teeth Intact   Pulmonary former smoker,    Pulmonary exam normal        Cardiovascular hypertension, Normal cardiovascular exam     Neuro/Psych  Headaches,    GI/Hepatic GERD  ,  Endo/Other    Renal/GU      Musculoskeletal  (+) Arthritis ,   Abdominal   Peds  Hematology   Anesthesia Other Findings Past Medical History: No date: Bone spur     Comment:  LEFT SHOULDER THAT MAKES HIS LEFT ARM TINGLE - had               removed 09/2017: Diverticulitis large intestine No date: GERD (gastroesophageal reflux disease) No date: Hemorrhoids No date: Hyperlipidemia No date: Hypertension No date: Migraines     Comment:  rare migraines  Past Surgical History: 01/21/2017: INGUINAL HERNIA REPAIR; Bilateral     Comment:  Procedure: LAPAROSCOPIC BILATERAL INGUINAL HERNIA               REPAIR;  Surgeon: Jules Husbands, MD;  Location: [facility]               ORS;  Service: General;  Laterality: Bilateral; No date: KNEE SURGERY; Left 07/05/2017: SHOULDER ARTHROSCOPY WITH DISTAL CLAVICLE RESECTION; Left     Comment:  Procedure: LEFT SHOULDER ARTHROSCOPY, SUBACROMIAL               DECOMPRESSION, DISTAL CLAVICLE RESECTION, POSSIBLE MINI               OPEN ROTATOR CUFF REPAIR;  Surgeon: [name],               MD;  Location: [place];  Service: Orthopedics;  Laterality:              Left; 12/13/2017: SHOULDER CLOSED REDUCTION; Left     Comment:  Procedure: LEFT SHOULDER MANIPULATION with steroid               injection;  Surgeon: [name], MD;  Location:               [place];  Service: Orthopedics;  Laterality: Left; No date: SHOULDER SURGERY; Right 12/19/6946:  UMBILICAL HERNIA REPAIR; N/A     Comment:  Procedure: HERNIA REPAIR UMBILICAL ADULT;  Surgeon:               Jules Husbands, MD;  Location: [facility];  Service:               General;  Laterality: N/A;     Reproductive/Obstetrics                             Anesthesia Physical Anesthesia Plan  ASA: II  Anesthesia Plan: General   Post-op Pain Management:    Induction:   PONV Risk Score and Plan: 2 and Treatment may vary due to age or medical condition and TIVA  Airway Management Planned:   Additional Equipment:   Intra-op Plan:   Post-operative Plan:   Informed Consent: I have reviewed the patients History and Physical, chart, labs and discussed the procedure including the risks, benefits and alternatives for  the proposed anesthesia with the patient or authorized representative who has indicated his/her understanding and acceptance.   Dental Advisory Given  Plan Discussed with: CRNA  Anesthesia Plan Comments:         Anesthesia Quick Evaluation

## 2018-02-01 NOTE — Op Note (Signed)
[facility] Patient Name: Leopold Smyers Procedure Date: 02/01/2018 9:16 AM MRN: [id number] Account #: [id number] Date of Birth: 1970-11-25 Admit Type: Outpatient Age: 47 Room: [facility] ENDO ROOM 1 Gender: Male Note Status: Finalized Procedure:            Upper GI endoscopy Indications:          Heartburn, Chronic cough Providers:            Gisella Alwine B. Bonna Gains MD, MD Referring MD:         Volney American (Referring MD) Medicines:            Monitored Anesthesia Care Complications:        No immediate complications. Procedure:            Pre-Anesthesia Assessment:                       - Prior to the procedure, a History and Physical was                        performed, and patient medications, allergies and                        sensitivities were reviewed. The patient's tolerance of                        previous anesthesia was reviewed.                       - The risks and benefits of the procedure and the                        sedation options and risks were discussed with the                        patient. All questions were answered and informed                        consent was obtained.                       - Patient identification and proposed procedure were                        verified prior to the procedure by the physician, the                        nurse, the anesthesiologist, the anesthetist and the                        technician. The procedure was verified in the procedure                        room.                       - ASA Grade Assessment: II - A patient with mild                        systemic disease.  After obtaining informed consent, the endoscope was                        passed under direct vision. Throughout the procedure,                        the patient's blood pressure, pulse, and oxygen                        saturations were monitored continuously. The Endoscope                was introduced through the mouth, and advanced to the                        third part of duodenum. The upper GI endoscopy was                        accomplished with ease. The patient tolerated the                        procedure well. Findings:      The examined esophagus was normal.      The Z-line was regular and was found 39 cm from the incisors.      Patchy mildly erythematous mucosa without bleeding was found in the       gastric antrum. Biopsies were taken with a cold forceps for histology.       Biopsies were obtained in the gastric body, at the incisura and in the       gastric antrum with cold forceps for histology.      The duodenal bulb, second portion of the duodenum, third portion of the       duodenum and examined duodenum were normal. Impression:           - Normal esophagus.                       - Z-line regular, 39 cm from the incisors.                       - Erythematous mucosa in the antrum. Biopsied.                       - Normal duodenal bulb, second portion of the duodenum,                        third portion of the duodenum and examined duodenum.                       - Biopsies were obtained in the gastric body, at the                        incisura and in the gastric antrum. Recommendation:       - Await pathology results.                       - Discharge patient to home (with escort).                       - Advance diet as tolerated.                       -  Continue present medications.                       - Patient has a contact number available for                        emergencies. The signs and symptoms of potential                        delayed complications were discussed with the patient.                        Return to normal activities tomorrow. Written discharge                        instructions were provided to the patient.                       - Discharge patient to home (with escort).                       - The  findings and recommendations were discussed with                        the patient.                       - The findings and recommendations were discussed with                        the patient's family. Procedure Code(s):    --- Professional ---                       [phone], Esophagogastroduodenoscopy, flexible, transoral;                        with biopsy, single or multiple Diagnosis Code(s):    --- Professional ---                       K31.89, Other diseases of stomach and duodenum                       R12, Heartburn                       R05, Cough CPT copyright 2017 American Medical Association. All rights reserved. The codes documented in this report are preliminary and upon coder review may  be revised to meet current compliance requirements.  [name], MD Margretta Sidle B. Bonna Gains MD, MD 02/01/2018 9:39:37 AM This report has been signed electronically. Number of Addenda: 0 Note Initiated On: 02/01/2018 9:16 AM Estimated Blood Loss: Estimated blood loss: none.      [facility]

## 2018-02-01 NOTE — Anesthesia Postprocedure Evaluation (Signed)
Anesthesia Post Note  Patient: Cedrick Partain  Procedure(s) Performed: COLONOSCOPY WITH PROPOFOL (N/A ) ESOPHAGOGASTRODUODENOSCOPY (EGD) WITH PROPOFOL (N/A )  Patient location during evaluation: Endoscopy Anesthesia Type: General Level of consciousness: awake and alert Pain management: pain level controlled Vital Signs Assessment: post-procedure vital signs reviewed and stable Respiratory status: spontaneous breathing, nonlabored ventilation and respiratory function stable Cardiovascular status: blood pressure returned to baseline and stable Postop Assessment: no apparent nausea or vomiting Anesthetic complications: no     Last Vitals:  Vitals:   02/01/18 1020 02/01/18 1030  BP: 100/81 99/86  Pulse: 86 82  Resp: 12 15  Temp:    SpO2: 95% 96%    Last Pain:  Vitals:   02/01/18 0830  PainSc: 1                  Sricharan Lacomb Harvie Heck

## 2018-02-01 NOTE — H&P (Signed)
Keith Antigua, MD [address] [place], [place], [name], [address] [address], [place], [place], [name], [address] Phone: [phone]  Fax: [phone]  Primary Care Physician:  Volney American, PA-C   Pre-Procedure History & Physical: HPI:  Keith Barber is a 47 y.o. male is here for a colonoscopy and EGD.   Past Medical History:  Diagnosis Date  . Bone spur    LEFT SHOULDER THAT MAKES HIS LEFT ARM TINGLE - had removed  . Diverticulitis large intestine 09/2017  . GERD (gastroesophageal reflux disease)   . Hemorrhoids   . Hyperlipidemia   . Hypertension   . Migraines    rare migraines    Past Surgical History:  Procedure Laterality Date  . INGUINAL HERNIA REPAIR Bilateral 01/21/2017   Procedure: LAPAROSCOPIC BILATERAL INGUINAL HERNIA REPAIR;  Surgeon: Jules Husbands, MD;  Location: [facility];  Service: General;  Laterality: Bilateral;  . KNEE SURGERY Left   . SHOULDER ARTHROSCOPY WITH DISTAL CLAVICLE RESECTION Left 07/05/2017   Procedure: LEFT SHOULDER ARTHROSCOPY, SUBACROMIAL DECOMPRESSION, DISTAL CLAVICLE RESECTION, POSSIBLE MINI OPEN ROTATOR CUFF REPAIR;  Surgeon: [name], MD;  Location: [facility];  Service: Orthopedics;  Laterality: Left;  . SHOULDER CLOSED REDUCTION Left 12/13/2017   Procedure: LEFT SHOULDER MANIPULATION with steroid injection;  Surgeon: [name], MD;  Location: [facility];  Service: Orthopedics;  Laterality: Left;  . SHOULDER SURGERY Right   . UMBILICAL HERNIA REPAIR N/A 01/21/2017   Procedure: HERNIA REPAIR UMBILICAL ADULT;  Surgeon: Jules Husbands, MD;  Location: [facility];  Service: General;  Laterality: N/A;    Prior to Admission medications   Medication Sig Start Date End Date Taking? Authorizing Provider  azelastine (ASTELIN) 0.1 % nasal spray USE 1 SPRAY IN EACH NOSTRIL EVERY 12 HOURS AS NEEDED FOR ALLERGIES 01/09/18  Yes [provider]  budesonide-formoterol (SYMBICORT) 160-4.5 MCG/ACT inhaler Inhale  2 puffs into the lungs 2 (two) times daily. 10/27/17  Yes Volney American, PA-C  calcium carbonate (TUMS EX) 750 MG chewable tablet Chew 2 tablets by mouth daily as needed for heartburn.   Yes [provider]  FIBER PO Take 1 capsule by mouth daily.   Yes [provider]  loperamide (IMODIUM) 2 MG capsule Take 2-4 mg by mouth as needed for diarrhea or loose stools.   Yes [provider]  losartan (COZAAR) 50 MG tablet Take 1 tablet (50 mg total) by mouth daily. 01/10/18  Yes Johnson, Megan P, DO  naproxen sodium (ALEVE) 220 MG tablet Take 440 mg by mouth daily as needed (for pain or headache).    Yes [provider]  omeprazole (PRILOSEC) 20 MG capsule Take 1 capsule (20 mg total) by mouth daily. 12/26/17  Yes Kathrine Haddock, NP  ondansetron (ZOFRAN ODT) 4 MG disintegrating tablet Take 1 tablet (4 mg total) by mouth every 8 (eight) hours as needed for nausea or vomiting. 10/25/17  Yes Johnson, Megan P, DO  rizatriptan (MAXALT) 10 MG tablet Take 1 tablet (10 mg total) by mouth as needed for migraine. 10/19/17  Yes Volney American, PA-C  rosuvastatin (CRESTOR) 10 MG tablet TAKE 1 TABLET BY MOUTH ONCE DAILY 11/29/17  Yes Johnson, Megan P, DO  terbinafine (LAMISIL) 250 MG tablet Take 1 tablet (250 mg total) by mouth daily. 12/26/17  Yes Kathrine Haddock, NP  HYDROcodone-acetaminophen (NORCO/VICODIN) 5-325 MG tablet Take 1 tablet by mouth every 6 (six) hours as needed for moderate pain. Patient not taking: Reported on 01/20/2018  01/03/18   Fisher, Linden Dolin, PA-C  pantoprazole (PROTONIX) 40 MG tablet TAKE 1 TABLET BY MOUTH IN THE MORNING 30 MINUTES PRIOR TO FIRST MEAL OF THE DAY 01/09/18   [provider]  polyethylene glycol-electrolytes (NULYTELY/GOLYTELY) 420 g solution Use as directed, split in half-2 doses Patient not taking: Reported on 01/20/2018 01/02/18   Virgel Manifold, MD  ranitidine (ZANTAC) 75 MG tablet Take 75 mg by mouth daily.     [provider]    Allergies as of 01/02/2018  . (No Known Allergies)    Family History  Problem Relation Age of Onset  . Diabetes Mother   . Migraines Mother   . Cancer Neg Hx   . COPD Neg Hx   . Heart disease Neg Hx   . Stroke Neg Hx     Social History   Socioeconomic History  . Marital status: Divorced    Spouse name: Not on file  . Number of children: Not on file  . Years of education: Not on file  . Highest education level: Not on file  Occupational History  . Not on file  Social Needs  . Financial resource strain: Not on file  . Food insecurity:    Worry: Not on file    Inability: Not on file  . Transportation needs:    Medical: Not on file    Non-medical: Not on file  Tobacco Use  . Smoking status: Former Smoker    Packs/day: 0.50    Years: 26.00    Pack years: 13.00    Types: Cigarettes    Last attempt to quit: 01/13/2017    Years since quitting: 1.0  . Smokeless tobacco: Never Used  . Tobacco comment: PT STARTED TAKING ZYBAN ON 01-08-17 IN HOPES OF QUITTING  Substance and Sexual Activity  . Alcohol use: No  . Drug use: No  . Sexual activity: Not on file  Lifestyle  . Physical activity:    Days per week: Not on file    Minutes per session: Not on file  . Stress: Not on file  Relationships  . Social connections:    Talks on phone: Not on file    Gets together: Not on file    Attends religious service: Not on file    Active member of club or organization: Not on file    Attends meetings of clubs or organizations: Not on file    Relationship status: Not on file  . Intimate partner violence:    Fear of current or ex partner: Not on file    Emotionally abused: Not on file    Physically abused: Not on file    Forced sexual activity: Not on file  Other Topics Concern  . Not on file  Social History Narrative  . Not on file    Review of Systems: See HPI, otherwise negative ROS  Physical Exam: BP (!) 135/101   Pulse 79   Temp (!) 97.5 F (36.4  C)   Resp 18   Ht 5\' 8"  (1.727 m)   Wt 206 lb (93.4 kg)   SpO2 97%   BMI 31.32 kg/m  General:   Alert,  pleasant and cooperative in NAD Head:  Normocephalic and atraumatic. Neck:  Supple; no masses or thyromegaly. Lungs:  Clear throughout to auscultation, normal respiratory effort.    Heart:  +S1, +S2, Regular rate and rhythm, No edema. Abdomen:  Soft, nontender and nondistended. Normal bowel sounds, without guarding, and without rebound.  Neurologic:  Alert and  oriented x4;  grossly normal neurologically.  Impression/Plan: Zabian Swayne is here for a colonoscopy to be performed for average risk screening and EGD for Acid Reflux.  Risks, benefits, limitations, and alternatives regarding the procedures have been reviewed with the patient.  Questions have been answered.  All parties agreeable.   Virgel Manifold, MD  02/01/2018, 9:14 AM

## 2018-02-01 NOTE — Anesthesia Post-op Follow-up Note (Signed)
Anesthesia QCDR form completed.        

## 2018-02-01 NOTE — Transfer of Care (Signed)
Immediate Anesthesia Transfer of Care Note  Patient: Keith Barber  Procedure(s) Performed: COLONOSCOPY WITH PROPOFOL (N/A ) ESOPHAGOGASTRODUODENOSCOPY (EGD) WITH PROPOFOL (N/A )  Patient Location: PACU  Anesthesia Type:General  Level of Consciousness: sedated  Airway & Oxygen Therapy: Patient Spontanous Breathing and Patient connected to nasal cannula oxygen  Post-op Assessment: Report given to RN and Post -op Vital signs reviewed and stable  Post vital signs: Reviewed and stable  Last Vitals:  Vitals Value Taken Time  BP 94/58 02/01/2018 10:10 AM  Temp 36.4 C 02/01/2018 10:10 AM  Pulse 104 02/01/2018 10:10 AM  Resp 9 02/01/2018 10:10 AM  SpO2 95 % 02/01/2018 10:10 AM  Vitals shown include unvalidated device data.  Last Pain:  Vitals:   02/01/18 0830  PainSc: 1          Complications: No apparent anesthesia complications

## 2018-02-01 NOTE — Anesthesia Procedure Notes (Signed)
Performed by: Gilma Bessette, CRNA Pre-anesthesia Checklist: Patient identified, Emergency Drugs available, Suction available, Patient being monitored and Timeout performed Patient Re-evaluated:Patient Re-evaluated prior to induction Oxygen Delivery Method: Nasal cannula Induction Type: IV induction       

## 2018-02-01 NOTE — Op Note (Signed)
[facility] Patient Name: Zyshawn Bohnenkamp Procedure Date: 02/01/2018 9:16 AM MRN: [id number] Account #: [id number] Date of Birth: Jun 18, 1971 Admit Type: Outpatient Age: 47 Room: [facility] ENDO ROOM 1 Gender: Male Note Status: Finalized Procedure:            Colonoscopy Indications:          Screening for colorectal malignant neoplasm, Incidental                        - Diverticulitis Providers:            Edyn Popoca B. Bonna Gains MD, MD Referring MD:         Loraine Grip (Referring MD) Medicines:            Monitored Anesthesia Care Complications:        No immediate complications. Procedure:            Pre-Anesthesia Assessment:                       - ASA Grade Assessment: II - A patient with mild                        systemic disease.                       - Prior to the procedure, a History and Physical was                        performed, and patient medications, allergies and                        sensitivities were reviewed. The patient's tolerance of                        previous anesthesia was reviewed.                       - The risks and benefits of the procedure and the                        sedation options and risks were discussed with the                        patient. All questions were answered and informed                        consent was obtained.                       - Patient identification and proposed procedure were                        verified prior to the procedure by the physician, the                        nurse, the anesthesiologist, the anesthetist and the                        technician. The procedure was verified in the procedure  room.                       After obtaining informed consent, the colonoscope was                        passed under direct vision. Throughout the procedure,                        the patient's blood pressure, pulse, and oxygen   saturations were monitored continuously. The                        Colonoscope was introduced through the anus and                        advanced to the the cecum, identified by appendiceal                        orifice and ileocecal valve. The colonoscopy was                        performed with ease. The patient tolerated the                        procedure well. The quality of the bowel preparation                        was good except the cecum was fair. Findings:      Skin tags were found on perianal exam.      A 3 mm polyp was found in the recto-sigmoid colon. The polyp was flat.       The polyp was removed with a cold biopsy forceps. Resection and       retrieval were complete.      A few small-mouthed diverticula were found in the sigmoid colon. There       was no evidence of diverticular bleeding.      The exam was otherwise without abnormality.      The rectum, sigmoid colon, descending colon, transverse colon, ascending       colon and cecum appeared normal.      Internal hemorrhoids were found during retroflexion. The hemorrhoids       were large. Impression:           - Perianal skin tags found on perianal exam.                       - One 3 mm polyp at the recto-sigmoid colon, removed                        with a cold biopsy forceps. Resected and retrieved.                       - Diverticulosis in the sigmoid colon. There was no                        evidence of diverticular bleeding.                       - The examination was otherwise normal.                       -  The rectum, sigmoid colon, descending colon,                        transverse colon, ascending colon and cecum are normal.                       - Internal hemorrhoids. Recommendation:       - Discharge patient to home (with escort).                       - Advance diet as tolerated.                       - Continue present medications.                       - Await pathology results.                        - Repeat colonoscopy in 5 years for surveillance.                       - The findings and recommendations were discussed with                        the patient.                       - The findings and recommendations were discussed with                        the patient's family.                       - Return to primary care physician as previously                        scheduled.                       - High fiber diet. Procedure Code(s):    --- Professional ---                       [phone], Colonoscopy, flexible; with biopsy, single or                        multiple Diagnosis Code(s):    --- Professional ---                       Z12.11, Encounter for screening for malignant neoplasm                        of colon                       D12.7, Benign neoplasm of rectosigmoid junction                       K64.8, Other hemorrhoids                       K64.4, Residual hemorrhoidal skin tags  K57.30, Diverticulosis of large intestine without                        perforation or abscess without bleeding CPT copyright 2017 American Medical Association. All rights reserved. The codes documented in this report are preliminary and upon coder review may  be revised to meet current compliance requirements.  [name], MD Margretta Sidle B. Bonna Gains MD, MD 02/01/2018 10:11:59 AM This report has been signed electronically. Number of Addenda: 0 Note Initiated On: 02/01/2018 9:16 AM Scope Withdrawal Time: 0 hours 20 minutes 15 seconds  Total Procedure Duration: 0 hours 24 minutes 34 seconds  Estimated Blood Loss: Estimated blood loss: none.      [facility]

## 2018-02-02 ENCOUNTER — Encounter: Payer: Self-pay | Admitting: Gastroenterology

## 2018-02-02 LAB — SURGICAL PATHOLOGY

## 2018-02-06 ENCOUNTER — Encounter: Payer: Self-pay | Admitting: Gastroenterology

## 2018-02-21 ENCOUNTER — Ambulatory Visit: Payer: BLUE CROSS/BLUE SHIELD | Admitting: Family Medicine

## 2018-02-23 ENCOUNTER — Ambulatory Visit (INDEPENDENT_AMBULATORY_CARE_PROVIDER_SITE_OTHER): Payer: BLUE CROSS/BLUE SHIELD | Admitting: Family Medicine

## 2018-02-23 ENCOUNTER — Other Ambulatory Visit: Payer: Self-pay

## 2018-02-23 ENCOUNTER — Encounter: Payer: Self-pay | Admitting: Family Medicine

## 2018-02-23 VITALS — BP 119/84 | HR 83 | Temp 98.6°F | Ht 68.0 in | Wt 213.4 lb

## 2018-02-23 DIAGNOSIS — R12 Heartburn: Secondary | ICD-10-CM | POA: Diagnosis not present

## 2018-02-23 DIAGNOSIS — Z23 Encounter for immunization: Secondary | ICD-10-CM

## 2018-02-23 DIAGNOSIS — Z114 Encounter for screening for human immunodeficiency virus [HIV]: Secondary | ICD-10-CM | POA: Diagnosis not present

## 2018-02-23 DIAGNOSIS — E782 Mixed hyperlipidemia: Secondary | ICD-10-CM

## 2018-02-23 DIAGNOSIS — I10 Essential (primary) hypertension: Secondary | ICD-10-CM | POA: Diagnosis not present

## 2018-02-23 NOTE — Progress Notes (Signed)
BP 119/84   Pulse 83   Temp 98.6 F (37 C) (Oral)   Ht 5\' 8"  (1.727 m)   Wt 213 lb 6.4 oz (96.8 kg)   BMI 32.45 kg/m    Subjective:    Patient ID: Keith Barber, male    DOB: 1971-05-06, 47 y.o.   MRN: [id number]  HPI: Keith Barber is a 47 y.o. male  Chief Complaint  Patient presents with  . Shoulder Pain    left side/ pt states shoulder still hurts a little  . Hypertension  . Hyperlipidemia   Pt here today for 6 month f/u BP and Chol. Taking medications with good compliance and no reported side effects. Home BPs WNL when checked. Denies CP, SOB, syncope, HAs myalgias, claudication. Continues to work on lifestyle modifications.   Has failed prilosec and protonix at 40 mg dose, per GI he's trying dexilant samples right now and if no benefit will double protonix.   Getting second opinion from [facility] on his left shoulder. Having significant pain radiating down arm into forearm s/p left shoulder surgery 8 months ago. Already has appt scheduled.   Past Medical History:  Diagnosis Date  . Bone spur    LEFT SHOULDER THAT MAKES HIS LEFT ARM TINGLE - had removed  . Diverticulitis large intestine 09/2017  . GERD (gastroesophageal reflux disease)   . Hemorrhoids   . Hyperlipidemia   . Hypertension   . Migraines    rare migraines   Social History   Socioeconomic History  . Marital status: Divorced    Spouse name: Not on file  . Number of children: Not on file  . Years of education: Not on file  . Highest education level: Not on file  Occupational History  . Not on file  Social Needs  . Financial resource strain: Not on file  . Food insecurity:    Worry: Not on file    Inability: Not on file  . Transportation needs:    Medical: Not on file    Non-medical: Not on file  Tobacco Use  . Smoking status: Former Smoker    Packs/day: 0.50    Years: 26.00    Pack years: 13.00    Types: Cigarettes    Last attempt to quit: 01/13/2017    Years since  quitting: 1.1  . Smokeless tobacco: Never Used  . Tobacco comment: PT STARTED TAKING ZYBAN ON 01-08-17 IN HOPES OF QUITTING  Substance and Sexual Activity  . Alcohol use: No  . Drug use: No  . Sexual activity: Not on file  Lifestyle  . Physical activity:    Days per week: Not on file    Minutes per session: Not on file  . Stress: Not on file  Relationships  . Social connections:    Talks on phone: Not on file    Gets together: Not on file    Attends religious service: Not on file    Active member of club or organization: Not on file    Attends meetings of clubs or organizations: Not on file    Relationship status: Not on file  . Intimate partner violence:    Fear of current or ex partner: Not on file    Emotionally abused: Not on file    Physically abused: Not on file    Forced sexual activity: Not on file  Other Topics Concern  . Not on file  Social History Narrative  . Not on file  Relevant past medical, surgical, family and social history reviewed and updated as indicated. Interim medical history since our last visit reviewed. Allergies and medications reviewed and updated.  Review of Systems  Per HPI unless specifically indicated above     Objective:    BP 119/84   Pulse 83   Temp 98.6 F (37 C) (Oral)   Ht 5\' 8"  (1.727 m)   Wt 213 lb 6.4 oz (96.8 kg)   BMI 32.45 kg/m   Wt Readings from Last 3 Encounters:  02/23/18 213 lb 6.4 oz (96.8 kg)  02/01/18 206 lb (93.4 kg)  01/20/18 209 lb (94.8 kg)    Physical Exam  Constitutional: He is oriented to person, place, and time. He appears well-developed and well-nourished. No distress.  HENT:  Head: Atraumatic.  Eyes: Conjunctivae and EOM are normal.  Neck: Normal range of motion. Neck supple.  Cardiovascular: Normal rate and regular rhythm.  Pulmonary/Chest: Effort normal and breath sounds normal. No respiratory distress.  Musculoskeletal: Normal range of motion.  Reduced ROM left shoulder  Neurological: He  is alert and oriented to person, place, and time.  Skin: Skin is warm and dry.  Nursing note and vitals reviewed.   Results for orders placed or performed in visit on 02/23/18  HIV antibody  Result Value Ref Range   HIV Screen 4th Generation wRfx Non Reactive Non Reactive  Comprehensive metabolic panel  Result Value Ref Range   Glucose 110 (H) 65 - 99 mg/dL   BUN 9 6 - 24 mg/dL   Creatinine, Ser 1.18 0.76 - 1.27 mg/dL   GFR calc non Af Amer 73 >59 mL/min/1.73   GFR calc Af Amer 84 >59 mL/min/1.73   BUN/Creatinine Ratio 8 (L) 9 - 20   Sodium 142 134 - 144 mmol/L   Potassium 4.4 3.5 - 5.2 mmol/L   Chloride 101 96 - 106 mmol/L   CO2 23 20 - 29 mmol/L   Calcium 9.5 8.7 - 10.2 mg/dL   Total Protein 7.5 6.0 - 8.5 g/dL   Albumin 4.8 3.5 - 5.5 g/dL   Globulin, Total 2.7 1.5 - 4.5 g/dL   Albumin/Globulin Ratio 1.8 1.2 - 2.2   Bilirubin Total 0.6 0.0 - 1.2 mg/dL   Alkaline Phosphatase 145 (H) 39 - 117 IU/L   AST 35 0 - 40 IU/L   ALT 61 (H) 0 - 44 IU/L  Lipid Panel w/o Chol/HDL Ratio  Result Value Ref Range   Cholesterol, Total 154 100 - 199 mg/dL   Triglycerides 311 (H) 0 - 149 mg/dL   HDL 35 (L) >39 mg/dL   VLDL Cholesterol Cal 62 (H) 5 - 40 mg/dL   LDL Calculated 57 0 - 99 mg/dL      Assessment & Plan:   Problem List Items Addressed This Visit      Cardiovascular and Mediastinum   Essential hypertension    Under fairly good control. Suspect mild elevation due to uncontrolled shoulder/arm pain. Will continue to monitor. Continue current regimen      Relevant Orders   Comprehensive metabolic panel (Completed)     Other   Hyperlipidemia    Recheck lipids and adjust as needed. Continue current regimen for now as well as lifestyle modifications      Relevant Orders   Lipid Panel w/o Chol/HDL Ratio (Completed)   Heartburn    Trialing dexilant currently, await response. Continue working on lifestyle modifications for better control as well. F/u with GI if no better  Other Visit Diagnoses    Need for diphtheria-tetanus-pertussis (Tdap) vaccine    -  Primary   Relevant Orders   Tdap vaccine greater than or equal to 7yo IM (Completed)   Screening for HIV (human immunodeficiency virus)       Relevant Orders   HIV antibody (Completed)       Follow up plan: Return in about 6 months (around 08/26/2018) for CPE.

## 2018-02-24 ENCOUNTER — Other Ambulatory Visit: Payer: Self-pay | Admitting: Family Medicine

## 2018-02-24 DIAGNOSIS — R7301 Impaired fasting glucose: Secondary | ICD-10-CM

## 2018-02-24 LAB — COMPREHENSIVE METABOLIC PANEL
ALK PHOS: 145 IU/L — AB (ref 39–117)
ALT: 61 IU/L — ABNORMAL HIGH (ref 0–44)
AST: 35 IU/L (ref 0–40)
Albumin/Globulin Ratio: 1.8 (ref 1.2–2.2)
Albumin: 4.8 g/dL (ref 3.5–5.5)
BUN/Creatinine Ratio: 8 — ABNORMAL LOW (ref 9–20)
BUN: 9 mg/dL (ref 6–24)
Bilirubin Total: 0.6 mg/dL (ref 0.0–1.2)
CO2: 23 mmol/L (ref 20–29)
CREATININE: 1.18 mg/dL (ref 0.76–1.27)
Calcium: 9.5 mg/dL (ref 8.7–10.2)
Chloride: 101 mmol/L (ref 96–106)
GFR calc Af Amer: 84 mL/min/{1.73_m2} (ref >59)
GFR calc non Af Amer: 73 mL/min/{1.73_m2} (ref >59)
GLOBULIN, TOTAL: 2.7 g/dL (ref 1.5–4.5)
Glucose: 110 mg/dL — ABNORMAL HIGH (ref 65–99)
POTASSIUM: 4.4 mmol/L (ref 3.5–5.2)
SODIUM: 142 mmol/L (ref 134–144)
Total Protein: 7.5 g/dL (ref 6.0–8.5)

## 2018-02-24 LAB — LIPID PANEL W/O CHOL/HDL RATIO
CHOLESTEROL TOTAL: 154 mg/dL (ref 100–199)
HDL: 35 mg/dL — ABNORMAL LOW (ref >39)
LDL Calculated: 57 mg/dL (ref 0–99)
Triglycerides: 311 mg/dL — ABNORMAL HIGH (ref 0–149)
VLDL Cholesterol Cal: 62 mg/dL — ABNORMAL HIGH (ref 5–40)

## 2018-02-24 LAB — HIV ANTIBODY (ROUTINE TESTING W REFLEX): HIV SCREEN 4TH GENERATION: NONREACTIVE

## 2018-03-02 NOTE — Patient Instructions (Signed)
Follow up for CPE 

## 2018-03-02 NOTE — Assessment & Plan Note (Signed)
Recheck lipids and adjust as needed. Continue current regimen for now as well as lifestyle modifications

## 2018-03-02 NOTE — Assessment & Plan Note (Signed)
Trialing dexilant currently, await response. Continue working on lifestyle modifications for better control as well. F/u with GI if no better

## 2018-03-02 NOTE — Assessment & Plan Note (Signed)
Under fairly good control. Suspect mild elevation due to uncontrolled shoulder/arm pain. Will continue to monitor. Continue current regimen

## 2018-04-12 ENCOUNTER — Ambulatory Visit: Payer: Self-pay | Admitting: *Deleted

## 2018-04-12 NOTE — Telephone Encounter (Signed)
Pt reports BP this am 137/100 after taking BP medication.  States yesterday am and pm 138/96. Reports lightheaded yesterday, mild headache, and "Had to wear my glasses."  Asymptomatic  today. Reports had cortisone injection lower back last Thursday. Also reports saw ENT physician in June who took him off BP med previously prescribed (lisinopril?) and ordered Losartan. Pt states ran out of losartan; missed doses on Sun and Mon; Tues and today took the lisinopril he had left over.  Also reports mild swelling right side of neck when lying down, mild pain right side of abdomen, intermittent.. And soreness behind knee of left leg yesterday, not today. Appt made with R. [facility] by agent prior to NT for Friday afternoon , time and day per pt's request. Care advise given per protocol.  Reason for Disposition . [1] Taking BP medications AND [2] feels is having side effects (e.g., impotence, cough, dizzy upon standing)  Answer Assessment - Initial Assessment Questions 1. BLOOD PRESSURE: "What is the blood pressure?" "Did you take at least two measurements 5 minutes apart?"     No; Pt is at work. Took this am at home; 137/100...136/96 x 2 yesterday. 2. ONSET: "When did you take your blood pressure?"     This AM after taking BP med.  3. HOW: "How did you obtain the blood pressure?" (e.g., visiting nurse, automatic home BP monitor)     Home monitor, arm cuff 4. HISTORY: "Do you have a history of high blood pressure?"     Yes 5. MEDICATIONS: "Are you taking any medications for blood pressure?" "Have you missed any doses recently?"     Yes, missed Sun and Mon doses 6. OTHER SYMPTOMS: "Do you have any symptoms?" (e.g., headache, chest pain, blurred vision, difficulty breathing, weakness)     "Had to wear glasses yesterday, lightheaded yesterday, no symptoms today.  Protocols used: HIGH BLOOD PRESSURE-A-AH

## 2018-04-13 ENCOUNTER — Other Ambulatory Visit: Payer: Self-pay

## 2018-04-13 ENCOUNTER — Encounter: Payer: Self-pay | Admitting: Family Medicine

## 2018-04-13 ENCOUNTER — Ambulatory Visit (INDEPENDENT_AMBULATORY_CARE_PROVIDER_SITE_OTHER): Payer: BLUE CROSS/BLUE SHIELD | Admitting: Family Medicine

## 2018-04-13 VITALS — BP 111/80 | HR 96 | Temp 98.1°F | Ht 68.0 in | Wt 211.0 lb

## 2018-04-13 DIAGNOSIS — M545 Low back pain: Secondary | ICD-10-CM

## 2018-04-13 DIAGNOSIS — R7301 Impaired fasting glucose: Secondary | ICD-10-CM | POA: Diagnosis not present

## 2018-04-13 DIAGNOSIS — R42 Dizziness and giddiness: Secondary | ICD-10-CM

## 2018-04-13 DIAGNOSIS — I10 Essential (primary) hypertension: Secondary | ICD-10-CM | POA: Diagnosis not present

## 2018-04-13 DIAGNOSIS — G8929 Other chronic pain: Secondary | ICD-10-CM

## 2018-04-13 DIAGNOSIS — G43109 Migraine with aura, not intractable, without status migrainosus: Secondary | ICD-10-CM

## 2018-04-13 MED ORDER — LOSARTAN POTASSIUM 50 MG PO TABS: 50.0000 mg | ORAL_TABLET | Freq: Every day | ORAL | 1 refills | Status: DC

## 2018-04-13 NOTE — Patient Instructions (Signed)
Follow up as scheduled.  

## 2018-04-13 NOTE — Progress Notes (Signed)
BP 111/80   Pulse 96   Temp 98.1 F (36.7 C) (Oral)   Ht 5\' 8"  (1.727 m)   Wt 211 lb (95.7 kg)   SpO2 96%   BMI 32.08 kg/m    Subjective:    Patient ID: Keith Barber, male    DOB: 09/05/1970, 47 y.o.   MRN: [id number]  HPI: Keith Barber is a 47 y.o. male  Chief Complaint  Patient presents with  . Hypertension    pt states his blood pressure has been elevated lately  . Headache  . Dizziness   Here today with several concerns.   Saw GI recently, who had put him on losartan since they attributed some of his cough/dysphagia to ACEi. Ran out the past week and had to restart lisinopril. BPs variable since changing back temporarily but for the most part controlled. Taking dexilant which is helping with his reflux some.   Went to [facility] for back pain and second opinion on left shoulder. Had a low back injection for degenerative discs which only helped for a week. Following up in a few more weeks with them.   HAs, ocular migraines, two short episodes of dizziness recently. Right sided neck soreness and pulling as well. Ran out of his flexeril and is having the back pain again now - thinks this may be causing his sxs. Still taking the robaxin. Maxalt prn helping.   Past Medical History:  Diagnosis Date  . Bone spur    LEFT SHOULDER THAT MAKES HIS LEFT ARM TINGLE - had removed  . Diverticulitis large intestine 09/2017  . GERD (gastroesophageal reflux disease)   . Hemorrhoids   . Hyperlipidemia   . Hypertension   . Migraines    rare migraines   Social History   Socioeconomic History  . Marital status: Divorced    Spouse name: Not on file  . Number of children: Not on file  . Years of education: Not on file  . Highest education level: Not on file  Occupational History  . Not on file  Social Needs  . Financial resource strain: Not on file  . Food insecurity:    Worry: Not on file    Inability: Not on file  . Transportation needs:   Medical: Not on file    Non-medical: Not on file  Tobacco Use  . Smoking status: Former Smoker    Packs/day: 0.50    Years: 26.00    Pack years: 13.00    Types: Cigarettes    Last attempt to quit: 01/13/2017    Years since quitting: 1.2  . Smokeless tobacco: Never Used  . Tobacco comment: PT STARTED TAKING ZYBAN ON 01-08-17 IN HOPES OF QUITTING  Substance and Sexual Activity  . Alcohol use: No  . Drug use: No  . Sexual activity: Not on file  Lifestyle  . Physical activity:    Days per week: Not on file    Minutes per session: Not on file  . Stress: Not on file  Relationships  . Social connections:    Talks on phone: Not on file    Gets together: Not on file    Attends religious service: Not on file    Active member of club or organization: Not on file    Attends meetings of clubs or organizations: Not on file    Relationship status: Not on file  . Intimate partner violence:    Fear of current or ex partner: Not on file  Emotionally abused: Not on file    Physically abused: Not on file    Forced sexual activity: Not on file  Other Topics Concern  . Not on file  Social History Narrative  . Not on file   Relevant past medical, surgical, family and social history reviewed and updated as indicated. Interim medical history since our last visit reviewed. Allergies and medications reviewed and updated.  Review of Systems  Per HPI unless specifically indicated above     Objective:    BP 111/80   Pulse 96   Temp 98.1 F (36.7 C) (Oral)   Ht 5\' 8"  (1.727 m)   Wt 211 lb (95.7 kg)   SpO2 96%   BMI 32.08 kg/m   Wt Readings from Last 3 Encounters:  04/13/18 211 lb (95.7 kg)  02/23/18 213 lb 6.4 oz (96.8 kg)  02/01/18 206 lb (93.4 kg)    Physical Exam  Constitutional: He is oriented to person, place, and time. He appears well-developed and well-nourished. No distress.  HENT:  Head: Atraumatic.  Eyes: Pupils are equal, round, and reactive to light. Conjunctivae and  EOM are normal.  Neck: Normal range of motion. Neck supple.  Cardiovascular: Normal rate, regular rhythm and normal heart sounds.  Pulmonary/Chest: Effort normal and breath sounds normal.  Musculoskeletal: Normal range of motion.  Neurological: He is alert and oriented to person, place, and time. No cranial nerve deficit.  Skin: Skin is warm and dry.  Psychiatric: He has a normal mood and affect. His behavior is normal.  Nursing note and vitals reviewed.   Results for orders placed or performed in visit on 02/23/18  HIV antibody  Result Value Ref Range   HIV Screen 4th Generation wRfx Non Reactive Non Reactive  Comprehensive metabolic panel  Result Value Ref Range   Glucose 110 (H) 65 - 99 mg/dL   BUN 9 6 - 24 mg/dL   Creatinine, Ser 1.18 0.76 - 1.27 mg/dL   GFR calc non Af Amer 73 >59 mL/min/1.73   GFR calc Af Amer 84 >59 mL/min/1.73   BUN/Creatinine Ratio 8 (L) 9 - 20   Sodium 142 134 - 144 mmol/L   Potassium 4.4 3.5 - 5.2 mmol/L   Chloride 101 96 - 106 mmol/L   CO2 23 20 - 29 mmol/L   Calcium 9.5 8.7 - 10.2 mg/dL   Total Protein 7.5 6.0 - 8.5 g/dL   Albumin 4.8 3.5 - 5.5 g/dL   Globulin, Total 2.7 1.5 - 4.5 g/dL   Albumin/Globulin Ratio 1.8 1.2 - 2.2   Bilirubin Total 0.6 0.0 - 1.2 mg/dL   Alkaline Phosphatase 145 (H) 39 - 117 IU/L   AST 35 0 - 40 IU/L   ALT 61 (H) 0 - 44 IU/L  Lipid Panel w/o Chol/HDL Ratio  Result Value Ref Range   Cholesterol, Total 154 100 - 199 mg/dL   Triglycerides 311 (H) 0 - 149 mg/dL   HDL 35 (L) >39 mg/dL   VLDL Cholesterol Cal 62 (H) 5 - 40 mg/dL   LDL Calculated 57 0 - 99 mg/dL      Assessment & Plan:   Problem List Items Addressed This Visit      Cardiovascular and Mediastinum   Essential hypertension - Primary    Refill losartan, continue checking BPs and monitoring closely to see if sxs resolve. Hydrate well, rest, DASH diet.       Relevant Medications   losartan (COZAAR) 50 MG tablet   Migraines  Possibly triggered from  rebound back/neck pain and muscle soreness but will monitor closely. Maxalt prn for now. Call if worsening      Relevant Medications   methocarbamol (ROBAXIN) 500 MG tablet   cyclobenzaprine (FLEXERIL) 5 MG tablet   losartan (COZAAR) 50 MG tablet    Other Visit Diagnoses    Impaired fasting glucose       Checking A1C today due to mildly elevated glucose reading at last visit. Adjust as needed   Chronic low back pain, unspecified back pain laterality, with sciatica presence unspecified       Followed by orthopedics, minimal benefit with injection. Continue muscle relaxers, pt will follow up with specialist about next steps   Relevant Medications   methocarbamol (ROBAXIN) 500 MG tablet   cyclobenzaprine (FLEXERIL) 5 MG tablet   Dizziness       Declines EKG and orthostatics today as the episodes have passed. Will restart correct BP medication and control migraines, f/u if returning sxs       Follow up plan: Return for as scheduled.

## 2018-04-13 NOTE — Assessment & Plan Note (Signed)
Refill losartan, continue checking BPs and monitoring closely to see if sxs resolve. Hydrate well, rest, DASH diet.

## 2018-04-13 NOTE — Assessment & Plan Note (Signed)
Possibly triggered from rebound back/neck pain and muscle soreness but will monitor closely. Maxalt prn for now. Call if worsening

## 2018-04-14 ENCOUNTER — Ambulatory Visit: Payer: BLUE CROSS/BLUE SHIELD | Admitting: Family Medicine

## 2018-04-14 LAB — HEMOGLOBIN A1C
Est. average glucose Bld gHb Est-mCnc: 134 mg/dL
Hgb A1c MFr Bld: 6.3 % — ABNORMAL HIGH (ref 4.8–5.6)

## 2018-04-18 ENCOUNTER — Other Ambulatory Visit: Payer: Self-pay | Admitting: Family Medicine

## 2018-04-18 DIAGNOSIS — E1165 Type 2 diabetes mellitus with hyperglycemia: Secondary | ICD-10-CM | POA: Insufficient documentation

## 2018-04-18 DIAGNOSIS — E119 Type 2 diabetes mellitus without complications: Secondary | ICD-10-CM | POA: Insufficient documentation

## 2018-04-18 DIAGNOSIS — R7303 Prediabetes: Secondary | ICD-10-CM

## 2018-04-18 DIAGNOSIS — E118 Type 2 diabetes mellitus with unspecified complications: Secondary | ICD-10-CM | POA: Insufficient documentation

## 2018-04-18 DIAGNOSIS — R7301 Impaired fasting glucose: Secondary | ICD-10-CM | POA: Insufficient documentation

## 2018-04-18 HISTORY — DX: Type 2 diabetes mellitus with hyperglycemia: E11.65

## 2018-04-18 MED ORDER — METFORMIN HCL 500 MG PO TABS: 500.0000 mg | ORAL_TABLET | Freq: Every day | ORAL | 1 refills | Status: DC

## 2018-05-10 ENCOUNTER — Other Ambulatory Visit: Payer: Self-pay

## 2018-05-10 DIAGNOSIS — K219 Gastro-esophageal reflux disease without esophagitis: Secondary | ICD-10-CM

## 2018-05-10 DIAGNOSIS — R0789 Other chest pain: Secondary | ICD-10-CM

## 2018-05-11 ENCOUNTER — Telehealth: Payer: Self-pay

## 2018-05-11 NOTE — Telephone Encounter (Signed)
LMTCO. Pt has procedure Manometry/PH Impendance study on 05/30/2018. To call [phone] day before for time.

## 2018-05-15 ENCOUNTER — Telehealth: Payer: Self-pay

## 2018-05-15 NOTE — Telephone Encounter (Signed)
Reminder that your manometry/Ph study is on 05/30/2018 and to call the day before to get arrival time at [phone]. Sent via my chart. Check in at the medial mall at the registration desk. Nothing to eat or dnk after midnight prior. On not take PPI's or pain medication th morning

## 2018-05-30 ENCOUNTER — Ambulatory Visit
Admission: RE | Admit: 2018-05-30 | Discharge: 2018-05-30 | Disposition: A | Discharge status: Home / Self Care | Payer: BLUE CROSS/BLUE SHIELD | Source: Ambulatory Visit | Attending: Gastroenterology | Admitting: Gastroenterology

## 2018-05-30 ENCOUNTER — Encounter
Admission: RE | Disposition: A | Discharge status: Home / Self Care | Payer: Self-pay | Source: Ambulatory Visit | Attending: Gastroenterology

## 2018-05-30 DIAGNOSIS — R142 Eructation: Secondary | ICD-10-CM | POA: Diagnosis present

## 2018-05-30 DIAGNOSIS — Z79899 Other long term (current) drug therapy: Secondary | ICD-10-CM | POA: Diagnosis not present

## 2018-05-30 HISTORY — PX: ESOPHAGEAL MANOMETRY: SHX5429

## 2018-05-30 HISTORY — PX: 24 HOUR PH STUDY: SHX5419

## 2018-05-30 SURGERY — MANOMETRY, ESOPHAGUS

## 2018-05-30 MED ORDER — LIDOCAINE HCL URETHRAL/MUCOSAL 2 % EX GEL
CUTANEOUS | Status: AC
  Filled 2018-05-30: qty 5

## 2018-05-30 MED ORDER — BUTAMBEN-TETRACAINE-BENZOCAINE 2-2-14 % EX AERO
INHALATION_SPRAY | CUTANEOUS | Status: AC
  Filled 2018-05-30: qty 5

## 2018-05-30 SURGICAL SUPPLY — 2 items
FACESHIELD LNG OPTICON STERILE (SAFETY) IMPLANT
GLOVE BIO SURGEON STRL SZ8 (GLOVE) ×4 IMPLANT

## 2018-05-31 ENCOUNTER — Encounter: Payer: Self-pay | Admitting: Gastroenterology

## 2018-06-01 ENCOUNTER — Telehealth: Payer: Self-pay | Admitting: Gastroenterology

## 2018-06-01 NOTE — Telephone Encounter (Signed)
PT would like to know if he needs to keep the 11/15 appt? Pls call patien.

## 2018-06-02 NOTE — Telephone Encounter (Signed)
I spoke with pt at length regarding his acid reflux and regurgitation, feeling something is lodged in throat,  which continues to give him a problem. He does not take the Dexilant any more as he was taking this incorrectly 2xd instead of 1xd, and ran out. His PCP prescribed Protonix 2xd of which makes his symptoms managable. As to him manometry on 11/12 there was difficulty in completing the test, so it was only half completed. It was attempted x4. Pt states they are hoping they can get enough results to help with diagnosis. This may need to be repeated when his nose heals. He stated he felt like he was going to pass out at the time. 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 Pt has appt on Monday (made from when he was referred) and he was inquiring whether he should keep this appt or not. After speaking with pt I advised him to keep appt. He was worried about getting his job done so I told him to come at 8:45 am instead of 10:30am so he could get to work. Pt in agreement.

## 2018-06-05 ENCOUNTER — Ambulatory Visit (INDEPENDENT_AMBULATORY_CARE_PROVIDER_SITE_OTHER): Payer: BLUE CROSS/BLUE SHIELD | Admitting: Gastroenterology

## 2018-06-05 ENCOUNTER — Encounter: Payer: Self-pay | Admitting: Gastroenterology

## 2018-06-05 ENCOUNTER — Ambulatory Visit: Payer: BLUE CROSS/BLUE SHIELD | Admitting: Gastroenterology

## 2018-06-05 VITALS — BP 116/82 | HR 94 | Ht 68.0 in | Wt 203.0 lb

## 2018-06-05 DIAGNOSIS — K219 Gastro-esophageal reflux disease without esophagitis: Secondary | ICD-10-CM | POA: Diagnosis not present

## 2018-06-05 DIAGNOSIS — Z23 Encounter for immunization: Secondary | ICD-10-CM

## 2018-06-05 NOTE — Progress Notes (Signed)
Keith Antigua, MD [address]  [place]  [place], [place] [address]  Main: [phone]  Fax: [phone]   Primary Care Physician: Keith Barber, [place]  Primary Gastroenterologist:  Dr. Vonda Barber  Chief Complaint  Patient presents with  . Follow-up    GERD, GLOBUS SENSATION    HPI: Keith Barber is a 47 y.o. male is a follow-up was reflux.  Patient is now on Protonix 40 mg twice daily prescribed to him by ENT.  Patient states the medication is helping, but he is still having daily symptoms of globus sensation and heartburn.  He underwent manometry and pH testing was ordered with it but he states that he could not tolerate the pH probe and so that test was not completed, but the manometry was completed.  I am awaiting the results, which are to be read by Keith Barber this week.  He was previously on Keith Barber but was taking it twice daily instead of once daily and so ran out of the medication sooner than expected and then could not get a refill, and was therefore switched to Protonix.  He states his symptoms occurs even without meals.  He eats 3 hours before bedtime.  Due to his back pain he cannot lay on an incline surface and lays flat.  Drinks only about 2 to 3 ounces of caffeine, once in the morning.  Patient denies any dysphagia, nausea or vomiting.  Due to finding of fatty liver discussed with him on last visit he has been trying to lose weight and states has lost about 10 pounds intentionally with diet and exercise.  EGD July 2019 showed normal esophagus, regular Z line at 39 cm, gastric erythema, normal duodenum.  Pathology negative for H. pylori. Colonoscopy July 2019 done for screening and history of diverticulitis, with one 3 mm rectosigmoid polyp removed, diverticulosis seen, internal hemorrhoids, otherwise normal.  Repeat recommended in 5 years.  Pathology showed hyperplastic polyp.  CT abdomen in April 2019 showed extensive hepatic steatosis.   Follow-up blood work in June 2019, with viral and autoimmune hepatitis work-up was negative.  Alk phos mildly chronically elevated, with most recent being from August 2019, elevated to 145.  Patient denies any pruritus.  GGT mildly elevated as well.  Current Outpatient Medications  Medication Sig Dispense Refill  . cyclobenzaprine (FLEXERIL) 5 MG tablet Take 5 mg by mouth daily.    Marland Kitchen FIBER PO Take 1 capsule by mouth daily.    Marland Kitchen losartan (COZAAR) 50 MG tablet Take 1 tablet (50 mg total) by mouth daily. 90 tablet 1  . metFORMIN (GLUCOPHAGE) 500 MG tablet Take 1 tablet (500 mg total) by mouth daily with breakfast. 90 tablet 1  . methocarbamol (ROBAXIN) 500 MG tablet Take 500 mg by mouth 2 (two) times daily.    . pantoprazole (PROTONIX) 40 MG tablet Take 40 mg by mouth 2 (two) times daily before a meal.    . rizatriptan (MAXALT) 10 MG tablet Take 1 tablet (10 mg total) by mouth as needed for migraine. 10 tablet 3  . rosuvastatin (CRESTOR) 10 MG tablet TAKE 1 TABLET BY MOUTH ONCE DAILY 90 tablet 1  . loperamide (IMODIUM) 2 MG capsule Take 2-4 mg by mouth as needed for diarrhea or loose stools.    . ondansetron (ZOFRAN ODT) 4 MG disintegrating tablet Take 1 tablet (4 mg total) by mouth every 8 (eight) hours as needed for nausea or vomiting. (Patient not taking: Reported on 04/13/2018) 20 tablet 0  No current facility-administered medications for this visit.     Allergies as of 06/05/2018  . (No Known Allergies)    ROS:  General: Negative for anorexia, weight loss, fever, chills, fatigue, weakness. ENT: Negative for hoarseness, difficulty swallowing , nasal congestion. CV: Negative for chest pain, angina, palpitations, dyspnea on exertion, peripheral edema.  Respiratory: Negative for dyspnea at rest, dyspnea on exertion, cough, sputum, wheezing.  GI: See history of present illness. GU:  Negative for dysuria, hematuria, urinary incontinence, urinary frequency, nocturnal urination.  Endo:  Negative for unusual weight change.    Physical Examination:   BP 116/82   Pulse 94   Ht '5\' 8"'  (1.727 m)   Wt 203 lb (92.1 kg)   BMI 30.87 kg/m   General: Well-nourished, well-developed in no acute distress.  Eyes: No icterus. Conjunctivae pink. Mouth: Oropharyngeal mucosa moist and pink , no lesions erythema or exudate. Neck: Supple, Trachea midline Abdomen: Bowel sounds are normal, nontender, nondistended, no hepatosplenomegaly or masses, no abdominal bruits or hernia , no rebound or guarding.   Extremities: No lower extremity edema. No clubbing or deformities. Neuro: Alert and oriented x 3.  Grossly intact. Skin: Warm and dry, no jaundice.   Psych: Alert and cooperative, normal mood and affect.   Labs: CMP     Component Value Date/Time   NA 142 02/23/2018 1554   K 4.4 02/23/2018 1554   CL 101 02/23/2018 1554   CO2 23 02/23/2018 1554   GLUCOSE 110 (H) 02/23/2018 1554   GLUCOSE 176 (H) 12/08/2017 0837   BUN 9 02/23/2018 1554   CREATININE 1.18 02/23/2018 1554   CALCIUM 9.5 02/23/2018 1554   PROT 7.5 02/23/2018 1554   ALBUMIN 4.8 02/23/2018 1554   AST 35 02/23/2018 1554   ALT 61 (H) 02/23/2018 1554   ALKPHOS 145 (H) 02/23/2018 1554   BILITOT 0.6 02/23/2018 1554   GFRNONAA 73 02/23/2018 1554   GFRAA 84 02/23/2018 1554   Lab Results  Component Value Date   WBC 8.4 12/08/2017   HGB 17.6 (H) 12/08/2017   HCT 53.0 (H) 12/08/2017   MCV 88.3 12/08/2017   PLT 201 12/08/2017    Imaging Studies: No results found.  Assessment and Plan:   Keith Barber is a 47 y.o. y/o male here for follow-up of reflux  Manometry completed and results pending pH monitoring could not be completed as patient did not tolerate probe insertion after the manometry Depending on results of manometry test can change management as needed For now since Protonix is helping, and since symptoms are still continuing daily, will continue Protonix at this time (Risks of PPI use were discussed  with patient including bone loss, C. Diff diarrhea, pneumonia, infections, CKD, electrolyte abnormalities.  If clinically possible based on symptoms, goal would be to maintain patient on the lowest dose possible, or discontinue the medication with institution of acid reflux lifestyle modifications over time. Pt. Verbalizes understanding and chooses to continue the medication.)  Can add H2 RA at bedtime after manometry results Patient willing to retry pH monitoring study, and will likely tolerated better since it will be pH monitoring probe alone instead of manometry probe prior to the pH monitoring probe  Acid reflux lifestyle modifications encouraged again  Finding of fatty liver on imaging discussed with patient Diet, weight loss, and exercise encouraged along with avoiding hepatotoxic drugs including alcohol Risk of progression to cirrhosis if above measures are not instituted were discussed as well, and patient verbalized understanding  Hepatitis A  and B vaccination discussed and will be given today Chronically elevated alk phos likely due to underlying fatty liver We will follow on subsequent visits, and since patient has instituted diet and weight loss, it is expected to decrease.  If does not improve, can order further testing if indicated.  Asymptomatic at this time otherwise.  Dr Keith Barber

## 2018-06-13 ENCOUNTER — Other Ambulatory Visit: Payer: Self-pay | Admitting: Family Medicine

## 2018-06-14 ENCOUNTER — Telehealth: Payer: Self-pay | Admitting: Gastroenterology

## 2018-06-14 NOTE — Telephone Encounter (Signed)
Requested Prescriptions  Pending Prescriptions Disp Refills  . rosuvastatin (CRESTOR) 10 MG tablet [Pharmacy Med Name: ROSUVASTATIN 10MG  TAB] 90 tablet 1    Sig: TAKE 1 TABLET BY MOUTH ONCE DAILY     Cardiovascular:  Antilipid - Statins Failed - 06/13/2018  7:55 PM      Failed - HDL in normal range and within 360 days    HDL  Date Value Ref Range Status  02/23/2018 35 (L) >39 mg/dL Final         Failed - Triglycerides in normal range and within 360 days    Triglycerides  Date Value Ref Range Status  02/23/2018 311 (H) 0 - 149 mg/dL Final         Passed - Total Cholesterol in normal range and within 360 days    Cholesterol, Total  Date Value Ref Range Status  02/23/2018 154 100 - 199 mg/dL Final         Passed - LDL in normal range and within 360 days    LDL Calculated  Date Value Ref Range Status  02/23/2018 57 0 - 99 mg/dL Final         Passed - Patient is not pregnant      Passed - Valid encounter within last 12 months    Recent Outpatient Visits          2 months ago Essential hypertension   [facility], [place]   3 months ago Need for diphtheria-tetanus-pertussis (Tdap) vaccine   [facility], [place], [place]   5 months ago History of diverticulitis   [facility] Kathrine Haddock, NP   7 months ago Diverticulitis   [place], [place]   7 months ago LLQ pain   [place], Lilia Argue, [place]      Future Appointments            In 2 months Orene Desanctis, Lilia Argue, [place], [place]   In 2 months Virgel Manifold, MD [place]

## 2018-06-14 NOTE — Telephone Encounter (Signed)
Requested Prescriptions  Pending Prescriptions Disp Refills  . rizatriptan (MAXALT) 10 MG tablet [Pharmacy Med Name: RIZATRIPTAN 10MG  TAB] 10 tablet 3    Sig: TAKE 1 TABLET BY MOUTH AS NEEDED FOR MIGRAINE     Neurology:  Migraine Therapy - Triptan Passed - 06/13/2018  7:55 PM      Passed - Last BP in normal range    BP Readings from Last 1 Encounters:  06/05/18 116/82         Passed - Valid encounter within last 12 months    Recent Outpatient Visits          2 months ago Essential hypertension   [facility], [place]   3 months ago Need for diphtheria-tetanus-pertussis (Tdap) vaccine   [facility], Lilia Argue, [place]   5 months ago History of diverticulitis   [facility] Kathrine Haddock, NP   7 months ago Diverticulitis   [place], [place]   7 months ago LLQ pain   [place], Lilia Argue, [place]      Future Appointments            In 2 months Orene Desanctis, Lilia Argue, [place], [place]   In 2 months Virgel Manifold, MD [place]

## 2018-06-14 NOTE — Telephone Encounter (Signed)
Pt is calling for results from procedure on last week. Please call

## 2018-06-14 NOTE — Telephone Encounter (Signed)
This is not my patient.

## 2018-06-14 NOTE — Telephone Encounter (Signed)
I do not see results. It was ordered under Dr. Vicente Males because he was to read it. It was done on 05/30/18. I did not see it scanned.

## 2018-06-14 NOTE — Telephone Encounter (Signed)
I was not aware that it was done, I Will read it on Monday

## 2018-06-30 ENCOUNTER — Telehealth: Payer: Self-pay | Admitting: Gastroenterology

## 2018-06-30 NOTE — Telephone Encounter (Signed)
Pt is calling regarding results from Procedure he would like a call

## 2018-07-02 NOTE — Telephone Encounter (Signed)
Dr Bonna Gains - there has been an issue with the equipment which has not taken all the measurements. Kieth Brightly is trying to contact the company to find out if there is a fix.   I will interpret it once am told that all the measurements have been taken for the software to process it   [place]

## 2018-07-03 ENCOUNTER — Ambulatory Visit: Payer: BLUE CROSS/BLUE SHIELD | Admitting: Gastroenterology

## 2018-07-07 ENCOUNTER — Ambulatory Visit (INDEPENDENT_AMBULATORY_CARE_PROVIDER_SITE_OTHER): Payer: BLUE CROSS/BLUE SHIELD | Admitting: Gastroenterology

## 2018-07-07 DIAGNOSIS — Z23 Encounter for immunization: Secondary | ICD-10-CM | POA: Diagnosis not present

## 2018-07-07 DIAGNOSIS — K760 Fatty (change of) liver, not elsewhere classified: Secondary | ICD-10-CM

## 2018-07-07 NOTE — Progress Notes (Signed)
Pt receives 2nd TwinRix injection today. Was informed that results from manometry have been delayed due to machine malfunction and that we will call him with results as soon as we know something. Apologized for the delay. Pt states he is having to clear his throat more. Noted especially when he answers the phone.

## 2018-07-07 NOTE — Telephone Encounter (Signed)
Pt in office today for injection and made aware.

## 2018-08-15 ENCOUNTER — Telehealth: Payer: Self-pay | Admitting: Gastroenterology

## 2018-08-15 NOTE — Telephone Encounter (Signed)
LMTCO.

## 2018-08-15 NOTE — Telephone Encounter (Signed)
Pt left vm for Debbie returning her call

## 2018-08-16 NOTE — Telephone Encounter (Signed)
Have tried to reach pt by phone, and he called back, then left another message that results were being sent to My Chart or if needed he also could contact office.

## 2018-08-16 NOTE — Telephone Encounter (Signed)
Sent message via mychart

## 2018-08-29 ENCOUNTER — Encounter: Payer: BLUE CROSS/BLUE SHIELD | Admitting: Family Medicine

## 2018-08-30 ENCOUNTER — Ambulatory Visit (INDEPENDENT_AMBULATORY_CARE_PROVIDER_SITE_OTHER): Payer: BLUE CROSS/BLUE SHIELD | Admitting: Family Medicine

## 2018-08-30 ENCOUNTER — Encounter: Payer: Self-pay | Admitting: Family Medicine

## 2018-08-30 ENCOUNTER — Ambulatory Visit (INDEPENDENT_AMBULATORY_CARE_PROVIDER_SITE_OTHER): Payer: BLUE CROSS/BLUE SHIELD | Admitting: Gastroenterology

## 2018-08-30 ENCOUNTER — Encounter: Payer: Self-pay | Admitting: Gastroenterology

## 2018-08-30 VITALS — BP 138/81 | HR 93 | Temp 98.5°F | Ht 69.29 in | Wt 196.0 lb

## 2018-08-30 VITALS — BP 109/75 | HR 101 | Ht 68.0 in | Wt 196.6 lb

## 2018-08-30 DIAGNOSIS — Z0000 Encounter for general adult medical examination without abnormal findings: Secondary | ICD-10-CM | POA: Diagnosis not present

## 2018-08-30 DIAGNOSIS — R7301 Impaired fasting glucose: Secondary | ICD-10-CM | POA: Diagnosis not present

## 2018-08-30 DIAGNOSIS — R1032 Left lower quadrant pain: Secondary | ICD-10-CM

## 2018-08-30 DIAGNOSIS — K219 Gastro-esophageal reflux disease without esophagitis: Secondary | ICD-10-CM

## 2018-08-30 DIAGNOSIS — R748 Abnormal levels of other serum enzymes: Secondary | ICD-10-CM

## 2018-08-30 DIAGNOSIS — I10 Essential (primary) hypertension: Secondary | ICD-10-CM

## 2018-08-30 DIAGNOSIS — E782 Mixed hyperlipidemia: Secondary | ICD-10-CM

## 2018-08-30 MED ORDER — PANTOPRAZOLE SODIUM 40 MG PO TBEC: 40.0000 mg | DELAYED_RELEASE_TABLET | Freq: Two times a day (BID) | ORAL | 2 refills | Status: DC

## 2018-08-30 NOTE — Progress Notes (Signed)
BP 138/81   Pulse 93   Temp 98.5 F (36.9 C) (Oral)   Ht 5' 9.29" (1.76 m)   Wt 196 lb (88.9 kg)   SpO2 96%   BMI 28.70 kg/m    Subjective:    Patient ID: Keith Barber, male    DOB: September 08, 1970, 48 y.o.   MRN: [id number]  HPI: Keith Barber is a 48 y.o. male presenting on 08/30/2018 for comprehensive medical examination. Current medical complaints include:see below  Several weeks of intermittent LLQ abdominal pain that feels similar to when he had diverticulitis last year. Denies fevers, decreased appetite, significant diarrhea. Sees GI specialist this afternoon and plans to discuss with them.   Started paleo and has lost about 15 lb. Has completely cut out sodas and most sugary or processed foods. Feels very good about the changes he's made.   BPs stable on low dose losartan and lifestyle modifications. Denies side effects, Cp, SOB, dizziness.   Taking crestor for cholesterol and fatty liver management. Making major diet changes. Denies claudication, myalgias.   IFG - stable on metformin. No low blood sugar spells. Does not check home sugars.   He currently lives with: Interim Problems from his last visit: no  Depression Screen done today and results listed below:  Depression screen [facility] 2/9 08/30/2018 09/21/2017 07/15/2017 01/07/2017  Decreased Interest 0 0 0 0  Down, Depressed, Hopeless 0 0 0 0  PHQ - 2 Score 0 0 0 0  Altered sleeping 1 - - -  Tired, decreased energy 1 - - -  Change in appetite 0 - - -  Feeling bad or failure about yourself  0 - - -  Trouble concentrating 0 - - -  Moving slowly or fidgety/restless 0 - - -  Suicidal thoughts 0 - - -  PHQ-9 Score 2 - - -    The patient does not have a history of falls. I did not complete a risk assessment for falls. A plan of care for falls was not documented.   Past Medical History:  Past Medical History:  Diagnosis Date  . Bone spur    LEFT SHOULDER THAT MAKES HIS LEFT ARM TINGLE - had removed  .  Diverticulitis large intestine 09/2017  . GERD (gastroesophageal reflux disease)   . Hemorrhoids   . Hyperlipidemia   . Hypertension   . Migraines    rare migraines    Surgical History:  Past Surgical History:  Procedure Laterality Date  . [place] STUDY N/A 05/30/2018   Procedure: [place] STUDY;  Surgeon: Virgel Manifold, MD;  Location: [facility];  Service: Gastroenterology;  Laterality: N/A;  . COLONOSCOPY WITH PROPOFOL N/A 02/01/2018   Procedure: COLONOSCOPY WITH PROPOFOL;  Surgeon: Virgel Manifold, MD;  Location: [facility];  Service: Endoscopy;  Laterality: N/A;  . ESOPHAGEAL MANOMETRY N/A 05/30/2018   Procedure: ESOPHAGEAL MANOMETRY (EM);  Surgeon: Virgel Manifold, MD;  Location: [facility];  Service: Gastroenterology;  Laterality: N/A;  . ESOPHAGOGASTRODUODENOSCOPY (EGD) WITH PROPOFOL N/A 02/01/2018   Procedure: ESOPHAGOGASTRODUODENOSCOPY (EGD) WITH PROPOFOL;  Surgeon: Virgel Manifold, MD;  Location: [facility];  Service: Endoscopy;  Laterality: N/A;  . INGUINAL HERNIA REPAIR Bilateral 01/21/2017   Procedure: LAPAROSCOPIC BILATERAL INGUINAL HERNIA REPAIR;  Surgeon: Jules Husbands, MD;  Location: [facility];  Service: General;  Laterality: Bilateral;  . KNEE SURGERY Left   . SHOULDER ARTHROSCOPY WITH DISTAL CLAVICLE RESECTION Left 07/05/2017   Procedure: LEFT SHOULDER ARTHROSCOPY, SUBACROMIAL DECOMPRESSION,  DISTAL CLAVICLE RESECTION, POSSIBLE MINI OPEN ROTATOR CUFF REPAIR;  Surgeon: [name], MD;  Location: [place];  Service: Orthopedics;  Laterality: Left;  . SHOULDER CLOSED REDUCTION Left 12/13/2017   Procedure: LEFT SHOULDER MANIPULATION with steroid injection;  Surgeon: [name], MD;  Location: [place];  Service: Orthopedics;  Laterality: Left;  . SHOULDER SURGERY Right   . UMBILICAL HERNIA REPAIR N/A 01/21/2017   Procedure: HERNIA REPAIR UMBILICAL ADULT;  Surgeon: Jules Husbands, MD;  Location: [facility];  Service: General;   Laterality: N/A;    Medications:  Current Outpatient Medications on File Prior to Visit  Medication Sig  . cyclobenzaprine (FLEXERIL) 5 MG tablet Take 5 mg by mouth daily.  Marland Kitchen FIBER PO Take 1 capsule by mouth daily.  Marland Kitchen loperamide (IMODIUM) 2 MG capsule Take 2-4 mg by mouth as needed for diarrhea or loose stools.  Marland Kitchen losartan (COZAAR) 50 MG tablet Take 1 tablet (50 mg total) by mouth daily.  . metFORMIN (GLUCOPHAGE) 500 MG tablet Take 1 tablet (500 mg total) by mouth daily with breakfast.  . methocarbamol (ROBAXIN) 500 MG tablet Take 500 mg by mouth 2 (two) times daily.  . ondansetron (ZOFRAN ODT) 4 MG disintegrating tablet Take 1 tablet (4 mg total) by mouth every 8 (eight) hours as needed for nausea or vomiting.  . rizatriptan (MAXALT) 10 MG tablet TAKE 1 TABLET BY MOUTH AS NEEDED FOR MIGRAINE  . rosuvastatin (CRESTOR) 10 MG tablet TAKE 1 TABLET BY MOUTH ONCE DAILY  . azelastine (ASTELIN) 0.1 % nasal spray USE 1 SPRAY IN EACH NOSTRIL EVERY 12 HOURS AS NEEDED FOR ALLERGIES  . fluticasone (FLONASE) 50 MCG/ACT nasal spray USE 2 SPRAY(S) IN EACH NOSTRIL TWICE DAILY  . SYMBICORT 160-4.5 MCG/ACT inhaler INHALE 2 PUFFS INTO LUNGS TWICE DAILY   No current facility-administered medications on file prior to visit.     Allergies:  No Known Allergies  Social History:  Social History   Socioeconomic History  . Marital status: Divorced    Spouse name: Not on file  . Number of children: Not on file  . Years of education: Not on file  . Highest education level: Not on file  Occupational History  . Not on file  Social Needs  . Financial resource strain: Not on file  . Food insecurity:    Worry: Not on file    Inability: Not on file  . Transportation needs:    Medical: Not on file    Non-medical: Not on file  Tobacco Use  . Smoking status: Former Smoker    Packs/day: 0.50    Years: 26.00    Pack years: 13.00    Types: Cigarettes    Last attempt to quit: 01/13/2017    Years since  quitting: 1.6  . Smokeless tobacco: Never Used  . Tobacco comment: PT STARTED TAKING ZYBAN ON 01-08-17 IN HOPES OF QUITTING  Substance and Sexual Activity  . Alcohol use: No  . Drug use: No  . Sexual activity: Not on file  Lifestyle  . Physical activity:    Days per week: Not on file    Minutes per session: Not on file  . Stress: Not on file  Relationships  . Social connections:    Talks on phone: Not on file    Gets together: Not on file    Attends religious service: Not on file    Active member of club or organization: Not on file    Attends meetings of clubs  or organizations: Not on file    Relationship status: Not on file  . Intimate partner violence:    Fear of current or ex partner: Not on file    Emotionally abused: Not on file    Physically abused: Not on file    Forced sexual activity: Not on file  Other Topics Concern  . Not on file  Social History Narrative  . Not on file   Social History   Tobacco Use  Smoking Status Former Smoker  . Packs/day: 0.50  . Years: 26.00  . Pack years: 13.00  . Types: Cigarettes  . Last attempt to quit: 01/13/2017  . Years since quitting: 1.6  Smokeless Tobacco Never Used  Tobacco Comment   PT STARTED TAKING ZYBAN ON 01-08-17 IN HOPES OF QUITTING   Social History   Substance and Sexual Activity  Alcohol Use No    Family History:  Family History  Problem Relation Age of Onset  . Diabetes Mother   . Migraines Mother   . Cancer Neg Hx   . COPD Neg Hx   . Heart disease Neg Hx   . Stroke Neg Hx     Past medical history, surgical history, medications, allergies, family history and social history reviewed with patient today and changes made to appropriate areas of the chart.   Review of Systems - General ROS: negative Psychological ROS: negative Ophthalmic ROS: negative ENT ROS: negative Allergy and Immunology ROS: negative Hematological and Lymphatic ROS: negative Endocrine ROS: negative Respiratory ROS: no cough,  shortness of breath, or wheezing Cardiovascular ROS: no chest pain or dyspnea on exertion Gastrointestinal ROS: no abdominal pain, change in bowel habits, or black or bloody stools Genito-Urinary ROS: no dysuria, trouble voiding, or hematuria Musculoskeletal ROS: negative Neurological ROS: no TIA or stroke symptoms Dermatological ROS: negative All other ROS negative except what is listed above and in the HPI.      Objective:    BP 138/81   Pulse 93   Temp 98.5 F (36.9 C) (Oral)   Ht 5' 9.29" (1.76 m)   Wt 196 lb (88.9 kg)   SpO2 96%   BMI 28.70 kg/m   Wt Readings from Last 3 Encounters:  08/30/18 196 lb 9.6 oz (89.2 kg)  08/30/18 196 lb (88.9 kg)  06/05/18 203 lb (92.1 kg)    Physical Exam Vitals signs and nursing note reviewed.  Constitutional:      General: He is not in acute distress.    Appearance: He is well-developed.  HENT:     Head: Atraumatic.     Right Ear: External ear normal.     Left Ear: External ear normal.     Nose: Nose normal.  Eyes:     General: No scleral icterus.    Conjunctiva/sclera: Conjunctivae normal.     Pupils: Pupils are equal, round, and reactive to light.  Neck:     Musculoskeletal: Normal range of motion and neck supple.  Cardiovascular:     Rate and Rhythm: Normal rate and regular rhythm.     Heart sounds: Normal heart sounds. No murmur.  Pulmonary:     Effort: Pulmonary effort is normal. No respiratory distress.     Breath sounds: Normal breath sounds.  Abdominal:     General: Bowel sounds are normal. There is no distension.     Palpations: Abdomen is soft. There is no mass.     Tenderness: There is no abdominal tenderness. There is no guarding.  Genitourinary:  Comments: Exam declined Musculoskeletal: Normal range of motion.        General: No tenderness.  Skin:    General: Skin is warm and dry.     Findings: No rash.  Neurological:     General: No focal deficit present.     Mental Status: He is alert and oriented to  person, place, and time.     Deep Tendon Reflexes: Reflexes are normal and symmetric.  Psychiatric:        Mood and Affect: Mood normal.        Behavior: Behavior normal.        Thought Content: Thought content normal.        Judgment: Judgment normal.     Results for orders placed or performed in visit on 04/13/18  HgB A1c  Result Value Ref Range   Hgb A1c MFr Bld 6.3 (H) 4.8 - 5.6 %   Est. average glucose Bld gHb Est-mCnc 134 mg/dL      Assessment & Plan:   Problem List Items Addressed This Visit      Cardiovascular and Mediastinum   Essential hypertension - Primary    Stable and WNL, continue current regimen        Endocrine   Impaired fasting glucose    Recheck A1C, continue excellent lifestyle modifications and metformin        Other   Hyperlipidemia    Stable, recheck lipids and continue good lifestyle modifications. Adjust as needed       Other Visit Diagnoses    Annual physical exam       LLQ pain       Exam and vitals benign appearing, will defer to GI assessment this afternoon at f/u appt       Discussed aspirin prophylaxis for myocardial infarction prevention   LABORATORY TESTING:  Health maintenance labs ordered today as discussed above.   The natural history of prostate cancer and ongoing controversy regarding screening and potential treatment outcomes of prostate cancer has been discussed with the patient. The meaning of a false positive PSA and a false negative PSA has been discussed. He indicates understanding of the limitations of this screening test and wishes not to proceed with screening PSA testing.   IMMUNIZATIONS:   - Tdap: Tetanus vaccination status reviewed: last tetanus booster within 10 years. - Influenza: Up to date  PATIENT COUNSELING:    Sexuality: Discussed sexually transmitted diseases, partner selection, use of condoms, avoidance of unintended pregnancy  and contraceptive alternatives.   Advised to avoid cigarette  smoking.  I discussed with the patient that most people either abstain from alcohol or drink within safe limits (<=14/week and <=4 drinks/occasion for males, <=7/weeks and <= 3 drinks/occasion for females) and that the risk for alcohol disorders and other health effects rises proportionally with the number of drinks per week and how often a drinker exceeds daily limits.  Discussed cessation/primary prevention of drug use and availability of treatment for abuse.   Diet: Encouraged to adjust caloric intake to maintain  or achieve ideal body weight, to reduce intake of dietary saturated fat and total fat, to limit sodium intake by avoiding high sodium foods and not adding table salt, and to maintain adequate dietary potassium and calcium preferably from fresh fruits, vegetables, and low-fat dairy products.    stressed the importance of regular exercise  Injury prevention: Discussed safety belts, safety helmets, smoke detector, smoking near bedding or upholstery.   Dental health: Discussed importance of regular tooth  brushing, flossing, and dental visits.   Follow up plan: NEXT PREVENTATIVE PHYSICAL DUE IN 1 YEAR. Return in about 6 months (around 02/28/2019) for 6 month f/u.

## 2018-08-30 NOTE — Patient Instructions (Signed)
Bed wedge Increase protonix to twice daily. Discontinue Dexilant F/u 3 months   Gastroesophageal Reflux Disease, Adult Gastroesophageal reflux (GER) happens when acid from the stomach flows up into the tube that connects the mouth and the stomach (esophagus). Normally, food travels down the esophagus and stays in the stomach to be digested. With GER, food and stomach acid sometimes move back up into the esophagus. You may have a disease called gastroesophageal reflux disease (GERD) if the reflux:  Happens often.  Causes frequent or very bad symptoms.  Causes problems such as damage to the esophagus. When this happens, the esophagus becomes sore and swollen (inflamed). Over time, GERD can make small holes (ulcers) in the lining of the esophagus. What are the causes? This condition is caused by a problem with the muscle between the esophagus and the stomach. When this muscle is weak or not normal, it does not close properly to keep food and acid from coming back up from the stomach. The muscle can be weak because of:  Tobacco use.  Pregnancy.  Having a certain type of hernia (hiatal hernia).  Alcohol use.  Certain foods and drinks, such as coffee, chocolate, onions, and peppermint. What increases the risk? You are more likely to develop this condition if you:  Are overweight.  Have a disease that affects your connective tissue.  Use NSAID medicines. What are the signs or symptoms? Symptoms of this condition include:  Heartburn.  Difficult or painful swallowing.  The feeling of having a lump in the throat.  A bitter taste in the mouth.  Bad breath.  Having a lot of saliva.  Having an upset or bloated stomach.  Belching.  Chest pain. Different conditions can cause chest pain. Make sure you see your doctor if you have chest pain.  Shortness of breath or noisy breathing (wheezing).  Ongoing (chronic) cough or a cough at night.  Wearing away of the surface of  teeth (tooth enamel).  Weight loss. How is this treated? Treatment will depend on how bad your symptoms are. Your doctor may suggest:  Changes to your diet.  Medicine.  Surgery. Follow these instructions at home: Eating and drinking   Follow a diet as told by your doctor. You may need to avoid foods and drinks such as: ? Coffee and tea (with or without caffeine). ? Drinks that contain alcohol. ? Energy drinks and sports drinks. ? Bubbly (carbonated) drinks or sodas. ? Chocolate and cocoa. ? Peppermint and mint flavorings. ? Garlic and onions. ? Horseradish. ? Spicy and acidic foods. These include peppers, chili powder, curry powder, vinegar, hot sauces, and BBQ sauce. ? Citrus fruit juices and citrus fruits, such as oranges, lemons, and limes. ? Tomato-based foods. These include red sauce, chili, salsa, and pizza with red sauce. ? Fried and fatty foods. These include donuts, french fries, potato chips, and high-fat dressings. ? High-fat meats. These include hot dogs, rib eye steak, sausage, ham, and bacon. ? High-fat dairy items, such as whole milk, butter, and cream cheese.  Eat small meals often. Avoid eating large meals.  Avoid drinking large amounts of liquid with your meals.  Avoid eating meals during the 2-3 hours before bedtime.  Avoid lying down right after you eat.  Do not exercise right after you eat. Lifestyle   Do not use any products that contain nicotine or tobacco. These include cigarettes, e-cigarettes, and chewing tobacco. If you need help quitting, ask your doctor.  Try to lower your stress. If you need  help doing this, ask your doctor.  If you are overweight, lose an amount of weight that is healthy for you. Ask your doctor about a safe weight loss goal. General instructions  Pay attention to any changes in your symptoms.  Take over-the-counter and prescription medicines only as told by your doctor. Do not take aspirin, ibuprofen, or other NSAIDs  unless your doctor says it is okay.  Wear loose clothes. Do not wear anything tight around your waist.  Raise (elevate) the head of your bed about 6 inches (15 cm).  Avoid bending over if this makes your symptoms worse.  Keep all follow-up visits as told by your doctor. This is important. Contact a doctor if:  You have new symptoms.  You lose weight and you do not know why.  You have trouble swallowing or it hurts to swallow.  You have wheezing or a cough that keeps happening.  Your symptoms do not get better with treatment.  You have a hoarse voice. Get help right away if:  You have pain in your arms, neck, jaw, teeth, or back.  You feel sweaty, dizzy, or light-headed.  You have chest pain or shortness of breath.  You throw up (vomit) and your throw-up looks like blood or coffee grounds.  You pass out (faint).  Your poop (stool) is bloody or black.  You cannot swallow, drink, or eat. Summary  If a person has gastroesophageal reflux disease (GERD), food and stomach acid move back up into the esophagus and cause symptoms or problems such as damage to the esophagus.  Treatment will depend on how bad your symptoms are.  Follow a diet as told by your doctor.  Take all medicines only as told by your doctor. This information is not intended to replace advice given to you by your health care provider. Make sure you discuss any questions you have with your health care provider. Document Released: 12/22/2007 Document Revised: 01/11/2018 Document Reviewed: 01/11/2018 Elsevier Interactive Patient Education  2019 [employer].

## 2018-08-30 NOTE — Progress Notes (Signed)
[name], MD [address]  [place]  [place], [place] [address]  Main: [phone]  Fax: [phone]   Primary Care Physician: Volney American, PA-C   Chief Complaint  Patient presents with  . Follow-up    Reflux  . Diverticulosis Discussion    LLQ Pain Occasional Mild to [place]    HPI: Viraat Vanpatten is a 48 y.o. male here for follow-up of reflux.  Patient is taking Protonix once daily in the morning and states this has helped his symptoms but he continues to have symptoms every day.  His main complaint is having to clear his throat multiple times a day and has seen ENT for this and they initially prescribed him Dexilant but this was switched to Protonix.  He denies any dysphagia.  Manometry and pH testing was ordered but patient could not tolerate the pH catheter only manometry was done which showed a normal motility study.  Previous history: EGD July 2019 showed normal esophagus, regular Z line at 39 cm, gastric erythema, normal duodenum.  Pathology negative for H. pylori. Colonoscopy July 2019 done for screening and history of diverticulitis, with one 3 mm rectosigmoid polyp removed, diverticulosis seen, internal hemorrhoids, otherwise normal.  Repeat recommended in 5 years.  Pathology showed hyperplastic polyp.  CT abdomen in April 2019 showed extensive hepatic steatosis.  Follow-up blood work in June 2019, with viral and autoimmune hepatitis work-up was negative.  Alk phos mildly chronically elevated, with most recent being from August 2019, elevated to 145.  Patient denies any pruritus.  GGT mildly elevated as well.  Current Outpatient Medications  Medication Sig Dispense Refill  . cyclobenzaprine (FLEXERIL) 5 MG tablet Take 5 mg by mouth daily.    Marland Kitchen FIBER PO Take 1 capsule by mouth daily.    Marland Kitchen lisinopril (PRINIVIL,ZESTRIL) 10 MG tablet Take 10 mg by mouth daily.    Marland Kitchen loperamide (IMODIUM) 2 MG capsule Take 2-4 mg by mouth as needed for  diarrhea or loose stools.    Marland Kitchen losartan (COZAAR) 50 MG tablet Take 1 tablet (50 mg total) by mouth daily. 90 tablet 1  . metFORMIN (GLUCOPHAGE) 500 MG tablet Take 1 tablet (500 mg total) by mouth daily with breakfast. 90 tablet 1  . methocarbamol (ROBAXIN) 500 MG tablet Take 500 mg by mouth 2 (two) times daily.    . ondansetron (ZOFRAN ODT) 4 MG disintegrating tablet Take 1 tablet (4 mg total) by mouth every 8 (eight) hours as needed for nausea or vomiting. 20 tablet 0  . rizatriptan (MAXALT) 10 MG tablet TAKE 1 TABLET BY MOUTH AS NEEDED FOR MIGRAINE 10 tablet 3  . rosuvastatin (CRESTOR) 10 MG tablet TAKE 1 TABLET BY MOUTH ONCE DAILY 90 tablet 1  . azelastine (ASTELIN) 0.1 % nasal spray USE 1 SPRAY IN EACH NOSTRIL EVERY 12 HOURS AS NEEDED FOR ALLERGIES    . fluticasone (FLONASE) 50 MCG/ACT nasal spray USE 2 SPRAY(S) IN EACH NOSTRIL TWICE DAILY    . pantoprazole (PROTONIX) 40 MG tablet Take 1 tablet (40 mg total) by mouth 2 (two) times daily before a meal. 60 tablet 2  . SYMBICORT 160-4.5 MCG/ACT inhaler INHALE 2 PUFFS INTO LUNGS TWICE DAILY     No current facility-administered medications for this visit.     Allergies as of 08/30/2018  . (No Known Allergies)    ROS:  General: Negative for anorexia, weight loss, fever, chills, fatigue, weakness. ENT: Negative for hoarseness, difficulty swallowing , nasal congestion. CV: Negative  for chest pain, angina, palpitations, dyspnea on exertion, peripheral edema.  Respiratory: Negative for dyspnea at rest, dyspnea on exertion, cough, sputum, wheezing.  GI: See history of present illness. GU:  Negative for dysuria, hematuria, urinary incontinence, urinary frequency, nocturnal urination.  Endo: Negative for unusual weight change.    Physical Examination:   BP 109/75   Pulse (!) 101   Ht _0  (1.727 m)   Wt 196 lb 9.6 oz (89.2 kg)   BMI 29.89 kg/m   General: Well-nourished, well-developed in no acute distress.  Eyes: No icterus.  Conjunctivae pink. Mouth: Oropharyngeal mucosa moist and pink , no lesions erythema or exudate. Neck: Supple, Trachea midline Abdomen: Bowel sounds are normal, nontender, nondistended, no hepatosplenomegaly or masses, no abdominal bruits or hernia , no rebound or guarding.   Extremities: No lower extremity edema. No clubbing or deformities. Neuro: Alert and oriented x 3.  Grossly intact. Skin: Warm and dry, no jaundice.   Psych: Alert and cooperative, normal mood and affect.   Labs: CMP     Component Value Date/Time   NA 142 02/23/2018 1554   K 4.4 02/23/2018 1554   CL 101 02/23/2018 1554   CO2 23 02/23/2018 1554   GLUCOSE 110 (H) 02/23/2018 1554   GLUCOSE 176 (H) 12/08/2017 0837   BUN 9 02/23/2018 1554   CREATININE 1.18 02/23/2018 1554   CALCIUM 9.5 02/23/2018 1554   PROT 7.5 02/23/2018 1554   ALBUMIN 4.8 02/23/2018 1554   AST 35 02/23/2018 1554   ALT 61 (H) 02/23/2018 1554   ALKPHOS 145 (H) 02/23/2018 1554   BILITOT 0.6 02/23/2018 1554   GFRNONAA 73 02/23/2018 1554   GFRAA 84 02/23/2018 1554   Lab Results  Component Value Date   WBC 8.4 12/08/2017   HGB 17.6 (H) 12/08/2017   HCT 53.0 (H) 12/08/2017   MCV 88.3 12/08/2017   PLT 201 12/08/2017    Imaging Studies: No results found.  Assessment and Plan:   Cristofer Yaffe is a 48 y.o. y/o male here for follow-up of reflux  Due to ongoing symptoms daily, we discussed several options. We discussed reattempting pH testing, but patient does not want to proceed with this as he states he could not tolerate the catheter insertion last time and does not want to undergo it again at this time.  He is already sleeping on a reclined surface and does not eat 3 hours before bedtime and still has daily reflux symptoms. We therefore discussed increasing Protonix to twice daily, and also discussed the PPI and risks associated with this.  (Risks of PPI use were discussed with patient including bone loss, C. Diff diarrhea,  pneumonia, infections, CKD, electrolyte abnormalities.  Pt. Verbalizes understanding and chooses to continue the medication.)  He also has a history of fatty liver and has been eating healthier and trying to lose weight with diet and exercise His alk phos was mildly elevated previously and we will repeat this for further evaluation His viral and autoimmune hepatitis work-up has been negative He has been commenced on hepatitis a and B vaccination   Dr [name]

## 2018-08-31 LAB — COMPREHENSIVE METABOLIC PANEL
ALT: 37 IU/L (ref 0–44)
AST: 20 IU/L (ref 0–40)
Albumin/Globulin Ratio: 1.8 (ref 1.2–2.2)
Albumin: 4.8 g/dL (ref 4.0–5.0)
Alkaline Phosphatase: 123 IU/L — ABNORMAL HIGH (ref 39–117)
BUN/Creatinine Ratio: 12 (ref 9–20)
BUN: 12 mg/dL (ref 6–24)
Bilirubin Total: 0.7 mg/dL (ref 0.0–1.2)
CALCIUM: 9.6 mg/dL (ref 8.7–10.2)
CO2: 22 mmol/L (ref 20–29)
Chloride: 102 mmol/L (ref 96–106)
Creatinine, Ser: 0.99 mg/dL (ref 0.76–1.27)
GFR calc Af Amer: 104 mL/min/{1.73_m2} (ref >59)
GFR, EST NON AFRICAN AMERICAN: 90 mL/min/{1.73_m2} (ref >59)
Globulin, Total: 2.7 g/dL (ref 1.5–4.5)
Glucose: 87 mg/dL (ref 65–99)
Potassium: 4.8 mmol/L (ref 3.5–5.2)
Sodium: 140 mmol/L (ref 134–144)
Total Protein: 7.5 g/dL (ref 6.0–8.5)

## 2018-09-04 NOTE — Assessment & Plan Note (Signed)
Stable, recheck lipids and continue good lifestyle modifications. Adjust as needed

## 2018-09-04 NOTE — Assessment & Plan Note (Signed)
Stable and WNL, continue current regimen 

## 2018-09-04 NOTE — Assessment & Plan Note (Signed)
Recheck A1C, continue excellent lifestyle modifications and metformin

## 2018-10-01 ENCOUNTER — Encounter: Payer: Self-pay | Admitting: Family Medicine

## 2018-10-02 MED ORDER — LOSARTAN POTASSIUM 50 MG PO TABS: 50.0000 mg | ORAL_TABLET | Freq: Every day | ORAL | 1 refills | Status: DC

## 2018-10-04 LAB — HM DIABETES EYE EXAM

## 2018-10-22 ENCOUNTER — Encounter: Payer: Self-pay | Admitting: Family Medicine

## 2018-10-23 ENCOUNTER — Other Ambulatory Visit: Payer: Self-pay | Admitting: Family Medicine

## 2018-10-23 MED ORDER — METFORMIN HCL 500 MG PO TABS: 500.0000 mg | ORAL_TABLET | Freq: Every day | ORAL | 1 refills | Status: DC

## 2018-11-22 ENCOUNTER — Ambulatory Visit (INDEPENDENT_AMBULATORY_CARE_PROVIDER_SITE_OTHER): Payer: Self-pay | Admitting: Gastroenterology

## 2018-11-22 ENCOUNTER — Encounter: Payer: Self-pay | Admitting: Gastroenterology

## 2018-11-22 DIAGNOSIS — Z5329 Procedure and treatment not carried out because of patient's decision for other reasons: Secondary | ICD-10-CM

## 2018-11-28 ENCOUNTER — Telehealth: Payer: Self-pay

## 2018-11-28 ENCOUNTER — Ambulatory Visit (INDEPENDENT_AMBULATORY_CARE_PROVIDER_SITE_OTHER): Payer: BLUE CROSS/BLUE SHIELD | Admitting: Gastroenterology

## 2018-11-28 DIAGNOSIS — K219 Gastro-esophageal reflux disease without esophagitis: Secondary | ICD-10-CM

## 2018-11-28 DIAGNOSIS — K760 Fatty (change of) liver, not elsewhere classified: Secondary | ICD-10-CM

## 2018-11-28 MED ORDER — FAMOTIDINE 20 MG PO TABS: 20.0000 mg | ORAL_TABLET | Freq: Every day | ORAL | 0 refills | Status: DC

## 2018-11-28 NOTE — Addendum Note (Signed)
Addended by: Earl Lagos on: 11/28/2018 11:08 AM   Modules accepted: Orders

## 2018-11-28 NOTE — Addendum Note (Signed)
Addended by: Earl Lagos on: 11/28/2018 12:50 PM   Modules accepted: Orders

## 2018-11-28 NOTE — Progress Notes (Signed)
Keith Antigua, MD [address]  [place]  [place], [place] [address]  Main: [phone]  Fax: [phone]   Primary Care Physician: Volney American, PA-C  Virtual Visit via Video Note  I connected with patient on 11/28/18 at 10:00 AM EDT by video (using [email or web address]) and verified that I am speaking with the correct person using two identifiers.   I discussed the limitations, risks, security and privacy concerns of performing an evaluation and management service by video and the availability of in person appointments. I also discussed with the patient that there may be a patient responsible charge related to this service. The patient expressed understanding and agreed to proceed.  Location of Patient: Home Location of Provider: Home Persons involved: Patient and provider only (Nursing staff checked in patient via phone but were not physically involved in the video interaction - see their notes)   History of Present Illness: Chief Complaint  Patient presents with   Follow Up Elevated Liver Enzymes    HPI: Keith Barber is a 48 y.o. male here for follow-up of reflux.  Patient has been taking Protonix once daily and has had made significant diet changes which is helped his symptoms.  He states he used to feel a scratchy sensation in his throat at all times, and this has completely resolved.  Does not feel any heartburn.  However, continues to complain of a chronic cough.  No dysphagia.  No weight loss.  No abdominal pain.  No nausea or vomiting.  Previously, Protonix was increased to twice daily due to his ongoing symptoms and he states due to cost he kept it at 1 daily.  Manometry and pH testing was ordered but patient could not tolerate the pH catheter only manometry was done which showed a normal motility study.  Previous history: EGD July 2019 showed normal esophagus, regular Z line at 39 cm, gastric erythema, normal duodenum. Pathology negative for H.  pylori. Colonoscopy July 2019 done for screening and history of diverticulitis, with one 3 mm rectosigmoid polyp removed, diverticulosis seen, internal hemorrhoids, otherwise normal. Repeat recommended in 5 years. Pathology showed hyperplastic polyp.  CT abdomen in April 2019 showed extensive hepatic steatosis. Follow-up blood work in June 2019, with viral and autoimmune hepatitis work-up was negative.Alk phos mildly chronically elevated, with most recent being from February 2020 around 120. Patient denies any pruritus. GGT mildly elevated as well.  Patient has also made diet changes and is working on weight loss.  Current Outpatient Medications  Medication Sig Dispense Refill   azelastine (ASTELIN) 0.1 % nasal spray USE 1 SPRAY IN EACH NOSTRIL EVERY 12 HOURS AS NEEDED FOR ALLERGIES     cyclobenzaprine (FLEXERIL) 5 MG tablet Take 5 mg by mouth daily.     diclofenac (VOLTAREN) 75 MG EC tablet Take 75 mg by mouth 2 (two) times daily.     famotidine (PEPCID) 20 MG tablet Take 1 tablet (20 mg total) by mouth at bedtime for 30 days. 30 tablet 0   FIBER PO Take 1 capsule by mouth daily.     fluticasone (FLONASE) 50 MCG/ACT nasal spray USE 2 SPRAY(S) IN EACH NOSTRIL TWICE DAILY     ibuprofen (ADVIL) 100 MG/5ML suspension Take by mouth.     loperamide (IMODIUM) 2 MG capsule Take 2-4 mg by mouth as needed for diarrhea or loose stools.     loratadine (CLARITIN) 10 MG tablet Take by mouth.     losartan (COZAAR) 50 MG tablet Take 1  tablet (50 mg total) by mouth daily. 90 tablet 1   metFORMIN (GLUCOPHAGE) 500 MG tablet Take 1 tablet (500 mg total) by mouth daily with breakfast. 90 tablet 1   methocarbamol (ROBAXIN) 500 MG tablet Take 500 mg by mouth 2 (two) times daily.     NAPROXEN SODIUM PO Take by mouth as needed.     ondansetron (ZOFRAN ODT) 4 MG disintegrating tablet Take 1 tablet (4 mg total) by mouth every 8 (eight) hours as needed for nausea or vomiting. (Patient not taking:  Reported on 11/22/2018) 20 tablet 0   pantoprazole (PROTONIX) 40 MG tablet Take 1 tablet (40 mg total) by mouth 2 (two) times daily before a meal. 60 tablet 2   rizatriptan (MAXALT) 10 MG tablet TAKE 1 TABLET BY MOUTH AS NEEDED FOR MIGRAINE 10 tablet 3   rosuvastatin (CRESTOR) 10 MG tablet TAKE 1 TABLET BY MOUTH ONCE DAILY 90 tablet 1   SYMBICORT 160-4.5 MCG/ACT inhaler INHALE 2 PUFFS INTO LUNGS TWICE DAILY     No current facility-administered medications for this visit.     Allergies as of 11/28/2018   (No Known Allergies)    Review of Systems:    All systems reviewed and negative except where noted in HPI.   Observations/Objective:  Labs: CMP     Component Value Date/Time   NA 140 08/30/2018 1441   K 4.8 08/30/2018 1441   CL 102 08/30/2018 1441   CO2 22 08/30/2018 1441   GLUCOSE 87 08/30/2018 1441   GLUCOSE 176 (H) 12/08/2017 0837   BUN 12 08/30/2018 1441   CREATININE 0.99 08/30/2018 1441   CALCIUM 9.6 08/30/2018 1441   PROT 7.5 08/30/2018 1441   ALBUMIN 4.8 08/30/2018 1441   AST 20 08/30/2018 1441   ALT 37 08/30/2018 1441   ALKPHOS 123 (H) 08/30/2018 1441   BILITOT 0.7 08/30/2018 1441   GFRNONAA 90 08/30/2018 1441   GFRAA 104 08/30/2018 1441   Lab Results  Component Value Date   WBC 8.4 12/08/2017   HGB 17.6 (H) 12/08/2017   HCT 53.0 (H) 12/08/2017   MCV 88.3 12/08/2017   PLT 201 12/08/2017    Imaging Studies: No results found.  Assessment and Plan:   Keith Barber is a 48 y.o. y/o male here for follow-up of reflux  Assessment and Plan: Globus sensation and reflux symptoms now well controlled with Protonix once daily.  Patient continues to refuse pH testing (Risks of PPI use were discussed with patient including bone loss, C. Diff diarrhea, pneumonia, infections, CKD, electrolyte abnormalities.  Pt. Verbalizes understanding and chooses to continue the medication.)  He is only taking Protonix once daily as insurance is not covering twice  daily.  Therefore we will try Pepcid at bedtime to see if his cough improves any.  however, I have discussed with him that his chronic cough, is unlikely to be related to reflux alone.  Given that EGD did not show any esophagitis, and his heartburn and globus of the sensation have completely resolved with once daily PPI and diet changes.  therefore, I have asked him to contact his primary care provider due to his chronic cough to see if he needs a chest x-ray or any other work-up given his previous smoking history.  He has not smoked in 2 years but states was smoking for 24 years prior to that.  He verbalized understanding.  This note has also been copied to his PCP.  Patient educated extensively on acid reflux lifestyle modification, including  buying a bed wedge, not eating 3 hrs before bedtime, diet modifications, and handout given for the same.   Repeat CMP and ultrasound in 3 months, which will be 6 months from his last CMP.  Finding of fatty liver on imaging discussed with patient Diet, weight loss, and exercise encouraged along with avoiding hepatotoxic drugs including alcohol Risk of progression to cirrhosis if above measures are not instituted were discussed as well, and patient verbalized understanding    Follow Up Instructions: Follow-up in 6 months   I discussed the assessment and treatment plan with the patient. The patient was provided an opportunity to ask questions and all were answered. The patient agreed with the plan and demonstrated an understanding of the instructions.   The patient was advised to call back or seek an in-person evaluation if the symptoms worsen or if the condition fails to improve as anticipated.  I provided 20 minutes of face-to-face time via video software during this encounter.   Virgel Manifold, MD  Speech recognition software was used to dictate this note.

## 2018-11-28 NOTE — Telephone Encounter (Signed)
Pt was notified that a 6 month f/u appt needed that reminder letter will be sent, also CMP lab ordered but due to previous problem with a bill from [employer] that is still is process of being resolved, pt will go to [facility]  Lab. His Hep A/B injection (last one) is to be scheduled around 01/06/19 and we will do this but is apt to change due to schedule opening in conjunction with the Covid-19 virus. Also RUQ ultrasound ordered for fatty liver in 3 months at the outpatient imaging center on [address]. On 03/02/2019 at 8:45 am, arrival time 8:30 am. Nothing to eat or drink after midnight prior to ultrasound. Pt voiced understanding and is okay with communication with My Chart. He will view his appt for the Hep A/B vaccine there.

## 2018-11-29 ENCOUNTER — Ambulatory Visit: Payer: BLUE CROSS/BLUE SHIELD | Admitting: Gastroenterology

## 2018-12-05 ENCOUNTER — Encounter: Payer: Self-pay | Admitting: Family Medicine

## 2018-12-05 ENCOUNTER — Other Ambulatory Visit: Payer: Self-pay | Admitting: Family Medicine

## 2018-12-05 MED ORDER — ROSUVASTATIN CALCIUM 10 MG PO TABS: 10.0000 mg | ORAL_TABLET | Freq: Every day | ORAL | 1 refills | Status: DC

## 2019-01-05 ENCOUNTER — Ambulatory Visit (INDEPENDENT_AMBULATORY_CARE_PROVIDER_SITE_OTHER): Payer: BLUE CROSS/BLUE SHIELD | Admitting: Gastroenterology

## 2019-01-05 ENCOUNTER — Other Ambulatory Visit: Payer: Self-pay | Admitting: Gastroenterology

## 2019-01-05 ENCOUNTER — Other Ambulatory Visit
Admission: RE | Admit: 2019-01-05 | Discharge: 2019-01-05 | Disposition: A | Discharge status: Home / Self Care | Payer: BLUE CROSS/BLUE SHIELD | Attending: Gastroenterology | Admitting: Gastroenterology

## 2019-01-05 ENCOUNTER — Other Ambulatory Visit: Payer: Self-pay

## 2019-01-05 DIAGNOSIS — Z23 Encounter for immunization: Secondary | ICD-10-CM | POA: Diagnosis not present

## 2019-01-05 DIAGNOSIS — R945 Abnormal results of liver function studies: Secondary | ICD-10-CM | POA: Diagnosis not present

## 2019-01-05 DIAGNOSIS — R7989 Other specified abnormal findings of blood chemistry: Secondary | ICD-10-CM

## 2019-01-06 LAB — HEPATITIS C ANTIBODY: HCV Ab: <0.1 s/co ratio (ref 0.0–0.9)

## 2019-01-08 NOTE — Progress Notes (Signed)
No show

## 2019-02-18 ENCOUNTER — Encounter: Payer: Self-pay | Admitting: Family Medicine

## 2019-02-20 ENCOUNTER — Other Ambulatory Visit: Payer: Self-pay | Admitting: Family Medicine

## 2019-02-20 MED ORDER — RIZATRIPTAN BENZOATE 10 MG PO TABS: ORAL_TABLET | ORAL | 3 refills | Status: DC

## 2019-02-28 ENCOUNTER — Other Ambulatory Visit: Payer: Self-pay

## 2019-02-28 ENCOUNTER — Other Ambulatory Visit
Admission: RE | Admit: 2019-02-28 | Discharge: 2019-02-28 | Disposition: A | Discharge status: Home / Self Care | Payer: BLUE CROSS/BLUE SHIELD | Source: Ambulatory Visit | Attending: Family Medicine | Admitting: Family Medicine

## 2019-02-28 ENCOUNTER — Ambulatory Visit (INDEPENDENT_AMBULATORY_CARE_PROVIDER_SITE_OTHER): Payer: BLUE CROSS/BLUE SHIELD | Admitting: Family Medicine

## 2019-02-28 ENCOUNTER — Encounter: Payer: Self-pay | Admitting: Family Medicine

## 2019-02-28 VITALS — BP 116/81 | HR 85 | Temp 98.2°F | Ht 68.0 in | Wt 210.0 lb

## 2019-02-28 DIAGNOSIS — E782 Mixed hyperlipidemia: Secondary | ICD-10-CM | POA: Insufficient documentation

## 2019-02-28 DIAGNOSIS — R1032 Left lower quadrant pain: Secondary | ICD-10-CM

## 2019-02-28 DIAGNOSIS — G43109 Migraine with aura, not intractable, without status migrainosus: Secondary | ICD-10-CM | POA: Diagnosis not present

## 2019-02-28 DIAGNOSIS — I10 Essential (primary) hypertension: Secondary | ICD-10-CM | POA: Diagnosis not present

## 2019-02-28 DIAGNOSIS — R7301 Impaired fasting glucose: Secondary | ICD-10-CM

## 2019-02-28 LAB — HEMOGLOBIN A1C
Hgb A1c MFr Bld: 5.5 % (ref 4.8–5.6)
Mean Plasma Glucose: 111.15 mg/dL

## 2019-02-28 LAB — COMPREHENSIVE METABOLIC PANEL
ALT: 59 U/L — ABNORMAL HIGH (ref 0–44)
AST: 41 U/L (ref 15–41)
Albumin: 4.1 g/dL (ref 3.5–5.0)
Alkaline Phosphatase: 96 U/L (ref 38–126)
Anion gap: 9 (ref 5–15)
BUN: 13 mg/dL (ref 6–20)
CO2: 22 mmol/L (ref 22–32)
Calcium: 8.9 mg/dL (ref 8.9–10.3)
Chloride: 106 mmol/L (ref 98–111)
Creatinine, Ser: 0.88 mg/dL (ref 0.61–1.24)
GFR calc Af Amer: >60 mL/min (ref >60)
GFR calc non Af Amer: >60 mL/min (ref >60)
Glucose, Bld: 129 mg/dL — ABNORMAL HIGH (ref 70–99)
Potassium: 4 mmol/L (ref 3.5–5.1)
Sodium: 137 mmol/L (ref 135–145)
Total Bilirubin: 1.3 mg/dL — ABNORMAL HIGH (ref 0.3–1.2)
Total Protein: 7.2 g/dL (ref 6.5–8.1)

## 2019-02-28 LAB — LIPID PANEL
Cholesterol: 131 mg/dL (ref 0–200)
HDL: 34 mg/dL — ABNORMAL LOW (ref >40)
LDL Cholesterol: 60 mg/dL (ref 0–99)
Total CHOL/HDL Ratio: 3.9 RATIO
Triglycerides: 184 mg/dL — ABNORMAL HIGH (ref <150)
VLDL: 37 mg/dL (ref 0–40)

## 2019-02-28 MED ORDER — DULOXETINE HCL 30 MG PO CPEP: 30.0000 mg | ORAL_CAPSULE | Freq: Every day | ORAL | 0 refills | Status: DC

## 2019-02-28 NOTE — Progress Notes (Signed)
BP 116/81 (BP Location: Right Arm, Patient Position: Sitting, Cuff Size: Normal)   Pulse 85   Temp 98.2 F (36.8 C) (Oral)   Ht 5\' 8"  (1.727 m)   Wt 210 lb (95.3 kg)   SpO2 97%   BMI 31.93 kg/m    Subjective:    Patient ID: Keith Barber, male    DOB: [birth year], 48 y.o.   MRN: [id number]  HPI: Keith Barber is a 48 y.o. male  Chief Complaint  Patient presents with  . Hypertension    6 month F/U.   Marland Kitchen Hyperlipidemia  . IFG   HTN - sometimes checks BPs at home, usually 120s/80s. Taking medicines faithfully without side effects. Denies CP, SOB, HAs, dizziness. Trying to eat well and stay active.   Has been getting more migraines lately. Averaging about 2-3 per week. Sees floaters in his eyes and will go ahead and take an aleve or maxalt which typically takes care of things before it gets worse.   HLD - tolerating crestor well. No myalgias, claudication, CP, SOB.   IFG - Taking metformin and watching diet closely. No low blood sugar spells or side effects noted.   Still having some intermittent pain in the LLQ like he had before. Also having some groin muscle pains. Still having some chronic low back and shoulder pains from past injuries. Taking aleve typically twice daily for all of this. Following with GI for his abdominal pain still.   Relevant past medical, surgical, family and social history reviewed and updated as indicated. Interim medical history since our last visit reviewed. Allergies and medications reviewed and updated.  Review of Systems  Per HPI unless specifically indicated above     Objective:    BP 116/81 (BP Location: Right Arm, Patient Position: Sitting, Cuff Size: Normal)   Pulse 85   Temp 98.2 F (36.8 C) (Oral)   Ht 5\' 8"  (1.727 m)   Wt 210 lb (95.3 kg)   SpO2 97%   BMI 31.93 kg/m   Wt Readings from Last 3 Encounters:  02/28/19 210 lb (95.3 kg)  08/30/18 196 lb 9.6 oz (89.2 kg)  08/30/18 196 lb (88.9 kg)    Physical Exam  Vitals signs and nursing note reviewed.  Constitutional:      Appearance: Normal appearance.  HENT:     Head: Atraumatic.  Eyes:     Extraocular Movements: Extraocular movements intact.     Conjunctiva/sclera: Conjunctivae normal.  Neck:     Musculoskeletal: Normal range of motion and neck supple.  Cardiovascular:     Rate and Rhythm: Normal rate and regular rhythm.  Pulmonary:     Effort: Pulmonary effort is normal.     Breath sounds: Normal breath sounds.  Musculoskeletal: Normal range of motion.  Skin:    General: Skin is warm and dry.  Neurological:     General: No focal deficit present.     Mental Status: He is oriented to person, place, and time.  Psychiatric:        Mood and Affect: Mood normal.        Thought Content: Thought content normal.        Judgment: Judgment normal.     Results for orders placed or performed during the hospital encounter of 01/05/19  Hepatitis C Antibody  Result Value Ref Range   HCV Ab <0.1 0.0 - 0.9 s/co ratio      Assessment & Plan:   Problem List Items Addressed This Visit  Cardiovascular and Mediastinum   Essential hypertension - Primary    BPs stable and WNL, continue current regimen      Relevant Orders   Comprehensive metabolic panel   Migraines    Will add daily cymbalta given increased frequency and severity of migraines. Continue prn maxalt for breakthrough.       Relevant Medications   DULoxetine (CYMBALTA) 30 MG capsule     Endocrine   Impaired fasting glucose    Recheck A1C, adjust as needed. Continue current regimen      Relevant Orders   HgB A1c     Other   Hyperlipidemia    Recheck lipids, continue current regimen and adjust as needed based on results. Continue good lifestyle modifications      Relevant Orders   Lipid Panel w/o Chol/HDL Ratio    Other Visit Diagnoses    LLQ pain       Follow up with GI for this as they are managing his intermittent pain       Follow up plan: Return in  about 4 weeks (around 03/28/2019) for Migraine f/u.

## 2019-03-02 ENCOUNTER — Other Ambulatory Visit: Payer: Self-pay

## 2019-03-02 ENCOUNTER — Ambulatory Visit
Admission: RE | Admit: 2019-03-02 | Discharge: 2019-03-02 | Disposition: A | Discharge status: Home / Self Care | Payer: BLUE CROSS/BLUE SHIELD | Source: Ambulatory Visit | Attending: Gastroenterology | Admitting: Gastroenterology

## 2019-03-02 DIAGNOSIS — K760 Fatty (change of) liver, not elsewhere classified: Secondary | ICD-10-CM | POA: Diagnosis not present

## 2019-03-05 NOTE — Assessment & Plan Note (Signed)
BPs stable and WNL, continue current regimen 

## 2019-03-05 NOTE — Assessment & Plan Note (Signed)
Recheck lipids, continue current regimen and adjust as needed based on results. Continue good lifestyle modifications

## 2019-03-05 NOTE — Assessment & Plan Note (Signed)
Will add daily cymbalta given increased frequency and severity of migraines. Continue prn maxalt for breakthrough.

## 2019-03-05 NOTE — Assessment & Plan Note (Signed)
Recheck A1C, adjust as needed. Continue current regimen. 

## 2019-03-18 ENCOUNTER — Encounter: Payer: Self-pay | Admitting: Family Medicine

## 2019-03-19 ENCOUNTER — Other Ambulatory Visit: Payer: Self-pay | Admitting: Family Medicine

## 2019-03-19 MED ORDER — PANTOPRAZOLE SODIUM 40 MG PO TBEC: 40.0000 mg | DELAYED_RELEASE_TABLET | Freq: Two times a day (BID) | ORAL | 2 refills | Status: DC

## 2019-03-29 ENCOUNTER — Other Ambulatory Visit: Payer: Self-pay

## 2019-03-29 ENCOUNTER — Ambulatory Visit (INDEPENDENT_AMBULATORY_CARE_PROVIDER_SITE_OTHER): Payer: BLUE CROSS/BLUE SHIELD | Admitting: Family Medicine

## 2019-03-29 ENCOUNTER — Encounter: Payer: Self-pay | Admitting: Family Medicine

## 2019-03-29 VITALS — BP 131/88 | HR 85 | Temp 98.8°F | Ht 68.0 in | Wt 214.0 lb

## 2019-03-29 DIAGNOSIS — Z23 Encounter for immunization: Secondary | ICD-10-CM | POA: Diagnosis not present

## 2019-03-29 DIAGNOSIS — M79602 Pain in left arm: Secondary | ICD-10-CM

## 2019-03-29 DIAGNOSIS — G43109 Migraine with aura, not intractable, without status migrainosus: Secondary | ICD-10-CM | POA: Diagnosis not present

## 2019-03-29 DIAGNOSIS — M79601 Pain in right arm: Secondary | ICD-10-CM | POA: Diagnosis not present

## 2019-03-29 MED ORDER — GABAPENTIN 300 MG PO CAPS: 300.0000 mg | ORAL_CAPSULE | Freq: Three times a day (TID) | ORAL | 1 refills | Status: DC | PRN

## 2019-03-29 NOTE — Progress Notes (Signed)
BP 131/88   Pulse 85   Temp 98.8 F (37.1 C) (Oral)   Ht 5\' 8"  (1.727 m)   Wt 214 lb (97.1 kg)   SpO2 97%   BMI 32.54 kg/m    Subjective:    Patient ID: Keith Barber, male    DOB: December 20, 1970, 48 y.o.   MRN: [id number]  HPI: Keith Barber is a 48 y.o. male  Chief Complaint  Patient presents with  . Migraine    pt states he has not been taking cymbalta daily due to side effects   Patient presenting for migraine f/u after starting cymbalta. Did see a benefit to his pains in his arms and such but seemed to make his headaches worse and caused diarrhea. He stopped it about 2 weeks ago due to this. He states currently the arm pains are more bothersome than the headaches even. 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 Denies redness, swelling to the area.   Relevant past medical, surgical, family and social history reviewed and updated as indicated. Interim medical history since our last visit reviewed. Allergies and medications reviewed and updated.  Review of Systems  Per HPI unless specifically indicated above     Objective:    BP 131/88   Pulse 85   Temp 98.8 F (37.1 C) (Oral)   Ht 5\' 8"  (1.727 m)   Wt 214 lb (97.1 kg)   SpO2 97%   BMI 32.54 kg/m   Wt Readings from Last 3 Encounters:  03/29/19 214 lb (97.1 kg)  02/28/19 210 lb (95.3 kg)  08/30/18 196 lb 9.6 oz (89.2 kg)    Physical Exam Vitals signs and nursing note reviewed.  Constitutional:      Appearance: Normal appearance.  HENT:     Head: Atraumatic.  Eyes:     Extraocular Movements: Extraocular movements intact.     Conjunctiva/sclera: Conjunctivae normal.  Neck:     Musculoskeletal: Normal range of motion and neck supple.  Cardiovascular:     Rate and Rhythm: Normal rate and regular rhythm.  Pulmonary:     Effort: Pulmonary effort is normal.     Breath sounds: Normal breath sounds.  Musculoskeletal: Normal range  of motion.        General: Tenderness (ttp from forearm to above elbow b/l UEs) present.  Skin:    General: Skin is warm and dry.  Neurological:     General: No focal deficit present.     Mental Status: He is oriented to person, place, and time.  Psychiatric:        Mood and Affect: Mood normal.        Thought Content: Thought content normal.        Judgment: Judgment normal.     Results for orders placed or performed during the hospital encounter of 02/28/19  Hemoglobin A1c  Result Value Ref Range   Hgb A1c MFr Bld 5.5 4.8 - 5.6 %   Mean Plasma Glucose 111.15 mg/dL  Lipid panel  Result Value Ref Range   Cholesterol 131 0 - 200 mg/dL   Triglycerides 184 (H) <150 mg/dL   HDL 34 (L) >40 mg/dL   Total CHOL/HDL Ratio 3.9 RATIO   VLDL 37 0 - 40 mg/dL   LDL Cholesterol 60 0 - 99 mg/dL  Comprehensive metabolic panel  Result Value Ref Range   Sodium 137 135 - 145 mmol/L   Potassium 4.0 3.5 -  5.1 mmol/L   Chloride 106 98 - 111 mmol/L   CO2 22 22 - 32 mmol/L   Glucose, Bld 129 (H) 70 - 99 mg/dL   BUN 13 6 - 20 mg/dL   Creatinine, Ser 0.88 0.61 - 1.24 mg/dL   Calcium 8.9 8.9 - 10.3 mg/dL   Total Protein 7.2 6.5 - 8.1 g/dL   Albumin 4.1 3.5 - 5.0 g/dL   AST 41 15 - 41 U/L   ALT 59 (H) 0 - 44 U/L   Alkaline Phosphatase 96 38 - 126 U/L   Total Bilirubin 1.3 (H) 0.3 - 1.2 mg/dL   GFR calc non Af Amer >60 >60 mL/min   GFR calc Af Amer >60 >60 mL/min   Anion gap 9 5 - 15      Assessment & Plan:   Problem List Items Addressed This Visit      Cardiovascular and Mediastinum   Migraines - Primary    Can try the maxalt prn, did not tolerate cymbalta and not wanting to try another preventative at this time. Starting gabapentin for his arm pain, this may possibly help some with his migraines. Will continue to monitor.       Relevant Medications   gabapentin (NEURONTIN) 300 MG capsule    Other Visit Diagnoses    Bilateral arm pain       Suspect radicular from tendonitis issues  and past shoulder issues. Trial gabapentin since cymbalta did help some. Continue good home care.    Flu vaccine need       Relevant Orders   Flu Vaccine QUAD 36+ mos IM (Completed)       Follow up plan: Return in about 5 months (around 08/29/2019) for CPE.

## 2019-04-03 NOTE — Assessment & Plan Note (Signed)
Can try the maxalt prn, did not tolerate cymbalta and not wanting to try another preventative at this time. Starting gabapentin for his arm pain, this may possibly help some with his migraines. Will continue to monitor.

## 2019-04-16 ENCOUNTER — Other Ambulatory Visit: Payer: Self-pay | Admitting: Family Medicine

## 2019-04-16 MED ORDER — LOSARTAN POTASSIUM 50 MG PO TABS: 50.0000 mg | ORAL_TABLET | Freq: Every day | ORAL | 1 refills | Status: DC

## 2019-04-16 MED ORDER — METFORMIN HCL 500 MG PO TABS: 500.0000 mg | ORAL_TABLET | Freq: Every day | ORAL | 1 refills | Status: DC

## 2019-05-31 ENCOUNTER — Other Ambulatory Visit: Payer: Self-pay | Admitting: Family Medicine

## 2019-05-31 MED ORDER — ROSUVASTATIN CALCIUM 10 MG PO TABS: 10.0000 mg | ORAL_TABLET | Freq: Every day | ORAL | 1 refills | Status: DC

## 2019-06-06 ENCOUNTER — Ambulatory Visit: Payer: BLUE CROSS/BLUE SHIELD | Admitting: Gastroenterology

## 2019-08-02 ENCOUNTER — Ambulatory Visit (INDEPENDENT_AMBULATORY_CARE_PROVIDER_SITE_OTHER): Payer: 59 | Admitting: Gastroenterology

## 2019-08-02 ENCOUNTER — Encounter: Payer: Self-pay | Admitting: Gastroenterology

## 2019-08-02 ENCOUNTER — Ambulatory Visit: Payer: BLUE CROSS/BLUE SHIELD | Admitting: Gastroenterology

## 2019-08-02 DIAGNOSIS — K219 Gastro-esophageal reflux disease without esophagitis: Secondary | ICD-10-CM

## 2019-08-02 DIAGNOSIS — R748 Abnormal levels of other serum enzymes: Secondary | ICD-10-CM

## 2019-08-02 DIAGNOSIS — K760 Fatty (change of) liver, not elsewhere classified: Secondary | ICD-10-CM | POA: Diagnosis not present

## 2019-08-02 DIAGNOSIS — R7989 Other specified abnormal findings of blood chemistry: Secondary | ICD-10-CM

## 2019-08-02 MED ORDER — PANTOPRAZOLE SODIUM 20 MG PO TBEC: 20.0000 mg | DELAYED_RELEASE_TABLET | Freq: Every day | ORAL | 1 refills | Status: DC

## 2019-08-02 NOTE — Progress Notes (Signed)
Keith Antigua, MD [address]  [place]  [place], [place] [address]  Main: [phone]  Fax: [phone]   Primary Care Physician: Keith American, PA-C  Virtual Visit via Video Note  I connected with patient on 08/02/19 at 10:15 AM EST by video (using [email or web address]) and verified that I am speaking with the correct person using two identifiers.   I discussed the limitations, risks, security and privacy concerns of performing an evaluation and management service by video and the availability of in person appointments. I also discussed with the patient that there may be a patient responsible charge related to this service. The patient expressed understanding and agreed to proceed.  Location of Patient: Home Location of Provider: Home Persons involved: Patient and provider only (Nursing staff checked in patient via phone but were not physically involved in the video interaction - see their notes)   History of Present Illness: Chief Complaint  Patient presents with  . Follow-up    Patient states he taking Naproxen every day for arm pain. Denies any abdominal pain     HPI: Keith Barber is a 49 y.o. male here for follow-up of reflux and fatty liver.  Is taking Protonix 40 mg once daily and reports symptoms are much better with it.  Was previously having multiple episodes a day of reflux, and now may have it once a day, and mild symptoms only.  Pepcid at bedtime was also prescribed due to ongoing symptoms but patient never took this as pharmacy was out of it and patient did not pick it up over-the-counter.  Patient is also made significant lifestyle changes to diet and timing of eating, which has made a significant difference in both weight loss and reflux.  Previous history: Manometry and pH testing was ordered but patient could not tolerate the pH catheter only manometry was done which showed a normal motility study.  EGD July 2019 showed normal esophagus,  regular Z line at 39 cm, gastric erythema, normal duodenum. Pathology negative for H. pylori. Colonoscopy July 2019 done for screening and history of diverticulitis, with one 3 mm rectosigmoid polyp removed, diverticulosis seen, internal hemorrhoids, otherwise normal. Repeat recommended in 5 years. Pathology showed hyperplastic polyp.  CT abdomen in April 2019 showed extensive hepatic steatosis. Follow-up blood work in June 2019, with viral and autoimmune hepatitis work-up was negative.Alk phos mildly chronically elevated, with most recent being from February 2020 around 120. Patient denies any pruritus. GGT mildly elevated as well.  Current Outpatient Medications  Medication Sig Dispense Refill  . loperamide (IMODIUM) 2 MG capsule Take 2-4 mg by mouth as needed for diarrhea or loose stools.    Marland Kitchen losartan (COZAAR) 50 MG tablet Take 1 tablet (50 mg total) by mouth daily. 90 tablet 1  . metFORMIN (GLUCOPHAGE) 500 MG tablet Take 1 tablet (500 mg total) by mouth daily with breakfast. 90 tablet 1  . NAPROXEN SODIUM PO Take by mouth as needed.    . rosuvastatin (CRESTOR) 10 MG tablet Take 1 tablet (10 mg total) by mouth daily. 90 tablet 1  . pantoprazole (PROTONIX) 20 MG tablet Take 1 tablet (20 mg total) by mouth daily. 90 tablet 1   No current facility-administered medications for this visit.    Allergies as of 08/02/2019  . (No Known Allergies)    Review of Systems:    All systems reviewed and negative except where noted in HPI.   Observations/Objective:  Labs: CMP     Component Value  Date/Time   NA 137 02/28/2019 0941   NA 140 08/30/2018 1441   K 4.0 02/28/2019 0941   CL 106 02/28/2019 0941   CO2 22 02/28/2019 0941   GLUCOSE 129 (H) 02/28/2019 0941   BUN 13 02/28/2019 0941   BUN 12 08/30/2018 1441   CREATININE 0.88 02/28/2019 0941   CALCIUM 8.9 02/28/2019 0941   PROT 7.2 02/28/2019 0941   PROT 7.5 08/30/2018 1441   ALBUMIN 4.1 02/28/2019 0941   ALBUMIN 4.8 08/30/2018  1441   AST 41 02/28/2019 0941   ALT 59 (H) 02/28/2019 0941   ALKPHOS 96 02/28/2019 0941   BILITOT 1.3 (H) 02/28/2019 0941   BILITOT 0.7 08/30/2018 1441   GFRNONAA >60 02/28/2019 0941   GFRAA >60 02/28/2019 0941   Lab Results  Component Value Date   WBC 8.4 12/08/2017   HGB 17.6 (H) 12/08/2017   HCT 53.0 (H) 12/08/2017   MCV 88.3 12/08/2017   PLT 201 12/08/2017    Imaging Studies: No results found.  Assessment and Plan:   Keith Barber is a 49 y.o. y/o male here for follow-up of reflux and fatty liver  Assessment and Plan: Decrease Protonix 20 mg once daily as patient has had improvement in symptoms with weight loss and diet  Due to significant symptoms without PPI, when he was previously being seen requiring manometry and pH testing, will not discontinue it completely at this time as symptoms may return.  He is still having symptoms about once a day, which is improved from 2-3 times a day.  I suspect if we discontinue the PPI completely symptoms will return severely, therefore lowest dose possible would likely be the best course of action for him.  (Risks of PPI use were discussed with patient including bone loss, C. Diff diarrhea, pneumonia, infections, CKD, electrolyte abnormalities.  If clinically possible based on symptoms, goal would be to maintain patient on the lowest dose possible, or discontinue the medication with institution of acid reflux lifestyle modifications over time. Pt. Verbalizes understanding and chooses to continue the medication.)  Chronically elevated liver enzymes mildly.  We will repeat liver enzymes at this time. Patient continues to work on diet, weight loss and exercise due to fatty liver as well.  This was encouraged.  Follow Up Instructions: 3-6 months   I discussed the assessment and treatment plan with the patient. The patient was provided an opportunity to ask questions and all were answered. The patient agreed with the plan and  demonstrated an understanding of the instructions.   The patient was advised to call back or seek an in-person evaluation if the symptoms worsen or if the condition fails to improve as anticipated.  I provided 15 minutes of face-to-face time via video software during this encounter. Additional time was spent in reviewing patient's chart, placing orders etc.   Virgel Manifold, MD  Speech recognition software was used to dictate this note.

## 2019-08-08 LAB — HEPATIC FUNCTION PANEL
ALT: 105 IU/L — ABNORMAL HIGH (ref 0–44)
AST: 58 IU/L — ABNORMAL HIGH (ref 0–40)
Albumin: 4.4 g/dL (ref 4.0–5.0)
Alkaline Phosphatase: 168 IU/L — ABNORMAL HIGH (ref 39–117)
Bilirubin Total: 0.8 mg/dL (ref 0.0–1.2)
Bilirubin, Direct: 0.23 mg/dL (ref 0.00–0.40)
Total Protein: 7.1 g/dL (ref 6.0–8.5)

## 2019-08-12 ENCOUNTER — Encounter: Payer: Self-pay | Admitting: Gastroenterology

## 2019-08-13 ENCOUNTER — Telehealth: Payer: Self-pay

## 2019-08-13 DIAGNOSIS — R7989 Other specified abnormal findings of blood chemistry: Secondary | ICD-10-CM

## 2019-08-13 NOTE — Telephone Encounter (Signed)
Caryl Pina inform LFT's have gone up - enquire if drinking any alcohol - if yes thens top and repeat in 3-4 weeks , if no check any new meds or tylenol use .   Either way repeat LFT's , GGT in 6 weeks and if still abnormal may need fuirther imaging +/- liver bx- set up appointment with Dr Bonna Gains when she is back   [place]   Patient verbalized understanding of lab results. Patient has not been drinking any alcohol or taking any new medication or tylenol. Patient is only taking Aleve as needed. Put in labs for 6 weeks

## 2019-09-05 ENCOUNTER — Other Ambulatory Visit
Admission: RE | Admit: 2019-09-05 | Discharge: 2019-09-05 | Disposition: A | Discharge status: Home / Self Care | Payer: 59 | Source: Ambulatory Visit | Attending: Family Medicine | Admitting: Family Medicine

## 2019-09-05 ENCOUNTER — Encounter: Payer: Self-pay | Admitting: Family Medicine

## 2019-09-05 ENCOUNTER — Ambulatory Visit (INDEPENDENT_AMBULATORY_CARE_PROVIDER_SITE_OTHER): Payer: 59 | Admitting: Family Medicine

## 2019-09-05 ENCOUNTER — Other Ambulatory Visit: Payer: Self-pay

## 2019-09-05 VITALS — BP 129/78 | HR 81 | Temp 98.0°F | Ht 68.0 in | Wt 211.0 lb

## 2019-09-05 DIAGNOSIS — E782 Mixed hyperlipidemia: Secondary | ICD-10-CM

## 2019-09-05 DIAGNOSIS — I10 Essential (primary) hypertension: Secondary | ICD-10-CM

## 2019-09-05 DIAGNOSIS — R7301 Impaired fasting glucose: Secondary | ICD-10-CM | POA: Diagnosis not present

## 2019-09-05 DIAGNOSIS — G43109 Migraine with aura, not intractable, without status migrainosus: Secondary | ICD-10-CM | POA: Diagnosis not present

## 2019-09-05 DIAGNOSIS — M25512 Pain in left shoulder: Secondary | ICD-10-CM

## 2019-09-05 DIAGNOSIS — Z0000 Encounter for general adult medical examination without abnormal findings: Secondary | ICD-10-CM

## 2019-09-05 DIAGNOSIS — K219 Gastro-esophageal reflux disease without esophagitis: Secondary | ICD-10-CM | POA: Diagnosis not present

## 2019-09-05 DIAGNOSIS — G8929 Other chronic pain: Secondary | ICD-10-CM

## 2019-09-05 LAB — COMPREHENSIVE METABOLIC PANEL
ALT: 96 U/L — ABNORMAL HIGH (ref 0–44)
AST: 59 U/L — ABNORMAL HIGH (ref 15–41)
Albumin: 4.5 g/dL (ref 3.5–5.0)
Alkaline Phosphatase: 128 U/L — ABNORMAL HIGH (ref 38–126)
Anion gap: 10 (ref 5–15)
BUN: 12 mg/dL (ref 6–20)
CO2: 26 mmol/L (ref 22–32)
Calcium: 9 mg/dL (ref 8.9–10.3)
Chloride: 102 mmol/L (ref 98–111)
Creatinine, Ser: 0.95 mg/dL (ref 0.61–1.24)
GFR calc Af Amer: >60 mL/min (ref >60)
GFR calc non Af Amer: >60 mL/min (ref >60)
Glucose, Bld: 152 mg/dL — ABNORMAL HIGH (ref 70–99)
Potassium: 4.3 mmol/L (ref 3.5–5.1)
Sodium: 138 mmol/L (ref 135–145)
Total Bilirubin: 1.4 mg/dL — ABNORMAL HIGH (ref 0.3–1.2)
Total Protein: 8.2 g/dL — ABNORMAL HIGH (ref 6.5–8.1)

## 2019-09-05 LAB — CBC WITH DIFFERENTIAL/PLATELET
Abs Immature Granulocytes: 0.05 10*3/uL (ref 0.00–0.07)
Basophils Absolute: 0.1 10*3/uL (ref 0.0–0.1)
Basophils Relative: 1 %
Eosinophils Absolute: 0.1 10*3/uL (ref 0.0–0.5)
Eosinophils Relative: 1 %
HCT: 52.5 % — ABNORMAL HIGH (ref 39.0–52.0)
Hemoglobin: 17.7 g/dL — ABNORMAL HIGH (ref 13.0–17.0)
Immature Granulocytes: 1 %
Lymphocytes Relative: 29 %
Lymphs Abs: 2.6 10*3/uL (ref 0.7–4.0)
MCH: 30.2 pg (ref 26.0–34.0)
MCHC: 33.7 g/dL (ref 30.0–36.0)
MCV: 89.4 fL (ref 80.0–100.0)
Monocytes Absolute: 0.6 10*3/uL (ref 0.1–1.0)
Monocytes Relative: 7 %
Neutro Abs: 5.5 10*3/uL (ref 1.7–7.7)
Neutrophils Relative %: 61 %
Platelets: 187 10*3/uL (ref 150–400)
RBC: 5.87 MIL/uL — ABNORMAL HIGH (ref 4.22–5.81)
RDW: 12.2 % (ref 11.5–15.5)
WBC: 8.9 10*3/uL (ref 4.0–10.5)
nRBC: 0 % (ref 0.0–0.2)

## 2019-09-05 LAB — URINALYSIS, COMPLETE (UACMP) WITH MICROSCOPIC
Bacteria, UA: NONE SEEN
Bilirubin Urine: NEGATIVE
Glucose, UA: NEGATIVE mg/dL
Hgb urine dipstick: NEGATIVE
Ketones, ur: NEGATIVE mg/dL
Leukocytes,Ua: NEGATIVE
Nitrite: NEGATIVE
Protein, ur: NEGATIVE mg/dL
Specific Gravity, Urine: 1.024 (ref 1.005–1.030)
Squamous Epithelial / HPF: NONE SEEN (ref 0–5)
pH: 5 (ref 5.0–8.0)

## 2019-09-05 LAB — LIPID PANEL
Cholesterol: 135 mg/dL (ref 0–200)
HDL: 32 mg/dL — ABNORMAL LOW (ref >40)
LDL Cholesterol: 61 mg/dL (ref 0–99)
Total CHOL/HDL Ratio: 4.2 RATIO
Triglycerides: 212 mg/dL — ABNORMAL HIGH (ref <150)
VLDL: 42 mg/dL — ABNORMAL HIGH (ref 0–40)

## 2019-09-05 LAB — HEMOGLOBIN A1C
Hgb A1c MFr Bld: 6.7 % — ABNORMAL HIGH (ref 4.8–5.6)
Mean Plasma Glucose: 145.59 mg/dL

## 2019-09-05 MED ORDER — ROSUVASTATIN CALCIUM 10 MG PO TABS: 10.0000 mg | ORAL_TABLET | Freq: Every day | ORAL | 1 refills | Status: DC

## 2019-09-05 MED ORDER — RIZATRIPTAN BENZOATE 10 MG PO TABS: 10.0000 mg | ORAL_TABLET | ORAL | 5 refills | Status: DC | PRN

## 2019-09-05 MED ORDER — LOSARTAN POTASSIUM 50 MG PO TABS: 50.0000 mg | ORAL_TABLET | Freq: Every day | ORAL | 1 refills | Status: DC

## 2019-09-05 MED ORDER — METFORMIN HCL 500 MG PO TABS: 500.0000 mg | ORAL_TABLET | Freq: Every day | ORAL | 1 refills | Status: DC

## 2019-09-05 NOTE — Progress Notes (Signed)
BP 129/78   Pulse 81   Temp 98 F (36.7 C) (Oral)   Ht 5\' 8"  (1.727 m)   Wt 211 lb (95.7 kg)   SpO2 96%   BMI 32.08 kg/m    Subjective:    Patient ID: Keith Barber, male    DOB: 1970-11-15, 49 y.o.   MRN: [id number]  HPI: Keith Barber is a 49 y.o. male presenting on 09/05/2019 for comprehensive medical examination. Current medical complaints include:see below  Still dealing with left shoulder pain s/p arthroscopy in 2019. Has dealt with 2 specialists now with no real relief. Feels strength is about 50%, mobility is still limited.   HTN - No longer checks home BPs, taking medicines faithfully without side effects. Denies CP, SOB, HAs, dizziness.   IFG - Trying to watch diet, does not exercise. Denies low blood sugar spells. Does not check home BSs.   HLD - Taking crestor, tolerating well. Denies claudication, myalgias.   GERD - on protonix regimen, tolerating well. Denies abdominal pain, melena.     He currently lives with: Interim Problems from his last visit: no  Depression Screen done today and results listed below:  Depression screen [facility] 2/9 09/05/2019 08/30/2018 09/21/2017 07/15/2017 01/07/2017  Decreased Interest 0 0 0 0 0  Down, Depressed, Hopeless 0 0 0 0 0  PHQ - 2 Score 0 0 0 0 0  Altered sleeping 1 1 - - -  Tired, decreased energy 1 1 - - -  Change in appetite 0 0 - - -  Feeling bad or failure about yourself  0 0 - - -  Trouble concentrating 0 0 - - -  Moving slowly or fidgety/restless 0 0 - - -  Suicidal thoughts 0 0 - - -  PHQ-9 Score 2 2 - - -    The patient does not have a history of falls. I did complete a risk assessment for falls. A plan of care for falls was documented.   Past Medical History:  Past Medical History:  Diagnosis Date  . Bone spur    LEFT SHOULDER THAT MAKES HIS LEFT ARM TINGLE - had removed  . Diverticulitis large intestine 09/2017  . GERD (gastroesophageal reflux disease)   . Hemorrhoids   . Hyperlipidemia   .  Hypertension   . Migraines    rare migraines    Surgical History:  Past Surgical History:  Procedure Laterality Date  . [place] STUDY N/A 05/30/2018   Procedure: [place] STUDY;  Surgeon: Virgel Manifold, MD;  Location: [facility];  Service: Gastroenterology;  Laterality: N/A;  . COLONOSCOPY WITH PROPOFOL N/A 02/01/2018   Procedure: COLONOSCOPY WITH PROPOFOL;  Surgeon: Virgel Manifold, MD;  Location: [facility];  Service: Endoscopy;  Laterality: N/A;  . ESOPHAGEAL MANOMETRY N/A 05/30/2018   Procedure: ESOPHAGEAL MANOMETRY (EM);  Surgeon: Virgel Manifold, MD;  Location: [facility];  Service: Gastroenterology;  Laterality: N/A;  . ESOPHAGOGASTRODUODENOSCOPY (EGD) WITH PROPOFOL N/A 02/01/2018   Procedure: ESOPHAGOGASTRODUODENOSCOPY (EGD) WITH PROPOFOL;  Surgeon: Virgel Manifold, MD;  Location: [facility];  Service: Endoscopy;  Laterality: N/A;  . INGUINAL HERNIA REPAIR Bilateral 01/21/2017   Procedure: LAPAROSCOPIC BILATERAL INGUINAL HERNIA REPAIR;  Surgeon: Jules Husbands, MD;  Location: [facility];  Service: General;  Laterality: Bilateral;  . KNEE SURGERY Left   . SHOULDER ARTHROSCOPY WITH DISTAL CLAVICLE RESECTION Left 07/05/2017   Procedure: LEFT SHOULDER ARTHROSCOPY, SUBACROMIAL DECOMPRESSION, DISTAL CLAVICLE RESECTION, POSSIBLE MINI OPEN ROTATOR CUFF  REPAIR;  Surgeon: [name], MD;  Location: [facility];  Service: Orthopedics;  Laterality: Left;  . SHOULDER CLOSED REDUCTION Left 12/13/2017   Procedure: LEFT SHOULDER MANIPULATION with steroid injection;  Surgeon: [name], MD;  Location: [place];  Service: Orthopedics;  Laterality: Left;  . SHOULDER SURGERY Right   . UMBILICAL HERNIA REPAIR N/A 01/21/2017   Procedure: HERNIA REPAIR UMBILICAL ADULT;  Surgeon: Jules Husbands, MD;  Location: [facility];  Service: General;  Laterality: N/A;    Medications:  Current Outpatient Medications on File Prior to Visit  Medication Sig  . loperamide  (IMODIUM) 2 MG capsule Take 2-4 mg by mouth as needed for diarrhea or loose stools.  Marland Kitchen NAPROXEN SODIUM PO Take by mouth as needed.  . pantoprazole (PROTONIX) 20 MG tablet Take 20 mg by mouth daily.   No current facility-administered medications on file prior to visit.    Allergies:  No Known Allergies  Social History:  Social History   Socioeconomic History  . Marital status: Divorced    Spouse name: Not on file  . Number of children: Not on file  . Years of education: Not on file  . Highest education level: Not on file  Occupational History  . Not on file  Tobacco Use  . Smoking status: Former Smoker    Packs/day: 0.50    Years: 26.00    Pack years: 13.00    Types: Cigarettes    Quit date: 01/13/2017    Years since quitting: 2.6  . Smokeless tobacco: Never Used  . Tobacco comment: PT STARTED TAKING ZYBAN ON 01-08-17 IN HOPES OF QUITTING  Substance and Sexual Activity  . Alcohol use: No  . Drug use: No  . Sexual activity: Not on file  Other Topics Concern  . Not on file  Social History Narrative  . Not on file   Social Determinants of Health   Financial Resource Strain:   . Difficulty of Paying Living Expenses: Not on file  Food Insecurity:   . Worried About Charity fundraiser in the Last Year: Not on file  . Ran Out of Food in the Last Year: Not on file  Transportation Needs:   . Lack of Transportation (Medical): Not on file  . Lack of Transportation (Non-Medical): Not on file  Physical Activity:   . Days of Exercise per Week: Not on file  . Minutes of Exercise per Session: Not on file  Stress:   . Feeling of Stress : Not on file  Social Connections:   . Frequency of Communication with Friends and Family: Not on file  . Frequency of Social Gatherings with Friends and Family: Not on file  . Attends Religious Services: Not on file  . Active Member of Clubs or Organizations: Not on file  . Attends Archivist Meetings: Not on file  . Marital Status:  Not on file  Intimate Partner Violence:   . Fear of Current or Ex-Partner: Not on file  . Emotionally Abused: Not on file  . Physically Abused: Not on file  . Sexually Abused: Not on file   Social History   Tobacco Use  Smoking Status Former Smoker  . Packs/day: 0.50  . Years: 26.00  . Pack years: 13.00  . Types: Cigarettes  . Quit date: 01/13/2017  . Years since quitting: 2.6  Smokeless Tobacco Never Used  Tobacco Comment   PT STARTED TAKING ZYBAN ON 01-08-17 IN HOPES OF QUITTING   Social History  Substance and Sexual Activity  Alcohol Use No    Family History:  Family History  Problem Relation Age of Onset  . Diabetes Mother   . Migraines Mother   . Cancer Neg Hx   . COPD Neg Hx   . Heart disease Neg Hx   . Stroke Neg Hx     Past medical history, surgical history, medications, allergies, family history and social history reviewed with patient today and changes made to appropriate areas of the chart.   Review of Systems - General ROS: negative Psychological ROS: negative Ophthalmic ROS: negative ENT ROS: negative Allergy and Immunology ROS: negative Hematological and Lymphatic ROS: negative Endocrine ROS: negative Breast ROS: negative for breast lumps Respiratory ROS: no cough, shortness of breath, or wheezing Cardiovascular ROS: no chest pain or dyspnea on exertion Gastrointestinal ROS: no abdominal pain, change in bowel habits, or black or bloody stools Genito-Urinary ROS: no dysuria, trouble voiding, or hematuria Musculoskeletal ROS: negative Neurological ROS: no TIA or stroke symptoms Dermatological ROS: negative All other ROS negative except what is listed above and in the HPI.      Objective:    BP 129/78   Pulse 81   Temp 98 F (36.7 C) (Oral)   Ht 5\' 8"  (1.727 m)   Wt 211 lb (95.7 kg)   SpO2 96%   BMI 32.08 kg/m   Wt Readings from Last 3 Encounters:  09/05/19 211 lb (95.7 kg)  03/29/19 214 lb (97.1 kg)  02/28/19 210 lb (95.3 kg)      Physical Exam Vitals and nursing note reviewed.  Constitutional:      General: He is not in acute distress.    Appearance: He is well-developed.  HENT:     Head: Atraumatic.     Right Ear: Tympanic membrane and external ear normal.     Left Ear: Tympanic membrane and external ear normal.     Nose: Nose normal.     Mouth/Throat:     Mouth: Mucous membranes are moist.     Pharynx: Oropharynx is clear.  Eyes:     General: No scleral icterus.    Conjunctiva/sclera: Conjunctivae normal.     Pupils: Pupils are equal, round, and reactive to light.  Cardiovascular:     Rate and Rhythm: Normal rate and regular rhythm.     Heart sounds: Normal heart sounds. No murmur.  Pulmonary:     Effort: Pulmonary effort is normal. No respiratory distress.     Breath sounds: Normal breath sounds.  Abdominal:     General: Bowel sounds are normal. There is no distension.     Palpations: Abdomen is soft. There is no mass.     Tenderness: There is no abdominal tenderness. There is no guarding.  Genitourinary:    Comments: GU exam declined Musculoskeletal:        General: No tenderness.     Cervical back: Normal range of motion and neck supple.     Comments: ROM decreased in left shoulder but otherwise intact  Skin:    General: Skin is warm and dry.     Findings: No rash.  Neurological:     General: No focal deficit present.     Mental Status: He is alert and oriented to person, place, and time.     Deep Tendon Reflexes: Reflexes are normal and symmetric.  Psychiatric:        Mood and Affect: Mood normal.        Behavior: Behavior normal.  Thought Content: Thought content normal.        Judgment: Judgment normal.     Results for orders placed or performed in visit on 08/02/19  Hepatic function panel  Result Value Ref Range   Total Protein 7.1 6.0 - 8.5 g/dL   Albumin 4.4 4.0 - 5.0 g/dL   Bilirubin Total 0.8 0.0 - 1.2 mg/dL   Bilirubin, Direct 0.23 0.00 - 0.40 mg/dL   Alkaline  Phosphatase 168 (H) 39 - 117 IU/L   AST 58 (H) 0 - 40 IU/L   ALT 105 (H) 0 - 44 IU/L      Assessment & Plan:   Problem List Items Addressed This Visit      Cardiovascular and Mediastinum   Essential hypertension - Primary    BPs stable and WNL, continue current regimen      Relevant Medications   rosuvastatin (CRESTOR) 10 MG tablet   losartan (COZAAR) 50 MG tablet   Other Relevant Orders   CBC with Differential/Platelet   Comprehensive metabolic panel   UA/M w/rflx Culture, Routine   Migraines    Stable and under good control with prn maxalt, continue current regimen      Relevant Medications   rizatriptan (MAXALT) 10 MG tablet   rosuvastatin (CRESTOR) 10 MG tablet   losartan (COZAAR) 50 MG tablet     Digestive   GERD (gastroesophageal reflux disease)    Stable and under good control, continue current regimen      Relevant Medications   pantoprazole (PROTONIX) 20 MG tablet     Endocrine   Type 2 diabetes mellitus without complication, without long-term current use of insulin (HCC)    Recheck A1C, adjust as needed. Continue current regimen      Relevant Medications   rosuvastatin (CRESTOR) 10 MG tablet   losartan (COZAAR) 50 MG tablet     Other   Hyperlipidemia    Recheck lipids, adjust as needed. Continue current regimen and working on lifestyle modifications      Relevant Medications   rosuvastatin (CRESTOR) 10 MG tablet   losartan (COZAAR) 50 MG tablet   Other Relevant Orders   Lipid Panel w/o Chol/HDL Ratio    Other Visit Diagnoses    Annual physical exam       Chronic left shoulder pain       S/p arthroscopy without relief after consulting 2 specialists. Will place referral for another opinion   Relevant Orders   AMB referral to orthopedics       Discussed aspirin prophylaxis for myocardial infarction prevention and decision was recommended  LABORATORY TESTING:  Health maintenance labs ordered today as discussed above.   The natural history  of prostate cancer and ongoing controversy regarding screening and potential treatment outcomes of prostate cancer has been discussed with the patient. The meaning of a false positive PSA and a false negative PSA has been discussed. He indicates understanding of the limitations of this screening test and wishes not to proceed with screening PSA testing.   IMMUNIZATIONS:   - Tdap: Tetanus vaccination status reviewed: last tetanus booster within 10 years. - Influenza: Up to date  PATIENT COUNSELING:    Sexuality: Discussed sexually transmitted diseases, partner selection, use of condoms, avoidance of unintended pregnancy  and contraceptive alternatives.   Advised to avoid cigarette smoking.  I discussed with the patient that most people either abstain from alcohol or drink within safe limits (<=14/week and <=4 drinks/occasion for males, <=7/weeks and <= 3 drinks/occasion for females)  and that the risk for alcohol disorders and other health effects rises proportionally with the number of drinks per week and how often a drinker exceeds daily limits.  Discussed cessation/primary prevention of drug use and availability of treatment for abuse.   Diet: Encouraged to adjust caloric intake to maintain  or achieve ideal body weight, to reduce intake of dietary saturated fat and total fat, to limit sodium intake by avoiding high sodium foods and not adding table salt, and to maintain adequate dietary potassium and calcium preferably from fresh fruits, vegetables, and low-fat dairy products.    stressed the importance of regular exercise  Injury prevention: Discussed safety belts, safety helmets, smoke detector, smoking near bedding or upholstery.   Dental health: Discussed importance of regular tooth brushing, flossing, and dental visits.   Follow up plan: NEXT PREVENTATIVE PHYSICAL DUE IN 1 YEAR. Return in about 6 months (around 03/04/2020) for 6 month f/u.

## 2019-09-06 LAB — URINE CULTURE: Culture: NO GROWTH

## 2019-09-12 ENCOUNTER — Encounter: Payer: Self-pay | Admitting: Family Medicine

## 2019-09-12 ENCOUNTER — Other Ambulatory Visit: Payer: Self-pay | Admitting: Family Medicine

## 2019-09-12 MED ORDER — METFORMIN HCL 500 MG PO TABS: 500.0000 mg | ORAL_TABLET | Freq: Two times a day (BID) | ORAL | 2 refills | Status: DC

## 2019-09-16 NOTE — Assessment & Plan Note (Signed)
Stable and under good control, continue current regimen 

## 2019-09-16 NOTE — Assessment & Plan Note (Signed)
Recheck A1C, adjust as needed. Continue current regimen. 

## 2019-09-16 NOTE — Assessment & Plan Note (Signed)
Stable and under good control with prn maxalt, continue current regimen

## 2019-09-16 NOTE — Assessment & Plan Note (Signed)
Recheck lipids, adjust as needed. Continue current regimen and working on lifestyle modifications

## 2019-09-16 NOTE — Assessment & Plan Note (Signed)
BPs stable and WNL, continue current regimen 

## 2019-10-23 ENCOUNTER — Ambulatory Visit (INDEPENDENT_AMBULATORY_CARE_PROVIDER_SITE_OTHER): Payer: 59 | Admitting: Gastroenterology

## 2019-10-23 ENCOUNTER — Encounter: Payer: Self-pay | Admitting: Gastroenterology

## 2019-10-23 DIAGNOSIS — R7989 Other specified abnormal findings of blood chemistry: Secondary | ICD-10-CM

## 2019-10-23 DIAGNOSIS — K219 Gastro-esophageal reflux disease without esophagitis: Secondary | ICD-10-CM | POA: Diagnosis not present

## 2019-10-23 MED ORDER — PANTOPRAZOLE SODIUM 40 MG PO TBEC: 40.0000 mg | DELAYED_RELEASE_TABLET | Freq: Every day | ORAL | 0 refills | Status: DC

## 2019-10-23 NOTE — Progress Notes (Signed)
[name], MD [address]  [place]  [place],  [address]  Main: [phone]  Fax: [phone]   Primary Care Physician: Volney American, PA-C  Virtual Visit via Video Note  I connected with patient on 10/23/19 at 10:15 AM EDT by video (using [email or web address]) and verified that I am speaking with the correct person using two identifiers.   I discussed the limitations, risks, security and privacy concerns of performing an evaluation and management service by video and the availability of in person appointments. I also discussed with the patient that there may be a patient responsible charge related to this service. The patient expressed understanding and agreed to proceed.  Location of Patient: Home Location of Provider: Home Persons involved: Patient and provider only (Nursing staff checked in patient via phone but were not physically involved in the video interaction - see their notes)   History of Present Illness: Chief Complaint  Patient presents with  . Follow-up elevated liver enzymes    HPI: Keith Barber is a 49 y.o. male here for follow-up of reflux and fatty liver.  Is taking Protonix 20 mg once daily and has had much better control of symptoms with the Protonix then before the medication.  However, is reporting breakthrough symptoms once or twice a week that are severe and last all day.  No nausea or vomiting.  No weight loss.  Is also trying to make diet changes.  Used to drink [employer] drinks daily, now is choosing sugar-free alternatives for the same given that his A1c was recently also elevated.  No dysphagia.  No altered bowel habits.  Previous history: Manometry and pH testing was ordered but patient could not tolerate the pH catheter only manometry was done which showed a normal motility study.  EGD July 2019 showed normal esophagus, regular Z line at 39 cm, gastric erythema, normal duodenum. Pathology negative for H.  pylori. Colonoscopy July 2019 done for screening and history of diverticulitis, with one 3 mm rectosigmoid polyp removed, diverticulosis seen, internal hemorrhoids, otherwise normal. Repeat recommended in 5 years. Pathology showed hyperplastic polyp.  CT abdomen in April 2019 showed extensive hepatic steatosis. Follow-up blood work in June 2019, with viral and autoimmune hepatitis work-up was negative.Alk phos mildly chronically elevated, with most recent being fromFebruary 2020 around 120. Patient denies any pruritus. GGT mildly elevated as well.  Current Outpatient Medications  Medication Sig Dispense Refill  . loperamide (IMODIUM) 2 MG capsule Take 2-4 mg by mouth as needed for diarrhea or loose stools.    Marland Kitchen losartan (COZAAR) 50 MG tablet Take 1 tablet (50 mg total) by mouth daily. 90 tablet 1  . metFORMIN (GLUCOPHAGE) 500 MG tablet Take 1 tablet (500 mg total) by mouth 2 (two) times daily with a meal. 60 tablet 2  . NAPROXEN SODIUM PO Take by mouth 2 (two) times daily.     . pantoprazole (PROTONIX) 20 MG tablet Take 20 mg by mouth daily.    . rosuvastatin (CRESTOR) 10 MG tablet Take 1 tablet (10 mg total) by mouth daily. 90 tablet 1  . pantoprazole (PROTONIX) 40 MG tablet Take 1 tablet (40 mg total) by mouth daily. 30 tablet 0  . rizatriptan (MAXALT) 10 MG tablet Take 1 tablet (10 mg total) by mouth as needed for migraine. May repeat in 2 hours if needed. Max of 2 tabs daily (Patient not taking: Reported on 10/23/2019) 10 tablet 5   No current facility-administered medications for this visit.  Allergies as of 10/23/2019  . (No Known Allergies)    Review of Systems:    All systems reviewed and negative except where noted in HPI.   Observations/Objective:  Labs: CMP     Component Value Date/Time   NA 138 09/05/2019 1014   NA 140 08/30/2018 1441   K 4.3 09/05/2019 1014   CL 102 09/05/2019 1014   CO2 26 09/05/2019 1014   GLUCOSE 152 (H) 09/05/2019 1014   BUN 12  09/05/2019 1014   BUN 12 08/30/2018 1441   CREATININE 0.95 09/05/2019 1014   CALCIUM 9.0 09/05/2019 1014   PROT 8.2 (H) 09/05/2019 1014   PROT 7.1 08/07/2019 1534   ALBUMIN 4.5 09/05/2019 1014   ALBUMIN 4.4 08/07/2019 1534   AST 59 (H) 09/05/2019 1014   ALT 96 (H) 09/05/2019 1014   ALKPHOS 128 (H) 09/05/2019 1014   BILITOT 1.4 (H) 09/05/2019 1014   BILITOT 0.8 08/07/2019 1534   GFRNONAA >60 09/05/2019 1014   GFRAA >60 09/05/2019 1014   Lab Results  Component Value Date   WBC 8.9 09/05/2019   HGB 17.7 (H) 09/05/2019   HCT 52.5 (H) 09/05/2019   MCV 89.4 09/05/2019   PLT 187 09/05/2019    Imaging Studies: No results found.  Assessment and Plan:   Keith Barber is a 49 y.o. y/o male here for follow-up of reflux and elevated liver enzymes due to hepatic steatosis  Assessment and Plan: Due to breakthrough symptoms, increase Protonix to 40 mg once daily for 30 days, then back down to 20 mg once daily  Patient educated extensively on acid reflux lifestyle modification, including buying a bed wedge, not eating 3 hrs before bedtime, diet modifications, and handout given for the same.   (Risks of PPI use were discussed with patient including bone loss, C. Diff diarrhea, pneumonia, infections, CKD, electrolyte abnormalities.  Pt. Verbalizes understanding and chooses to continue the medication.)  Chronically elevated liver enzymes due to hepatic steatosis.  Continue to work on weight loss, diet and exercise  Obtain GGT, along with some other autoimmune markers to complete previously ordered labs  Follow Up Instructions:    I discussed the assessment and treatment plan with the patient. The patient was provided an opportunity to ask questions and all were answered. The patient agreed with the plan and demonstrated an understanding of the instructions.   The patient was advised to call back or seek an in-person evaluation if the symptoms worsen or if the condition fails to  improve as anticipated.  I provided 15 minutes of face-to-face time via video software during this encounter. Additional time was spent in reviewing patient's chart, placing orders etc.   Virgel Manifold, MD  Speech recognition software was used to dictate this note.

## 2019-10-26 LAB — HEPATIC FUNCTION PANEL
ALT: 61 IU/L — ABNORMAL HIGH (ref 0–44)
AST: 38 IU/L (ref 0–40)
Albumin: 4.7 g/dL (ref 4.0–5.0)
Alkaline Phosphatase: 147 IU/L — ABNORMAL HIGH (ref 39–117)
Bilirubin Total: 0.7 mg/dL (ref 0.0–1.2)
Bilirubin, Direct: 0.18 mg/dL (ref 0.00–0.40)
Total Protein: 7.6 g/dL (ref 6.0–8.5)

## 2019-10-26 LAB — IRON AND TIBC
Iron Saturation: 28 % (ref 15–55)
Iron: 84 ug/dL (ref 38–169)
Total Iron Binding Capacity: 305 ug/dL (ref 250–450)
UIBC: 221 ug/dL (ref 111–343)

## 2019-10-26 LAB — ANA: ANA Titer 1: NEGATIVE

## 2019-10-26 LAB — GAMMA GT: GGT: 134 IU/L — ABNORMAL HIGH (ref 0–65)

## 2019-10-26 LAB — IGG 4: IgG, Subclass 4: 78 mg/dL (ref 2–96)

## 2019-10-26 LAB — ANTI-MICROSOMAL ANTIBODY LIVER / KIDNEY: LKM1 Ab: 1 Units (ref 0.0–20.0)

## 2019-12-27 ENCOUNTER — Ambulatory Visit: Payer: 59 | Admitting: Family Medicine

## 2020-01-14 DIAGNOSIS — S46112A Strain of muscle, fascia and tendon of long head of biceps, left arm, initial encounter: Secondary | ICD-10-CM

## 2020-01-14 HISTORY — DX: Strain of muscle, fascia and tendon of long head of biceps, left arm, initial encounter: S46.112A

## 2020-01-16 ENCOUNTER — Emergency Department
Admission: EM | Admit: 2020-01-16 | Discharge: 2020-01-16 | Disposition: A | Discharge status: Home / Self Care | Payer: 59 | Attending: Emergency Medicine | Admitting: Emergency Medicine

## 2020-01-16 ENCOUNTER — Other Ambulatory Visit: Payer: Self-pay

## 2020-01-16 ENCOUNTER — Emergency Department: Payer: 59

## 2020-01-16 DIAGNOSIS — Z87891 Personal history of nicotine dependence: Secondary | ICD-10-CM | POA: Diagnosis not present

## 2020-01-16 DIAGNOSIS — Z79899 Other long term (current) drug therapy: Secondary | ICD-10-CM | POA: Diagnosis not present

## 2020-01-16 DIAGNOSIS — Y939 Activity, unspecified: Secondary | ICD-10-CM | POA: Insufficient documentation

## 2020-01-16 DIAGNOSIS — I10 Essential (primary) hypertension: Secondary | ICD-10-CM | POA: Insufficient documentation

## 2020-01-16 DIAGNOSIS — Y92410 Unspecified street and highway as the place of occurrence of the external cause: Secondary | ICD-10-CM | POA: Insufficient documentation

## 2020-01-16 DIAGNOSIS — R55 Syncope and collapse: Secondary | ICD-10-CM | POA: Insufficient documentation

## 2020-01-16 DIAGNOSIS — S80811A Abrasion, right lower leg, initial encounter: Secondary | ICD-10-CM | POA: Insufficient documentation

## 2020-01-16 DIAGNOSIS — Z23 Encounter for immunization: Secondary | ICD-10-CM | POA: Insufficient documentation

## 2020-01-16 DIAGNOSIS — Y999 Unspecified external cause status: Secondary | ICD-10-CM | POA: Insufficient documentation

## 2020-01-16 DIAGNOSIS — E860 Dehydration: Secondary | ICD-10-CM | POA: Insufficient documentation

## 2020-01-16 DIAGNOSIS — S1091XA Abrasion of unspecified part of neck, initial encounter: Secondary | ICD-10-CM | POA: Insufficient documentation

## 2020-01-16 DIAGNOSIS — S80812A Abrasion, left lower leg, initial encounter: Secondary | ICD-10-CM | POA: Diagnosis not present

## 2020-01-16 DIAGNOSIS — E119 Type 2 diabetes mellitus without complications: Secondary | ICD-10-CM | POA: Insufficient documentation

## 2020-01-16 DIAGNOSIS — Z7984 Long term (current) use of oral hypoglycemic drugs: Secondary | ICD-10-CM | POA: Diagnosis not present

## 2020-01-16 DIAGNOSIS — S199XXA Unspecified injury of neck, initial encounter: Secondary | ICD-10-CM | POA: Diagnosis present

## 2020-01-16 LAB — CBC
HCT: 50.3 % (ref 39.0–52.0)
Hemoglobin: 18.2 g/dL — ABNORMAL HIGH (ref 13.0–17.0)
MCH: 30.4 pg (ref 26.0–34.0)
MCHC: 36.2 g/dL — ABNORMAL HIGH (ref 30.0–36.0)
MCV: 84.1 fL (ref 80.0–100.0)
Platelets: 222 10*3/uL (ref 150–400)
RBC: 5.98 MIL/uL — ABNORMAL HIGH (ref 4.22–5.81)
RDW: 12.2 % (ref 11.5–15.5)
WBC: 11.7 10*3/uL — ABNORMAL HIGH (ref 4.0–10.5)
nRBC: 0 % (ref 0.0–0.2)

## 2020-01-16 LAB — URINE DRUG SCREEN, QUALITATIVE (ARMC ONLY)
Amphetamines, Ur Screen: NOT DETECTED
Barbiturates, Ur Screen: NOT DETECTED
Benzodiazepine, Ur Scrn: NOT DETECTED
Cannabinoid 50 Ng, Ur ~~LOC~~: NOT DETECTED
Cocaine Metabolite,Ur ~~LOC~~: NOT DETECTED
MDMA (Ecstasy)Ur Screen: NOT DETECTED
Methadone Scn, Ur: NOT DETECTED
Opiate, Ur Screen: NOT DETECTED
Phencyclidine (PCP) Ur S: NOT DETECTED
Tricyclic, Ur Screen: NOT DETECTED

## 2020-01-16 LAB — URINALYSIS, COMPLETE (UACMP) WITH MICROSCOPIC
Bacteria, UA: NONE SEEN
Bilirubin Urine: NEGATIVE
Glucose, UA: 50 mg/dL — AB
Ketones, ur: 20 mg/dL — AB
Leukocytes,Ua: NEGATIVE
Nitrite: NEGATIVE
Protein, ur: NEGATIVE mg/dL
Specific Gravity, Urine: 1.04 — ABNORMAL HIGH (ref 1.005–1.030)
Squamous Epithelial / HPF: NONE SEEN (ref 0–5)
WBC, UA: NONE SEEN WBC/hpf (ref 0–5)
pH: 6 (ref 5.0–8.0)

## 2020-01-16 LAB — BASIC METABOLIC PANEL
Anion gap: 15 (ref 5–15)
Anion gap: 12 (ref 5–15)
BUN: 14 mg/dL (ref 6–20)
BUN: 12 mg/dL (ref 6–20)
CO2: 16 mmol/L — ABNORMAL LOW (ref 22–32)
CO2: 22 mmol/L (ref 22–32)
Calcium: 9.4 mg/dL (ref 8.9–10.3)
Calcium: 8.4 mg/dL — ABNORMAL LOW (ref 8.9–10.3)
Chloride: 106 mmol/L (ref 98–111)
Chloride: 106 mmol/L (ref 98–111)
Creatinine, Ser: 1.23 mg/dL (ref 0.61–1.24)
Creatinine, Ser: 0.94 mg/dL (ref 0.61–1.24)
GFR calc Af Amer: >60 mL/min (ref >60)
GFR calc Af Amer: >60 mL/min (ref >60)
GFR calc non Af Amer: >60 mL/min (ref >60)
GFR calc non Af Amer: >60 mL/min (ref >60)
Glucose, Bld: 158 mg/dL — ABNORMAL HIGH (ref 70–99)
Glucose, Bld: 114 mg/dL — ABNORMAL HIGH (ref 70–99)
Potassium: 3.4 mmol/L — ABNORMAL LOW (ref 3.5–5.1)
Potassium: 3.4 mmol/L — ABNORMAL LOW (ref 3.5–5.1)
Sodium: 137 mmol/L (ref 135–145)
Sodium: 140 mmol/L (ref 135–145)

## 2020-01-16 LAB — TROPONIN I (HIGH SENSITIVITY)
Troponin I (High Sensitivity): 4 ng/L (ref <18)
Troponin I (High Sensitivity): 7 ng/L (ref <18)

## 2020-01-16 LAB — HEPATIC FUNCTION PANEL
ALT: 59 U/L — ABNORMAL HIGH (ref 0–44)
AST: 48 U/L — ABNORMAL HIGH (ref 15–41)
Albumin: 4.6 g/dL (ref 3.5–5.0)
Alkaline Phosphatase: 89 U/L (ref 38–126)
Bilirubin, Direct: 0.2 mg/dL (ref 0.0–0.2)
Indirect Bilirubin: 1.3 mg/dL — ABNORMAL HIGH (ref 0.3–0.9)
Total Bilirubin: 1.5 mg/dL — ABNORMAL HIGH (ref 0.3–1.2)
Total Protein: 8 g/dL (ref 6.5–8.1)

## 2020-01-16 LAB — ETHANOL: Alcohol, Ethyl (B): <10 mg/dL (ref <10)

## 2020-01-16 LAB — GLUCOSE, CAPILLARY: Glucose-Capillary: 148 mg/dL — ABNORMAL HIGH (ref 70–99)

## 2020-01-16 MED ORDER — IOHEXOL 350 MG/ML SOLN
75.0000 mL | Freq: Once | INTRAVENOUS | Status: AC | PRN
  Administered 2020-01-16: 75 mL via INTRAVENOUS

## 2020-01-16 MED ORDER — SODIUM CHLORIDE 0.9 % IV BOLUS
1000.0000 mL | Freq: Once | INTRAVENOUS | Status: AC
  Administered 2020-01-16: 1000 mL via INTRAVENOUS

## 2020-01-16 MED ORDER — LORAZEPAM 2 MG/ML IJ SOLN
1.0000 mg | Freq: Once | INTRAMUSCULAR | Status: AC
  Administered 2020-01-16: 1 mg via INTRAVENOUS
  Filled 2020-01-16: qty 1

## 2020-01-16 MED ORDER — TETANUS-DIPHTH-ACELL PERTUSSIS 5-2.5-18.5 LF-MCG/0.5 IM SUSP
0.5000 mL | Freq: Once | INTRAMUSCULAR | Status: AC
  Administered 2020-01-16: 0.5 mL via INTRAMUSCULAR
  Filled 2020-01-16: qty 0.5

## 2020-01-16 MED ORDER — SODIUM CHLORIDE 0.9 % IV BOLUS: 1000.0000 mL | Freq: Once | INTRAVENOUS | Status: DC

## 2020-01-16 MED ORDER — BACITRACIN ZINC 500 UNIT/GM EX OINT
TOPICAL_OINTMENT | Freq: Once | CUTANEOUS | Status: AC
  Administered 2020-01-16: 2 via TOPICAL
  Filled 2020-01-16: qty 1.8

## 2020-01-16 MED ORDER — ONDANSETRON HCL 4 MG/2ML IJ SOLN
4.0000 mg | Freq: Once | INTRAMUSCULAR | Status: AC
  Administered 2020-01-16: 4 mg via INTRAVENOUS
  Filled 2020-01-16: qty 2
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
## 2020-01-16 NOTE — ED Provider Notes (Signed)
I assumed care of the patient from Dr. Ellender Hose at 9:00 PM with recommendation follow-up on repeat troponin which was negative.  Patient be discharged home as instructed.   Gregor Hams, MD 01/16/20 [phone]

## 2020-01-17 ENCOUNTER — Encounter: Payer: Self-pay | Admitting: Family Medicine

## 2020-01-17 ENCOUNTER — Ambulatory Visit (INDEPENDENT_AMBULATORY_CARE_PROVIDER_SITE_OTHER): Payer: 59 | Admitting: Family Medicine

## 2020-01-17 VITALS — BP 118/77 | HR 92 | Temp 98.2°F | Wt 204.0 lb

## 2020-01-17 DIAGNOSIS — R55 Syncope and collapse: Secondary | ICD-10-CM | POA: Diagnosis not present

## 2020-01-17 DIAGNOSIS — E119 Type 2 diabetes mellitus without complications: Secondary | ICD-10-CM

## 2020-01-17 DIAGNOSIS — I10 Essential (primary) hypertension: Secondary | ICD-10-CM

## 2020-01-17 NOTE — Patient Instructions (Signed)
Cut your losartan in half and continue to monitor your blood pressures closely  Work on increasing water intake and protein and fiber rich foods  Let us know if you feel lightheaded or dizzy again and we will get you to Neurology to evaluate further
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
## 2020-01-22 NOTE — Assessment & Plan Note (Signed)
Increase protein and fiber intake, reduce simple carbohydrates. Monitor for hypoglycemic episodes

## 2020-01-22 NOTE — Assessment & Plan Note (Signed)
Trial cutting losartan in half, monitor home readings. BPs running low normal consistently. Stay well hydrated

## 2020-01-23 ENCOUNTER — Ambulatory Visit (INDEPENDENT_AMBULATORY_CARE_PROVIDER_SITE_OTHER): Payer: 59 | Admitting: Gastroenterology

## 2020-01-23 ENCOUNTER — Encounter: Payer: Self-pay | Admitting: Gastroenterology

## 2020-01-23 ENCOUNTER — Other Ambulatory Visit: Payer: Self-pay

## 2020-01-23 VITALS — BP 113/82 | HR 94 | Temp 98.4°F | Wt 205.0 lb

## 2020-01-23 DIAGNOSIS — K219 Gastro-esophageal reflux disease without esophagitis: Secondary | ICD-10-CM | POA: Diagnosis not present

## 2020-01-23 DIAGNOSIS — K760 Fatty (change of) liver, not elsewhere classified: Secondary | ICD-10-CM
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
## 2020-01-25 IMAGING — CT CT ABD-PELV W/O CM
2 of 4 series · 16 of 46 positions shown, 18 images · non-contrast
Comparison: None.

CLINICAL DATA: Left lower quadrant abdominal pain beginning
yesterday morning.

EXAM:
CT ABDOMEN AND PELVIS WITHOUT CONTRAST
TECHNIQUE: Multidetector CT imaging of the abdomen and pelvis was performed
following the standard protocol without IV contrast.

[Series 2: routine abd/pel wo · axial · 0.85mm/px · z∈[-510,-35]mm · 13 of 105 slices shown, 15 images]
[im 5/105  soft-tissue]
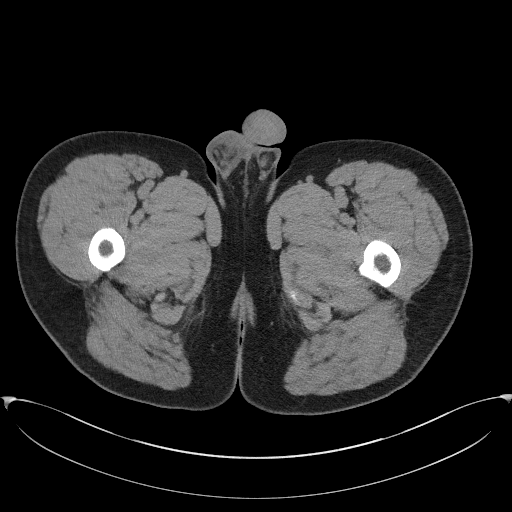
[im 5/105  bone]
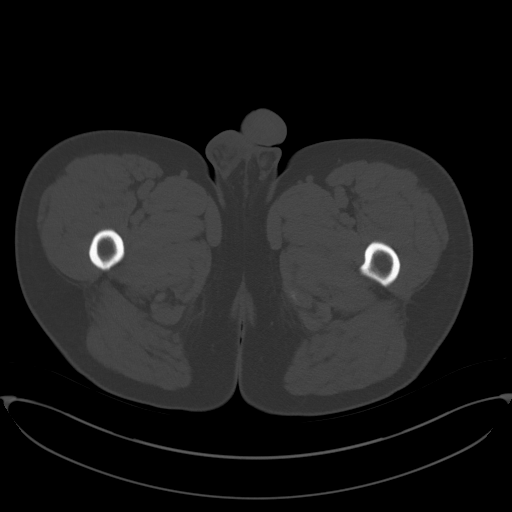
[im 14/105  soft-tissue]
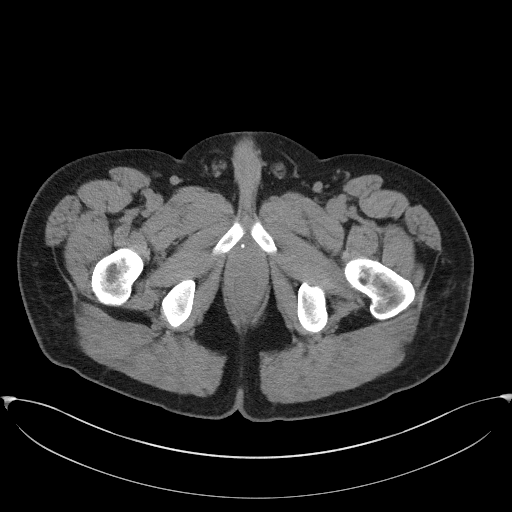
[im 23/105  soft-tissue]
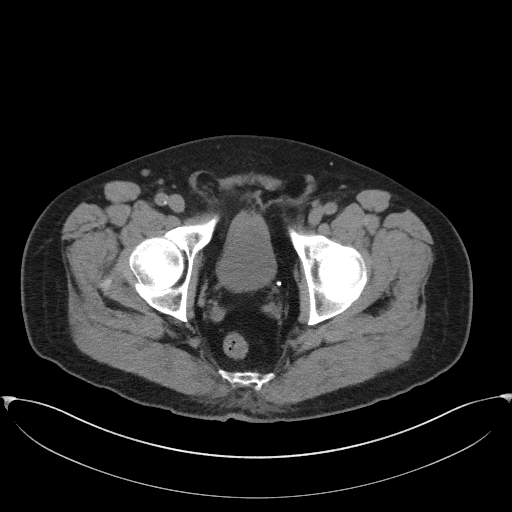
[im 28/105  soft-tissue]
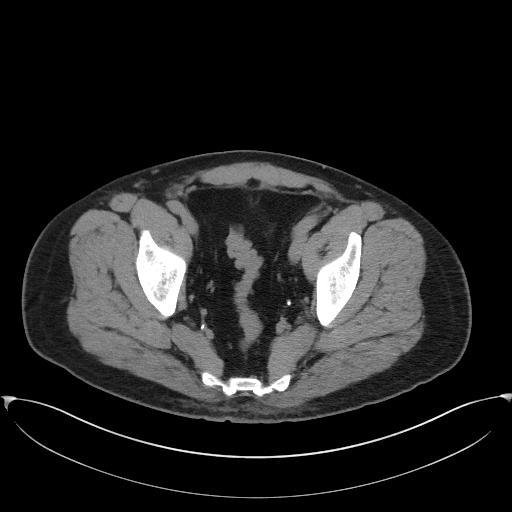
[im 37/105  soft-tissue]
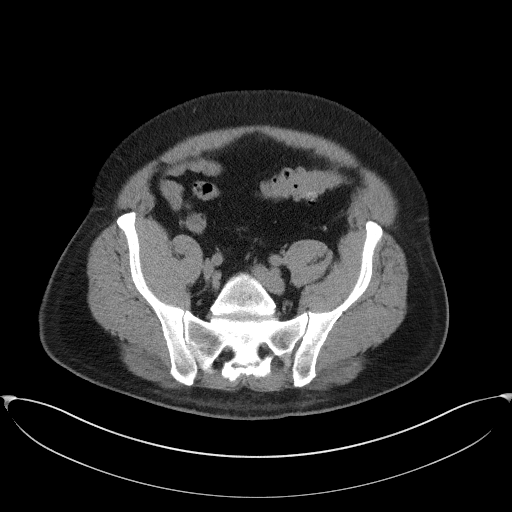
[im 46/105  soft-tissue]
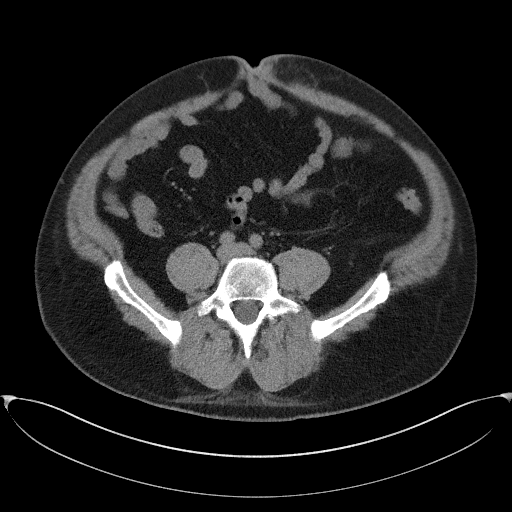
[im 55/105  soft-tissue]
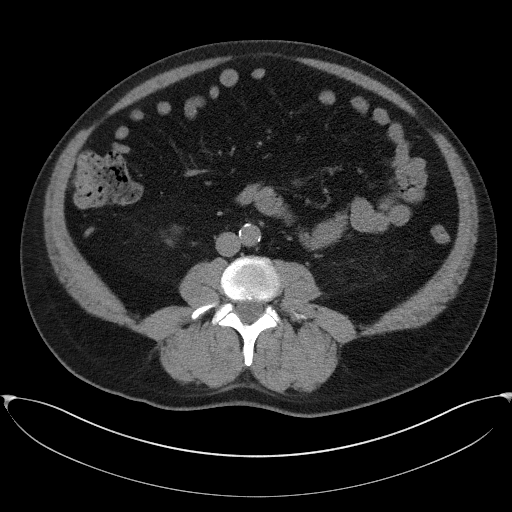
[im 59/105  soft-tissue]
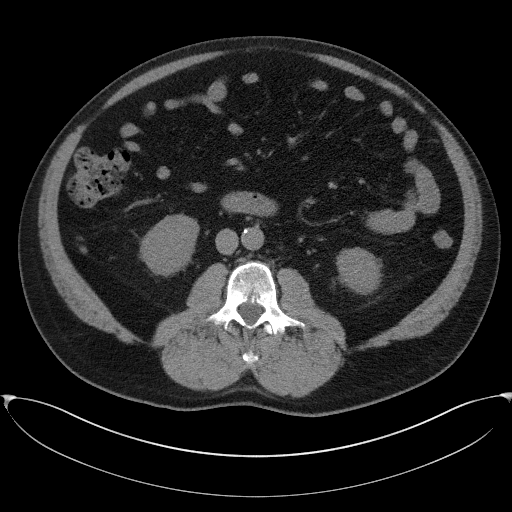
[im 68/105  soft-tissue]
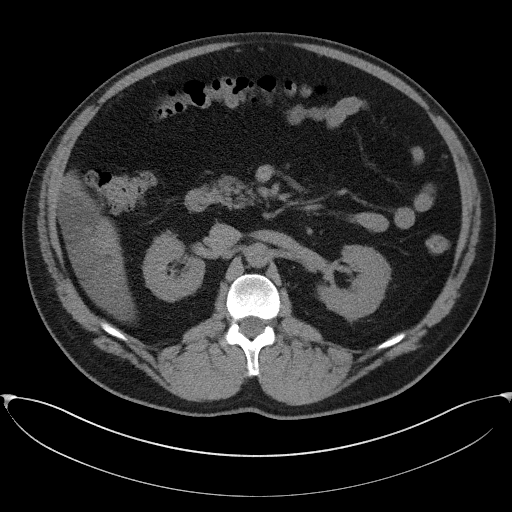
[im 68/105  bone]
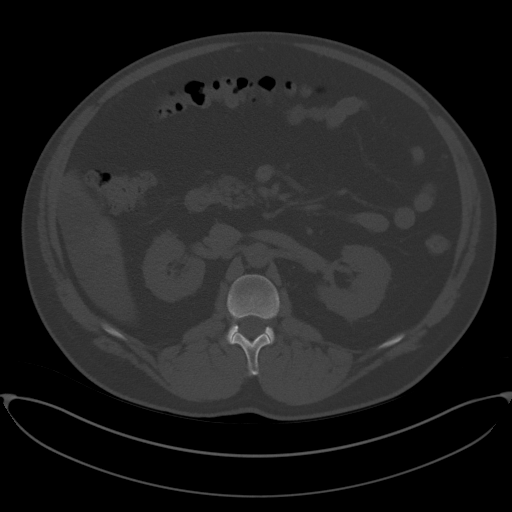
[im 77/105  soft-tissue]
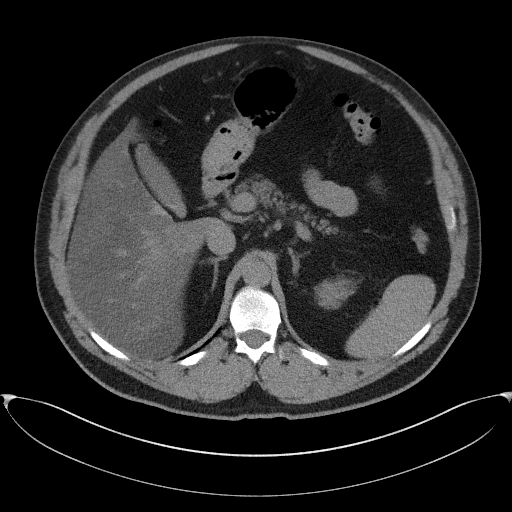
[im 82/105  soft-tissue]
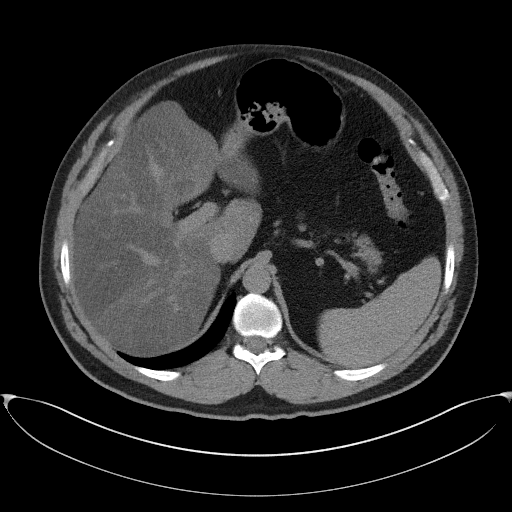
[im 91/105  soft-tissue]
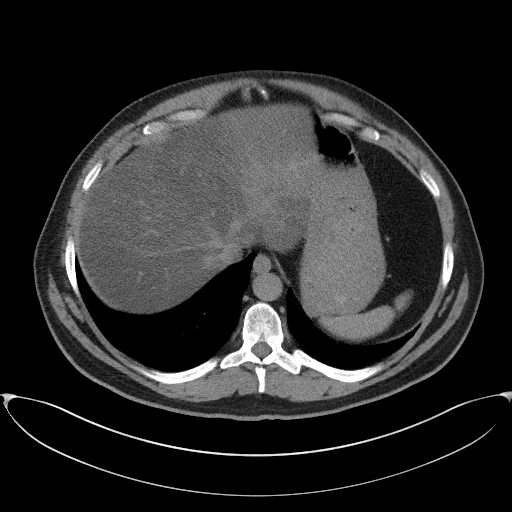
[im 100/105  soft-tissue]
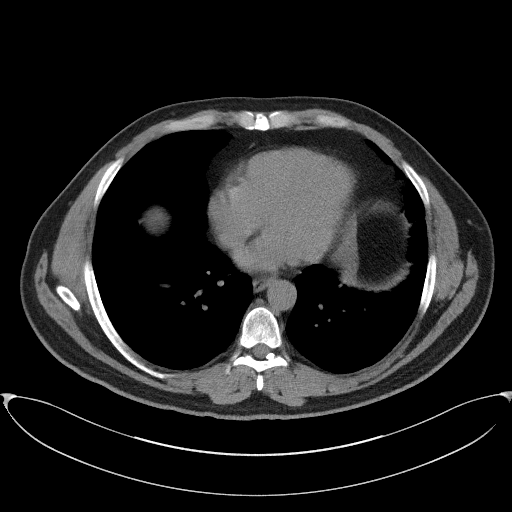

[Series 5: coronal st · coronal · 0.89mm/px · 3 of 113 slices shown]
[im 38/113  soft-tissue]
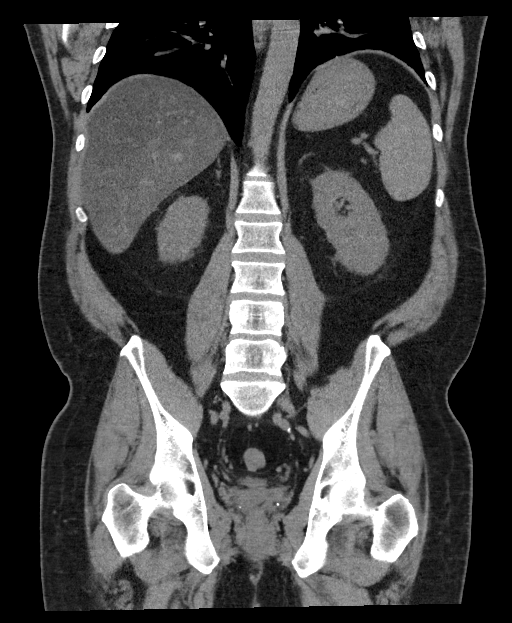
[im 50/113  soft-tissue]
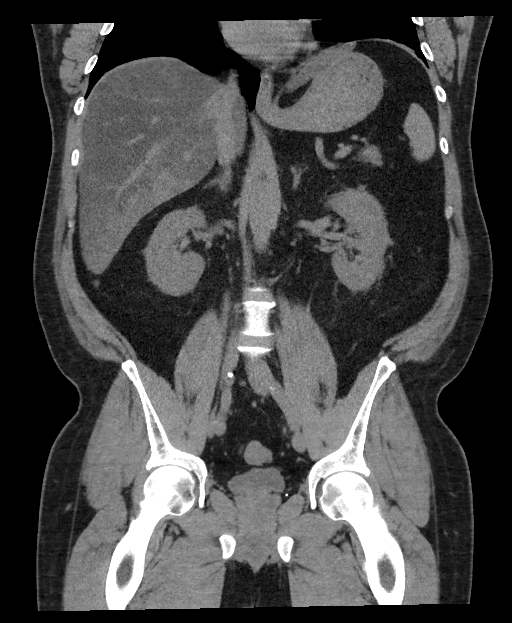
[im 63/113  soft-tissue]
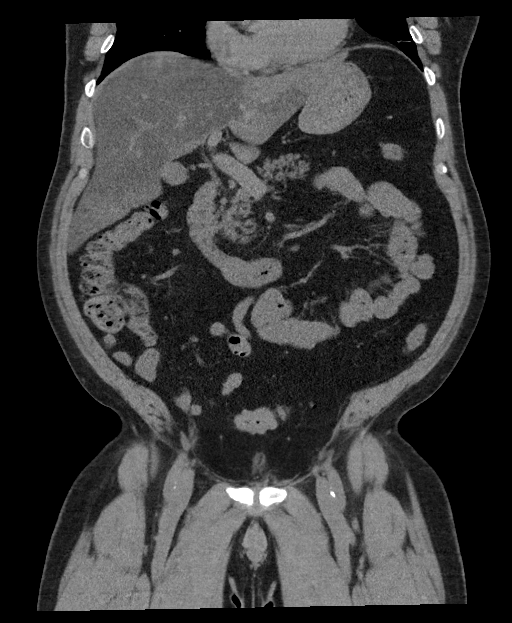

[16 of 46 positions shown; findings below may reference images not displayed]

FINDINGS: Lower chest: The lung bases are clear without focal nodule, mass, or
airspace disease. Heart size is normal. No significant pleural
effusion is present.

Hepatobiliary: Diffuse fatty infiltration liver is present. There is
some sparing in the left lobe adjacent to the gallbladder fossa. The
gallbladder and common bile duct are within normal limits.

Pancreas: Unremarkable. No pancreatic ductal dilatation or
surrounding inflammatory changes.

Spleen: Normal in size without focal abnormality.

Adrenals/Urinary Tract: Adrenal glands are unremarkable. Kidneys are
normal, without renal calculi, focal lesion, or hydronephrosis.
Bladder is unremarkable.

Stomach/Bowel: Stomach and duodenum are within normal limits. Small
bowel is unremarkable. The terminal ileum is within normal limits.
Appendix is visualized and normal. The ascending and transverse
colon are within normal limits. Descending colon is normal.
Diverticular changes are present in the sigmoid colon. [REDACTED]
changes are present proximally in the sigmoid colon compatible with
acute diverticulitis. There is no evidence for bowel perforation or
abscess.

Vascular/Lymphatic: Atherosclerotic calcifications are present in
the aorta and branch vessels without aneurysm.

Reproductive: Prostate is unremarkable.

Other: No abdominal wall hernia or abnormality. No abdominopelvic
ascites.

Musculoskeletal: Vertebral body heights alignment are maintained. No
focal lytic or blastic lesions are present. Bony pelvis is intact.
Hips are located and normal bilaterally.
IMPRESSION: 1. Sigmoid diverticulitis without evidence for perforation or
abscess.
2. Extensive hepatic steatosis.

## 2020-02-04 ENCOUNTER — Telehealth: Payer: Self-pay | Admitting: Family Medicine
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
## 2020-02-04 NOTE — Telephone Encounter (Signed)
Forms to be filled out tomorrow at patient's appointment.

## 2020-02-05 ENCOUNTER — Other Ambulatory Visit
Admission: RE | Admit: 2020-02-05 | Discharge: 2020-02-05 | Disposition: A | Discharge status: Home / Self Care | Payer: 59 | Attending: Family Medicine | Admitting: Family Medicine

## 2020-02-05 ENCOUNTER — Ambulatory Visit (INDEPENDENT_AMBULATORY_CARE_PROVIDER_SITE_OTHER): Payer: 59 | Admitting: Family Medicine

## 2020-02-05 ENCOUNTER — Other Ambulatory Visit: Payer: Self-pay

## 2020-02-05 ENCOUNTER — Encounter: Payer: Self-pay | Admitting: Family Medicine

## 2020-02-05 VITALS — BP 121/85 | HR 75 | Temp 98.3°F | Ht 68.0 in | Wt 205.0 lb

## 2020-02-05 DIAGNOSIS — E119 Type 2 diabetes mellitus without complications: Secondary | ICD-10-CM | POA: Diagnosis not present

## 2020-02-05 DIAGNOSIS — I10 Essential (primary) hypertension: Secondary | ICD-10-CM

## 2020-02-05 DIAGNOSIS — E876 Hypokalemia: Secondary | ICD-10-CM | POA: Diagnosis not present

## 2020-02-05 DIAGNOSIS — R55 Syncope and collapse: Secondary | ICD-10-CM

## 2020-02-05 LAB — BASIC METABOLIC PANEL
Anion gap: 8 (ref 5–15)
BUN: 16 mg/dL (ref 6–20)
CO2: 27 mmol/L (ref 22–32)
Calcium: 9.1 mg/dL (ref 8.9–10.3)
Chloride: 103 mmol/L (ref 98–111)
Creatinine, Ser: 1.16 mg/dL (ref 0.61–1.24)
GFR calc Af Amer: >60 mL/min (ref >60)
GFR calc non Af Amer: >60 mL/min (ref >60)
Glucose, Bld: 109 mg/dL — ABNORMAL HIGH (ref 70–99)
Potassium: 4.7 mmol/L (ref 3.5–5.1)
Sodium: 138 mmol/L (ref 135–145)

## 2020-02-05 LAB — HEMOGLOBIN A1C
Hgb A1c MFr Bld: 6 % — ABNORMAL HIGH (ref 4.8–5.6)
Mean Plasma Glucose: 126 mg/dL
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
## 2020-02-06 ENCOUNTER — Telehealth: Payer: Self-pay

## 2020-02-06 NOTE — Telephone Encounter (Signed)
Patient notified of paperwork ready to pick up.

## 2020-02-10 NOTE — Assessment & Plan Note (Signed)
Still having low normal BP readings despite cutting losartan in half. D/c and monitor home readings closely. Call if experiencing rebound HTN. Continue good lifestyle habits

## 2020-02-10 NOTE — Assessment & Plan Note (Signed)
Recheck A1C, adjust as needed. Continue lifestyle modifications, may be able to cut back or wean off metformin if continuing to make excellent changes and A1Cs stay under good control but continue for now

## 2020-02-14 LAB — HM DIABETES EYE EXAM

## 2020-03-05 ENCOUNTER — Encounter: Payer: Self-pay | Admitting: Family Medicine

## 2020-03-05 ENCOUNTER — Ambulatory Visit: Payer: 59 | Admitting: Family Medicine

## 2020-03-05 ENCOUNTER — Ambulatory Visit (INDEPENDENT_AMBULATORY_CARE_PROVIDER_SITE_OTHER): Payer: 59 | Admitting: Family Medicine

## 2020-03-05 ENCOUNTER — Other Ambulatory Visit: Payer: Self-pay

## 2020-03-05 VITALS — BP 153/106 | HR 90 | Temp 98.1°F | Wt 206.8 lb

## 2020-03-05 DIAGNOSIS — I10 Essential (primary) hypertension: Secondary | ICD-10-CM

## 2020-03-05 DIAGNOSIS — G43109 Migraine with aura, not intractable, without status migrainosus: Secondary | ICD-10-CM

## 2020-03-05 DIAGNOSIS — E119 Type 2 diabetes mellitus without complications: Secondary | ICD-10-CM

## 2020-03-05 MED ORDER — METFORMIN HCL 500 MG PO TABS: 500.0000 mg | ORAL_TABLET | Freq: Every day | ORAL | 0 refills | Status: DC

## 2020-03-05 MED ORDER — LOSARTAN POTASSIUM 25 MG PO TABS: 12.5000 mg | ORAL_TABLET | Freq: Every day | ORAL | 0 refills | Status: DC

## 2020-03-05 NOTE — Progress Notes (Signed)
BP (!) 153/106 (BP Location: Left Arm, Patient Position: Sitting, Cuff Size: Normal)   Pulse 90   Temp 98.1 F (36.7 C) (Oral)   Wt 206 lb 12.8 oz (93.8 kg)   SpO2 95%   BMI 31.44 kg/m    Subjective:    Patient ID: Keith Barber, male    DOB: Aug 26, 1970, 49 y.o.   MRN: [id number]  HPI: Keith Barber is a 49 y.o. male  Chief Complaint  Patient presents with  . Blood Pressure Check   Here today for HTN recheck after d/c of losartan. Was previously cutting 50 mg losartan in half previously but was having orthostatic sxs at times. States home readings on fitbit watch running in the 110-120/80s range. Denies CP, SOB, dizziness, syncope but does occasionally have posterior headaches, tension headaches. Trying to eat well and stay active.   Also having more migraines, sometimes some blurry vision with the pain. Recently had eye exam which was normal. Takes maxalt prn which helps. Has never been on a preventative medication for these in the past.   Relevant past medical, surgical, family and social history reviewed and updated as indicated. Interim medical history since our last visit reviewed. Allergies and medications reviewed and updated.  Review of Systems  Per HPI unless specifically indicated above     Objective:    BP (!) 153/106 (BP Location: Left Arm, Patient Position: Sitting, Cuff Size: Normal)   Pulse 90   Temp 98.1 F (36.7 C) (Oral)   Wt 206 lb 12.8 oz (93.8 kg)   SpO2 95%   BMI 31.44 kg/m   Wt Readings from Last 3 Encounters:  03/05/20 206 lb 12.8 oz (93.8 kg)  02/05/20 205 lb (93 kg)  01/23/20 205 lb (93 kg)    Physical Exam Vitals and nursing note reviewed.  Constitutional:      Appearance: Normal appearance.  HENT:     Head: Atraumatic.  Eyes:     Extraocular Movements: Extraocular movements intact.     Conjunctiva/sclera: Conjunctivae normal.  Cardiovascular:     Rate and Rhythm: Normal rate and regular rhythm.  Pulmonary:      Effort: Pulmonary effort is normal.     Breath sounds: Normal breath sounds.  Musculoskeletal:        General: Normal range of motion.     Cervical back: Normal range of motion and neck supple.  Skin:    General: Skin is warm and dry.  Neurological:     General: No focal deficit present.     Mental Status: He is oriented to person, place, and time.  Psychiatric:        Mood and Affect: Mood normal.        Thought Content: Thought content normal.        Judgment: Judgment normal.     Results for orders placed or performed during the hospital encounter of 02/05/20  Hemoglobin A1c  Result Value Ref Range   Hgb A1c MFr Bld 6.0 (H) 4.8 - 5.6 %   Mean Plasma Glucose 126 mg/dL  Basic metabolic panel  Result Value Ref Range   Sodium 138 135 - 145 mmol/L   Potassium 4.7 3.5 - 5.1 mmol/L   Chloride 103 98 - 111 mmol/L   CO2 27 22 - 32 mmol/L   Glucose, Bld 109 (H) 70 - 99 mg/dL   BUN 16 6 - 20 mg/dL   Creatinine, Ser 1.16 0.61 - 1.24 mg/dL   Calcium 9.1 8.9 -  10.3 mg/dL   GFR calc non Af Amer >60 >60 mL/min   GFR calc Af Amer >60 >60 mL/min   Anion gap 8 5 - 15      Assessment & Plan:   Problem List Items Addressed This Visit      Cardiovascular and Mediastinum   Essential hypertension - Primary    BPs elevated even upon recheck in office. Concern for inaccuracy of fitbit BP readings. Has traditional cuff at home that he will start using. Restart losartan, cut 25 mg tab in half and continue to monitor at home      Relevant Medications   losartan (COZAAR) 25 MG tablet   Migraines    Continue maxalt, work on BP control and stress control. May need to add preventative medication if still having frequent breakthrough      Relevant Medications   rizatriptan (MAXALT) 10 MG tablet   losartan (COZAAR) 25 MG tablet       Follow up plan: Return in about 4 weeks (around 04/02/2020) for BP f/u.

## 2020-03-06 ENCOUNTER — Telehealth: Payer: Self-pay

## 2020-03-06 NOTE — Telephone Encounter (Signed)
Pt stated he is here stating he has not taking the medication due to being unsure of how he is suppose to be taking the medication PT has questions about the amount he need to take  / losartan (COZAAR) 25 MG tablet unsure due to being told he was suppose to take 12.5 and is unsure if he should cut the medication or how to take. Pharmacist stated lowest does the have is 25mg  .Phamcy stated he should ask PCP. Pt also has questions about the pneumonia shot as well.

## 2020-03-06 NOTE — Telephone Encounter (Signed)
Copied from [place] [id number]. Topic: General - Other >> Mar 06, 2020  8:47 AM Hinda Lenis D wrote: PT has questions about the amount he need to take  / losartan (COZAAR) 25 MG tablet [[id number]]  / please advise

## 2020-03-06 NOTE — Telephone Encounter (Signed)
Routing to provider to advise. Written yesterday to take 1/2 tablet of the Losartan 25 mg. OV note is not finished so want provider clarification before calling the patient.

## 2020-03-07 NOTE — Telephone Encounter (Signed)
Yes, we discussed during his visit cutting the 25's in half because he was previously on 25 mg which was dropping his BP too low.

## 2020-03-07 NOTE — Telephone Encounter (Signed)
Patient notified

## 2020-03-09 NOTE — Assessment & Plan Note (Signed)
Continue maxalt, work on BP control and stress control. May need to add preventative medication if still having frequent breakthrough

## 2020-03-09 NOTE — Assessment & Plan Note (Signed)
BPs elevated even upon recheck in office. Concern for inaccuracy of fitbit BP readings. Has traditional cuff at home that he will start using. Restart losartan, cut 25 mg tab in half and continue to monitor at home

## 2020-03-18 ENCOUNTER — Telehealth: Payer: Self-pay | Admitting: Family Medicine

## 2020-03-18 MED ORDER — RIZATRIPTAN BENZOATE 10 MG PO TABS: 10.0000 mg | ORAL_TABLET | Freq: Two times a day (BID) | ORAL | 2 refills | Status: DC | PRN

## 2020-03-18 NOTE — Telephone Encounter (Signed)
Pt states he is out of rizatriptan medication, Pt requests a call when refill is sent or with information.

## 2020-03-18 NOTE — Telephone Encounter (Signed)
Previous RL patient. Patient last seen 03/05/20 and has appointment 04/02/20

## 2020-03-28 ENCOUNTER — Encounter: Payer: Self-pay | Admitting: Nurse Practitioner

## 2020-04-02 ENCOUNTER — Other Ambulatory Visit: Payer: Self-pay

## 2020-04-02 ENCOUNTER — Ambulatory Visit (INDEPENDENT_AMBULATORY_CARE_PROVIDER_SITE_OTHER): Payer: 59 | Admitting: Family Medicine

## 2020-04-02 ENCOUNTER — Encounter: Payer: Self-pay | Admitting: Family Medicine

## 2020-04-02 VITALS — BP 117/82 | HR 86 | Temp 98.3°F | Ht 68.0 in | Wt 204.0 lb

## 2020-04-02 DIAGNOSIS — G44209 Tension-type headache, unspecified, not intractable: Secondary | ICD-10-CM

## 2020-04-02 DIAGNOSIS — I10 Essential (primary) hypertension: Secondary | ICD-10-CM | POA: Diagnosis not present

## 2020-04-02 DIAGNOSIS — G43109 Migraine with aura, not intractable, without status migrainosus: Secondary | ICD-10-CM

## 2020-04-02 DIAGNOSIS — Z23 Encounter for immunization: Secondary | ICD-10-CM | POA: Diagnosis not present

## 2020-04-02 DIAGNOSIS — E1159 Type 2 diabetes mellitus with other circulatory complications: Secondary | ICD-10-CM

## 2020-04-02 DIAGNOSIS — I152 Hypertension secondary to endocrine disorders: Secondary | ICD-10-CM

## 2020-04-02 MED ORDER — METOPROLOL SUCCINATE ER 25 MG PO TB24: 25.0000 mg | ORAL_TABLET | Freq: Every day | ORAL | 3 refills | Status: DC

## 2020-04-02 NOTE — Assessment & Plan Note (Signed)
BP running well, but didn't take his losartan today. Unable to cut his losartan. Will stop losartan and start metoprolol to help with migraines. Aware that he will not be on a ACE/ARB, but risks of syncope outweigh the benefits of renal protection.

## 2020-04-02 NOTE — Progress Notes (Signed)
BP 117/82 (BP Location: Left Arm, Patient Position: Sitting)   Pulse 86   Temp 98.3 F (36.8 C) (Oral)   Ht 5\' 8"  (1.727 m)   Wt 204 lb (92.5 kg)   SpO2 95%   BMI 31.02 kg/m    Subjective:    Patient ID: Keith Barber, male    DOB: 02-27-1971, 49 y.o.   MRN: [id number]  HPI: Keith Barber is a 49 y.o. male  Chief Complaint  Patient presents with  . Hypertension    follow up  . pneumonia vaccine   HYPERTENSION- has been taking whole losartan every other day as med has been too small to cut.  Hypertension status: better  Satisfied with current treatment? yes Duration of hypertension: chronic BP monitoring frequency:  a few times a week BP range: 112-128/70s-80s BP medication side effects:  no Medication compliance: good compliance Previous BP meds: losartan Aspirin: no Recurrent headaches: yes Visual changes: no Palpitations: no Dyspnea: no Chest pain: no Lower extremity edema: no Dizzy/lightheaded: no  MIGRAINES Duration: years, but worse in the past 3 months Onset: sudden Severity: severe Quality: sharp and aching Frequency: intermittent Location: top of his head Headache duration: hours Radiation: yes Headache status at time of visit: current headache Treatments attempted: Treatments attempted: rest, ice, heat, APAP, ibuprofen and triptans   Aura: no Nausea:  no Vomiting: no Photophobia:  no Phonophobia:  no Effect on social functioning:  yes Confusion:  no Gait disturbance/ataxia:  no Behavioral changes:  no Fevers:  no  Relevant past medical, surgical, family and social history reviewed and updated as indicated. Interim medical history since our last visit reviewed. Allergies and medications reviewed and updated.  Review of Systems  Constitutional: Negative.   Respiratory: Negative.   Cardiovascular: Negative.   Gastrointestinal: Negative.   Musculoskeletal: Negative.   Neurological: Negative.   Psychiatric/Behavioral:  Negative.     Per HPI unless specifically indicated above     Objective:    BP 117/82 (BP Location: Left Arm, Patient Position: Sitting)   Pulse 86   Temp 98.3 F (36.8 C) (Oral)   Ht 5\' 8"  (1.727 m)   Wt 204 lb (92.5 kg)   SpO2 95%   BMI 31.02 kg/m   Wt Readings from Last 3 Encounters:  04/02/20 204 lb (92.5 kg)  03/05/20 206 lb 12.8 oz (93.8 kg)  02/05/20 205 lb (93 kg)    Physical Exam Vitals and nursing note reviewed.  Constitutional:      General: He is not in acute distress.    Appearance: Normal appearance. He is not ill-appearing, toxic-appearing or diaphoretic.  HENT:     Head: Normocephalic and atraumatic.     Right Ear: External ear normal.     Left Ear: External ear normal.     Nose: Nose normal.     Mouth/Throat:     Mouth: Mucous membranes are moist.     Pharynx: Oropharynx is clear.  Eyes:     General: No scleral icterus.       Right eye: No discharge.        Left eye: No discharge.     Extraocular Movements: Extraocular movements intact.     Conjunctiva/sclera: Conjunctivae normal.     Pupils: Pupils are equal, round, and reactive to light.  Cardiovascular:     Rate and Rhythm: Normal rate and regular rhythm.     Pulses: Normal pulses.     Heart sounds: Normal heart sounds. No murmur heard.  No friction rub. No gallop.   Pulmonary:     Effort: Pulmonary effort is normal. No respiratory distress.     Breath sounds: Normal breath sounds. No stridor. No wheezing, rhonchi or rales.  Chest:     Chest wall: No tenderness.  Musculoskeletal:        General: Normal range of motion.     Cervical back: Normal range of motion and neck supple.  Skin:    General: Skin is warm and dry.     Capillary Refill: Capillary refill takes less than 2 seconds.     Coloration: Skin is not jaundiced or pale.     Findings: No bruising, erythema, lesion or rash.  Neurological:     General: No focal deficit present.     Mental Status: He is alert and oriented to  person, place, and time. Mental status is at baseline.  Psychiatric:        Mood and Affect: Mood normal.        Behavior: Behavior normal.        Thought Content: Thought content normal.        Judgment: Judgment normal.     Results for orders placed or performed during the hospital encounter of 02/05/20  Hemoglobin A1c  Result Value Ref Range   Hgb A1c MFr Bld 6.0 (H) 4.8 - 5.6 %   Mean Plasma Glucose 126 mg/dL  Basic metabolic panel  Result Value Ref Range   Sodium 138 135 - 145 mmol/L   Potassium 4.7 3.5 - 5.1 mmol/L   Chloride 103 98 - 111 mmol/L   CO2 27 22 - 32 mmol/L   Glucose, Bld 109 (H) 70 - 99 mg/dL   BUN 16 6 - 20 mg/dL   Creatinine, Ser 1.16 0.61 - 1.24 mg/dL   Calcium 9.1 8.9 - 10.3 mg/dL   GFR calc non Af Amer >60 >60 mL/min   GFR calc Af Amer >60 >60 mL/min   Anion gap 8 5 - 15      Assessment & Plan:   Problem List Items Addressed This Visit      Cardiovascular and Mediastinum   Hypertension associated with diabetes (HCC)    BP running well, but didn't take his losartan today. Unable to cut his losartan. Will stop losartan and start metoprolol to help with migraines. Aware that he will not be on a ACE/ARB, but risks of syncope outweigh the benefits of renal protection.       Relevant Medications   metoprolol succinate (TOPROL-XL) 25 MG 24 hr tablet   Migraines    Unclear if migraines or tension headaches. Will start metoprolol and continue to monitor. If not better, consider MRI given changes in migraines.       Relevant Medications   metoprolol succinate (TOPROL-XL) 25 MG 24 hr tablet    Other Visit Diagnoses    Acute non intractable tension-type headache    -  Primary   Unclear if migraines or tension headaches. Will start metoprolol and continue to monitor. If not better, consider MRI given changes in migraines.    Relevant Medications   metoprolol succinate (TOPROL-XL) 25 MG 24 hr tablet       Follow up plan: Return in about 4 weeks  (around 04/30/2020).

## 2020-04-02 NOTE — Assessment & Plan Note (Signed)
Unclear if migraines or tension headaches. Will start metoprolol and continue to monitor. If not better, consider MRI given changes in migraines.

## 2020-05-05 ENCOUNTER — Ambulatory Visit (INDEPENDENT_AMBULATORY_CARE_PROVIDER_SITE_OTHER): Payer: 59 | Admitting: Family Medicine

## 2020-05-05 ENCOUNTER — Other Ambulatory Visit: Payer: Self-pay

## 2020-05-05 ENCOUNTER — Other Ambulatory Visit
Admission: RE | Admit: 2020-05-05 | Discharge: 2020-05-05 | Disposition: A | Discharge status: Home / Self Care | Payer: 59 | Attending: Family Medicine | Admitting: Family Medicine

## 2020-05-05 ENCOUNTER — Encounter: Payer: Self-pay | Admitting: Family Medicine

## 2020-05-05 VITALS — BP 132/88 | HR 87 | Temp 98.1°F

## 2020-05-05 DIAGNOSIS — E1169 Type 2 diabetes mellitus with other specified complication: Secondary | ICD-10-CM | POA: Insufficient documentation

## 2020-05-05 DIAGNOSIS — E782 Mixed hyperlipidemia: Secondary | ICD-10-CM | POA: Insufficient documentation

## 2020-05-05 DIAGNOSIS — G43109 Migraine with aura, not intractable, without status migrainosus: Secondary | ICD-10-CM | POA: Diagnosis not present

## 2020-05-05 DIAGNOSIS — Z79899 Other long term (current) drug therapy: Secondary | ICD-10-CM | POA: Insufficient documentation

## 2020-05-05 DIAGNOSIS — E1159 Type 2 diabetes mellitus with other circulatory complications: Secondary | ICD-10-CM | POA: Diagnosis not present

## 2020-05-05 DIAGNOSIS — E119 Type 2 diabetes mellitus without complications: Secondary | ICD-10-CM | POA: Diagnosis not present

## 2020-05-05 DIAGNOSIS — I152 Hypertension secondary to endocrine disorders: Secondary | ICD-10-CM | POA: Diagnosis not present

## 2020-05-05 DIAGNOSIS — J069 Acute upper respiratory infection, unspecified: Secondary | ICD-10-CM

## 2020-05-05 DIAGNOSIS — Z7984 Long term (current) use of oral hypoglycemic drugs: Secondary | ICD-10-CM | POA: Diagnosis not present

## 2020-05-05 DIAGNOSIS — E785 Hyperlipidemia, unspecified: Secondary | ICD-10-CM

## 2020-05-05 LAB — LIPID PANEL
Cholesterol: 124 mg/dL (ref 0–200)
HDL: 34 mg/dL — ABNORMAL LOW (ref >40)
LDL Cholesterol: 61 mg/dL (ref 0–99)
Total CHOL/HDL Ratio: 3.6 RATIO
Triglycerides: 147 mg/dL (ref <150)
VLDL: 29 mg/dL (ref 0–40)

## 2020-05-05 LAB — COMPREHENSIVE METABOLIC PANEL
ALT: 44 U/L (ref 0–44)
AST: 29 U/L (ref 15–41)
Albumin: 4.6 g/dL (ref 3.5–5.0)
Alkaline Phosphatase: 110 U/L (ref 38–126)
Anion gap: 10 (ref 5–15)
BUN: 10 mg/dL (ref 6–20)
CO2: 27 mmol/L (ref 22–32)
Calcium: 9 mg/dL (ref 8.9–10.3)
Chloride: 101 mmol/L (ref 98–111)
Creatinine, Ser: 1.02 mg/dL (ref 0.61–1.24)
GFR, Estimated: >60 mL/min (ref >60)
Glucose, Bld: 142 mg/dL — ABNORMAL HIGH (ref 70–99)
Potassium: 4.1 mmol/L (ref 3.5–5.1)
Sodium: 138 mmol/L (ref 135–145)
Total Bilirubin: 1.6 mg/dL — ABNORMAL HIGH (ref 0.3–1.2)
Total Protein: 8.1 g/dL (ref 6.5–8.1)

## 2020-05-05 LAB — HEMOGLOBIN A1C
Hgb A1c MFr Bld: 5.8 % — ABNORMAL HIGH (ref 4.8–5.6)
Mean Plasma Glucose: 120 mg/dL

## 2020-05-05 MED ORDER — ROSUVASTATIN CALCIUM 10 MG PO TABS: 10.0000 mg | ORAL_TABLET | Freq: Every day | ORAL | 1 refills | Status: DC

## 2020-05-05 MED ORDER — RIZATRIPTAN BENZOATE 10 MG PO TABS: 10.0000 mg | ORAL_TABLET | Freq: Two times a day (BID) | ORAL | 4 refills | Status: DC | PRN

## 2020-05-05 MED ORDER — METFORMIN HCL 500 MG PO TABS: 500.0000 mg | ORAL_TABLET | Freq: Every day | ORAL | 1 refills | Status: DC

## 2020-05-05 NOTE — Assessment & Plan Note (Signed)
Doing better. Continue current regimen. Call with any concerns. Continue to monitor.

## 2020-05-05 NOTE — Assessment & Plan Note (Signed)
Under good control on current regimen. Continue current regimen. Continue to monitor. Call with any concerns. Refills given. Labs drawn today.   

## 2020-05-05 NOTE — Progress Notes (Signed)
BP 132/88   Pulse 87   Temp 98.1 F (36.7 C) (Oral)   SpO2 97%    Subjective:    Patient ID: Keith Barber, male    DOB: 26-Jul-1970, 49 y.o.   MRN: [id number]  HPI: Keith Barber is a 49 y.o. male  Chief Complaint  Patient presents with  . Headache    pt states he has had a headache since about Thursday last week, states it would get better and then start hurting again throughout the weekend  . Cough    pt states he started having a slight cough and fatigue Thursday as well.   UPPER RESPIRATORY TRACT INFECTION- went to visit his parents who were smokers and has been having a cough since then  Duration: 5 days Worst symptom: cough Fever: no, + chills and sweats Cough: yes Shortness of breath: no Wheezing: no Chest pain: no Chest tightness: no Chest congestion: no Nasal congestion: yes Runny nose: yes Post nasal drip: yes Sneezing: yes Sore throat: yes Swollen glands: no Sinus pressure: yes Headache: yes Face pain: yes Toothache: no Ear pain: yes bilateral Ear pressure: yes bilateral Eyes red/itching:no Eye drainage/crusting: no  Vomiting: no Rash: no Fatigue: yes Sick contacts: yes Strep contacts: no  Context: stable Recurrent sinusitis: no Relief with OTC cold/cough medications: no  Treatments attempted: none   HYPERTENSION / HYPERLIPIDEMIA Satisfied with current treatment? yes Duration of hypertension: chronic BP monitoring frequency: not checking BP medication side effects: no Past BP meds: metoprolol Duration of hyperlipidemia: chronic Cholesterol medication side effects: no Cholesterol supplements: none Past cholesterol medications: crestor Medication compliance: excellent compliance Aspirin: no Recent stressors: no Recurrent headaches: yes Visual changes: no Palpitations: no Dyspnea: no Chest pain: no Lower extremity edema: no Dizzy/lightheaded: no  DIABETES Hypoglycemic episodes:no Polydipsia/polyuria: no Visual  disturbance: no Chest pain: no Paresthesias: no Glucose Monitoring: no  Accucheck frequency: Not Checking Taking Insulin?: no Blood Pressure Monitoring: not checking Retinal Examination: Not up to Date Foot Exam: Not up to Date Diabetic Education: Completed Pneumovax: Up to Date Influenza: Up to Date Aspirin: no  Feeling well on the metoprolol. Has taken his maxalt about 6x in the last month or so, has been having less migraines since the last time he was here. Has been having migraines about 1x a week.   Relevant past medical, surgical, family and social history reviewed and updated as indicated. Interim medical history since our last visit reviewed. Allergies and medications reviewed and updated.  Review of Systems  Constitutional: Negative.   HENT: Positive for congestion, postnasal drip, rhinorrhea, sinus pressure, sneezing and sore throat. Negative for dental problem, drooling, ear discharge, ear pain, facial swelling, hearing loss, mouth sores, nosebleeds, sinus pain, tinnitus, trouble swallowing and voice change.   Respiratory: Positive for cough. Negative for apnea, choking, chest tightness, shortness of breath, wheezing and stridor.   Cardiovascular: Negative.   Gastrointestinal: Negative.   Musculoskeletal: Negative.   Neurological: Positive for headaches. Negative for dizziness, tremors, seizures, syncope, facial asymmetry, speech difficulty, weakness, light-headedness and numbness.  Psychiatric/Behavioral: Negative.     Per HPI unless specifically indicated above     Objective:    BP 132/88   Pulse 87   Temp 98.1 F (36.7 C) (Oral)   SpO2 97%   Wt Readings from Last 3 Encounters:  04/02/20 204 lb (92.5 kg)  03/05/20 206 lb 12.8 oz (93.8 kg)  02/05/20 205 lb (93 kg)    Physical Exam Vitals and nursing note reviewed.  Constitutional:      General: He is not in acute distress.    Appearance: Normal appearance. He is not ill-appearing, toxic-appearing or  diaphoretic.  HENT:     Head: Normocephalic and atraumatic.     Right Ear: External ear normal.     Left Ear: External ear normal.     Nose: Nose normal.     Mouth/Throat:     Mouth: Mucous membranes are moist.     Pharynx: Oropharynx is clear.  Eyes:     General: No scleral icterus.       Right eye: No discharge.        Left eye: No discharge.     Extraocular Movements: Extraocular movements intact.     Conjunctiva/sclera: Conjunctivae normal.     Pupils: Pupils are equal, round, and reactive to light.  Cardiovascular:     Rate and Rhythm: Normal rate and regular rhythm.     Pulses: Normal pulses.     Heart sounds: Normal heart sounds. No murmur heard.  No friction rub. No gallop.   Pulmonary:     Effort: Pulmonary effort is normal. No respiratory distress.     Breath sounds: Normal breath sounds. No stridor. No wheezing, rhonchi or rales.  Chest:     Chest wall: No tenderness.  Musculoskeletal:        General: Normal range of motion.     Cervical back: Normal range of motion and neck supple.  Skin:    General: Skin is warm and dry.     Capillary Refill: Capillary refill takes less than 2 seconds.     Coloration: Skin is not jaundiced or pale.     Findings: No bruising, erythema, lesion or rash.  Neurological:     General: No focal deficit present.     Mental Status: He is alert and oriented to person, place, and time. Mental status is at baseline.  Psychiatric:        Mood and Affect: Mood normal.        Behavior: Behavior normal.        Thought Content: Thought content normal.        Judgment: Judgment normal.     Results for orders placed or performed during the hospital encounter of 02/05/20  Hemoglobin A1c  Result Value Ref Range   Hgb A1c MFr Bld 6.0 (H) 4.8 - 5.6 %   Mean Plasma Glucose 126 mg/dL  Basic metabolic panel  Result Value Ref Range   Sodium 138 135 - 145 mmol/L   Potassium 4.7 3.5 - 5.1 mmol/L   Chloride 103 98 - 111 mmol/L   CO2 27 22 - 32  mmol/L   Glucose, Bld 109 (H) 70 - 99 mg/dL   BUN 16 6 - 20 mg/dL   Creatinine, Ser 1.16 0.61 - 1.24 mg/dL   Calcium 9.1 8.9 - 10.3 mg/dL   GFR calc non Af Amer >60 >60 mL/min   GFR calc Af Amer >60 >60 mL/min   Anion gap 8 5 - 15      Assessment & Plan:   Problem List Items Addressed This Visit      Cardiovascular and Mediastinum   Hypertension associated with diabetes ([place])    Under good control on current regimen. Continue current regimen. Continue to monitor. Call with any concerns. Refills given. Labs drawn today.       Relevant Medications   rosuvastatin (CRESTOR) 10 MG tablet   metFORMIN (GLUCOPHAGE) 500 MG tablet  Other Relevant Orders   Comprehensive metabolic panel   Urine Microalbumin w/creat. ratio   Migraines    Doing better. Continue current regimen. Call with any concerns. Continue to monitor.       Relevant Medications   rosuvastatin (CRESTOR) 10 MG tablet   rizatriptan (MAXALT) 10 MG tablet     Endocrine   Hyperlipidemia associated with type 2 diabetes mellitus ([place])    Under good control on current regimen. Continue current regimen. Continue to monitor. Call with any concerns. Refills given. Labs drawn today.       Relevant Medications   rosuvastatin (CRESTOR) 10 MG tablet   metFORMIN (GLUCOPHAGE) 500 MG tablet   Type 2 diabetes mellitus without complication, without long-term current use of insulin ([facility])    Under good control on current regimen. Continue current regimen. Continue to monitor. Call with any concerns. Refills given. Labs drawn today.       Relevant Medications   rosuvastatin (CRESTOR) 10 MG tablet   metFORMIN (GLUCOPHAGE) 500 MG tablet   Other Relevant Orders   Comprehensive metabolic panel   Urine Microalbumin w/creat. ratio   HgB A1c    Other Visit Diagnoses    Viral upper respiratory tract infection    -  Primary   Will swab for COVID. Self-quarantine until results are back. Continue to monitor. Call if not getting better  or getting worse.    Relevant Orders   Novel Coronavirus, NAA ([facility])   Mixed hyperlipidemia       Stable. Continue to monitor. Call with any concerns. Continue to monitor.     Relevant Medications   rosuvastatin (CRESTOR) 10 MG tablet   Other Relevant Orders   Comprehensive metabolic panel   Lipid Profile       Follow up plan: Return in about 3 months (around 08/05/2020).

## 2020-05-06 LAB — NOVEL CORONAVIRUS, NAA: SARS-CoV-2, NAA: NOT DETECTED

## 2020-05-06 LAB — MICROALBUMIN / CREATININE URINE RATIO
Creatinine, Urine: 309 mg/dL
Microalb Creat Ratio: 20 mg/g creat (ref 0–29)
Microalb, Ur: 61.3 ug/mL — ABNORMAL HIGH

## 2020-05-06 LAB — SARS-COV-2, NAA 2 DAY TAT

## 2020-06-27 ENCOUNTER — Other Ambulatory Visit
Admission: RE | Admit: 2020-06-27 | Discharge: 2020-06-27 | Disposition: A | Discharge status: Home / Self Care | Payer: 59 | Source: Ambulatory Visit | Attending: Family Medicine | Admitting: Family Medicine

## 2020-06-27 ENCOUNTER — Encounter: Payer: Self-pay | Admitting: Family Medicine

## 2020-06-27 ENCOUNTER — Other Ambulatory Visit: Payer: Self-pay

## 2020-06-27 ENCOUNTER — Ambulatory Visit: Payer: Self-pay

## 2020-06-27 ENCOUNTER — Ambulatory Visit (INDEPENDENT_AMBULATORY_CARE_PROVIDER_SITE_OTHER): Payer: 59 | Admitting: Family Medicine

## 2020-06-27 ENCOUNTER — Ambulatory Visit: Payer: Self-pay | Admitting: *Deleted

## 2020-06-27 VITALS — BP 133/91 | HR 85 | Temp 97.7°F | Wt 198.8 lb

## 2020-06-27 DIAGNOSIS — R42 Dizziness and giddiness: Secondary | ICD-10-CM

## 2020-06-27 LAB — CBC WITH DIFFERENTIAL/PLATELET
Abs Immature Granulocytes: 0.01 10*3/uL (ref 0.00–0.07)
Basophils Absolute: 0 10*3/uL (ref 0.0–0.1)
Basophils Relative: 1 %
Eosinophils Absolute: 0.2 10*3/uL (ref 0.0–0.5)
Eosinophils Relative: 2 %
HCT: 53.7 % — ABNORMAL HIGH (ref 39.0–52.0)
Hemoglobin: 18.3 g/dL — ABNORMAL HIGH (ref 13.0–17.0)
Immature Granulocytes: 0 %
Lymphocytes Relative: 32 %
Lymphs Abs: 2.4 10*3/uL (ref 0.7–4.0)
MCH: 29.5 pg (ref 26.0–34.0)
MCHC: 34.1 g/dL (ref 30.0–36.0)
MCV: 86.5 fL (ref 80.0–100.0)
Monocytes Absolute: 0.8 10*3/uL (ref 0.1–1.0)
Monocytes Relative: 10 %
Neutro Abs: 4.2 10*3/uL (ref 1.7–7.7)
Neutrophils Relative %: 55 %
Platelets: 177 10*3/uL (ref 150–400)
RBC: 6.21 MIL/uL — ABNORMAL HIGH (ref 4.22–5.81)
RDW: 12.8 % (ref 11.5–15.5)
WBC: 7.5 10*3/uL (ref 4.0–10.5)
nRBC: 0 % (ref 0.0–0.2)

## 2020-06-27 LAB — COMPREHENSIVE METABOLIC PANEL
ALT: 36 U/L (ref 0–44)
AST: 27 U/L (ref 15–41)
Albumin: 4.3 g/dL (ref 3.5–5.0)
Alkaline Phosphatase: 99 U/L (ref 38–126)
Anion gap: 10 (ref 5–15)
BUN: 14 mg/dL (ref 6–20)
CO2: 28 mmol/L (ref 22–32)
Calcium: 9.3 mg/dL (ref 8.9–10.3)
Chloride: 102 mmol/L (ref 98–111)
Creatinine, Ser: 1.11 mg/dL (ref 0.61–1.24)
GFR, Estimated: >60 mL/min (ref >60)
Glucose, Bld: 126 mg/dL — ABNORMAL HIGH (ref 70–99)
Potassium: 4.4 mmol/L (ref 3.5–5.1)
Sodium: 140 mmol/L (ref 135–145)
Total Bilirubin: 1.2 mg/dL (ref 0.3–1.2)
Total Protein: 7.8 g/dL (ref 6.5–8.1)

## 2020-06-27 LAB — TSH: TSH: 1.566 u[IU]/mL (ref 0.350–4.500)

## 2020-06-27 LAB — MAGNESIUM: Magnesium: 2.3 mg/dL (ref 1.7–2.4)

## 2020-06-27 MED ORDER — MECLIZINE HCL 25 MG PO CHEW: 1.0000 | CHEWABLE_TABLET | Freq: Three times a day (TID) | ORAL | 1 refills | Status: DC | PRN

## 2020-06-27 NOTE — Progress Notes (Signed)
BP (!) 133/91   Pulse 85   Temp 97.7 F (36.5 C)   Wt 198 lb 12.8 oz (90.2 kg)   SpO2 95%   BMI 30.23 kg/m    Subjective:    Patient ID: Keith Barber, male    DOB: [birth year], 49 y.o.   MRN: [id number]  HPI: Keith Barber is a 49 y.o. male  Chief Complaint  Patient presents with  . Dizziness    Pt states for 3 days he has been having dizziness and nausea. Pt states he feels like the room is spinning. Pt states he feels dizzy when he makes sudden movements   . Fatigue    Pt states for a few weeks he has been feeling very tired every day even when he gets enough sleep    DIZZINESS Duration: 3 days Description of symptoms: room spinning Duration of episode: 10-15 seconds Dizziness frequency: recurrent, several times a day Provoking factors: laying down, standing up quickly, bending over Triggered by rolling over in bed: yes Triggered by bending over: yes Aggravated by head movement: no Aggravated by exertion, coughing, loud noises: no Recent head injury: no Recent or current viral symptoms: no History of vasovagal episodes: no Nausea: yes Vomiting: no Tinnitus: no Hearing loss: no Aural fullness: no Headache: yes Photophobia/phonophobia: no Unsteady gait: no Postural instability: no Diplopia, dysarthria, dysphagia or weakness: no Related to exertion: no Pallor: no Diaphoresis: no Dyspnea: no Chest pain: no  Relevant past medical, surgical, family and social history reviewed and updated as indicated. Interim medical history since our last visit reviewed. Allergies and medications reviewed and updated.  Review of Systems  Constitutional: Negative.   Respiratory: Negative.   Cardiovascular: Negative.   Gastrointestinal: Negative.   Musculoskeletal: Negative.   Neurological: Positive for dizziness. Negative for tremors, seizures, syncope, facial asymmetry, speech difficulty, weakness, light-headedness, numbness and headaches.   Psychiatric/Behavioral: Negative.     Per HPI unless specifically indicated above     Objective:    BP (!) 133/91   Pulse 85   Temp 97.7 F (36.5 C)   Wt 198 lb 12.8 oz (90.2 kg)   SpO2 95%   BMI 30.23 kg/m   Wt Readings from Last 3 Encounters:  06/27/20 198 lb 12.8 oz (90.2 kg)  04/02/20 204 lb (92.5 kg)  03/05/20 206 lb 12.8 oz (93.8 kg)    Orthostatic VS for the past 24 hrs (Last 3 readings):  BP- Lying Pulse- Lying BP- Sitting Pulse- Sitting BP- Standing at 0 minutes Pulse- Standing at 0 minutes BP- Standing at 3 minutes Pulse- Standing at 3 minutes  06/27/20 1006 (!) 151/104 76 (!) 133/91 85 (!) 153/97 81 (!) 149/94 86    Physical Exam Vitals and nursing note reviewed.  Constitutional:      General: He is not in acute distress.    Appearance: Normal appearance. He is not ill-appearing, toxic-appearing or diaphoretic.  HENT:     Head: Normocephalic and atraumatic.     Right Ear: Tympanic membrane, ear canal and external ear normal.     Left Ear: Tympanic membrane, ear canal and external ear normal.     Nose: Nose normal.     Mouth/Throat:     Mouth: Mucous membranes are moist.     Pharynx: Oropharynx is clear.  Eyes:     General: No scleral icterus.       Right eye: No discharge.        Left eye: No discharge.  Extraocular Movements: Extraocular movements intact.     Right eye: Nystagmus present.     Left eye: Nystagmus present.     Conjunctiva/sclera: Conjunctivae normal.     Pupils: Pupils are equal, round, and reactive to light.     Comments: Upwards going nystagmus  Cardiovascular:     Rate and Rhythm: Normal rate and regular rhythm.     Pulses: Normal pulses.     Heart sounds: Normal heart sounds. No murmur heard. No friction rub. No gallop.   Pulmonary:     Effort: Pulmonary effort is normal. No respiratory distress.     Breath sounds: Normal breath sounds. No stridor. No wheezing, rhonchi or rales.  Chest:     Chest wall: No tenderness.   Musculoskeletal:        General: Normal range of motion.     Cervical back: Normal range of motion and neck supple.  Skin:    General: Skin is warm and dry.     Capillary Refill: Capillary refill takes less than 2 seconds.     Coloration: Skin is not jaundiced or pale.     Findings: No bruising, erythema, lesion or rash.  Neurological:     General: No focal deficit present.     Mental Status: He is alert and oriented to person, place, and time. Mental status is at baseline.  Psychiatric:        Mood and Affect: Mood normal.        Behavior: Behavior normal.        Thought Content: Thought content normal.        Judgment: Judgment normal.     Results for orders placed or performed during the hospital encounter of 05/05/20  Comprehensive metabolic panel  Result Value Ref Range   Sodium 138 135 - 145 mmol/L   Potassium 4.1 3.5 - 5.1 mmol/L   Chloride 101 98 - 111 mmol/L   CO2 27 22 - 32 mmol/L   Glucose, Bld 142 (H) 70 - 99 mg/dL   BUN 10 6 - 20 mg/dL   Creatinine, Ser 1.02 0.61 - 1.24 mg/dL   Calcium 9.0 8.9 - 10.3 mg/dL   Total Protein 8.1 6.5 - 8.1 g/dL   Albumin 4.6 3.5 - 5.0 g/dL   AST 29 15 - 41 U/L   ALT 44 0 - 44 U/L   Alkaline Phosphatase 110 38 - 126 U/L   Total Bilirubin 1.6 (H) 0.3 - 1.2 mg/dL   GFR, Estimated >60 >60 mL/min   Anion gap 10 5 - 15  Microalbumin / creatinine urine ratio  Result Value Ref Range   Microalb, Ur 61.3 (H) Not Estab. ug/mL   Microalb Creat Ratio 20 0 - 29 mg/g creat   Creatinine, Urine 309.0 Not Estab. mg/dL  Hemoglobin A1c  Result Value Ref Range   Hgb A1c MFr Bld 5.8 (H) 4.8 - 5.6 %   Mean Plasma Glucose 120 mg/dL  Lipid panel  Result Value Ref Range   Cholesterol 124 0 - 200 mg/dL   Triglycerides 147 <150 mg/dL   HDL 34 (L) >40 mg/dL   Total CHOL/HDL Ratio 3.6 RATIO   VLDL 29 0 - 40 mg/dL   LDL Cholesterol 61 0 - 99 mg/dL      Assessment & Plan:   Problem List Items Addressed This Visit   None   Visit Diagnoses     Vertigo    -  Primary   Will check labs. Start meclizine and  epley's manuver. Call if not getting better or getting worse. Warning signs to go to ER discussed today.   Relevant Orders   CBC with Differential/Platelet   Comprehensive metabolic panel   TSH   Magnesium       Follow up plan: Return in about 4 weeks (around 07/25/2020).

## 2020-06-27 NOTE — Patient Instructions (Signed)

## 2020-06-27 NOTE — Telephone Encounter (Signed)
Patient calling with dizziness- call disconnected during transfer- attempted to call patient  Back twice- call would not go through

## 2020-06-27 NOTE — Telephone Encounter (Signed)
Pt. Reports he started having dizziness 3 days ago. Worse when he stands and rolls over in bed - has vertigo. Has had a headache with this was well. Appointment made for today.  Reason for Disposition . [1] MODERATE dizziness (e.g., vertigo; feels very unsteady, interferes with normal activities) AND [2] has NOT been evaluated by physician for this  Answer Assessment - Initial Assessment Questions 1. DESCRIPTION: "Describe your dizziness."     Dizzy 2. VERTIGO: "Do you feel like either you or the room is spinning or tilting?"      Yes 3. LIGHTHEADED: "Do you feel lightheaded?" (e.g., somewhat faint, woozy, weak upon standing)     Yes 4. SEVERITY: "How bad is it?"  "Can you walk?"   - MILD: Feels unsteady but walking normally.   - MODERATE: Feels very unsteady when walking, but not falling; interferes with normal activities (e.g., school, work) .   - SEVERE: Unable to walk without falling, or requires assistance to walk without falling.     Moderate 5. ONSET:  "When did the dizziness begin?"     3 days ago 6. AGGRAVATING FACTORS: "Does anything make it worse?" (e.g., standing, change in head position)     Standing 7. CAUSE: "What do you think is causing the dizziness?"     Unsure 8. RECURRENT SYMPTOM: "Have you had dizziness before?" If Yes, ask: "When was the last time?" "What happened that time?"     No 9. OTHER SYMPTOMS: "Do you have any other symptoms?" (e.g., headache, weakness, numbness, vomiting, earache)     Headache 10. PREGNANCY: "Is there any chance you are pregnant?" "When was your last menstrual period?"       n/a  Protocols used: DIZZINESS - VERTIGO-A-AH

## 2020-06-27 NOTE — Telephone Encounter (Signed)
See triage note other nurse

## 2020-07-09 DIAGNOSIS — G5632 Lesion of radial nerve, left upper limb: Secondary | ICD-10-CM

## 2020-07-09 DIAGNOSIS — M79642 Pain in left hand: Secondary | ICD-10-CM | POA: Insufficient documentation

## 2020-07-09 DIAGNOSIS — M7712 Lateral epicondylitis, left elbow: Secondary | ICD-10-CM

## 2020-07-09 DIAGNOSIS — S60222A Contusion of left hand, initial encounter: Secondary | ICD-10-CM | POA: Insufficient documentation

## 2020-07-09 HISTORY — DX: Contusion of left hand, initial encounter: S60.222A

## 2020-07-09 HISTORY — DX: Lesion of radial nerve, left upper limb: G56.32

## 2020-07-09 HISTORY — DX: Pain in left hand: M79.642

## 2020-07-09 HISTORY — DX: Lateral epicondylitis, left elbow: M77.12

## 2020-08-06 ENCOUNTER — Ambulatory Visit: Payer: 59 | Admitting: Family Medicine

## 2020-08-06 ENCOUNTER — Encounter: Payer: Self-pay | Admitting: Family Medicine

## 2020-08-06 ENCOUNTER — Other Ambulatory Visit
Admission: RE | Admit: 2020-08-06 | Discharge: 2020-08-06 | Disposition: A | Discharge status: Home / Self Care | Payer: 59 | Source: Ambulatory Visit | Attending: Family Medicine | Admitting: Family Medicine

## 2020-08-06 ENCOUNTER — Telehealth (INDEPENDENT_AMBULATORY_CARE_PROVIDER_SITE_OTHER): Payer: 59 | Admitting: Family Medicine

## 2020-08-06 ENCOUNTER — Other Ambulatory Visit: Payer: Self-pay | Admitting: Family Medicine

## 2020-08-06 VITALS — BP 146/111 | HR 85

## 2020-08-06 DIAGNOSIS — I152 Hypertension secondary to endocrine disorders: Secondary | ICD-10-CM | POA: Insufficient documentation

## 2020-08-06 DIAGNOSIS — E119 Type 2 diabetes mellitus without complications: Secondary | ICD-10-CM

## 2020-08-06 DIAGNOSIS — E782 Mixed hyperlipidemia: Secondary | ICD-10-CM | POA: Diagnosis present

## 2020-08-06 DIAGNOSIS — E1159 Type 2 diabetes mellitus with other circulatory complications: Secondary | ICD-10-CM | POA: Insufficient documentation

## 2020-08-06 LAB — COMPREHENSIVE METABOLIC PANEL
ALT: 42 U/L (ref 0–44)
AST: 32 U/L (ref 15–41)
Albumin: 4.3 g/dL (ref 3.5–5.0)
Alkaline Phosphatase: 93 U/L (ref 38–126)
Anion gap: 8 (ref 5–15)
BUN: 12 mg/dL (ref 6–20)
CO2: 27 mmol/L (ref 22–32)
Calcium: 9 mg/dL (ref 8.9–10.3)
Chloride: 104 mmol/L (ref 98–111)
Creatinine, Ser: 0.94 mg/dL (ref 0.61–1.24)
GFR, Estimated: >60 mL/min (ref >60)
Glucose, Bld: 132 mg/dL — ABNORMAL HIGH (ref 70–99)
Potassium: 4 mmol/L (ref 3.5–5.1)
Sodium: 139 mmol/L (ref 135–145)
Total Bilirubin: 1.3 mg/dL — ABNORMAL HIGH (ref 0.3–1.2)
Total Protein: 7.9 g/dL (ref 6.5–8.1)

## 2020-08-06 LAB — LIPID PANEL
Cholesterol: 139 mg/dL (ref 0–200)
HDL: 36 mg/dL — ABNORMAL LOW (ref >40)
LDL Cholesterol: 73 mg/dL (ref 0–99)
Total CHOL/HDL Ratio: 3.9 RATIO
Triglycerides: 148 mg/dL (ref <150)
VLDL: 30 mg/dL (ref 0–40)

## 2020-08-06 LAB — HEMOGLOBIN A1C
Hgb A1c MFr Bld: 5.8 % — ABNORMAL HIGH (ref 4.8–5.6)
Mean Plasma Glucose: 119.76 mg/dL

## 2020-08-06 MED ORDER — LOSARTAN POTASSIUM 25 MG PO TABS: 12.5000 mg | ORAL_TABLET | Freq: Every day | ORAL | 1 refills | Status: DC

## 2020-08-06 NOTE — Addendum Note (Signed)
Addended by: Lattie Haw on: 08/06/2020 10:39 AM   Modules accepted: Orders

## 2020-08-06 NOTE — Addendum Note (Signed)
Addended by: Peityn Payton J on: 08/06/2020 10:40 AM   Modules accepted: Orders  

## 2020-08-06 NOTE — Addendum Note (Signed)
Addended by: Abigayle Wilinski J on: 08/06/2020 10:39 AM   Modules accepted: Orders  

## 2020-08-06 NOTE — Addendum Note (Signed)
Addended by: Lattie Haw on: 08/06/2020 10:40 AM   Modules accepted: Orders

## 2020-08-06 NOTE — Addendum Note (Signed)
Addended by: Lendy Dittrich J on: 08/06/2020 10:39 AM   Modules accepted: Orders  

## 2020-08-06 NOTE — Progress Notes (Signed)
Keith Barber, your A1c is beautiful! Go ahead and stop your metformin and we'll recheck in 3 months.

## 2020-08-06 NOTE — Assessment & Plan Note (Addendum)
Still running high. Will continue metoprolol and add 12.5mg  losartan. Recheck 1 month. Call with any concerns.

## 2020-08-06 NOTE — Progress Notes (Signed)
There were no vitals taken for this visit.   Subjective:    Patient ID: Keith Barber, male    DOB: 03/11/1971, 50 y.o.   MRN: [id number]  HPI: Keith Barber is a 50 y.o. male  Chief Complaint  Patient presents with  . Diabetes   DIABETES Hypoglycemic episodes:no Polydipsia/polyuria: yes Visual disturbance: no Chest pain: no Paresthesias: no Glucose Monitoring: no  Accucheck frequency: Not Checking Taking Insulin?: no Blood Pressure Monitoring: a few times a month Retinal Examination: Not up to Date Foot Exam: Not up to Date Diabetic Education: Completed Pneumovax: Up to Date Influenza: Not up to Date Aspirin: no  HYPERTENSION Hypertension status: uncontrolled  Satisfied with current treatment? yes Duration of hypertension: chronic BP monitoring frequency:  a few times a week BP medication side effects:  no Medication compliance: excellent compliance Previous BP meds:amlodipine, lisinopril, losartan, metoprolol Aspirin: no Recurrent headaches: no Visual changes: no Palpitations: no Dyspnea: no Chest pain: no Lower extremity edema: no Dizzy/lightheaded: no   Relevant past medical, surgical, family and social history reviewed and updated as indicated. Interim medical history since our last visit reviewed. Allergies and medications reviewed and updated.  Review of Systems  Constitutional: Negative.   Respiratory: Negative.   Cardiovascular: Negative.   Gastrointestinal: Negative.   Neurological: Negative.   Psychiatric/Behavioral: Negative.     Per HPI unless specifically indicated above     Objective:    There were no vitals taken for this visit.  Wt Readings from Last 3 Encounters:  06/27/20 198 lb 12.8 oz (90.2 kg)  04/02/20 204 lb (92.5 kg)  03/05/20 206 lb 12.8 oz (93.8 kg)    Physical Exam Vitals and nursing note reviewed.  Constitutional:      General: He is not in acute distress.    Appearance: Normal appearance. He is not  ill-appearing, toxic-appearing or diaphoretic.  HENT:     Head: Normocephalic and atraumatic.     Right Ear: External ear normal.     Left Ear: External ear normal.     Nose: Nose normal.     Mouth/Throat:     Mouth: Mucous membranes are moist.     Pharynx: Oropharynx is clear.  Eyes:     General: No scleral icterus.       Right eye: No discharge.        Left eye: No discharge.     Conjunctiva/sclera: Conjunctivae normal.     Pupils: Pupils are equal, round, and reactive to light.  Pulmonary:     Effort: Pulmonary effort is normal. No respiratory distress.     Comments: Speaking in full sentences Musculoskeletal:        General: Normal range of motion.     Cervical back: Normal range of motion.  Skin:    Coloration: Skin is not jaundiced or pale.     Findings: No bruising, erythema, lesion or rash.  Neurological:     Mental Status: He is alert and oriented to person, place, and time. Mental status is at baseline.  Psychiatric:        Mood and Affect: Mood normal.        Behavior: Behavior normal.        Thought Content: Thought content normal.        Judgment: Judgment normal.     Results for orders placed or performed during the hospital encounter of 06/27/20  Magnesium  Result Value Ref Range   Magnesium 2.3 1.7 - 2.4 mg/dL  TSH  Result Value Ref Range   TSH 1.566 0.350 - 4.500 uIU/mL  Comprehensive metabolic panel  Result Value Ref Range   Sodium 140 135 - 145 mmol/L   Potassium 4.4 3.5 - 5.1 mmol/L   Chloride 102 98 - 111 mmol/L   CO2 28 22 - 32 mmol/L   Glucose, Bld 126 (H) 70 - 99 mg/dL   BUN 14 6 - 20 mg/dL   Creatinine, Ser 1.11 0.61 - 1.24 mg/dL   Calcium 9.3 8.9 - 10.3 mg/dL   Total Protein 7.8 6.5 - 8.1 g/dL   Albumin 4.3 3.5 - 5.0 g/dL   AST 27 15 - 41 U/L   ALT 36 0 - 44 U/L   Alkaline Phosphatase 99 38 - 126 U/L   Total Bilirubin 1.2 0.3 - 1.2 mg/dL   GFR, Estimated >60 >60 mL/min   Anion gap 10 5 - 15  CBC with Differential/Platelet  Result  Value Ref Range   WBC 7.5 4.0 - 10.5 K/uL   RBC 6.21 (H) 4.22 - 5.81 MIL/uL   Hemoglobin 18.3 (H) 13.0 - 17.0 g/dL   HCT 53.7 (H) 39.0 - 52.0 %   MCV 86.5 80.0 - 100.0 fL   MCH 29.5 26.0 - 34.0 pg   MCHC 34.1 30.0 - 36.0 g/dL   RDW 12.8 11.5 - 15.5 %   Platelets 177 150 - 400 K/uL   nRBC 0.0 0.0 - 0.2 %   Neutrophils Relative % 55 %   Neutro Abs 4.2 1.7 - 7.7 K/uL   Lymphocytes Relative 32 %   Lymphs Abs 2.4 0.7 - 4.0 K/uL   Monocytes Relative 10 %   Monocytes Absolute 0.8 0.1 - 1.0 K/uL   Eosinophils Relative 2 %   Eosinophils Absolute 0.2 0.0 - 0.5 K/uL   Basophils Relative 1 %   Basophils Absolute 0.0 0.0 - 0.1 K/uL   Immature Granulocytes 0 %   Abs Immature Granulocytes 0.01 0.00 - 0.07 K/uL      Assessment & Plan:   Problem List Items Addressed This Visit   None      Follow up plan: No follow-ups on file.    . This visit was completed via MyChart due to the restrictions of the COVID-19 pandemic. All issues as above were discussed and addressed. Physical exam was done as above through visual confirmation on MyChart. If it was felt that the patient should be evaluated in the office, they were directed there. The patient verbally consented to this visit. . Location of the patient: home . Location of the provider: home . Those involved with this call:  . Provider: Park Liter, DO . CMA: Yvonna Alanis, [place] . Front Desk/Registration: Jill Side  . Time spent on call: 25 minutes with patient face to face via video conference. More than 50% of this time was spent in counseling and coordination of care. 40 minutes total spent in review of patient's record and preparation of their chart.

## 2020-08-06 NOTE — Assessment & Plan Note (Signed)
Will get A1c checked- if below 6 will stop metformin, if not, continue metformin and recheck 3 months. Call with any concerns.

## 2020-08-07 LAB — HM DIABETES EYE EXAM

## 2020-08-07 LAB — MICROALBUMIN / CREATININE URINE RATIO
Creatinine, Urine: 115.7 mg/dL
Microalb Creat Ratio: 41 mg/g creat — ABNORMAL HIGH (ref 0–29)
Microalb, Ur: 47.2 ug/mL — ABNORMAL HIGH

## 2020-09-17 ENCOUNTER — Ambulatory Visit (INDEPENDENT_AMBULATORY_CARE_PROVIDER_SITE_OTHER): Payer: 59 | Admitting: Nurse Practitioner

## 2020-09-17 ENCOUNTER — Other Ambulatory Visit: Payer: Self-pay

## 2020-09-17 ENCOUNTER — Encounter: Payer: Self-pay | Admitting: Nurse Practitioner

## 2020-09-17 VITALS — BP 156/119 | HR 84 | Temp 98.1°F | Wt 201.5 lb

## 2020-09-17 DIAGNOSIS — L600 Ingrowing nail: Secondary | ICD-10-CM

## 2020-09-17 MED ORDER — SULFAMETHOXAZOLE-TRIMETHOPRIM 800-160 MG PO TABS: 1.0000 | ORAL_TABLET | Freq: Two times a day (BID) | ORAL | 0 refills | Status: AC

## 2020-09-17 NOTE — Progress Notes (Signed)
BP (!) 156/119   Pulse 84   Temp 98.1 F (36.7 C)   Wt 201 lb 8 oz (91.4 kg)   SpO2 97%   BMI 30.64 kg/m    Subjective:    Patient ID: Keith Barber, male    DOB: 07-Jan-1971, 50 y.o.   MRN: [id number]  HPI: Keith Barber is a 50 y.o. male  Chief Complaint  Patient presents with  . Nail Problem    Patient states that it started back in 2019, was treated for a fungus, now it looks infected Rt big toe.  Middle toe nail has fell off   TOE PAIN Duration: months Involved toe: both big toe and 2nd  Mechanism of injury: unknown Onset: gradual Severity: moderate  Quality: throbbing Frequency: intermittent Radiation: no Aggravating factors: walking  Alleviating factors: OTC antifungal, squeezed pus.  Status: worse Treatments attempted: OTC antifungal cream  Relief with NSAIDs?: no Morning stiffness: no Redness: no  Bruising: yes Swelling: no Paresthesias / decreased sensation: no Fevers: no  Relevant past medical, surgical, family and social history reviewed and updated as indicated. Interim medical history since our last visit reviewed. Allergies and medications reviewed and updated.  Review of Systems  Constitutional: Negative for fever.  Musculoskeletal:       Redness, pain and pus from great toe and 2nd toe on right foot.    Per HPI unless specifically indicated above     Objective:    BP (!) 156/119   Pulse 84   Temp 98.1 F (36.7 C)   Wt 201 lb 8 oz (91.4 kg)   SpO2 97%   BMI 30.64 kg/m   Wt Readings from Last 3 Encounters:  09/17/20 201 lb 8 oz (91.4 kg)  06/27/20 198 lb 12.8 oz (90.2 kg)  04/02/20 204 lb (92.5 kg)    Physical Exam Vitals and nursing note reviewed.  Constitutional:      General: He is not in acute distress.    Appearance: Normal appearance. He is not ill-appearing, toxic-appearing or diaphoretic.  HENT:     Head: Normocephalic.     Right Ear: External ear normal.     Left Ear: External ear normal.     Nose:  Nose normal. No congestion or rhinorrhea.     Mouth/Throat:     Mouth: Mucous membranes are moist.  Eyes:     General:        Right eye: No discharge.        Left eye: No discharge.     Extraocular Movements: Extraocular movements intact.     Conjunctiva/sclera: Conjunctivae normal.     Pupils: Pupils are equal, round, and reactive to light.  Cardiovascular:     Rate and Rhythm: Normal rate and regular rhythm.     Heart sounds: No murmur heard.   Pulmonary:     Effort: Pulmonary effort is normal. No respiratory distress.     Breath sounds: Normal breath sounds. No wheezing, rhonchi or rales.  Abdominal:     General: Abdomen is flat. Bowel sounds are normal.  Musculoskeletal:     Cervical back: Normal range of motion and neck supple.  Feet:     Right foot:     Toenail Condition: Right toenails are abnormally thick and ingrown. Fungal disease present. Skin:    General: Skin is warm and dry.     Capillary Refill: Capillary refill takes less than 2 seconds.  Neurological:     General: No focal deficit present.  Mental Status: He is alert and oriented to person, place, and time.  Psychiatric:        Mood and Affect: Mood normal.        Behavior: Behavior normal.        Thought Content: Thought content normal.        Judgment: Judgment normal.     Results for orders placed or performed in visit on 08/14/20  HM DIABETES EYE EXAM  Result Value Ref Range   HM Diabetic Eye Exam No Retinopathy No Retinopathy      Assessment & Plan:   Problem List Items Addressed This Visit   None   Visit Diagnoses    Ingrown toenail of right foot    -  Primary   Complete course of antibiotics. RTC on Monday for toenail removal.  Consider referral to podiatry due to thicked toenail likely related to fungus.   Relevant Medications   sulfamethoxazole-trimethoprim (BACTRIM DS) 800-160 MG tablet       Follow up plan: Return for Toe nail removal with Dr. Lenna Sciara on Monday.   A total of 20  minutes were spent on this encounter today.  When total time is documented, this includes both the face-to-face and non-face-to-face time personally spent before, during and after the visit on the date of the encounter.

## 2020-09-22 ENCOUNTER — Ambulatory Visit (INDEPENDENT_AMBULATORY_CARE_PROVIDER_SITE_OTHER): Payer: 59 | Admitting: Family Medicine

## 2020-09-22 ENCOUNTER — Encounter: Payer: Self-pay | Admitting: Family Medicine

## 2020-09-22 ENCOUNTER — Other Ambulatory Visit: Payer: Self-pay

## 2020-09-22 VITALS — BP 129/81 | HR 81 | Temp 98.1°F | Wt 201.0 lb

## 2020-09-22 DIAGNOSIS — L600 Ingrowing nail: Secondary | ICD-10-CM

## 2020-09-22 NOTE — Progress Notes (Signed)
BP 129/81   Pulse 81   Temp 98.1 F (36.7 C)   Wt 201 lb (91.2 kg)   BMI 30.56 kg/m    Subjective:    Patient ID: Keith Barber, male    DOB: 1970-12-15, 50 y.o.   MRN: [id number]  HPI: Keith Barber is a 50 y.o. male  Chief Complaint  Patient presents with  . toe nail removal   TOE PAIN Duration: couple of weeks Involved toe: rightbig toe  Mechanism of injury: unknown Onset: sudden Severity: moderate  Quality: aching Frequency: constant Radiation: no Aggravating factors: tight shoes, constant   Alleviating factors: bactrim   Status: better Treatments attempted:bactrim   Relief with NSAIDs?: mild Morning stiffness: no Redness: no  Bruising: no Swelling: no Paresthesias / decreased sensation: no Fevers: no  Relevant past medical, surgical, family and social history reviewed and updated as indicated. Interim medical history since our last visit reviewed. Allergies and medications reviewed and updated.  Review of Systems  Constitutional: Negative.   Respiratory: Negative.   Cardiovascular: Negative.   Gastrointestinal: Negative.   Musculoskeletal: Negative.   Psychiatric/Behavioral: Negative.     Per HPI unless specifically indicated above     Objective:    BP 129/81   Pulse 81   Temp 98.1 F (36.7 C)   Wt 201 lb (91.2 kg)   BMI 30.56 kg/m   Wt Readings from Last 3 Encounters:  09/22/20 201 lb (91.2 kg)  09/17/20 201 lb 8 oz (91.4 kg)  06/27/20 198 lb 12.8 oz (90.2 kg)    Physical Exam Vitals and nursing note reviewed.  Constitutional:      General: He is not in acute distress.    Appearance: Normal appearance. He is not ill-appearing, toxic-appearing or diaphoretic.  HENT:     Head: Normocephalic and atraumatic.     Right Ear: External ear normal.     Left Ear: External ear normal.     Nose: Nose normal.     Mouth/Throat:     Mouth: Mucous membranes are moist.     Pharynx: Oropharynx is clear.  Eyes:     General: No  scleral icterus.       Right eye: No discharge.        Left eye: No discharge.     Extraocular Movements: Extraocular movements intact.     Conjunctiva/sclera: Conjunctivae normal.     Pupils: Pupils are equal, round, and reactive to light.  Cardiovascular:     Rate and Rhythm: Normal rate and regular rhythm.     Pulses: Normal pulses.     Heart sounds: Normal heart sounds. No murmur heard. No friction rub. No gallop.   Pulmonary:     Effort: Pulmonary effort is normal. No respiratory distress.     Breath sounds: Normal breath sounds. No stridor. No wheezing, rhonchi or rales.  Chest:     Chest wall: No tenderness.  Musculoskeletal:        General: Normal range of motion.     Cervical back: Normal range of motion and neck supple.  Skin:    General: Skin is warm and dry.     Capillary Refill: Capillary refill takes less than 2 seconds.     Coloration: Skin is not jaundiced or pale.     Findings: No bruising, erythema, lesion or rash.     Comments: Ingrown nail on medial R great toe  Neurological:     General: No focal deficit present.  Mental Status: He is alert and oriented to person, place, and time. Mental status is at baseline.  Psychiatric:        Mood and Affect: Mood normal.        Behavior: Behavior normal.        Thought Content: Thought content normal.        Judgment: Judgment normal.     Results for orders placed or performed in visit on 08/14/20  HM DIABETES EYE EXAM  Result Value Ref Range   HM Diabetic Eye Exam No Retinopathy No Retinopathy      Assessment & Plan:   Problem List Items Addressed This Visit   None   Visit Diagnoses    Ingrown toenail of right foot    -  Primary   Nail removed today with good results as below.       Procedure: Partial Toenail removal with Matrix Diagnosis:    ICD-10-CM   1. Ingrown toenail of right foot  L60.0    Nail removed today with good results as below.    Physican: Park Liter, DO Consent: Risks,  benefits, and alternative treatments discussed and all questions were answered.  Patient elected to proceed and verbal consent obtained Description:  Area prepped and draped using  sterile technique. Digital block of the  Right  1st toe performed by injecting local anesthetic at the base of the toe at the 2 oclock and 10 oclock positions, using 7 cc's of  1% lidocaine plain. After confirming adequate anesthesia, medial nail folds and epinychia were freed up using periosteal elevator.  Using scissors the nail was vertically cut beyond the epinychia to the base.  A hemostat was then used to remove the nail fragment. The nail was grasped using a hemostat and the nail was removed intact. Silver nitrate as applied to the nail matrix x 3.   Bacitracin ointment was applied to the operative site a circumferential gauze dressive was applied.  The patient tolerated the procedure well.  Complications: none Estimated Blood Loss: <1cc Post Procedure Instructions: The patient was encouraged to keep the dressing in place for 24 hours and keep the foot elevated as much as possible during this time.  After the first day they are instructed to soak the toe in warm water 3 times daily for 3-4 days.  Antibiotic ointment is to be applied daily for 1 week.  The patient was informed that some oozing is to be expected for 1-2 weeks but that they should return immediately for pus, increased pain or redness.  They were instructed to take APAP or motrin as needed for post operative discomfort.    Follow up plan: Return if symptoms worsen or fail to improve.

## 2020-09-22 NOTE — Patient Instructions (Signed)
Fingernail or Toenail Removal, Adult, Care After This sheet gives you information about how to care for yourself after your procedure. Your health care provider may also give you more specific instructions. If you have problems or questions, contact your health care provider. What can I expect after the procedure? After the procedure, it is common to have:  Pain.  Redness.  Swelling.  Soreness. Follow these instructions at home: Medicines  Take over-the-counter and prescription medicines only as told by your health care provider.  If you were prescribed an antibiotic medicine, take or apply it as told by your health care provider. Do not stop using the antibiotic even if you start to feel better. Wound care  Follow instructions from your health care provider about how to take care of your wound. Make sure you: ? Wash your hands with soap and water for at least 20 seconds before and after you change your bandage (dressing). If soap and water are not available, use hand sanitizer. ? Change your dressing as told by your health care provider. ? Keep your dressing dry until your health care provider says it can be removed.  Check your wound every day for signs of infection. Check for: ? More redness, swelling, or pain. ? More fluid or blood. ? Warmth. ? Pus or a bad smell. If you have a splint:  Wear the splint as told by your health care provider. Remove it only as told by your health care provider.  Loosen the splint if your fingers tingle, become numb, or turn cold and blue.  Keep the splint clean.  If the splint is not waterproof: ? Do not let it get wet. ? Cover it with a watertight covering when you take a bath or a shower.   Managing pain, stiffness, and swelling  Move your fingers or toes often to reduce stiffness and swelling.  Raise (elevate) the injured area above the level of your heart while you are sitting or lying down. You may need to keep your hand or foot  raised or supported on a pillow for 24 hours or as told by your health care provider. General instructions  If you were given a shoe to wear, wear it as told by your health care provider.  Keep all follow-up visits as told by your health care provider. This is important. Contact a health care provider if:  You have more redness, swelling, or pain around your wound.  You have more fluid or blood coming from your wound.  You have pus or a bad smell coming from your wound.  Your wound feels warm to the touch.  You have a fever. Get help right away if:  Your finger or toe looks pale, blue, or black.  You are not able to move your finger or toe. Summary  After the procedure, it is common to have pain and swelling.  Keep the hand or foot raised (elevated) or supported on a pillow as told by your health care provider.  Take over-the-counter and prescription medicines only as told by your health care provider.  Check your wound every day for signs of infection. This information is not intended to replace advice given to you by your health care provider. Make sure you discuss any questions you have with your health care provider. Document Revised: 02/26/2019 Document Reviewed: 02/26/2019 Elsevier Patient Education  [place].

## 2020-09-26 ENCOUNTER — Encounter: Payer: Self-pay | Admitting: Nurse Practitioner

## 2020-09-26 ENCOUNTER — Other Ambulatory Visit: Payer: Self-pay

## 2020-09-26 ENCOUNTER — Ambulatory Visit (INDEPENDENT_AMBULATORY_CARE_PROVIDER_SITE_OTHER): Payer: 59 | Admitting: Nurse Practitioner

## 2020-09-26 VITALS — BP 138/94 | HR 79 | Temp 97.9°F | Ht 68.0 in | Wt 201.0 lb

## 2020-09-26 DIAGNOSIS — B351 Tinea unguium: Secondary | ICD-10-CM | POA: Diagnosis not present

## 2020-09-26 DIAGNOSIS — L600 Ingrowing nail: Secondary | ICD-10-CM

## 2020-09-26 NOTE — Progress Notes (Signed)
BP (!) 138/94   Pulse 79   Temp 97.9 F (36.6 C) (Oral)   Ht 5\' 8"  (1.727 m)   Wt 201 lb (91.2 kg)   SpO2 97%   BMI 30.56 kg/m    Subjective:    Patient ID: Keith Barber, male    DOB: 1971/07/09, 50 y.o.   MRN: [id number]  HPI: Keith Barber is a 50 y.o. male  Chief Complaint  Patient presents with  . right great toe    Has been bleeding and oozing since procedure 09/22/2020 with Dr. Lenna Sciara   Patient presents to clinic with complaints of toe "oozing" since his toe nail removal at the beginning of the week.  Patient is concerned that toe is infected.  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  Denies fever, and red streaks going up the leg.  Pain has improved.    Relevant past medical, surgical, family and social history reviewed and updated as indicated. Interim medical history since our last visit reviewed. Allergies and medications reviewed and updated.  Review of Systems  Skin: Positive for wound.       Oozing and blood from area of toenail that was removed.     Per HPI unless specifically indicated above     Objective:    BP (!) 138/94   Pulse 79   Temp 97.9 F (36.6 C) (Oral)   Ht 5\' 8"  (1.727 m)   Wt 201 lb (91.2 kg)   SpO2 97%   BMI 30.56 kg/m   Wt Readings from Last 3 Encounters:  09/26/20 201 lb (91.2 kg)  09/22/20 201 lb (91.2 kg)  09/17/20 201 lb 8 oz (91.4 kg)    Physical Exam Vitals and nursing note reviewed.  Constitutional:      General: He is not in acute distress.    Appearance: Normal appearance. He is not ill-appearing, toxic-appearing or diaphoretic.  HENT:     Head: Normocephalic.     Right Ear: External ear normal.     Left Ear: External ear normal.     Nose: Nose normal. No congestion or rhinorrhea.     Mouth/Throat:     Mouth: Mucous membranes are moist.  Eyes:     General:        Right eye: No discharge.        Left eye: No discharge.     Extraocular Movements:  Extraocular movements intact.     Conjunctiva/sclera: Conjunctivae normal.     Pupils: Pupils are equal, round, and reactive to light.  Cardiovascular:     Rate and Rhythm: Normal rate and regular rhythm.     Heart sounds: No murmur heard.   Pulmonary:     Effort: Pulmonary effort is normal. No respiratory distress.     Breath sounds: Normal breath sounds. No wheezing, rhonchi or rales.  Abdominal:     General: Abdomen is flat. Bowel sounds are normal.  Musculoskeletal:     Cervical back: Normal range of motion and neck supple.       Feet:  Feet:     Right foot:     Toenail Condition: Fungal disease present. Skin:    General: Skin is warm and dry.     Capillary Refill: Capillary refill takes less than 2 seconds.  Neurological:     General: No focal deficit present.     Mental Status: He is alert and oriented to person,  place, and time.  Psychiatric:        Mood and Affect: Mood normal.        Behavior: Behavior normal.        Thought Content: Thought content normal.        Judgment: Judgment normal.     Results for orders placed or performed in visit on 08/14/20  HM DIABETES EYE EXAM  Result Value Ref Range   HM Diabetic Eye Exam No Retinopathy No Retinopathy      Assessment & Plan:   Problem List Items Addressed This Visit   None   Visit Diagnoses    Ingrown toenail of right foot    -  Primary   Removed earlier this week. S/S of infection reviewed with patient in office today.  Reassured patient that toe is healing well and continue to leave it OTA.    Toenail fungus       Relevant Orders   Ambulatory referral to Podiatry       Follow up plan: Return if symptoms worsen or fail to improve.  A total of 20 minutes were spent on this encounter today.  When total time is documented, this includes both the face-to-face and non-face-to-face time personally spent before, during and after the visit on the date of the encounter.

## 2020-09-29 ENCOUNTER — Encounter: Payer: Self-pay | Admitting: Podiatry

## 2020-09-29 ENCOUNTER — Ambulatory Visit (INDEPENDENT_AMBULATORY_CARE_PROVIDER_SITE_OTHER): Payer: 59 | Admitting: Podiatry

## 2020-09-29 ENCOUNTER — Other Ambulatory Visit: Payer: Self-pay

## 2020-09-29 DIAGNOSIS — L03031 Cellulitis of right toe: Secondary | ICD-10-CM | POA: Diagnosis not present

## 2020-09-29 DIAGNOSIS — L603 Nail dystrophy: Secondary | ICD-10-CM | POA: Diagnosis not present

## 2020-09-29 MED ORDER — NEOMYCIN-POLYMYXIN-HC 1 % OT SOLN: OTIC | 1 refills | Status: DC

## 2020-09-29 MED ORDER — DOXYCYCLINE HYCLATE 100 MG PO TABS: 100.0000 mg | ORAL_TABLET | Freq: Two times a day (BID) | ORAL | 0 refills | Status: DC

## 2020-09-29 NOTE — Patient Instructions (Addendum)

## 2020-09-29 NOTE — Progress Notes (Signed)
Subjective:  Patient ID: Keith Barber, male    DOB: Mar 18, 1971,  MRN: [id number] HPI Chief Complaint  Patient presents with  . Toe Pain    Hallux right - lateral border - toe was red and swollen from ingrown toenail, PCP rx'd antibiotics on 09/17/20, had ingrown procedure on 09/22/20 - "removed nail and cauterized", had checked Friday because was really red, swollen and sore, referred here for evaluation-completed antibiotics, wrapping in gauze, 2019 took oral med for fungus but never cleared up  . Diabetes    Last a1c was 5.8  . New Patient (Initial Visit)    50 y.o. male presents with the above complaint.   ROS: Denies fever chills nausea vomiting muscle aches pains calf pain back pain chest pain shortness of breath.  Past Medical History:  Diagnosis Date  . Bone spur    LEFT SHOULDER THAT MAKES HIS LEFT ARM TINGLE - had removed  . Diverticulitis large intestine 09/2017  . GERD (gastroesophageal reflux disease)   . Hemorrhoids   . Hyperlipidemia   . Hypertension   . Migraines    rare migraines   Past Surgical History:  Procedure Laterality Date  . [place] STUDY N/A 05/30/2018   Procedure: [place] STUDY;  Surgeon: Virgel Manifold, MD;  Location: [facility];  Service: Gastroenterology;  Laterality: N/A;  . COLONOSCOPY WITH PROPOFOL N/A 02/01/2018   Procedure: COLONOSCOPY WITH PROPOFOL;  Surgeon: Virgel Manifold, MD;  Location: [facility];  Service: Endoscopy;  Laterality: N/A;  . ESOPHAGEAL MANOMETRY N/A 05/30/2018   Procedure: ESOPHAGEAL MANOMETRY (EM);  Surgeon: Virgel Manifold, MD;  Location: [facility];  Service: Gastroenterology;  Laterality: N/A;  . ESOPHAGOGASTRODUODENOSCOPY (EGD) WITH PROPOFOL N/A 02/01/2018   Procedure: ESOPHAGOGASTRODUODENOSCOPY (EGD) WITH PROPOFOL;  Surgeon: Virgel Manifold, MD;  Location: [facility];  Service: Endoscopy;  Laterality: N/A;  . INGUINAL HERNIA REPAIR Bilateral 01/21/2017   Procedure:  LAPAROSCOPIC BILATERAL INGUINAL HERNIA REPAIR;  Surgeon: Jules Husbands, MD;  Location: [facility];  Service: General;  Laterality: Bilateral;  . KNEE SURGERY Left   . SHOULDER ARTHROSCOPY WITH DISTAL CLAVICLE RESECTION Left 07/05/2017   Procedure: LEFT SHOULDER ARTHROSCOPY, SUBACROMIAL DECOMPRESSION, DISTAL CLAVICLE RESECTION, POSSIBLE MINI OPEN ROTATOR CUFF REPAIR;  Surgeon: [name], MD;  Location: [place];  Service: Orthopedics;  Laterality: Left;  . SHOULDER CLOSED REDUCTION Left 12/13/2017   Procedure: LEFT SHOULDER MANIPULATION with steroid injection;  Surgeon: [name], MD;  Location: [place];  Service: Orthopedics;  Laterality: Left;  . SHOULDER SURGERY Right   . UMBILICAL HERNIA REPAIR N/A 01/21/2017   Procedure: HERNIA REPAIR UMBILICAL ADULT;  Surgeon: Jules Husbands, MD;  Location: [facility];  Service: General;  Laterality: N/A;    Current Outpatient Medications:  .  doxycycline (VIBRA-TABS) 100 MG tablet, Take 1 tablet (100 mg total) by mouth 2 (two) times daily., Disp: 20 tablet, Rfl: 0 .  NEOMYCIN-POLYMYXIN-HYDROCORTISONE (CORTISPORIN) 1 % SOLN OTIC solution, Apply 1-2 drops to toe BID after soaking, Disp: 10 mL, Rfl: 1 .  diclofenac Sodium (VOLTAREN) 1 % GEL, Apply 2 g topically 4 (four) times daily., Disp: , Rfl:  .  loperamide (IMODIUM) 2 MG capsule, Take 2-4 mg by mouth as needed for diarrhea or loose stools., Disp: , Rfl:  .  losartan (COZAAR) 25 MG tablet, Take 0.5 tablets (12.5 mg total) by mouth daily. (Patient not taking: Reported on 09/26/2020), Disp: 45 tablet, Rfl: 1 .  meloxicam (MOBIC) 15 MG tablet, Take  15 mg by mouth daily., Disp: , Rfl:  .  metoprolol succinate (TOPROL-XL) 25 MG 24 hr tablet, Take 1 tablet (25 mg total) by mouth daily., Disp: 90 tablet, Rfl: 3 .  NAPROXEN SODIUM PO, Take by mouth 2 (two) times daily. , Disp: , Rfl:  .  oxyCODONE (OXY IR/ROXICODONE) 5 MG immediate release tablet, Take 1-2 tabs PO q4-6h prn post-op pain, Disp: , Rfl:  .   rizatriptan (MAXALT) 10 MG tablet, Take 1 tablet (10 mg total) by mouth 2 (two) times daily as needed., Disp: 10 tablet, Rfl: 4 .  rosuvastatin (CRESTOR) 10 MG tablet, Take 1 tablet (10 mg total) by mouth daily., Disp: 90 tablet, Rfl: 1  Allergies  Allergen Reactions  . Amlodipine Itching   Review of Systems Objective:  There were no vitals filed for this visit.  General: Well developed, nourished, in no acute distress, alert and oriented x3   Dermatological: Skin is warm, dry and supple bilateral. Nails x 10 are well maintained; remaining integument appears unremarkable at this time. There are no open sores, no preulcerative lesions, no rash or signs of infection present.  Vascular: Dorsalis Pedis artery and Posterior Tibial artery pedal pulses are 2/4 bilateral with immedate capillary fill time. Pedal hair growth present. No varicosities and no lower extremity edema present bilateral.   Neruologic: Grossly intact via light touch bilateral. Vibratory intact via tuning fork bilateral. Protective threshold with Semmes Wienstein monofilament intact to all pedal sites bilateral. Patellar and Achilles deep tendon reflexes 2+ bilateral. No Babinski or clonus noted bilateral.   Musculoskeletal: No gross boney pedal deformities bilateral. No pain, crepitus, or limitation noted with foot and ankle range of motion bilateral. Muscular strength 5/5 in all groups tested bilateral.  Gait: Unassisted, Nonantalgic.    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  Plan: Total nail avulsion hallux right with I&D.  Tolerated procedure well after local anesthetic.  Wrote a prescription for Cortisporin Otic as well as doxycycline and I will follow-up with him in 2 weeks he was given both oral and home-going instruction for the care and soaking of the foot.  Also took the nail to send for pathologic evaluation.     Keith Barber,  [place]

## 2020-10-06 ENCOUNTER — Other Ambulatory Visit: Payer: Self-pay

## 2020-10-06 ENCOUNTER — Encounter: Payer: Self-pay | Admitting: Podiatry

## 2020-10-06 ENCOUNTER — Ambulatory Visit (INDEPENDENT_AMBULATORY_CARE_PROVIDER_SITE_OTHER): Payer: 59 | Admitting: Podiatry

## 2020-10-06 DIAGNOSIS — L03031 Cellulitis of right toe: Secondary | ICD-10-CM | POA: Diagnosis not present

## 2020-10-06 DIAGNOSIS — Z9889 Other specified postprocedural states: Secondary | ICD-10-CM

## 2020-10-06 MED ORDER — SILVER SULFADIAZINE 1 % EX CREA: 1.0000 "application " | TOPICAL_CREAM | Freq: Every day | CUTANEOUS | 0 refills | Status: DC

## 2020-10-06 NOTE — Progress Notes (Signed)
  Subjective:  Patient ID: Keith Barber, male    DOB: 04/19/1971,  MRN: [id number]  Chief Complaint  Patient presents with  . Ingrown Toenail    Patient returns today with concern for infection right hallux from total nail avulsion.  He says its very sore to touch and is concerned about the black spot on bottom of nail bed    50 y.o. male presents with the above complaint. History confirmed with patient.   Objective:  Physical Exam: warm, good capillary refill, no trophic changes or ulcerative lesions, normal DP and PT pulses and normal sensory exam.  Small area of dark material appears to be healing wound bed, suspect this is secondary to silver nitrate eschar from primary care doctor's office     Assessment:   1. Paronychia of great toe of right foot   2. Post-operative state      Plan:  Patient was evaluated and treated and all questions answered.  Reviewed the clinical exam findings with the patient discussed with him that I think this is likely secondary to what ever his primary used to cauterize the nailbed most likely silver nitrate to stop bleeding he had had when they removed part of the toenail.  I think should heal it uneventfully.  I prescribed him Silvadene ointment.  He will follow up with Dr. Milinda Pointer next week.  He has palpable pulses, is only in the prediabetic range and does not smoke.  Return in 9 days (on 10/15/2020) for with Dr Milinda Pointer.

## 2020-10-08 ENCOUNTER — Encounter: Payer: Self-pay | Admitting: *Deleted

## 2020-10-15 ENCOUNTER — Encounter: Payer: Self-pay | Admitting: Podiatry

## 2020-10-15 ENCOUNTER — Other Ambulatory Visit: Payer: Self-pay

## 2020-10-15 ENCOUNTER — Ambulatory Visit (INDEPENDENT_AMBULATORY_CARE_PROVIDER_SITE_OTHER): Payer: 59 | Admitting: Podiatry

## 2020-10-15 DIAGNOSIS — L03031 Cellulitis of right toe: Secondary | ICD-10-CM | POA: Diagnosis not present

## 2020-10-15 NOTE — Progress Notes (Signed)
He presents today for follow-up of his paronychia hallux right.  He states that over the past couple of days he has not been soaking it and he has not applied the Silvadene cream.  He states that he still has a little bit of drainage but all in all is feeling much better the redness is going down as well as the swelling.  Objective: Vital signs stable oriented x3 though still slightly swollen mildly bulbous on the lateral aspect of the hallux right there is a area of what appears to be silver nitrate in the proximal lateral border with some fibrin deposition.  There is some granulation tissue which is very minimal.  All in all this looks better than it did the last time I saw it.  Assessment: Slowly healing paronychia right.  Plan: I recommended that he continue to continue the use of the Silvadene cream a small amount every other day cover during the day but leave open at bedtime.  Follow-up with me in 2 weeks unless is completely healed.

## 2020-11-05 ENCOUNTER — Ambulatory Visit: Payer: 59 | Admitting: Podiatry

## 2020-11-24 ENCOUNTER — Encounter: Payer: Self-pay | Admitting: Family Medicine

## 2020-11-24 MED ORDER — ROSUVASTATIN CALCIUM 10 MG PO TABS: 10.0000 mg | ORAL_TABLET | Freq: Every day | ORAL | 1 refills | Status: DC

## 2021-01-06 ENCOUNTER — Encounter: Payer: Self-pay | Admitting: Nurse Practitioner

## 2021-01-06 ENCOUNTER — Ambulatory Visit (INDEPENDENT_AMBULATORY_CARE_PROVIDER_SITE_OTHER): Payer: 59 | Admitting: Nurse Practitioner

## 2021-01-06 ENCOUNTER — Other Ambulatory Visit
Admission: RE | Admit: 2021-01-06 | Discharge: 2021-01-06 | Disposition: A | Discharge status: Home / Self Care | Payer: 59 | Source: Ambulatory Visit | Attending: Nurse Practitioner | Admitting: Nurse Practitioner

## 2021-01-06 ENCOUNTER — Other Ambulatory Visit: Payer: Self-pay

## 2021-01-06 ENCOUNTER — Ambulatory Visit
Admission: RE | Admit: 2021-01-06 | Discharge: 2021-01-06 | Disposition: A | Discharge status: Home / Self Care | Payer: 59 | Source: Ambulatory Visit | Attending: Nurse Practitioner | Admitting: Nurse Practitioner

## 2021-01-06 VITALS — BP 155/104 | HR 84 | Temp 98.5°F | Wt 203.2 lb

## 2021-01-06 DIAGNOSIS — R197 Diarrhea, unspecified: Secondary | ICD-10-CM | POA: Insufficient documentation

## 2021-01-06 DIAGNOSIS — R1032 Left lower quadrant pain: Secondary | ICD-10-CM | POA: Insufficient documentation

## 2021-01-06 LAB — COMPREHENSIVE METABOLIC PANEL
ALT: 41 U/L (ref 0–44)
AST: 31 U/L (ref 15–41)
Albumin: 4.4 g/dL (ref 3.5–5.0)
Alkaline Phosphatase: 124 U/L (ref 38–126)
Anion gap: 6 (ref 5–15)
BUN: 13 mg/dL (ref 6–20)
CO2: 28 mmol/L (ref 22–32)
Calcium: 8.9 mg/dL (ref 8.9–10.3)
Chloride: 104 mmol/L (ref 98–111)
Creatinine, Ser: 1 mg/dL (ref 0.61–1.24)
GFR, Estimated: >60 mL/min (ref >60)
Glucose, Bld: 156 mg/dL — ABNORMAL HIGH (ref 70–99)
Potassium: 4.4 mmol/L (ref 3.5–5.1)
Sodium: 138 mmol/L (ref 135–145)
Total Bilirubin: 1.3 mg/dL — ABNORMAL HIGH (ref 0.3–1.2)
Total Protein: 7.9 g/dL (ref 6.5–8.1)

## 2021-01-06 LAB — CBC WITH DIFFERENTIAL/PLATELET
Abs Immature Granulocytes: 0.02 10*3/uL (ref 0.00–0.07)
Basophils Absolute: 0.1 10*3/uL (ref 0.0–0.1)
Basophils Relative: 1 %
Eosinophils Absolute: 0.1 10*3/uL (ref 0.0–0.5)
Eosinophils Relative: 2 %
HCT: 51.9 % (ref 39.0–52.0)
Hemoglobin: 18.1 g/dL — ABNORMAL HIGH (ref 13.0–17.0)
Immature Granulocytes: 0 %
Lymphocytes Relative: 31 %
Lymphs Abs: 2.6 10*3/uL (ref 0.7–4.0)
MCH: 29.8 pg (ref 26.0–34.0)
MCHC: 34.9 g/dL (ref 30.0–36.0)
MCV: 85.4 fL (ref 80.0–100.0)
Monocytes Absolute: 0.6 10*3/uL (ref 0.1–1.0)
Monocytes Relative: 7 %
Neutro Abs: 4.9 10*3/uL (ref 1.7–7.7)
Neutrophils Relative %: 59 %
Platelets: 192 10*3/uL (ref 150–400)
RBC: 6.08 MIL/uL — ABNORMAL HIGH (ref 4.22–5.81)
RDW: 12.2 % (ref 11.5–15.5)
WBC: 8.3 10*3/uL (ref 4.0–10.5)
nRBC: 0 % (ref 0.0–0.2)

## 2021-01-06 NOTE — Progress Notes (Signed)
Please let patient know that he does not have diverticulitis but he does have several diverticula that should be followed up on by GI. I recommend he follow up with his GI doctor as soon as possible.  Please let him know he still have fatty liver and it also shows aortic atherosclerosis.

## 2021-01-06 NOTE — Progress Notes (Signed)
BP (!) 155/104   Pulse 84   Temp 98.5 F (36.9 C)   Wt 203 lb 4 oz (92.2 kg)   SpO2 96%   BMI 30.90 kg/m    Subjective:    Patient ID: Keith Barber, male    DOB: 1971/05/24, 50 y.o.   MRN: [id number]  HPI: Benoit Meech is a 50 y.o. male  Chief Complaint  Patient presents with   Abdominal Pain   Dizziness    Patient states that he thinks that his bp is still dropping   ABDOMINAL ISSUES Duration:  off and on for a couple of weeks Petra Kuba: sharp pain Location: LUQ  Severity: 3/10  Radiation: yes down his leg Episode duration: Frequency: intermittent Alleviating factors: Aggravating factors: Treatments attempted: none Constipation: no Diarrhea:  a couple Episodes of diarrhea/day: a couple Mucous in the stool: no Heartburn: yes Bloating:yes Flatulence: yes Nausea: no Vomiting: no Episodes of vomit/day: Melena or hematochezia: no Rash: no Jaundice: no Fever: no Weight loss: no  Relevant past medical, surgical, family and social history reviewed and updated as indicated. Interim medical history since our last visit reviewed. Allergies and medications reviewed and updated.  Review of Systems  Gastrointestinal:  Positive for abdominal pain and diarrhea.  Neurological:  Positive for dizziness.   Per HPI unless specifically indicated above     Objective:    BP (!) 155/104   Pulse 84   Temp 98.5 F (36.9 C)   Wt 203 lb 4 oz (92.2 kg)   SpO2 96%   BMI 30.90 kg/m   Wt Readings from Last 3 Encounters:  01/06/21 203 lb 4 oz (92.2 kg)  09/26/20 201 lb (91.2 kg)  09/22/20 201 lb (91.2 kg)    Physical Exam Vitals and nursing note reviewed.  Constitutional:      General: He is not in acute distress.    Appearance: Normal appearance. He is not ill-appearing, toxic-appearing or diaphoretic.  HENT:     Head: Normocephalic.     Right Ear: External ear normal.     Left Ear: External ear normal.     Nose: Nose normal. No congestion or  rhinorrhea.     Mouth/Throat:     Mouth: Mucous membranes are moist.  Eyes:     General:        Right eye: No discharge.        Left eye: No discharge.     Extraocular Movements: Extraocular movements intact.     Conjunctiva/sclera: Conjunctivae normal.     Pupils: Pupils are equal, round, and reactive to light.  Cardiovascular:     Rate and Rhythm: Normal rate and regular rhythm.     Heart sounds: No murmur heard. Pulmonary:     Effort: Pulmonary effort is normal. No respiratory distress.     Breath sounds: Normal breath sounds. No wheezing, rhonchi or rales.  Abdominal:     General: Abdomen is flat. Bowel sounds are normal.     Tenderness: There is abdominal tenderness in the left upper quadrant and left lower quadrant. There is guarding. Negative signs include Murphy's sign and McBurney's sign.  Musculoskeletal:     Cervical back: Normal range of motion and neck supple.  Skin:    General: Skin is warm and dry.     Capillary Refill: Capillary refill takes less than 2 seconds.  Neurological:     General: No focal deficit present.     Mental Status: He is alert and oriented to  person, place, and time.  Psychiatric:        Mood and Affect: Mood normal.        Behavior: Behavior normal.        Thought Content: Thought content normal.        Judgment: Judgment normal.    Results for orders placed or performed in visit on 08/14/20  HM DIABETES EYE EXAM  Result Value Ref Range   HM Diabetic Eye Exam No Retinopathy No Retinopathy      Assessment & Plan:   Problem List Items Addressed This Visit   None Visit Diagnoses     Left lower quadrant abdominal pain    -  Primary   CBC,CMP and STAT CT ordered today. Will rule out diverticulitis. Will make recommendaitons based on imaging. Will likely refer to GI.   Relevant Orders   Comp Met (CMET)   CBC w/Diff   CT Abdomen Pelvis Wo Contrast (Completed)   Diarrhea, unspecified type       CBC,CMP and STAT CT ordered today. Will  rule out diverticulitis. Will make recommendaitons based on imaging. Will likely refer to GI.   Relevant Orders   Comp Met (CMET)   CBC w/Diff   CT Abdomen Pelvis Wo Contrast (Completed)        Follow up plan: Return if symptoms worsen or fail to improve.   A total of 30 minutes were spent on this encounter today.  When total time is documented, this includes both the face-to-face and non-face-to-face time personally spent before, during and after the visit on the date of the encounter.

## 2021-01-06 NOTE — Progress Notes (Signed)
Hi Trip.  Your lab work does not reveal a reason for your symptoms. I highly recommend you follow up with your GI doctor as soon as possible.

## 2021-01-07 ENCOUNTER — Encounter: Payer: Self-pay | Admitting: Gastroenterology

## 2021-01-07 ENCOUNTER — Ambulatory Visit (INDEPENDENT_AMBULATORY_CARE_PROVIDER_SITE_OTHER): Payer: 59 | Admitting: Gastroenterology

## 2021-01-07 VITALS — BP 158/84 | HR 93 | Temp 97.9°F | Wt 203.8 lb

## 2021-01-07 DIAGNOSIS — R1032 Left lower quadrant pain: Secondary | ICD-10-CM

## 2021-01-07 DIAGNOSIS — R195 Other fecal abnormalities: Secondary | ICD-10-CM

## 2021-01-07 MED ORDER — METAMUCIL 28.3 % PO POWD: 1.0000 | Freq: Every day | ORAL | 0 refills | Status: AC

## 2021-01-07 NOTE — Patient Instructions (Signed)
Use Ice packs to the area of pain 2-3 times a day Use an abdominal binder

## 2021-01-07 NOTE — Progress Notes (Signed)
[name], MD [address]  [place]  [place], [place] [address]  Main: [phone]  Fax: [phone]   Primary Care Physician: Jon Billings, NP   CC: Abdominal pain  HPI: Keith Barber is a 50 y.o. male presents with 1 to 2-week history of left lower quadrant abdominal pain, 3/10, sharp, nonradiating, intermittent.  Improves if he "rubs the area".  Pain is not present at this time and lasts about a few minutes.  No nausea or vomiting or weight loss.  Patient discussed his symptoms with his primary care provider yesterday.  I have reviewed their clinic note and they ordered CT scan for evaluation of diverticulitis.  CT scan did not show diverticulitis.  In addition, they ordered blood work which is reassuring.  No fever or chills.  Patient also reports loose stools over the last 2 months that he attributes to diet changes as he states he has been on the road a lot and having to eat fast food.  Has also been drinking diet sodas throughout the day.  Has started taking Imodium which is causing him to have some bowel movements that he has to strain.  No blood in stool.  Previous history: Manometry and pH testing was ordered but patient could not tolerate the pH catheter only manometry was done which showed a normal motility study.   EGD July 2019 showed normal esophagus, regular Z line at 39 cm, gastric erythema, normal duodenum.  Pathology negative for H. pylori. Colonoscopy July 2019 done for screening and history of diverticulitis, with one 3 mm rectosigmoid polyp removed, diverticulosis seen, internal hemorrhoids, otherwise normal.  Repeat recommended in 5 years.  Pathology showed hyperplastic polyp.   CT abdomen in April 2019 showed extensive hepatic steatosis.  Follow-up blood work in June 2019, with viral and autoimmune hepatitis work-up was negative.  Alk phos mildly chronically elevated, with most recent being from February 2020 around 120.  Patient  denies any pruritus.  GGT mildly elevated as well.   ROS: All ROS reviewed and negative except as per HPI   Past Medical History:  Diagnosis Date   Bone spur    LEFT SHOULDER THAT MAKES HIS LEFT ARM TINGLE - had removed   Diverticulitis large intestine 09/2017   GERD (gastroesophageal reflux disease)    Hemorrhoids    Hyperlipidemia    Hypertension    Migraines    rare migraines    Past Surgical History:  Procedure Laterality Date   75 HOUR [place] STUDY N/A 05/30/2018   Procedure: 24 HOUR [place] STUDY;  Surgeon: Virgel Manifold, MD;  Location: [facility];  Service: Gastroenterology;  Laterality: N/A;   COLONOSCOPY WITH PROPOFOL N/A 02/01/2018   Procedure: COLONOSCOPY WITH PROPOFOL;  Surgeon: Virgel Manifold, MD;  Location: [facility];  Service: Endoscopy;  Laterality: N/A;   ESOPHAGEAL MANOMETRY N/A 05/30/2018   Procedure: ESOPHAGEAL MANOMETRY (EM);  Surgeon: Virgel Manifold, MD;  Location: [facility];  Service: Gastroenterology;  Laterality: N/A;   ESOPHAGOGASTRODUODENOSCOPY (EGD) WITH PROPOFOL N/A 02/01/2018   Procedure: ESOPHAGOGASTRODUODENOSCOPY (EGD) WITH PROPOFOL;  Surgeon: Virgel Manifold, MD;  Location: [facility];  Service: Endoscopy;  Laterality: N/A;   INGUINAL HERNIA REPAIR Bilateral 01/21/2017   Procedure: LAPAROSCOPIC BILATERAL INGUINAL HERNIA REPAIR;  Surgeon: Jules Husbands, MD;  Location: [facility];  Service: General;  Laterality: Bilateral;   KNEE SURGERY Left    SHOULDER ARTHROSCOPY WITH DISTAL CLAVICLE RESECTION Left 07/05/2017   Procedure: LEFT SHOULDER ARTHROSCOPY, SUBACROMIAL DECOMPRESSION,  DISTAL CLAVICLE RESECTION, POSSIBLE MINI OPEN ROTATOR CUFF REPAIR;  Surgeon: [name], MD;  Location: [facility];  Service: Orthopedics;  Laterality: Left;   SHOULDER CLOSED REDUCTION Left 12/13/2017   Procedure: LEFT SHOULDER MANIPULATION with steroid injection;  Surgeon: [name], MD;  Location: [facility];  Service: Orthopedics;   Laterality: Left;   SHOULDER SURGERY Right    UMBILICAL HERNIA REPAIR N/A 01/21/2017   Procedure: HERNIA REPAIR UMBILICAL ADULT;  Surgeon: Jules Husbands, MD;  Location: [facility];  Service: General;  Laterality: N/A;    Prior to Admission medications   Medication Sig Start Date End Date Taking? Authorizing Provider  diclofenac Sodium (VOLTAREN) 1 % GEL Apply 2 g topically 4 (four) times daily. 07/09/20  Yes [provider]  loperamide (IMODIUM) 2 MG capsule Take 2-4 mg by mouth as needed for diarrhea or loose stools.   Yes [provider]  metoprolol succinate (TOPROL-XL) 25 MG 24 hr tablet Take 1 tablet (25 mg total) by mouth daily. 04/02/20  Yes Johnson, Megan P, DO  naproxen sodium (ALEVE) 220 MG tablet Take 220 mg by mouth.   Yes [provider]  Psyllium (METAMUCIL) 28.3 % POWD Take 1 Dose by mouth daily. 01/07/21 02/06/21 Yes Virgel Manifold, MD  rizatriptan (MAXALT) 10 MG tablet Take 1 tablet (10 mg total) by mouth 2 (two) times daily as needed. 05/05/20  Yes Johnson, Megan P, DO  rosuvastatin (CRESTOR) 10 MG tablet Take 1 tablet (10 mg total) by mouth daily. 11/24/20  Yes Jon Billings, NP    Family History  Problem Relation Age of Onset   Diabetes Mother    Migraines Mother    Cancer Neg Hx    COPD Neg Hx    Heart disease Neg Hx    Stroke Neg Hx      Social History   Tobacco Use   Smoking status: Former    Packs/day: 0.50    Years: 26.00    Pack years: 13.00    Types: Cigarettes    Quit date: 01/13/2017    Years since quitting: 3.9   Smokeless tobacco: Never   Tobacco comments:    PT STARTED TAKING ZYBAN ON 01-08-17 IN HOPES OF QUITTING  Vaping Use   Vaping Use: Never used  Substance Use Topics   Alcohol use: No   Drug use: No    Allergies as of 01/07/2021 - Review Complete 01/07/2021  Allergen Reaction Noted   Amlodipine Itching 08/06/2020    Physical Examination:   BP (!) 158/84 (BP Location: Left Arm, Patient Position:  Sitting, Cuff Size: Large)   Pulse 93   Temp 97.9 F (36.6 C) (Oral)   Wt 203 lb 12.8 oz (92.4 kg)   SpO2 96%   BMI 30.99 kg/m   Constitutional: General:   Alert,  Well-developed, well-nourished, pleasant and cooperative in NAD BP (!) 158/84 (BP Location: Left Arm, Patient Position: Sitting, Cuff Size: Large)   Pulse 93   Temp 97.9 F (36.6 C) (Oral)   Wt 203 lb 12.8 oz (92.4 kg)   SpO2 96%   BMI 30.99 kg/m   Eyes:  Sclera clear, no icterus.   Conjunctiva pink. PERRLA  Ears:  No scars, lesions or masses, Normal auditory acuity. Nose:  No deformity, discharge, or lesions. Mouth:  No deformity or lesions, oropharynx pink & moist.  Neck:  Supple; no masses or thyromegaly.  Respiratory: Normal respiratory effort, Normal percussion  Gastrointestinal:  Normal bowel sounds.  No  bruits.  Soft, tender to superficial 2 finger palpation in left lower quadrant, and non-distended without masses, hepatosplenomegaly or hernias noted.  No guarding or rebound tenderness.     Cardiac: No clubbing or edema.  No cyanosis. Normal posterior tibial pedal pulses noted.  Lymphatic:  No significant cervical or axillary adenopathy.  Psych:  Alert and cooperative. Normal mood and affect.  Musculoskeletal:  Normal gait. Head normocephalic, atraumatic. Symmetrical without gross deformities. 5/5 Upper and Lower extremity strength bilaterally.  Skin: Warm. Intact without significant lesions or rashes. No jaundice.  Neurologic:  Face symmetrical, tongue midline, Normal sensation to touch;  grossly normal neurologically.  Psych:  Alert and oriented x3, Alert and cooperative. Normal mood and affect.  Labs: CMP     Component Value Date/Time   NA 138 01/06/2021 1126   NA 140 08/30/2018 1441   K 4.4 01/06/2021 1126   CL 104 01/06/2021 1126   CO2 28 01/06/2021 1126   GLUCOSE 156 (H) 01/06/2021 1126   BUN 13 01/06/2021 1126   BUN 12 08/30/2018 1441   CREATININE 1.00 01/06/2021 1126   CALCIUM 8.9  01/06/2021 1126   PROT 7.9 01/06/2021 1126   PROT 7.6 10/24/2019 0951   ALBUMIN 4.4 01/06/2021 1126   ALBUMIN 4.7 10/24/2019 0951   AST 31 01/06/2021 1126   ALT 41 01/06/2021 1126   ALKPHOS 124 01/06/2021 1126   BILITOT 1.3 (H) 01/06/2021 1126   BILITOT 0.7 10/24/2019 0951   GFRNONAA >60 01/06/2021 1126   GFRAA >60 02/05/2020 1104   Lab Results  Component Value Date   WBC 8.3 01/06/2021   HGB 18.1 (H) 01/06/2021   HCT 51.9 01/06/2021   MCV 85.4 01/06/2021   PLT 192 01/06/2021    Imaging Studies: CT abdomen pelvis yesterday did not show any acute intra-abdominal pathology.  Diverticula was seen without diverticulitis.  Fatty infiltration of the liver present.   Assessment and Plan:   Blong Busk is a 50 y.o. y/o male here for evaluation of left lower quadrant abdominal pain  Clinical signs and symptoms and physical exam consistent with musculoskeletal etiology of his symptoms was reassuring CT scan and labs reviewed as per above.  PCP clinic note from yesterday reviewed as well  Patient was advised to use ice packs 2-3 times a day for the next 1 to 2 weeks until symptoms improve  Okay to use Tylenol as needed, less than 3000 mg a day, 6 to 8 hours apart  He was also advised to use an abdominal binder due to his abdominal girth, which may be also exacerbating his musculoskeletal pain.  Patient has previously complained of loose stools and used to drink Monster energy drinks, and now has switched over to diet drinks which he states has helped him control his blood sugars better.  We discussed that sugar substitutes, especially in beverages can be contributing to his loose stools.  We have advised him to decrease this if possible  Start Metamucil once a day to help bulk stool.  Okay to use Imodium sparingly for short period of time, but no more than 1-2 times a day as he is describing having some straining with stools, although is only having 2 bowel movements a day at  this time.  If this gets worse, patient advised to let us know and he verbalized understanding  Follow-up in 2 to 3 months to reassess symptoms or earlier if needed    Dr [name]

## 2021-01-07 NOTE — Progress Notes (Signed)
Yes. I recommend he set up a routine visit and we can address it then.  It has been consistently high for several years.

## 2021-01-14 ENCOUNTER — Other Ambulatory Visit
Admission: RE | Admit: 2021-01-14 | Discharge: 2021-01-14 | Disposition: A | Discharge status: Home / Self Care | Payer: 59 | Attending: Nurse Practitioner | Admitting: Nurse Practitioner

## 2021-01-14 ENCOUNTER — Encounter: Payer: Self-pay | Admitting: Nurse Practitioner

## 2021-01-14 ENCOUNTER — Other Ambulatory Visit: Payer: Self-pay

## 2021-01-14 ENCOUNTER — Ambulatory Visit (INDEPENDENT_AMBULATORY_CARE_PROVIDER_SITE_OTHER): Payer: 59 | Admitting: Nurse Practitioner

## 2021-01-14 VITALS — BP 137/87 | HR 80 | Temp 98.2°F | Wt 203.0 lb

## 2021-01-14 DIAGNOSIS — R7301 Impaired fasting glucose: Secondary | ICD-10-CM | POA: Insufficient documentation

## 2021-01-14 DIAGNOSIS — E119 Type 2 diabetes mellitus without complications: Secondary | ICD-10-CM | POA: Diagnosis not present

## 2021-01-14 DIAGNOSIS — E785 Hyperlipidemia, unspecified: Secondary | ICD-10-CM

## 2021-01-14 DIAGNOSIS — E1159 Type 2 diabetes mellitus with other circulatory complications: Secondary | ICD-10-CM

## 2021-01-14 DIAGNOSIS — E1169 Type 2 diabetes mellitus with other specified complication: Secondary | ICD-10-CM | POA: Diagnosis not present

## 2021-01-14 DIAGNOSIS — I152 Hypertension secondary to endocrine disorders: Secondary | ICD-10-CM

## 2021-01-14 LAB — COMPREHENSIVE METABOLIC PANEL
ALT: 43 U/L (ref 0–44)
AST: 42 U/L — ABNORMAL HIGH (ref 15–41)
Albumin: 4.4 g/dL (ref 3.5–5.0)
Alkaline Phosphatase: 124 U/L (ref 38–126)
Anion gap: 7 (ref 5–15)
BUN: 12 mg/dL (ref 6–20)
CO2: 27 mmol/L (ref 22–32)
Calcium: 8.6 mg/dL — ABNORMAL LOW (ref 8.9–10.3)
Chloride: 104 mmol/L (ref 98–111)
Creatinine, Ser: 0.96 mg/dL (ref 0.61–1.24)
GFR, Estimated: >60 mL/min (ref >60)
Glucose, Bld: 228 mg/dL — ABNORMAL HIGH (ref 70–99)
Potassium: 3.8 mmol/L (ref 3.5–5.1)
Sodium: 138 mmol/L (ref 135–145)
Total Bilirubin: 1.1 mg/dL (ref 0.3–1.2)
Total Protein: 7.7 g/dL (ref 6.5–8.1)

## 2021-01-14 LAB — LIPID PANEL
Cholesterol: 135 mg/dL (ref 0–200)
HDL: 35 mg/dL — ABNORMAL LOW (ref >40)
LDL Cholesterol: 43 mg/dL (ref 0–99)
Total CHOL/HDL Ratio: 3.9 RATIO
Triglycerides: 283 mg/dL — ABNORMAL HIGH (ref <150)
VLDL: 57 mg/dL — ABNORMAL HIGH (ref 0–40)

## 2021-01-14 NOTE — Addendum Note (Signed)
Addended by: Jon Billings on: 01/14/2021 03:39 PM   Modules accepted: Orders

## 2021-01-14 NOTE — Assessment & Plan Note (Signed)
Chronic.  Controlled.  Continue with current medication regimen.  Labs ordered today.  Return to clinic in 6 months for reevaluation.  Call sooner if concerns arise.  ? ?

## 2021-01-14 NOTE — Progress Notes (Signed)
BP 137/87   Pulse 80   Temp 98.2 F (36.8 C)   Wt 203 lb (92.1 kg)   SpO2 97%   BMI 30.87 kg/m    Subjective:    Patient ID: Keith Barber, male    DOB: 13-Jan-1971, 50 y.o.   MRN: [id number]  HPI: Keith Barber is a 50 y.o. male  Chief Complaint  Patient presents with   Diabetes   Dizziness    Patient states for several weeks he has noticed he has been getting dizzy only in the afternoon. Patient states he just takes a moment to relax and then it clears up.    HYPERTENSION Hypertension status: controlled  Satisfied with current treatment? yes Duration of hypertension: years BP monitoring frequency:  not checking BP range:  BP medication side effects:  no Medication compliance: excellent compliance Previous BP meds: metoprolol Aspirin: no Recurrent headaches: yes Visual changes: no Palpitations: no Dyspnea: no Chest pain: no Lower extremity edema: no Dizzy/lightheaded: yes  DIABETES Hypoglycemic episodes:no Polydipsia/polyuria: no Visual disturbance: no Chest pain: no Paresthesias: no Glucose Monitoring: no  Accucheck frequency: Not Checking  Fasting glucose:  Post prandial:  Evening:  Before meals: Taking Insulin?: no  Long acting insulin:  Short acting insulin: Blood Pressure Monitoring: not checking Retinal Examination: Up to Date Foot Exam: Up to Date Diabetic Education: Not Completed Pneumovax: Up to Date Influenza: Up to Date Aspirin: no   Relevant past medical, surgical, family and social history reviewed and updated as indicated. Interim medical history since our last visit reviewed. Allergies and medications reviewed and updated.  Review of Systems  Eyes:  Negative for visual disturbance.  Respiratory:  Negative for chest tightness and shortness of breath.   Cardiovascular:  Negative for chest pain, palpitations and leg swelling.  Endocrine: Negative for polydipsia and polyuria.  Neurological:  Positive for dizziness and  headaches. Negative for light-headedness and numbness.   Per HPI unless specifically indicated above     Objective:    BP 137/87   Pulse 80   Temp 98.2 F (36.8 C)   Wt 203 lb (92.1 kg)   SpO2 97%   BMI 30.87 kg/m   Wt Readings from Last 3 Encounters:  01/14/21 203 lb (92.1 kg)  01/07/21 203 lb 12.8 oz (92.4 kg)  01/06/21 203 lb 4 oz (92.2 kg)    Physical Exam Vitals and nursing note reviewed.  Constitutional:      General: He is not in acute distress.    Appearance: Normal appearance. He is not ill-appearing, toxic-appearing or diaphoretic.  HENT:     Head: Normocephalic.     Right Ear: External ear normal.     Left Ear: External ear normal.     Nose: Nose normal. No congestion or rhinorrhea.     Mouth/Throat:     Mouth: Mucous membranes are moist.  Eyes:     General:        Right eye: No discharge.        Left eye: No discharge.     Extraocular Movements: Extraocular movements intact.     Conjunctiva/sclera: Conjunctivae normal.     Pupils: Pupils are equal, round, and reactive to light.  Cardiovascular:     Rate and Rhythm: Normal rate and regular rhythm.     Heart sounds: No murmur heard. Pulmonary:     Effort: Pulmonary effort is normal. No respiratory distress.     Breath sounds: Normal breath sounds. No wheezing, rhonchi or rales.  Abdominal:     General: Abdomen is flat. Bowel sounds are normal.  Musculoskeletal:     Cervical back: Normal range of motion and neck supple.  Skin:    General: Skin is warm and dry.     Capillary Refill: Capillary refill takes less than 2 seconds.  Neurological:     General: No focal deficit present.     Mental Status: He is alert and oriented to person, place, and time.  Psychiatric:        Mood and Affect: Mood normal.        Behavior: Behavior normal.        Thought Content: Thought content normal.        Judgment: Judgment normal.    Results for orders placed or performed during the hospital encounter of 01/06/21   CBC with Differential/Platelet  Result Value Ref Range   WBC 8.3 4.0 - 10.5 K/uL   RBC 6.08 (H) 4.22 - 5.81 MIL/uL   Hemoglobin 18.1 (H) 13.0 - 17.0 g/dL   HCT 51.9 39.0 - 52.0 %   MCV 85.4 80.0 - 100.0 fL   MCH 29.8 26.0 - 34.0 pg   MCHC 34.9 30.0 - 36.0 g/dL   RDW 12.2 11.5 - 15.5 %   Platelets 192 150 - 400 K/uL   nRBC 0.0 0.0 - 0.2 %   Neutrophils Relative % 59 %   Neutro Abs 4.9 1.7 - 7.7 K/uL   Lymphocytes Relative 31 %   Lymphs Abs 2.6 0.7 - 4.0 K/uL   Monocytes Relative 7 %   Monocytes Absolute 0.6 0.1 - 1.0 K/uL   Eosinophils Relative 2 %   Eosinophils Absolute 0.1 0.0 - 0.5 K/uL   Basophils Relative 1 %   Basophils Absolute 0.1 0.0 - 0.1 K/uL   Immature Granulocytes 0 %   Abs Immature Granulocytes 0.02 0.00 - 0.07 K/uL  Comprehensive metabolic panel  Result Value Ref Range   Sodium 138 135 - 145 mmol/L   Potassium 4.4 3.5 - 5.1 mmol/L   Chloride 104 98 - 111 mmol/L   CO2 28 22 - 32 mmol/L   Glucose, Bld 156 (H) 70 - 99 mg/dL   BUN 13 6 - 20 mg/dL   Creatinine, Ser 1.00 0.61 - 1.24 mg/dL   Calcium 8.9 8.9 - 10.3 mg/dL   Total Protein 7.9 6.5 - 8.1 g/dL   Albumin 4.4 3.5 - 5.0 g/dL   AST 31 15 - 41 U/L   ALT 41 0 - 44 U/L   Alkaline Phosphatase 124 38 - 126 U/L   Total Bilirubin 1.3 (H) 0.3 - 1.2 mg/dL   GFR, Estimated >60 >60 mL/min   Anion gap 6 5 - 15      Assessment & Plan:   Problem List Items Addressed This Visit       Cardiovascular and Mediastinum   Hypertension associated with diabetes (HCC)    Chronic.  Controlled.  Continue with current medication regimen.  Labs ordered today.  Return to clinic in 6 months for reevaluation.  Call sooner if concerns arise.           Endocrine   Hyperlipidemia associated with type 2 diabetes mellitus (HCC)    Chronic.  Controlled.  Continue with current medication regimen.  Labs ordered today.  Return to clinic in 6 months for reevaluation.  Call sooner if concerns arise.         Type 2 diabetes  mellitus without complication, without long-term current  use of insulin (HCC)    Chronic.  Controlled.  Continue with current medication regimen.  Labs ordered today.  Return to clinic in 6 months for reevaluation.  Call sooner if concerns arise.         Other Visit Diagnoses     Impaired fasting glucose    -  Primary        Follow up plan: Return in about 6 months (around 07/16/2021) for Physical and Fasting labs.

## 2021-01-14 NOTE — Addendum Note (Signed)
Addended by: Jon Billings on: 01/14/2021 03:08 PM   Modules accepted: Orders

## 2021-01-15 LAB — HEMOGLOBIN A1C
Hgb A1c MFr Bld: 7.6 % — ABNORMAL HIGH (ref 4.8–5.6)
Mean Plasma Glucose: 171 mg/dL

## 2021-01-15 MED ORDER — DAPAGLIFLOZIN PROPANEDIOL 10 MG PO TABS: 10.0000 mg | ORAL_TABLET | Freq: Every day | ORAL | 1 refills | Status: DC

## 2021-01-15 NOTE — Progress Notes (Signed)
Hi Keith Barber.  Your A1c increased from 5.8 to 7.6.  I recommend you start Farxiga 10mg  daily to help with your a1c but also provides great protection for your kidneys.  If you agree to this, I can send it to the pharmacy for you.  Please let me know if you have any questions.

## 2021-02-26 ENCOUNTER — Other Ambulatory Visit: Payer: Self-pay | Admitting: Family Medicine

## 2021-03-30 ENCOUNTER — Other Ambulatory Visit: Payer: Self-pay | Admitting: Family Medicine

## 2021-04-06 ENCOUNTER — Telehealth: Payer: Self-pay | Admitting: Gastroenterology

## 2021-04-06 NOTE — Telephone Encounter (Signed)
Pt. Calling he would like to know if r. T has to see him in office for his next appt. He has covid and he wants to keep his appt but see if he can change it to mychart if she does not need to actually see him in office. Requesting a call back

## 2021-04-06 NOTE — Telephone Encounter (Signed)
Pt appt change to virtual video and pt is aware

## 2021-04-09 ENCOUNTER — Telehealth (INDEPENDENT_AMBULATORY_CARE_PROVIDER_SITE_OTHER): Payer: 59 | Admitting: Gastroenterology

## 2021-04-09 DIAGNOSIS — R12 Heartburn: Secondary | ICD-10-CM

## 2021-04-09 DIAGNOSIS — R1032 Left lower quadrant pain: Secondary | ICD-10-CM | POA: Diagnosis not present

## 2021-04-09 DIAGNOSIS — K760 Fatty (change of) liver, not elsewhere classified: Secondary | ICD-10-CM

## 2021-04-09 NOTE — Progress Notes (Signed)
Keith Antigua, MD [address]  [place]  [place], [place] [address]  Main: [phone]  Fax: [phone]   Primary Care Physician: Jon Billings, NP  Virtual Visit via Video Note  I connected with patient on 04/09/21 at  3:30 PM EDT by video and verified that I am speaking with the correct person using two identifiers.   I discussed the limitations, risks, security and privacy concerns of performing an evaluation and management service by video and the availability of in person appointments. I also discussed with the patient that there may be a patient responsible charge related to this service. The patient expressed understanding and agreed to proceed.  Location of Patient: Home Location of Provider: Home Persons involved: Patient and provider only (Nursing staff checked in patient via phone but were not physically involved in the video interaction - see their notes)   History of Present Illness: Chief complaint: Abdominal pain, heartburn  HPI: Keith Barber is a 50 y.o. male here for follow-up of abdominal pain and heartburn.  Reports left lower quadrant intermittent abdominal pain, 3/10, not related to meals.  Was advised on last visit to wear an abdominal binder and pain was thought to be musculoskeletal in etiology.  He has purchased an abdominal binder but has not started using it.  No nausea or vomiting.  Good appetite.  No weight loss.  Heartburn is all very occasional, and only occurring when he eats spicy foods or foods that trigger his symptoms.  Previous history: Most recent CT scan did not show diverticulitis Previously total bilirubin elevated, but normal direct bilirubin  Manometry and pH testing was ordered but patient could not tolerate the pH catheter only manometry was done which showed a normal motility study.   EGD July 2019 showed normal esophagus, regular Z line at 39 cm, gastric erythema, normal duodenum.  Pathology negative for H.  pylori. Colonoscopy July 2019 done for screening and history of diverticulitis, with one 3 mm rectosigmoid polyp removed, diverticulosis seen, internal hemorrhoids, otherwise normal.  Repeat recommended in 5 years.  Pathology showed hyperplastic polyp.   CT abdomen in April 2019 showed extensive hepatic steatosis.  Follow-up blood work in June 2019, with viral and autoimmune hepatitis work-up was negative.  Alk phos mildly chronically elevated, with most recent being from February 2020 around 120.  Patient denies any pruritus.  GGT mildly elevated as well.  Current Outpatient Medications  Medication Sig Dispense Refill   dapagliflozin propanediol (FARXIGA) 10 MG TABS tablet Take 1 tablet (10 mg total) by mouth daily before breakfast. 90 tablet 1   diclofenac Sodium (VOLTAREN) 1 % GEL Apply 2 g topically 4 (four) times daily.     loperamide (IMODIUM) 2 MG capsule Take 2-4 mg by mouth as needed for diarrhea or loose stools.     metoprolol succinate (TOPROL-XL) 25 MG 24 hr tablet Take 1 tablet by mouth once daily 90 tablet 0   naproxen sodium (ALEVE) 220 MG tablet Take 220 mg by mouth.     rizatriptan (MAXALT) 10 MG tablet Take 1 tablet by mouth twice daily as needed 10 tablet 0   rosuvastatin (CRESTOR) 10 MG tablet Take 1 tablet (10 mg total) by mouth daily. 90 tablet 1   No current facility-administered medications for this visit.    Allergies as of 04/09/2021 - Review Complete 01/14/2021  Allergen Reaction Noted   Amlodipine Itching 08/06/2020    Review of Systems:    All systems reviewed and negative  except where noted in HPI.   Observations/Objective:  Labs: CMP     Component Value Date/Time   NA 138 01/14/2021 1550   NA 140 08/30/2018 1441   K 3.8 01/14/2021 1550   CL 104 01/14/2021 1550   CO2 27 01/14/2021 1550   GLUCOSE 228 (H) 01/14/2021 1550   BUN 12 01/14/2021 1550   BUN 12 08/30/2018 1441   CREATININE 0.96 01/14/2021 1550   CALCIUM 8.6 (L) 01/14/2021 1550   PROT 7.7  01/14/2021 1550   PROT 7.6 10/24/2019 0951   ALBUMIN 4.4 01/14/2021 1550   ALBUMIN 4.7 10/24/2019 0951   AST 42 (H) 01/14/2021 1550   ALT 43 01/14/2021 1550   ALKPHOS 124 01/14/2021 1550   BILITOT 1.1 01/14/2021 1550   BILITOT 0.7 10/24/2019 0951   GFRNONAA >60 01/14/2021 1550   GFRAA >60 02/05/2020 1104   Lab Results  Component Value Date   WBC 8.3 01/06/2021   HGB 18.1 (H) 01/06/2021   HCT 51.9 01/06/2021   MCV 85.4 01/06/2021   PLT 192 01/06/2021    Imaging Studies: No results found.  Assessment and Plan:   Keith Barber is a 50 y.o. y/o male here for follow-up for abdominal pain is having occasional heartburn  Assessment and Plan: Heartburn is described to be the same in quality and severity as before and is likely the same musculoskeletal pain that was examined on last visit.  This was a video visit and therefore physical exam was not done.  Patient requested a video visit as he has positive COVID at this time.  Patient advised to start using abdominal binder that he purchased given his abdominal girth is likely exacerbating his musculoskeletal pain as well  Most recent CT scan reassuring  Is having 2-3 formed bowel movements a day  Is using Metamucil daily  Heartburn is occasional and only occurs when he eats certain foods.  Acid reflux lifestyle modifications and diet discussed  Weight loss encouraged for fatty liver  Follow-up in 6 months or earlier if needed  Follow Up Instructions:    I discussed the assessment and treatment plan with the patient. The patient was provided an opportunity to ask questions and all were answered. The patient agreed with the plan and demonstrated an understanding of the instructions.   The patient was advised to call back or seek an in-person evaluation if the symptoms worsen or if the condition fails to improve as anticipated.  I provided 15 minutes of face-to-face time via video software during this encounter.  Additional time was spent in reviewing patient's chart, placing orders etc.   Virgel Manifold, MD  Speech recognition software was used to dictate this note.

## 2021-06-02 ENCOUNTER — Other Ambulatory Visit: Payer: Self-pay | Admitting: Nurse Practitioner

## 2021-06-02 ENCOUNTER — Other Ambulatory Visit: Payer: Self-pay | Admitting: Family Medicine

## 2021-06-23 ENCOUNTER — Encounter: Payer: Self-pay | Admitting: Nurse Practitioner

## 2021-06-23 MED ORDER — METOPROLOL SUCCINATE ER 25 MG PO TB24: 25.0000 mg | ORAL_TABLET | Freq: Every day | ORAL | 0 refills | Status: DC

## 2021-07-02 ENCOUNTER — Encounter: Payer: 59 | Admitting: Nurse Practitioner

## 2021-07-15 NOTE — Progress Notes (Signed)
BP 138/83    Pulse 77    Temp 98 F (36.7 C) (Oral)    Ht 5' 8.2" (1.732 m)    Wt 185 lb 6.4 oz (84.1 kg)    SpO2 98%    BMI 28.02 kg/m    Subjective:    Patient ID: Keith Barber, male    DOB: 07-Jun-1971, 50 y.o.   MRN: [id number]  HPI: Keith Barber is a 50 y.o. male presenting on 07/16/2021 for comprehensive medical examination. Current medical complaints include:none  He currently lives with: Interim Problems from his last visit: no  HYPERTENSION / HYPERLIPIDEMIA Satisfied with current treatment? no Duration of hypertension: years BP monitoring frequency: not checking BP range:  BP medication side effects: no Past BP meds:  Metoprolol Duration of hyperlipidemia: years Cholesterol medication side effects: no Cholesterol supplements: none Past cholesterol medications: rosuvastatin (crestor) Medication compliance: excellent compliance Aspirin: no Recent stressors: no Recurrent headaches: no Visual changes: no Palpitations: no Dyspnea: no Chest pain: no Lower extremity edema: no Dizzy/lightheaded: no  DIABETES Patient states he has been having frequent urination with the [place].  He hasn't been taking it regularly due to the frequent urination.  In the last 10-12 days he has probably only taking it 7-8 days. He is drinking a lot of water at night. He thinks it is more related to the dry heat in his room at night versus his diabetes. Patient states that he is eating more, hungrier more but still losing weight. Patient states he was having diarrhea with the [place] also so he stopped taking it and restarted it when he ran out of the Metformin about 10 days ago. Hypoglycemic episodes:no Polydipsia/polyuria: yes Visual disturbance: no Chest pain: no Paresthesias: no Glucose Monitoring: no  Accucheck frequency: Not Checking  Fasting glucose:  Post prandial:  Evening:  Before meals: Taking Insulin?: no  Long acting insulin:  Short acting insulin: Blood  Pressure Monitoring: not checking Retinal Examination: Up to Date Foot Exam: Up to Date Diabetic Education: Not Completed Pneumovax: Up to Date Influenza: Up to Date Aspirin: no   Depression Screen done today and results listed below:  Depression screen [facility] 2/9 07/16/2021 09/17/2020 09/05/2019 08/30/2018 09/21/2017  Decreased Interest 0 1 0 0 0  Down, Depressed, Hopeless 0 1 0 0 0  PHQ - 2 Score 0 2 0 0 0  Altered sleeping '3 1 1 1 ' -  Tired, decreased energy '1 2 1 1 ' -  Change in appetite 0 0 0 0 -  Feeling bad or failure about yourself  0 0 0 0 -  Trouble concentrating 0 0 0 0 -  Moving slowly or fidgety/restless 0 0 0 0 -  Suicidal thoughts 0 0 0 0 -  PHQ-9 Score '4 5 2 2 ' -  Difficult doing work/chores Not difficult at all Not difficult at all - - -    The patient does not have a history of falls. I did complete a risk assessment for falls. A plan of care for falls was documented.   Past Medical History:  Past Medical History:  Diagnosis Date   Bone spur    LEFT SHOULDER THAT MAKES HIS LEFT ARM TINGLE - had removed   Diverticulitis large intestine 09/2017   GERD (gastroesophageal reflux disease)    Hemorrhoids    Hyperlipidemia    Hypertension    Migraines    rare migraines    Surgical History:  Past Surgical History:  Procedure Laterality Date  [place] STUDY N/A 05/30/2018   Procedure: [place] STUDY;  Surgeon: Virgel Manifold, MD;  Location: [facility];  Service: Gastroenterology;  Laterality: N/A;   COLONOSCOPY WITH PROPOFOL N/A 02/01/2018   Procedure: COLONOSCOPY WITH PROPOFOL;  Surgeon: Virgel Manifold, MD;  Location: [facility];  Service: Endoscopy;  Laterality: N/A;   ESOPHAGEAL MANOMETRY N/A 05/30/2018   Procedure: ESOPHAGEAL MANOMETRY (EM);  Surgeon: Virgel Manifold, MD;  Location: [facility];  Service: Gastroenterology;  Laterality: N/A;   ESOPHAGOGASTRODUODENOSCOPY (EGD) WITH PROPOFOL N/A 02/01/2018   Procedure:  ESOPHAGOGASTRODUODENOSCOPY (EGD) WITH PROPOFOL;  Surgeon: Virgel Manifold, MD;  Location: [facility];  Service: Endoscopy;  Laterality: N/A;   INGUINAL HERNIA REPAIR Bilateral 01/21/2017   Procedure: LAPAROSCOPIC BILATERAL INGUINAL HERNIA REPAIR;  Surgeon: Jules Husbands, MD;  Location: [facility];  Service: General;  Laterality: Bilateral;   KNEE SURGERY Left    SHOULDER ARTHROSCOPY WITH DISTAL CLAVICLE RESECTION Left 07/05/2017   Procedure: LEFT SHOULDER ARTHROSCOPY, SUBACROMIAL DECOMPRESSION, DISTAL CLAVICLE RESECTION, POSSIBLE MINI OPEN ROTATOR CUFF REPAIR;  Surgeon: [name], MD;  Location: [facility];  Service: Orthopedics;  Laterality: Left;   SHOULDER CLOSED REDUCTION Left 12/13/2017   Procedure: LEFT SHOULDER MANIPULATION with steroid injection;  Surgeon: [name], MD;  Location: [facility];  Service: Orthopedics;  Laterality: Left;   SHOULDER SURGERY Right    UMBILICAL HERNIA REPAIR N/A 01/21/2017   Procedure: HERNIA REPAIR UMBILICAL ADULT;  Surgeon: Jules Husbands, MD;  Location: [facility];  Service: General;  Laterality: N/A;    Medications:  Current Outpatient Medications on File Prior to Visit  Medication Sig   dapagliflozin propanediol (FARXIGA) 10 MG TABS tablet Take 1 tablet (10 mg total) by mouth daily before breakfast.   diclofenac Sodium (VOLTAREN) 1 % GEL Apply 2 g topically 4 (four) times daily.   loperamide (IMODIUM) 2 MG capsule Take 2-4 mg by mouth as needed for diarrhea or loose stools.   metoprolol succinate (TOPROL-XL) 25 MG 24 hr tablet Take 1 tablet (25 mg total) by mouth daily.   naproxen sodium (ALEVE) 220 MG tablet Take 220 mg by mouth.   rizatriptan (MAXALT) 10 MG tablet Take 1 tablet by mouth twice daily as needed   rosuvastatin (CRESTOR) 10 MG tablet Take 1 tablet by mouth once daily   No current facility-administered medications on file prior to visit.    Allergies:  Allergies  Allergen Reactions   Amlodipine Itching    Social History:   Social History   Socioeconomic History   Marital status: Divorced    Spouse name: Not on file   Number of children: Not on file   Years of education: Not on file   Highest education level: Not on file  Occupational History   Not on file  Tobacco Use   Smoking status: Former    Packs/day: 0.50    Years: 26.00    Pack years: 13.00    Types: Cigarettes    Quit date: 01/13/2017    Years since quitting: 4.5   Smokeless tobacco: Never   Tobacco comments:    PT STARTED TAKING ZYBAN ON 01-08-17 IN HOPES OF QUITTING  Vaping Use   Vaping Use: Never used  Substance and Sexual Activity   Alcohol use: No   Drug use: No   Sexual activity: Not on file  Other Topics Concern   Not on file  Social History Narrative   Not on file   Social Determinants of Health  Financial Resource Strain: Not on file  Food Insecurity: Not on file  Transportation Needs: Not on file  Physical Activity: Not on file  Stress: Not on file  Social Connections: Not on file  Intimate Partner Violence: Not on file   Social History   Tobacco Use  Smoking Status Former   Packs/day: 0.50   Years: 26.00   Pack years: 13.00   Types: Cigarettes   Quit date: 01/13/2017   Years since quitting: 4.5  Smokeless Tobacco Never  Tobacco Comments   PT STARTED TAKING ZYBAN ON 01-08-17 IN HOPES OF QUITTING   Social History   Substance and Sexual Activity  Alcohol Use No    Family History:  Family History  Problem Relation Age of Onset   Diabetes Mother    Migraines Mother    Cancer Neg Hx    COPD Neg Hx    Heart disease Neg Hx    Stroke Neg Hx     Past medical history, surgical history, medications, allergies, family history and social history reviewed with patient today and changes made to appropriate areas of the chart.   Review of Systems  Constitutional:  Positive for weight loss.  HENT:         Denies vision changes.  Eyes:  Negative for blurred vision and double vision.  Respiratory:  Negative  for shortness of breath.   Cardiovascular:  Negative for chest pain, palpitations and leg swelling.  Neurological:  Negative for dizziness, tingling and headaches.  Endo/Heme/Allergies:  Positive for polydipsia.       Denies Polyuria  All other ROS negative except what is listed above and in the HPI.      Objective:    BP 138/83    Pulse 77    Temp 98 F (36.7 C) (Oral)    Ht 5' 8.2" (1.732 m)    Wt 185 lb 6.4 oz (84.1 kg)    SpO2 98%    BMI 28.02 kg/m   Wt Readings from Last 3 Encounters:  07/16/21 185 lb 6.4 oz (84.1 kg)  01/14/21 203 lb (92.1 kg)  01/07/21 203 lb 12.8 oz (92.4 kg)    Physical Exam Vitals and nursing note reviewed.  Constitutional:      General: He is not in acute distress.    Appearance: Normal appearance. He is not ill-appearing, toxic-appearing or diaphoretic.  HENT:     Head: Normocephalic.     Right Ear: Tympanic membrane, ear canal and external ear normal.     Left Ear: Tympanic membrane, ear canal and external ear normal.     Nose: Nose normal. No congestion or rhinorrhea.     Mouth/Throat:     Mouth: Mucous membranes are moist.  Eyes:     General:        Right eye: No discharge.        Left eye: No discharge.     Extraocular Movements: Extraocular movements intact.     Conjunctiva/sclera: Conjunctivae normal.     Pupils: Pupils are equal, round, and reactive to light.  Cardiovascular:     Rate and Rhythm: Normal rate and regular rhythm.     Heart sounds: No murmur heard. Pulmonary:     Effort: Pulmonary effort is normal. No respiratory distress.     Breath sounds: Normal breath sounds. No wheezing, rhonchi or rales.  Abdominal:     General: Abdomen is flat. Bowel sounds are normal. There is no distension.     Palpations: Abdomen is  soft.     Tenderness: There is no abdominal tenderness. There is no guarding.  Musculoskeletal:     Cervical back: Normal range of motion and neck supple.  Skin:    General: Skin is warm and dry.     Capillary  Refill: Capillary refill takes less than 2 seconds.  Neurological:     General: No focal deficit present.     Mental Status: He is alert and oriented to person, place, and time.     Cranial Nerves: No cranial nerve deficit.     Motor: No weakness.     Deep Tendon Reflexes: Reflexes normal.  Psychiatric:        Mood and Affect: Mood normal.        Behavior: Behavior normal.        Thought Content: Thought content normal.        Judgment: Judgment normal.    Results for orders placed or performed during the hospital encounter of 01/14/21  Comp Met (CMET)  Result Value Ref Range   Sodium 138 135 - 145 mmol/L   Potassium 3.8 3.5 - 5.1 mmol/L   Chloride 104 98 - 111 mmol/L   CO2 27 22 - 32 mmol/L   Glucose, Bld 228 (H) 70 - 99 mg/dL   BUN 12 6 - 20 mg/dL   Creatinine, Ser 0.96 0.61 - 1.24 mg/dL   Calcium 8.6 (L) 8.9 - 10.3 mg/dL   Total Protein 7.7 6.5 - 8.1 g/dL   Albumin 4.4 3.5 - 5.0 g/dL   AST 42 (H) 15 - 41 U/L   ALT 43 0 - 44 U/L   Alkaline Phosphatase 124 38 - 126 U/L   Total Bilirubin 1.1 0.3 - 1.2 mg/dL   GFR, Estimated >60 >60 mL/min   Anion gap 7 5 - 15  HgB A1c  Result Value Ref Range   Hgb A1c MFr Bld 7.6 (H) 4.8 - 5.6 %   Mean Plasma Glucose 171 mg/dL  Lipid Profile  Result Value Ref Range   Cholesterol 135 0 - 200 mg/dL   Triglycerides 283 (H) <150 mg/dL   HDL 35 (L) >40 mg/dL   Total CHOL/HDL Ratio 3.9 RATIO   VLDL 57 (H) 0 - 40 mg/dL   LDL Cholesterol 43 0 - 99 mg/dL      Assessment & Plan:   Problem List Items Addressed This Visit       Cardiovascular and Mediastinum   Hypertension associated with diabetes (HCC)    Chronic.  Controlled.  Continue with current medication regimen on Metoprolol 45m daily.  Labs ordered today.  Return to clinic in 6 months for reevaluation.  Call sooner if concerns arise.           Endocrine   Hyperlipidemia associated with type 2 diabetes mellitus (HCC)    Chronic.  Controlled.  Continue with current medication  regimen on Crestor 129mdaily.  Labs ordered today.  Return to clinic in 3 months for reevaluation.  Call sooner if concerns arise.        Relevant Orders   Lipid panel   Controlled diabetes mellitus type 2 with complications (HCC)    Chronic. Likely not well controlled due to patient's symptoms of polyuria, polydipsia and weight loss. Will likley increase Metformin from 50070maily to Metformin 1000m44mD.  Labs ordered today.  Return to clinic in 3 months for reevaluation.  Call sooner if concerns arise.       Other Visit Diagnoses  Annual physical exam    -  Primary   Health maintenance reviewed during visit. Labs ordered today. Flu shot given. Up to date on Colonoscopy.   Relevant Orders   TSH   PSA   Lipid panel   CBC with Differential/Platelet   Comprehensive metabolic panel   Urinalysis, Routine w reflex microscopic   Need for influenza vaccination       Relevant Orders   Flu Vaccine QUAD 6moIM (Fluarix, Fluzone & Alfiuria Quad PF)        Discussed aspirin prophylaxis for myocardial infarction prevention and decision was it was not indicated  LABORATORY TESTING:  Health maintenance labs ordered today as discussed above.   The natural history of prostate cancer and ongoing controversy regarding screening and potential treatment outcomes of prostate cancer has been discussed with the patient. The meaning of a false positive PSA and a false negative PSA has been discussed. He indicates understanding of the limitations of this screening test and wishes to proceed with screening PSA testing.   IMMUNIZATIONS:   - Tdap: Tetanus vaccination status reviewed: last tetanus booster within 10 years. - Influenza: Administered today - Pneumovax: Up to date - Prevnar: Not applicable - COVID: Up to date - HPV: Not applicable - Shingrix vaccine:  Discussed today  SCREENING: - Colonoscopy: Up to date  Discussed with patient purpose of the colonoscopy is to detect colon cancer  at curable precancerous or early stages   - AAA Screening: Not applicable  -Hearing Test: Not applicable  -Spirometry: Not applicable   PATIENT COUNSELING:    Sexuality: Discussed sexually transmitted diseases, partner selection, use of condoms, avoidance of unintended pregnancy  and contraceptive alternatives.   Advised to avoid cigarette smoking.  I discussed with the patient that most people either abstain from alcohol or drink within safe limits (<=14/week and <=4 drinks/occasion for males, <=7/weeks and <= 3 drinks/occasion for females) and that the risk for alcohol disorders and other health effects rises proportionally with the number of drinks per week and how often a drinker exceeds daily limits.  Discussed cessation/primary prevention of drug use and availability of treatment for abuse.   Diet: Encouraged to adjust caloric intake to maintain  or achieve ideal body weight, to reduce intake of dietary saturated fat and total fat, to limit sodium intake by avoiding high sodium foods and not adding table salt, and to maintain adequate dietary potassium and calcium preferably from fresh fruits, vegetables, and low-fat dairy products.    stressed the importance of regular exercise  Injury prevention: Discussed safety belts, safety helmets, smoke detector, smoking near bedding or upholstery.   Dental health: Discussed importance of regular tooth brushing, flossing, and dental visits.   Follow up plan: NEXT PREVENTATIVE PHYSICAL DUE IN 1 YEAR. Return in about 3 months (around 10/14/2021) for HTN, HLD, DM2 FU.

## 2021-07-16 ENCOUNTER — Encounter: Payer: 59 | Admitting: Nurse Practitioner

## 2021-07-16 ENCOUNTER — Other Ambulatory Visit
Admission: RE | Admit: 2021-07-16 | Discharge: 2021-07-16 | Disposition: A | Discharge status: Home / Self Care | Payer: 59 | Source: Ambulatory Visit | Attending: Nurse Practitioner | Admitting: Nurse Practitioner

## 2021-07-16 ENCOUNTER — Ambulatory Visit (INDEPENDENT_AMBULATORY_CARE_PROVIDER_SITE_OTHER): Payer: 59 | Admitting: Nurse Practitioner

## 2021-07-16 ENCOUNTER — Other Ambulatory Visit: Payer: Self-pay

## 2021-07-16 ENCOUNTER — Encounter: Payer: Self-pay | Admitting: Nurse Practitioner

## 2021-07-16 VITALS — BP 138/83 | HR 77 | Temp 98.0°F | Ht 68.2 in | Wt 185.4 lb

## 2021-07-16 DIAGNOSIS — E118 Type 2 diabetes mellitus with unspecified complications: Secondary | ICD-10-CM

## 2021-07-16 DIAGNOSIS — E1169 Type 2 diabetes mellitus with other specified complication: Secondary | ICD-10-CM

## 2021-07-16 DIAGNOSIS — E119 Type 2 diabetes mellitus without complications: Secondary | ICD-10-CM | POA: Diagnosis present

## 2021-07-16 DIAGNOSIS — Z23 Encounter for immunization: Secondary | ICD-10-CM | POA: Diagnosis not present

## 2021-07-16 DIAGNOSIS — E785 Hyperlipidemia, unspecified: Secondary | ICD-10-CM | POA: Insufficient documentation

## 2021-07-16 DIAGNOSIS — E1159 Type 2 diabetes mellitus with other circulatory complications: Secondary | ICD-10-CM

## 2021-07-16 DIAGNOSIS — I152 Hypertension secondary to endocrine disorders: Secondary | ICD-10-CM

## 2021-07-16 DIAGNOSIS — Z0000 Encounter for general adult medical examination without abnormal findings: Secondary | ICD-10-CM | POA: Diagnosis not present

## 2021-07-16 LAB — CBC WITH DIFFERENTIAL/PLATELET
Abs Immature Granulocytes: 0.02 10*3/uL (ref 0.00–0.07)
Basophils Absolute: 0.1 10*3/uL (ref 0.0–0.1)
Basophils Relative: 1 %
Eosinophils Absolute: 0.2 10*3/uL (ref 0.0–0.5)
Eosinophils Relative: 2 %
HCT: 55.3 % — ABNORMAL HIGH (ref 39.0–52.0)
Hemoglobin: 18.7 g/dL — ABNORMAL HIGH (ref 13.0–17.0)
Immature Granulocytes: 0 %
Lymphocytes Relative: 28 %
Lymphs Abs: 2 10*3/uL (ref 0.7–4.0)
MCH: 29 pg (ref 26.0–34.0)
MCHC: 33.8 g/dL (ref 30.0–36.0)
MCV: 85.7 fL (ref 80.0–100.0)
Monocytes Absolute: 0.6 10*3/uL (ref 0.1–1.0)
Monocytes Relative: 8 %
Neutro Abs: 4.4 10*3/uL (ref 1.7–7.7)
Neutrophils Relative %: 61 %
Platelets: 193 10*3/uL (ref 150–400)
RBC: 6.45 MIL/uL — ABNORMAL HIGH (ref 4.22–5.81)
RDW: 12.5 % (ref 11.5–15.5)
WBC: 7.3 10*3/uL (ref 4.0–10.5)
nRBC: 0 % (ref 0.0–0.2)

## 2021-07-16 LAB — COMPREHENSIVE METABOLIC PANEL
ALT: 38 U/L (ref 0–44)
AST: 31 U/L (ref 15–41)
Albumin: 4.6 g/dL (ref 3.5–5.0)
Alkaline Phosphatase: 156 U/L — ABNORMAL HIGH (ref 38–126)
Anion gap: 10 (ref 5–15)
BUN: 13 mg/dL (ref 6–20)
CO2: 24 mmol/L (ref 22–32)
Calcium: 9 mg/dL (ref 8.9–10.3)
Chloride: 103 mmol/L (ref 98–111)
Creatinine, Ser: 0.77 mg/dL (ref 0.61–1.24)
GFR, Estimated: >60 mL/min (ref >60)
Glucose, Bld: 260 mg/dL — ABNORMAL HIGH (ref 70–99)
Potassium: 3.8 mmol/L (ref 3.5–5.1)
Sodium: 137 mmol/L (ref 135–145)
Total Bilirubin: 1.3 mg/dL — ABNORMAL HIGH (ref 0.3–1.2)
Total Protein: 7.8 g/dL (ref 6.5–8.1)

## 2021-07-16 LAB — LIPID PANEL
Cholesterol: 129 mg/dL (ref 0–200)
HDL: 39 mg/dL — ABNORMAL LOW (ref >40)
LDL Cholesterol: 58 mg/dL (ref 0–99)
Total CHOL/HDL Ratio: 3.3 RATIO
Triglycerides: 162 mg/dL — ABNORMAL HIGH (ref <150)
VLDL: 32 mg/dL (ref 0–40)

## 2021-07-16 LAB — HEMOGLOBIN A1C
Hgb A1c MFr Bld: 10.3 % — ABNORMAL HIGH (ref 4.8–5.6)
Mean Plasma Glucose: 248.91 mg/dL

## 2021-07-16 LAB — PSA: Prostatic Specific Antigen: 0.38 ng/mL (ref 0.00–4.00)

## 2021-07-16 LAB — TSH: TSH: 2.39 u[IU]/mL (ref 0.350–4.500)

## 2021-07-16 NOTE — Assessment & Plan Note (Addendum)
Chronic.  Controlled.  Continue with current medication regimen on Metoprolol 25mg  daily.  Labs ordered today.  Return to clinic in 6 months for reevaluation.  Call sooner if concerns arise.

## 2021-07-16 NOTE — Assessment & Plan Note (Signed)
Chronic. Likely not well controlled due to patient's symptoms of polyuria, polydipsia and weight loss. Will likley increase Metformin from 500mg  daily to Metformin 1000mg  BID.  Labs ordered today.  Return to clinic in 3 months for reevaluation.  Call sooner if concerns arise.

## 2021-07-16 NOTE — Assessment & Plan Note (Signed)
Chronic.  Controlled.  Continue with current medication regimen on Crestor 10mg daily.  Labs ordered today.  Return to clinic in 3 months for reevaluation.  Call sooner if concerns arise.   

## 2021-07-17 ENCOUNTER — Other Ambulatory Visit
Admission: RE | Admit: 2021-07-17 | Discharge: 2021-07-17 | Disposition: A | Discharge status: Home / Self Care | Payer: 59 | Source: Ambulatory Visit | Attending: Nurse Practitioner | Admitting: Nurse Practitioner

## 2021-07-17 DIAGNOSIS — Z0000 Encounter for general adult medical examination without abnormal findings: Secondary | ICD-10-CM | POA: Insufficient documentation

## 2021-07-17 LAB — URINALYSIS, COMPLETE (UACMP) WITH MICROSCOPIC
Bacteria, UA: NONE SEEN
Bilirubin Urine: NEGATIVE
Glucose, UA: >=500 mg/dL — AB
Hgb urine dipstick: NEGATIVE
Ketones, ur: 5 mg/dL — AB
Leukocytes,Ua: NEGATIVE
Nitrite: NEGATIVE
Protein, ur: NEGATIVE mg/dL
Specific Gravity, Urine: 1.033 — ABNORMAL HIGH (ref 1.005–1.030)
Squamous Epithelial / HPF: NONE SEEN (ref 0–5)
WBC, UA: NONE SEEN WBC/hpf (ref 0–5)
pH: 5 (ref 5.0–8.0)

## 2021-07-17 MED ORDER — TRULICITY 0.75 MG/0.5ML ~~LOC~~ SOAJ: 0.7500 mg | SUBCUTANEOUS | 1 refills | Status: DC

## 2021-07-17 MED ORDER — METFORMIN HCL 500 MG PO TABS: 500.0000 mg | ORAL_TABLET | Freq: Two times a day (BID) | ORAL | 1 refills | Status: DC

## 2021-07-17 NOTE — Progress Notes (Signed)
Keith Barber. Your urine shows that you have a lot of glucose and ketones in your urine due to your diabetes not being well controlled.

## 2021-07-17 NOTE — Progress Notes (Signed)
Please let patient know that his lab work shows that his prostate cancer screening is within normal limits.  Triglycerides are elevated. Recommend decreasing processed food and refined sugar intake. Complete blood count is consistent with prior.   A1c increased to 10.3.  We need to restart the Metformin at 500mg  twice a day then after two weeks increase to 1000mg  twice daily.  I also recommend he start a once weekly injection call Trulicity to help control his diabetes.  If he agrees I can send these into the pharmacy.  Follow up in 3 months for repeat labs.

## 2021-07-17 NOTE — Addendum Note (Signed)
Addended by: Jon Billings on: 07/17/2021 04:20 PM   Modules accepted: Orders

## 2021-07-17 NOTE — Progress Notes (Signed)
Patient came by to say that he saw the MyChart message.  He agrees to the metformin and trulictiy. Would like it sent to his pharmacy.  Medications sent in for patient.

## 2021-08-09 ENCOUNTER — Other Ambulatory Visit: Payer: Self-pay | Admitting: Nurse Practitioner

## 2021-08-09 NOTE — Telephone Encounter (Signed)
Requested Prescriptions  Pending Prescriptions Disp Refills   rizatriptan (MAXALT) 10 MG tablet [Pharmacy Med Name: Rizatriptan Benzoate 10 MG Oral Tablet] 10 tablet 0    Sig: Take 1 tablet by mouth twice daily as needed     Neurology:  Migraine Therapy - Triptan Passed - 08/09/2021 12:12 PM      Passed - Last BP in normal range    BP Readings from Last 1 Encounters:  07/16/21 138/83         Passed - Valid encounter within last 12 months    Recent Outpatient Visits          3 weeks ago Annual physical exam   [facility] Jon Billings, NP   6 months ago Hypertension associated with diabetes [facility])   [facility] Jon Billings, NP   7 months ago Left lower quadrant abdominal pain   [place], NP   10 months ago Ingrown toenail of right foot   [facility] Jon Billings, NP   10 months ago Ingrown toenail of right foot   [place], Barb Merino, DO      Future Appointments            In 2 months Jon Billings, NP [facility], [place]

## 2021-08-11 LAB — HM DIABETES EYE EXAM

## 2021-08-20 ENCOUNTER — Telehealth: Payer: Self-pay | Admitting: Nurse Practitioner

## 2021-08-20 NOTE — Telephone Encounter (Signed)
Prior authorization was initiated for Trulicity. Awaiting determination from patient's insurance.  PRX:[id number]

## 2021-08-20 NOTE — Telephone Encounter (Signed)
Copied from [place] [id number]. Topic: Quick Communication - Rx Refill/Question >> Aug 19, 2021  4:26 PM Pawlus, Brayton Layman A wrote: Pt called in regarding a PA that is needed for Dulaglutide (TRULICITY) 0.35 CY/8.1YH SOPN, pt changed insurance and stated he needs this pre approved to pick it up from the pharmacy.

## 2021-08-21 NOTE — Telephone Encounter (Signed)
See patient chart. Patient was notified that his prescription for Trulicity was approved via [place].

## 2021-08-31 ENCOUNTER — Other Ambulatory Visit: Payer: Self-pay | Admitting: Nurse Practitioner

## 2021-09-01 NOTE — Telephone Encounter (Signed)
Requested medication (s) are due for refill today: yes  Requested medication (s) are on the active medication list: yes  Last refill:  06/02/21 #90 with 0 RF  Future visit scheduled: 10/16/21  Notes to clinic:  failed protocol of lipid labs within normal range within 12 months, please assess.  Requested Prescriptions  Pending Prescriptions Disp Refills   rosuvastatin (CRESTOR) 10 MG tablet [Pharmacy Med Name: Rosuvastatin Calcium 10 MG Oral Tablet] 90 tablet 0    Sig: Take 1 tablet by mouth once daily     Cardiovascular:  Antilipid - Statins 2 Failed - 08/31/2021  7:20 AM      Failed - Lipid Panel in normal range within the last 12 months    Cholesterol, Total  Date Value Ref Range Status  02/23/2018 154 100 - 199 mg/dL Final   Cholesterol  Date Value Ref Range Status  07/16/2021 129 0 - 200 mg/dL Final   LDL Calculated  Date Value Ref Range Status  02/23/2018 57 0 - 99 mg/dL Final   LDL Cholesterol  Date Value Ref Range Status  07/16/2021 58 0 - 99 mg/dL Final    Comment:           Total Cholesterol/HDL:CHD Risk Coronary Heart Disease Risk Table                     Men   Women  1/2 Average Risk   3.4   3.3  Average Risk       5.0   4.4  2 X Average Risk   9.6   7.1  3 X Average Risk  23.4   11.0        Use the calculated Patient Ratio above and the CHD Risk Table to determine the patient's CHD Risk.        ATP III CLASSIFICATION (LDL):  <100     mg/dL   Optimal  100-129  mg/dL   Near or Above                    Optimal  130-159  mg/dL   Borderline  160-189  mg/dL   High  >190     mg/dL   Very High Performed at [facility], [place], [place] [address]    HDL  Date Value Ref Range Status  07/16/2021 39 (L) >40 mg/dL Final  02/23/2018 35 (L) >39 mg/dL Final   Triglycerides  Date Value Ref Range Status  07/16/2021 162 (H) <150 mg/dL Final         Passed - Cr in normal range and within 360 days    Creatinine, Ser  Date Value  Ref Range Status  07/16/2021 0.77 0.61 - 1.24 mg/dL Final          Passed - Patient is not pregnant      Passed - Valid encounter within last 12 months    Recent Outpatient Visits           1 month ago Annual physical exam   [facility] Jon Billings, NP   7 months ago Hypertension associated with diabetes [facility])   [facility] Jon Billings, NP   7 months ago Left lower quadrant abdominal pain   [facility] Jon Billings, NP   11 months ago Ingrown toenail of right foot   [facility] Jon Billings, NP   11 months ago Ingrown toenail of right foot   [facility] Valerie Roys, DO       Future Appointments             In 1 month Jon Billings, NP [facility] [place]

## 2021-09-19 ENCOUNTER — Other Ambulatory Visit: Payer: Self-pay | Admitting: Nurse Practitioner

## 2021-09-21 NOTE — Telephone Encounter (Signed)
Requested Prescriptions  ?Pending Prescriptions Disp Refills  ?? rizatriptan (MAXALT) 10 MG tablet [Pharmacy Med Name: Rizatriptan Benzoate 10 MG Oral Tablet] 10 tablet 0  ?  Sig: Take 1 tablet by mouth twice daily as needed  ?  ? Neurology:  Migraine Therapy - Triptan Passed - 09/19/2021  5:57 PM  ?  ?  Passed - Last BP in normal range  ?  BP Readings from Last 1 Encounters:  ?07/16/21 138/83  ?   ?  ?  Passed - Valid encounter within last 12 months  ?  Recent Outpatient Visits   ?      ? 2 months ago Annual physical exam  ? [place], NP  ? 8 months ago Hypertension associated with diabetes ([place])  ? [place], NP  ? 8 months ago Left lower quadrant abdominal pain  ? [place], NP  ? 12 months ago Ingrown toenail of right foot  ? [place], NP  ? 12 months ago Ingrown toenail of right foot  ? [place], [place] P, DO  ?  ?  ?Future Appointments   ?        ? In 3 weeks Jon Billings, NP [facility]  ?  ? ?  ?  ?  ?? metoprolol succinate (TOPROL-XL) 25 MG 24 hr tablet [Pharmacy Med Name: Metoprolol Succinate ER 25 MG Oral Tablet Extended Release 24 Hour] 90 tablet 0  ?  Sig: Take 1 tablet by mouth once daily  ?  ? Cardiovascular:  Beta Blockers Passed - 09/19/2021  5:57 PM  ?  ?  Passed - Last BP in normal range  ?  BP Readings from Last 1 Encounters:  ?07/16/21 138/83  ?   ?  ?  Passed - Last Heart Rate in normal range  ?  Pulse Readings from Last 1 Encounters:  ?07/16/21 77  ?   ?  ?  Passed - Valid encounter within last 6 months  ?  Recent Outpatient Visits   ?      ? 2 months ago Annual physical exam  ? [place], NP  ? 8 months ago Hypertension associated with diabetes ([place])  ? [place], NP  ? 8 months ago Left lower quadrant abdominal pain  ? [place], NP  ? 12 months ago Ingrown toenail of right foot  ? [place], NP  ? 12 months ago Ingrown toenail of right foot  ? [place], [place] P, DO  ?  ?  ?Future Appointments   ?        ? In 3 weeks Jon Billings, NP [facility], [facility]  ?  ? ?  ?  ?  ? ?

## 2021-10-15 ENCOUNTER — Other Ambulatory Visit
Admission: RE | Admit: 2021-10-15 | Discharge: 2021-10-15 | Disposition: A | Discharge status: Home / Self Care | Payer: 59 | Source: Ambulatory Visit | Attending: Nurse Practitioner | Admitting: Nurse Practitioner

## 2021-10-15 ENCOUNTER — Encounter: Payer: Self-pay | Admitting: Nurse Practitioner

## 2021-10-15 ENCOUNTER — Ambulatory Visit (INDEPENDENT_AMBULATORY_CARE_PROVIDER_SITE_OTHER): Payer: 59 | Admitting: Nurse Practitioner

## 2021-10-15 VITALS — BP 129/82 | HR 81 | Temp 98.2°F | Ht 68.3 in | Wt 194.0 lb

## 2021-10-15 DIAGNOSIS — E1169 Type 2 diabetes mellitus with other specified complication: Secondary | ICD-10-CM | POA: Diagnosis not present

## 2021-10-15 DIAGNOSIS — I152 Hypertension secondary to endocrine disorders: Secondary | ICD-10-CM

## 2021-10-15 DIAGNOSIS — E1159 Type 2 diabetes mellitus with other circulatory complications: Secondary | ICD-10-CM

## 2021-10-15 DIAGNOSIS — E785 Hyperlipidemia, unspecified: Secondary | ICD-10-CM

## 2021-10-15 DIAGNOSIS — E118 Type 2 diabetes mellitus with unspecified complications: Secondary | ICD-10-CM | POA: Diagnosis not present

## 2021-10-15 LAB — COMPREHENSIVE METABOLIC PANEL
ALT: 50 U/L — ABNORMAL HIGH (ref 0–44)
AST: 29 U/L (ref 15–41)
Albumin: 4.4 g/dL (ref 3.5–5.0)
Alkaline Phosphatase: 135 U/L — ABNORMAL HIGH (ref 38–126)
Anion gap: 8 (ref 5–15)
BUN: 13 mg/dL (ref 6–20)
CO2: 29 mmol/L (ref 22–32)
Calcium: 8.9 mg/dL (ref 8.9–10.3)
Chloride: 102 mmol/L (ref 98–111)
Creatinine, Ser: 0.76 mg/dL (ref 0.61–1.24)
GFR, Estimated: >60 mL/min (ref >60)
Glucose, Bld: 222 mg/dL — ABNORMAL HIGH (ref 70–99)
Potassium: 4.2 mmol/L (ref 3.5–5.1)
Sodium: 139 mmol/L (ref 135–145)
Total Bilirubin: 1.5 mg/dL — ABNORMAL HIGH (ref 0.3–1.2)
Total Protein: 8 g/dL (ref 6.5–8.1)

## 2021-10-15 LAB — LIPID PANEL
Cholesterol: 138 mg/dL (ref 0–200)
HDL: 39 mg/dL — ABNORMAL LOW (ref >40)
LDL Cholesterol: 60 mg/dL (ref 0–99)
Total CHOL/HDL Ratio: 3.5 RATIO
Triglycerides: 194 mg/dL — ABNORMAL HIGH (ref <150)
VLDL: 39 mg/dL (ref 0–40)

## 2021-10-15 LAB — HEMOGLOBIN A1C
Hgb A1c MFr Bld: 7.9 % — ABNORMAL HIGH (ref 4.8–5.6)
Mean Plasma Glucose: 180.03 mg/dL

## 2021-10-15 MED ORDER — TRULICITY 0.75 MG/0.5ML ~~LOC~~ SOAJ: 0.7500 mg | SUBCUTANEOUS | 1 refills | Status: DC

## 2021-10-15 NOTE — Progress Notes (Signed)
? ?BP 129/82   Pulse 81   Temp 98.2 ?F (36.8 ?C) (Oral)   Ht 5' 8.3" (1.735 m)   Wt 194 lb (88 kg)   SpO2 96%   BMI 29.24 kg/m?   ? ?Subjective:  ? ? Patient ID: Keith Barber, male    DOB: 1971/06/18, 51 y.o.   MRN: [id number] ? ?HPI: ?Keith Barber is a 51 y.o. male ? ?Chief Complaint  ?Patient presents with  ? Hypertension  ? Hyperlipidemia  ? Diabetes  ?  Patient recent Diabetic Eye Exam was requested at today's visit. Patient had appt 08/12/2021.  ? ?HYPERTENSION / HYPERLIPIDEMIA ?Satisfied with current treatment? yes ?Duration of hypertension: years ?BP monitoring frequency: daily ?BP range: 130-132/80 ?BP medication side effects: no ?Past BP meds:  Metoprolol  ?Duration of hyperlipidemia: years ?Cholesterol medication side effects: no ?Cholesterol supplements: none ?Past cholesterol medications: Crestor ?Medication compliance: excellent compliance ?Aspirin: no ?Recent stressors: no ?Recurrent headaches: no ?Visual changes: no ?Palpitations: no ?Dyspnea: no ?Chest pain: no ?Lower extremity edema: no ?Dizzy/lightheaded: no ? ?DIABETES ?Hypoglycemic episodes:no ?Polydipsia/polyuria: yes ?Visual disturbance: no ?Chest pain: no ?Paresthesias: no ?Glucose Monitoring: no ? Accucheck frequency: Not Checking ? Fasting glucose: ? Post prandial: ? Evening: ? Before meals: ?Taking Insulin?: no ? Long acting insulin: ? Short acting insulin: ?Blood Pressure Monitoring: daily ?Retinal Examination: Up to Date ?Foot Exam: Up to Date ?Diabetic Education: Not Completed ?Pneumovax: Up to Date ?Influenza: Up to Date ?Aspirin: no ?Patient states he did the 4 week supply and then he needed a PA and he was never able to get the refill.  ? ?Relevant past medical, surgical, family and social history reviewed and updated as indicated. Interim medical history since our last visit reviewed. ?Allergies and medications reviewed and updated. ? ?Review of Systems  ?Eyes:  Negative for visual disturbance.  ?Respiratory:   Negative for chest tightness and shortness of breath.   ?Cardiovascular:  Negative for chest pain, palpitations and leg swelling.  ?Endocrine: Negative for polydipsia and polyuria.  ?Neurological:  Negative for dizziness, light-headedness, numbness and headaches.  ? ?Per HPI unless specifically indicated above ? ?   ?Objective:  ?  ?BP 129/82   Pulse 81   Temp 98.2 ?F (36.8 ?C) (Oral)   Ht 5' 8.3" (1.735 m)   Wt 194 lb (88 kg)   SpO2 96%   BMI 29.24 kg/m?   ?Wt Readings from Last 3 Encounters:  ?10/15/21 194 lb (88 kg)  ?07/16/21 185 lb 6.4 oz (84.1 kg)  ?01/14/21 203 lb (92.1 kg)  ?  ?Physical Exam ?Vitals and nursing note reviewed.  ?Constitutional:   ?   General: He is not in acute distress. ?   Appearance: Normal appearance. He is not ill-appearing, toxic-appearing or diaphoretic.  ?HENT:  ?   Head: Normocephalic.  ?   Right Ear: External ear normal.  ?   Left Ear: External ear normal.  ?   Nose: Nose normal. No congestion or rhinorrhea.  ?   Mouth/Throat:  ?   Mouth: Mucous membranes are moist.  ?Eyes:  ?   General:     ?   Right eye: No discharge.     ?   Left eye: No discharge.  ?   Extraocular Movements: Extraocular movements intact.  ?   Conjunctiva/sclera: Conjunctivae normal.  ?   Pupils: Pupils are equal, round, and reactive to light.  ?Cardiovascular:  ?   Rate and Rhythm: Normal rate and regular rhythm.  ?  Heart sounds: No murmur heard. ?Pulmonary:  ?   Effort: Pulmonary effort is normal. No respiratory distress.  ?   Breath sounds: Normal breath sounds. No wheezing, rhonchi or rales.  ?Abdominal:  ?   General: Abdomen is flat. Bowel sounds are normal.  ?Musculoskeletal:  ?   Cervical back: Normal range of motion and neck supple.  ?Skin: ?   General: Skin is warm and dry.  ?   Capillary Refill: Capillary refill takes less than 2 seconds.  ?Neurological:  ?   General: No focal deficit present.  ?   Mental Status: He is alert and oriented to person, place, and time.  ?Psychiatric:     ?   Mood  and Affect: Mood normal.     ?   Behavior: Behavior normal.     ?   Thought Content: Thought content normal.     ?   Judgment: Judgment normal.  ? ? ?Results for orders placed or performed during the hospital encounter of 07/17/21  ?Urinalysis, Complete w Microscopic  ?Result Value Ref Range  ? Color, Urine YELLOW (A) YELLOW  ? APPearance CLEAR (A) CLEAR  ? Specific Gravity, Urine 1.033 (H) 1.005 - 1.030  ? pH 5.0 5.0 - 8.0  ? Glucose, UA >=500 (A) NEGATIVE mg/dL  ? Hgb urine dipstick NEGATIVE NEGATIVE  ? Bilirubin Urine NEGATIVE NEGATIVE  ? Ketones, ur 5 (A) NEGATIVE mg/dL  ? Protein, ur NEGATIVE NEGATIVE mg/dL  ? Nitrite NEGATIVE NEGATIVE  ? Leukocytes,Ua NEGATIVE NEGATIVE  ? WBC, UA NONE SEEN 0 - 5 WBC/hpf  ? Bacteria, UA NONE SEEN NONE SEEN  ? Squamous Epithelial / LPF NONE SEEN 0 - 5  ? ?   ?Assessment & Plan:  ? ?Problem List Items Addressed This Visit   ? ?  ? Cardiovascular and Mediastinum  ? Hypertension associated with diabetes ([place])  ?  Chronic.  Controlled.  Continue with current medication regimen on Metoprolol 44m daily.  Labs ordered today.  Return to clinic in 3 months for reevaluation.  Call sooner if concerns arise.  ? ? ?  ?  ? Relevant Medications  ? Dulaglutide (TRULICITY) 01.97MJO/8.3GPSOPN  ? Other Relevant Orders  ? Comp Met (CMET)  ?  ? Endocrine  ? Hyperlipidemia associated with type 2 diabetes mellitus ([place]  ?  Chronic.  Controlled.  Continue with current medication regimen on Crestor 144mdaily.  Labs ordered today.  Return to clinic in 3 months for reevaluation.  Call sooner if concerns arise.  ? ?  ?  ? Relevant Medications  ? Dulaglutide (TRULICITY) 0.4.98GYM/4.1RAOPN  ? Other Relevant Orders  ? Lipid Profile  ? Controlled diabetes mellitus type 2 with complications ([place] ?  Chronic. Likely still not well controlled due to patient only taking Trulicity for 4 weeks.  Last A1c was 10.3.   Labs ordered today.  Return to clinic in 3 months for reevaluation.  Call sooner if  concerns arise.  ?  ?  ? Relevant Medications  ? Dulaglutide (TRULICITY) 0.3.09GMM/7.6KGOPN  ? Other Relevant Orders  ? HgB A1c  ? Microalbumin, Urine Waived  ?  ? ?Follow up plan: ?Return in about 3 months (around 01/15/2022) for HTN, HLD, DM2 FU. ? ? ? ? ? ?

## 2021-10-15 NOTE — Assessment & Plan Note (Signed)
Chronic.  Controlled.  Continue with current medication regimen on Crestor 10mg daily.  Labs ordered today.  Return to clinic in 3 months for reevaluation.  Call sooner if concerns arise.   

## 2021-10-15 NOTE — Assessment & Plan Note (Addendum)
Chronic.  Controlled.  Continue with current medication regimen on Metoprolol '25mg'$  daily.  Labs ordered today.  Return to clinic in 3 months for reevaluation.  Call sooner if concerns arise.  ? ? ?

## 2021-10-15 NOTE — Assessment & Plan Note (Signed)
Chronic. Likely still not well controlled due to patient only taking Trulicity for 4 weeks.  Last A1c was 10.3.   Labs ordered today.  Return to clinic in 3 months for reevaluation.  Call sooner if concerns arise.  ?

## 2021-10-16 ENCOUNTER — Ambulatory Visit: Payer: 59 | Admitting: Nurse Practitioner

## 2021-10-16 LAB — MICROALBUMIN, URINE: Microalb, Ur: 138.9 ug/mL — ABNORMAL HIGH

## 2021-10-16 NOTE — Progress Notes (Signed)
Please let patient know that his lab work shows that his A1c improved from 10.3 to 7.9.  This is much better but still above goal.  I recommend he start the Trulicity again to help with this.  Otherwise his lab work looks good.  His liver enzymes improved some from prior.  No other concerns at this time.  Follow up as discussed.

## 2021-10-19 NOTE — Progress Notes (Signed)
Please let patient know that his microalbumin is elevated.  This indicates that his kidneys are putting a lot of protein in his urine.  The Trulicity I recommended will help protect the kidneys.  Please let me know if he has any questions.

## 2021-10-30 ENCOUNTER — Other Ambulatory Visit: Payer: Self-pay | Admitting: Nurse Practitioner

## 2021-10-30 NOTE — Telephone Encounter (Signed)
Requested Prescriptions  ?Pending Prescriptions Disp Refills  ?? rizatriptan (MAXALT) 10 MG tablet [Pharmacy Med Name: Rizatriptan Benzoate 10 MG Oral Tablet] 10 tablet 0  ?  Sig: Take 1 tablet by mouth twice daily as needed  ?  ? Neurology:  Migraine Therapy - Triptan Passed - 10/30/2021 11:59 AM  ?  ?  Passed - Last BP in normal range  ?  BP Readings from Last 1 Encounters:  ?10/15/21 129/82  ?   ?  ?  Passed - Valid encounter within last 12 months  ?  Recent Outpatient Visits   ?      ? 2 weeks ago Hyperlipidemia associated with type 2 diabetes mellitus ([place])  ? [place], NP  ? 3 months ago Annual physical exam  ? [place], NP  ? 9 months ago Hypertension associated with diabetes [facility])  ? [place], NP  ? 9 months ago Left lower quadrant abdominal pain  ? [place], NP  ? 1 year ago Ingrown toenail of right foot  ? [facility] Jon Billings, NP  ?  ?  ?Future Appointments   ?        ? In 2 months Jon Billings, NP [facility], [facility]  ?  ? ?  ?  ?  ? ? ?

## 2021-11-25 ENCOUNTER — Other Ambulatory Visit: Payer: Self-pay | Admitting: Nurse Practitioner

## 2021-11-25 NOTE — Telephone Encounter (Signed)
Requested Prescriptions  ?Pending Prescriptions Disp Refills  ?? TRULICITY 4.35 WY/6.1UO SOPN [Pharmacy Med Name: Trulicity 3.72 BM/2.1JD Subcutaneous Solution Pen-injector] 4 mL 0  ?  Sig: INJECT 0.'75MG'$  INTO THE SKIN ONCE PER WEEK  ?  ? Endocrinology:  Diabetes - GLP-1 Receptor Agonists Passed - 11/25/2021 12:17 PM  ?  ?  Passed - HBA1C is between 0 and 7.9 and within 180 days  ?  Hgb A1c MFr Bld  ?Date Value Ref Range Status  ?10/15/2021 7.9 (H) 4.8 - 5.6 % Final  ?  Comment:  ?  (NOTE) ?Pre diabetes:          5.7%-6.4% ? ?Diabetes:              >6.4% ? ?Glycemic control for   <7.0% ?adults with diabetes ?  ?   ?  ?  Passed - Valid encounter within last 6 months  ?  Recent Outpatient Visits   ?      ? 1 month ago Hyperlipidemia associated with type 2 diabetes mellitus ([place])  ? [place], NP  ? 4 months ago Annual physical exam  ? [place], NP  ? 10 months ago Hypertension associated with diabetes [facility])  ? [place], NP  ? 10 months ago Left lower quadrant abdominal pain  ? [place], NP  ? 1 year ago Ingrown toenail of right foot  ? [facility] Jon Billings, NP  ?  ?  ?Future Appointments   ?        ? In 1 month Jon Billings, NP [facility], [facility]  ?  ? ?  ?  ?  ?? rizatriptan (MAXALT) 10 MG tablet [Pharmacy Med Name: Rizatriptan Benzoate 10 MG Oral Tablet] 10 tablet 0  ?  Sig: Take 1 tablet by mouth twice daily as needed  ?  ? Neurology:  Migraine Therapy - Triptan Passed - 11/25/2021 12:17 PM  ?  ?  Passed - Last BP in normal range  ?  BP Readings from Last 1 Encounters:  ?10/15/21 129/82  ?   ?  ?  Passed - Valid encounter within last 12 months  ?  Recent Outpatient Visits   ?      ? 1 month ago Hyperlipidemia associated with type 2 diabetes mellitus ([place])  ? [place], NP  ? 4 months ago Annual physical exam  ?  [place], NP  ? 10 months ago Hypertension associated with diabetes [facility])  ? [place], NP  ? 10 months ago Left lower quadrant abdominal pain  ? [place], NP  ? 1 year ago Ingrown toenail of right foot  ? [facility] Jon Billings, NP  ?  ?  ?Future Appointments   ?        ? In 1 month Jon Billings, NP [facility], [facility]  ?  ? ?  ?  ?  ?? rosuvastatin (CRESTOR) 10 MG tablet [Pharmacy Med Name: Rosuvastatin Calcium 10 MG Oral Tablet] 90 tablet 0  ?  Sig: Take 1 tablet by mouth once daily  ?  ? Cardiovascular:  Antilipid - Statins 2 Failed - 11/25/2021 12:17 PM  ?  ?  Failed - Lipid Panel in normal range within the last 12 months  ?  Cholesterol, Total  ?Date Value Ref Range Status  ?02/23/2018  154 100 - 199 mg/dL Final  ? ?Cholesterol  ?Date Value Ref Range Status  ?10/15/2021 138 0 - 200 mg/dL Final  ? ?LDL Calculated  ?Date Value Ref Range Status  ?02/23/2018 57 0 - 99 mg/dL Final  ? ?LDL Cholesterol  ?Date Value Ref Range Status  ?10/15/2021 60 0 - 99 mg/dL Final  ?  Comment:  ?         ?Total Cholesterol/HDL:CHD Risk ?Coronary Heart Disease Risk Table ?                    Men   Women ? 1/2 Average Risk   3.4   3.3 ? Average Risk       5.0   4.4 ? 2 X Average Risk   9.6   7.1 ? 3 X Average Risk  23.4   11.0 ?       ?Use the calculated Patient Ratio ?above and the CHD Risk Table ?to determine the patient's CHD Risk. ?       ?ATP III CLASSIFICATION (LDL): ? <100     mg/dL   Optimal ? 100-129  mg/dL   Near or Above ?                   Optimal ? 130-159  mg/dL   Borderline ? 160-189  mg/dL   High ? >190     mg/dL   Very High ?Performed at [facility], [address], [place], [place] [address] ?  ? ?HDL  ?Date Value Ref Range Status  ?10/15/2021 39 (L) >40 mg/dL Final  ?02/23/2018 35 (L) >39 mg/dL Final  ? ?Triglycerides  ?Date Value Ref Range Status  ?10/15/2021 194 (H) <150  mg/dL Final  ? ?  ?  ?  Passed - Cr in normal range and within 360 days  ?  Creatinine, Ser  ?Date Value Ref Range Status  ?10/15/2021 0.76 0.61 - 1.24 mg/dL Final  ?   ?  ?  Passed - Patient is not pregnant  ?  ?  Passed - Valid encounter within last 12 months  ?  Recent Outpatient Visits   ?      ? 1 month ago Hyperlipidemia associated with type 2 diabetes mellitus ([place])  ? [place], NP  ? 4 months ago Annual physical exam  ? [place], NP  ? 10 months ago Hypertension associated with diabetes [facility])  ? [place], NP  ? 10 months ago Left lower quadrant abdominal pain  ? [place], NP  ? 1 year ago Ingrown toenail of right foot  ? [facility] Jon Billings, NP  ?  ?  ?Future Appointments   ?        ? In 1 month Jon Billings, NP [facility], [facility]  ?  ? ?  ?  ?  ? ? ?

## 2021-12-20 ENCOUNTER — Other Ambulatory Visit: Payer: Self-pay | Admitting: Nurse Practitioner

## 2021-12-22 NOTE — Telephone Encounter (Signed)
Requested Prescriptions  Pending Prescriptions Disp Refills  . metoprolol succinate (TOPROL-XL) 25 MG 24 hr tablet [Pharmacy Med Name: Metoprolol Succinate ER 25 MG Oral Tablet Extended Release 24 Hour] 90 tablet 0    Sig: Take 1 tablet by mouth once daily     Cardiovascular:  Beta Blockers Passed - 12/20/2021  6:09 PM      Passed - Last BP in normal range    BP Readings from Last 1 Encounters:  10/15/21 129/82         Passed - Last Heart Rate in normal range    Pulse Readings from Last 1 Encounters:  10/15/21 81         Passed - Valid encounter within last 6 months    Recent Outpatient Visits          2 months ago Hyperlipidemia associated with type 2 diabetes mellitus ([place])   [facility] Jon Billings, NP   5 months ago Annual physical exam   [facility] Jon Billings, NP   11 months ago Hypertension associated with diabetes [facility])   [facility] Jon Billings, NP   11 months ago Left lower quadrant abdominal pain   [facility] Jon Billings, NP   1 year ago Ingrown toenail of right foot   [facility] Jon Billings, NP      Future Appointments            In 3 weeks Jon Billings, NP [facility], [place]

## 2022-01-10 ENCOUNTER — Other Ambulatory Visit: Payer: Self-pay | Admitting: Nurse Practitioner

## 2022-01-14 NOTE — Progress Notes (Signed)
BP (!) 145/98   Pulse 83   Temp 97.9 F (36.6 C) (Oral)   Wt 193 lb 12.8 oz (87.9 kg)   SpO2 98%   BMI 29.21 kg/m    Subjective:    Patient ID: Keith Barber, male    DOB: August 17, 1970, 51 y.o.   MRN: [id number]  HPI: Keith Barber is a 51 y.o. male  Chief Complaint  Patient presents with   Hypertension    States recent BP adjustment , had a home health visit - would like to discuss BP options   Patient also states HX of lung cancer in family from smoking. Pt was a smoker for 25 years - would like to discuss cancer screening or discuss options   Also aksing for a stronger rx of aleve states a friend of his takes a stronger dose of aleve and It helps him/her, states the OTC is not helping like it used to. States it was diclofenac sodium- advised he had a rx for voltaren gel and he states "dont worry".    Hyperlipidemia   Diabetes   HYPERTENSION / HYPERLIPIDEMIA Satisfied with current treatment? no Duration of hypertension: years BP monitoring frequency: daily BP range: 130-140/90 BP medication side effects: no Past BP meds:  Metoprolol  Duration of hyperlipidemia: years Cholesterol medication side effects: no Cholesterol supplements: none Past cholesterol medications: Crestor Medication compliance: excellent compliance Aspirin: no Recent stressors: no Recurrent headaches: no Visual changes: no Palpitations: no Dyspnea: no Chest pain: no Lower extremity edema: no Dizzy/lightheaded: no  DIABETES Hypoglycemic episodes:no Polydipsia/polyuria: yes Visual disturbance: no Chest pain: no Paresthesias: no Glucose Monitoring: no  Accucheck frequency: Not Checking  Fasting glucose:  Post prandial:  Evening:  Before meals: Taking Insulin?: no  Long acting insulin:  Short acting insulin: Blood Pressure Monitoring: daily Retinal Examination: Up to Date Foot Exam: Up to Date Diabetic Education: Not Completed Pneumovax: Up to Date Influenza: Up to  Date Aspirin: no   Patient states he has been taking a lot of aleve recently for his lower back pain.  He would like to try the Voltaren Gel.   Relevant past medical, surgical, family and social history reviewed and updated as indicated. Interim medical history since our last visit reviewed. Allergies and medications reviewed and updated.  Review of Systems  Eyes:  Negative for visual disturbance.  Respiratory:  Negative for chest tightness and shortness of breath.   Cardiovascular:  Negative for chest pain, palpitations and leg swelling.  Endocrine: Negative for polydipsia and polyuria.  Neurological:  Negative for dizziness, light-headedness, numbness and headaches.    Per HPI unless specifically indicated above     Objective:    BP (!) 145/98   Pulse 83   Temp 97.9 F (36.6 C) (Oral)   Wt 193 lb 12.8 oz (87.9 kg)   SpO2 98%   BMI 29.21 kg/m   Wt Readings from Last 3 Encounters:  01/15/22 193 lb 12.8 oz (87.9 kg)  10/15/21 194 lb (88 kg)  07/16/21 185 lb 6.4 oz (84.1 kg)    Physical Exam Vitals and nursing note reviewed.  Constitutional:      General: He is not in acute distress.    Appearance: Normal appearance. He is not ill-appearing, toxic-appearing or diaphoretic.  HENT:     Head: Normocephalic.     Right Ear: External ear normal.     Left Ear: External ear normal.     Nose: Nose normal. No congestion or rhinorrhea.  Mouth/Throat:     Mouth: Mucous membranes are moist.  Eyes:     General:        Right eye: No discharge.        Left eye: No discharge.     Extraocular Movements: Extraocular movements intact.     Conjunctiva/sclera: Conjunctivae normal.     Pupils: Pupils are equal, round, and reactive to light.  Cardiovascular:     Rate and Rhythm: Normal rate and regular rhythm.     Heart sounds: No murmur heard. Pulmonary:     Effort: Pulmonary effort is normal. No respiratory distress.     Breath sounds: Normal breath sounds. No wheezing, rhonchi  or rales.  Abdominal:     General: Abdomen is flat. Bowel sounds are normal.  Musculoskeletal:     Cervical back: Normal range of motion and neck supple.  Skin:    General: Skin is warm and dry.     Capillary Refill: Capillary refill takes less than 2 seconds.  Neurological:     General: No focal deficit present.     Mental Status: He is alert and oriented to person, place, and time.  Psychiatric:        Mood and Affect: Mood normal.        Behavior: Behavior normal.        Thought Content: Thought content normal.        Judgment: Judgment normal.     Results for orders placed or performed in visit on 10/21/21  HM DIABETES EYE EXAM  Result Value Ref Range   HM Diabetic Eye Exam No Retinopathy No Retinopathy      Assessment & Plan:   Problem List Items Addressed This Visit       Cardiovascular and Mediastinum   Hypertension associated with diabetes ([place]) - Primary    Chronic. Not well controlled.  Has been taking Aleve for his back pain 2-3 times per day.  Discussed stopping aleve due to elevation in blood pressure. Will start Valsartan '40mg'$  daily to help with blood pressure control and kidney protection in the setting of diabetes.  Follow up in 1 month for reevaluation.       Relevant Medications   valsartan (DIOVAN) 40 MG tablet     Endocrine   Hyperlipidemia associated with type 2 diabetes mellitus (HCC)    Chronic.  Controlled.  Continue with current medication regimen on Crestor '10mg'$  daily.  Labs ordered today.  Return to clinic in 3 months for reevaluation.  Call sooner if concerns arise.        Relevant Medications   valsartan (DIOVAN) 40 MG tablet   Controlled diabetes mellitus type 2 with complications (HCC)    Chronic. Improved at last visit with an A1c 7.9%.  Will check labs today.  Not able to take the Farxiga due to it causing him to urinate too often.  Continue with Metformin and Trulicity.  Follow up in 3 months for reevaluation.       Relevant  Medications   valsartan (DIOVAN) 40 MG tablet     Follow up plan: Return in about 1 month (around 02/14/2022) for BP Check.

## 2022-01-15 ENCOUNTER — Ambulatory Visit (INDEPENDENT_AMBULATORY_CARE_PROVIDER_SITE_OTHER): Payer: 59 | Admitting: Nurse Practitioner

## 2022-01-15 ENCOUNTER — Encounter: Payer: Self-pay | Admitting: Nurse Practitioner

## 2022-01-15 VITALS — BP 145/98 | HR 83 | Temp 97.9°F | Wt 193.8 lb

## 2022-01-15 DIAGNOSIS — I152 Hypertension secondary to endocrine disorders: Secondary | ICD-10-CM | POA: Diagnosis not present

## 2022-01-15 DIAGNOSIS — E118 Type 2 diabetes mellitus with unspecified complications: Secondary | ICD-10-CM

## 2022-01-15 DIAGNOSIS — E785 Hyperlipidemia, unspecified: Secondary | ICD-10-CM

## 2022-01-15 DIAGNOSIS — E1159 Type 2 diabetes mellitus with other circulatory complications: Secondary | ICD-10-CM | POA: Diagnosis not present

## 2022-01-15 DIAGNOSIS — E1169 Type 2 diabetes mellitus with other specified complication: Secondary | ICD-10-CM

## 2022-01-15 MED ORDER — VALSARTAN 40 MG PO TABS: 40.0000 mg | ORAL_TABLET | Freq: Every day | ORAL | 1 refills | Status: DC

## 2022-01-15 NOTE — Assessment & Plan Note (Signed)
Chronic.  Controlled.  Continue with current medication regimen on Crestor '10mg'$  daily.  Labs ordered today.  Return to clinic in 3 months for reevaluation.  Call sooner if concerns arise.

## 2022-01-15 NOTE — Assessment & Plan Note (Signed)
Chronic. Not well controlled.  Has been taking Aleve for his back pain 2-3 times per day.  Discussed stopping aleve due to elevation in blood pressure. Will start Valsartan '40mg'$  daily to help with blood pressure control and kidney protection in the setting of diabetes.  Follow up in 1 month for reevaluation.

## 2022-01-15 NOTE — Assessment & Plan Note (Signed)
Chronic. Improved at last visit with an A1c 7.9%.  Will check labs today.  Not able to take the Farxiga due to it causing him to urinate too often.  Continue with Metformin and Trulicity.  Follow up in 3 months for reevaluation.

## 2022-01-18 ENCOUNTER — Other Ambulatory Visit
Admission: RE | Admit: 2022-01-18 | Discharge: 2022-01-18 | Disposition: A | Discharge status: Home / Self Care | Payer: 59 | Attending: Nurse Practitioner | Admitting: Nurse Practitioner

## 2022-01-18 ENCOUNTER — Ambulatory Visit: Payer: 59 | Admitting: Nurse Practitioner

## 2022-01-18 DIAGNOSIS — I152 Hypertension secondary to endocrine disorders: Secondary | ICD-10-CM | POA: Insufficient documentation

## 2022-01-18 DIAGNOSIS — E785 Hyperlipidemia, unspecified: Secondary | ICD-10-CM | POA: Insufficient documentation

## 2022-01-18 DIAGNOSIS — E118 Type 2 diabetes mellitus with unspecified complications: Secondary | ICD-10-CM | POA: Diagnosis not present

## 2022-01-18 DIAGNOSIS — E1159 Type 2 diabetes mellitus with other circulatory complications: Secondary | ICD-10-CM | POA: Diagnosis not present

## 2022-01-18 LAB — COMPREHENSIVE METABOLIC PANEL
ALT: 39 U/L (ref 0–44)
AST: 31 U/L (ref 15–41)
Albumin: 4.5 g/dL (ref 3.5–5.0)
Alkaline Phosphatase: 113 U/L (ref 38–126)
Anion gap: 6 (ref 5–15)
BUN: 10 mg/dL (ref 6–20)
CO2: 27 mmol/L (ref 22–32)
Calcium: 9.1 mg/dL (ref 8.9–10.3)
Chloride: 107 mmol/L (ref 98–111)
Creatinine, Ser: 0.92 mg/dL (ref 0.61–1.24)
GFR, Estimated: >60 mL/min (ref >60)
Glucose, Bld: 137 mg/dL — ABNORMAL HIGH (ref 70–99)
Potassium: 3.8 mmol/L (ref 3.5–5.1)
Sodium: 140 mmol/L (ref 135–145)
Total Bilirubin: 1.3 mg/dL — ABNORMAL HIGH (ref 0.3–1.2)
Total Protein: 7.8 g/dL (ref 6.5–8.1)

## 2022-01-18 LAB — LIPID PANEL
Cholesterol: 122 mg/dL (ref 0–200)
HDL: 40 mg/dL — ABNORMAL LOW (ref >40)
LDL Cholesterol: 68 mg/dL (ref 0–99)
Total CHOL/HDL Ratio: 3.1 RATIO
Triglycerides: 72 mg/dL (ref <150)
VLDL: 14 mg/dL (ref 0–40)

## 2022-01-18 LAB — HEMOGLOBIN A1C
Hgb A1c MFr Bld: 6.9 % — ABNORMAL HIGH (ref 4.8–5.6)
Mean Plasma Glucose: 151.33 mg/dL

## 2022-01-18 NOTE — Progress Notes (Signed)
Hi Keith Barber. Your lab work shows that your A1c improved to 6.9.  Keep up the good work.  Your liver, kidneys and electrolytes look good.  No other concerns at this time.  Follow up as discussed.

## 2022-02-08 ENCOUNTER — Other Ambulatory Visit: Payer: Self-pay | Admitting: Nurse Practitioner

## 2022-02-10 NOTE — Telephone Encounter (Signed)
Requested Prescriptions  Pending Prescriptions Disp Refills  . rizatriptan (MAXALT) 10 MG tablet [Pharmacy Med Name: Rizatriptan Benzoate 10 MG Oral Tablet] 180 tablet 0    Sig: Take 1 tablet by mouth twice daily as needed     Neurology:  Migraine Therapy - Triptan Failed - 02/08/2022  8:43 PM      Failed - Last BP in normal range    BP Readings from Last 1 Encounters:  01/15/22 (!) 145/98         Passed - Valid encounter within last 12 months    Recent Outpatient Visits          3 weeks ago Hypertension associated with diabetes ([place])   [facility] Jon Billings, NP   3 months ago Hyperlipidemia associated with type 2 diabetes mellitus ([place])   [facility] Jon Billings, NP   6 months ago Annual physical exam   [facility] Jon Billings, NP   1 year ago Hypertension associated with diabetes [facility])   [facility] Jon Billings, NP   1 year ago Left lower quadrant abdominal pain   [facility] Jon Billings, NP      Future Appointments            In 1 week Jon Billings, NP [facility] [place]

## 2022-02-12 ENCOUNTER — Ambulatory Visit: Payer: 59 | Admitting: Nurse Practitioner

## 2022-02-17 ENCOUNTER — Ambulatory Visit (INDEPENDENT_AMBULATORY_CARE_PROVIDER_SITE_OTHER): Payer: 59 | Admitting: Nurse Practitioner

## 2022-02-17 ENCOUNTER — Encounter: Payer: Self-pay | Admitting: Nurse Practitioner

## 2022-02-17 VITALS — BP 126/80 | HR 70 | Temp 98.0°F | Wt 196.5 lb

## 2022-02-17 DIAGNOSIS — I152 Hypertension secondary to endocrine disorders: Secondary | ICD-10-CM

## 2022-02-17 DIAGNOSIS — M542 Cervicalgia: Secondary | ICD-10-CM

## 2022-02-17 DIAGNOSIS — E1159 Type 2 diabetes mellitus with other circulatory complications: Secondary | ICD-10-CM | POA: Diagnosis not present

## 2022-02-17 MED ORDER — DICLOFENAC SODIUM 1 % EX GEL: 2.0000 g | Freq: Four times a day (QID) | CUTANEOUS | 1 refills | Status: DC

## 2022-02-17 MED ORDER — CYCLOBENZAPRINE HCL 5 MG PO TABS: 5.0000 mg | ORAL_TABLET | Freq: Every day | ORAL | 0 refills | Status: DC

## 2022-02-17 NOTE — Progress Notes (Signed)
BP 126/80   Pulse 70   Temp 98 F (36.7 C) (Oral)   Wt 196 lb 8 oz (89.1 kg)   SpO2 96%   BMI 29.62 kg/m    Subjective:    Patient ID: Keith Barber, male    DOB: 08-Oct-1970, 51 y.o.   MRN: [id number]  HPI: Keith Barber is a 51 y.o. male  Chief Complaint  Patient presents with   Hypertension    1 month follow up   Patient states having neck pains x 1 week, towards base of neck. Experiencing HA recently.   Patient would like refill on diclofenac 1% gel for his back    HYPERTENSION Hypertension status: controlled  Satisfied with current treatment? no Duration of hypertension: years BP monitoring frequency:  daily BP range: 120/70-80 BP medication side effects:  no Medication compliance: excellent compliance Previous BP meds:valsartan Aspirin: no Recurrent headaches: no Visual changes: no Palpitations: no Dyspnea: no Chest pain: no Lower extremity edema: no Dizzy/lightheaded: yes    Relevant past medical, surgical, family and social history reviewed and updated as indicated. Interim medical history since our last visit reviewed. Allergies and medications reviewed and updated.  Review of Systems  Eyes:  Negative for visual disturbance.  Respiratory:  Negative for shortness of breath.   Cardiovascular:  Negative for chest pain and leg swelling.  Musculoskeletal:  Positive for neck pain.  Neurological:  Negative for light-headedness and headaches.    Per HPI unless specifically indicated above     Objective:    BP 126/80   Pulse 70   Temp 98 F (36.7 C) (Oral)   Wt 196 lb 8 oz (89.1 kg)   SpO2 96%   BMI 29.62 kg/m   Wt Readings from Last 3 Encounters:  02/17/22 196 lb 8 oz (89.1 kg)  01/15/22 193 lb 12.8 oz (87.9 kg)  10/15/21 194 lb (88 kg)    Physical Exam Vitals and nursing note reviewed.  Constitutional:      General: He is not in acute distress.    Appearance: Normal appearance. He is not ill-appearing, toxic-appearing or  diaphoretic.  HENT:     Head: Normocephalic.     Right Ear: External ear normal.     Left Ear: External ear normal.     Nose: Nose normal. No congestion or rhinorrhea.     Mouth/Throat:     Mouth: Mucous membranes are moist.  Eyes:     General:        Right eye: No discharge.        Left eye: No discharge.     Extraocular Movements: Extraocular movements intact.     Conjunctiva/sclera: Conjunctivae normal.     Pupils: Pupils are equal, round, and reactive to light.  Cardiovascular:     Rate and Rhythm: Normal rate and regular rhythm.     Heart sounds: No murmur heard. Pulmonary:     Effort: Pulmonary effort is normal. No respiratory distress.     Breath sounds: Normal breath sounds. No wheezing, rhonchi or rales.  Abdominal:     General: Abdomen is flat. Bowel sounds are normal.  Musculoskeletal:       Arms:     Cervical back: Normal range of motion and neck supple.  Skin:    General: Skin is warm and dry.     Capillary Refill: Capillary refill takes less than 2 seconds.  Neurological:     General: No focal deficit present.     Mental  Status: He is alert and oriented to person, place, and time.  Psychiatric:        Mood and Affect: Mood normal.        Behavior: Behavior normal.        Thought Content: Thought content normal.        Judgment: Judgment normal.     Results for orders placed or performed during the hospital encounter of 01/18/22  Hemoglobin A1c  Result Value Ref Range   Hgb A1c MFr Bld 6.9 (H) 4.8 - 5.6 %   Mean Plasma Glucose 151.33 mg/dL  Lipid panel  Result Value Ref Range   Cholesterol 122 0 - 200 mg/dL   Triglycerides 72 <150 mg/dL   HDL 40 (L) >40 mg/dL   Total CHOL/HDL Ratio 3.1 RATIO   VLDL 14 0 - 40 mg/dL   LDL Cholesterol 68 0 - 99 mg/dL  Comprehensive metabolic panel  Result Value Ref Range   Sodium 140 135 - 145 mmol/L   Potassium 3.8 3.5 - 5.1 mmol/L   Chloride 107 98 - 111 mmol/L   CO2 27 22 - 32 mmol/L   Glucose, Bld 137 (H) 70 -  99 mg/dL   BUN 10 6 - 20 mg/dL   Creatinine, Ser 0.92 0.61 - 1.24 mg/dL   Calcium 9.1 8.9 - 10.3 mg/dL   Total Protein 7.8 6.5 - 8.1 g/dL   Albumin 4.5 3.5 - 5.0 g/dL   AST 31 15 - 41 U/L   ALT 39 0 - 44 U/L   Alkaline Phosphatase 113 38 - 126 U/L   Total Bilirubin 1.3 (H) 0.3 - 1.2 mg/dL   GFR, Estimated >60 >60 mL/min   Anion gap 6 5 - 15      Assessment & Plan:   Problem List Items Addressed This Visit       Cardiovascular and Mediastinum   Hypertension associated with diabetes ([place]) - Primary    Chronic.  Controlled.  Continue with current medication regimen of Valsartan '40mg'$ .  Cut the Metoprolol in half due to dizziness.  Return to clinic in 2 months for reevaluation.  Call sooner if concerns arise.        Other Visit Diagnoses     Neck pain       Recommend using diclofenac gel prn for pain.  Flexeril given. Side effects and benefits given during visit.  Follow up is symptoms do not improve.        Follow up plan: Return in about 2 months (around 04/19/2022) for HTN, HLD, DM2 FU.

## 2022-02-17 NOTE — Assessment & Plan Note (Signed)
Chronic.  Controlled.  Continue with current medication regimen of Valsartan '40mg'$ .  Cut the Metoprolol in half due to dizziness.  Return to clinic in 2 months for reevaluation.  Call sooner if concerns arise.

## 2022-02-27 ENCOUNTER — Other Ambulatory Visit: Payer: Self-pay | Admitting: Nurse Practitioner

## 2022-03-01 NOTE — Telephone Encounter (Signed)
Requested Prescriptions  Pending Prescriptions Disp Refills  . rosuvastatin (CRESTOR) 10 MG tablet [Pharmacy Med Name: Rosuvastatin Calcium 10 MG Oral Tablet] 90 tablet 3    Sig: Take 1 tablet by mouth once daily     Cardiovascular:  Antilipid - Statins 2 Failed - 02/27/2022 10:01 AM      Failed - Lipid Panel in normal range within the last 12 months    Cholesterol, Total  Date Value Ref Range Status  02/23/2018 154 100 - 199 mg/dL Final   Cholesterol  Date Value Ref Range Status  01/18/2022 122 0 - 200 mg/dL Final   LDL Calculated  Date Value Ref Range Status  02/23/2018 57 0 - 99 mg/dL Final   LDL Cholesterol  Date Value Ref Range Status  01/18/2022 68 0 - 99 mg/dL Final    Comment:           Total Cholesterol/HDL:CHD Risk Coronary Heart Disease Risk Table                     Men   Women  1/2 Average Risk   3.4   3.3  Average Risk       5.0   4.4  2 X Average Risk   9.6   7.1  3 X Average Risk  23.4   11.0        Use the calculated Patient Ratio above and the CHD Risk Table to determine the patient's CHD Risk.        ATP III CLASSIFICATION (LDL):  <100     mg/dL   Optimal  100-129  mg/dL   Near or Above                    Optimal  130-159  mg/dL   Borderline  160-189  mg/dL   High  >190     mg/dL   Very High Performed at [facility], [place], [place] [address]    HDL  Date Value Ref Range Status  01/18/2022 40 (L) >40 mg/dL Final  02/23/2018 35 (L) >39 mg/dL Final   Triglycerides  Date Value Ref Range Status  01/18/2022 72 <150 mg/dL Final         Passed - Cr in normal range and within 360 days    Creatinine, Ser  Date Value Ref Range Status  01/18/2022 0.92 0.61 - 1.24 mg/dL Final         Passed - Patient is not pregnant      Passed - Valid encounter within last 12 months    Recent Outpatient Visits          1 week ago Hypertension associated with diabetes ([place])   [facility] Jon Billings, NP   1  month ago Hypertension associated with diabetes ([place])   [facility] Jon Billings, NP   4 months ago Hyperlipidemia associated with type 2 diabetes mellitus ([place])   [facility] Jon Billings, NP   7 months ago Annual physical exam   [facility] Jon Billings, NP   1 year ago Hypertension associated with diabetes [facility])   [place] Jon Billings, NP      Future Appointments            In 1 month Jon Billings, NP [facility], [place]

## 2022-03-24 ENCOUNTER — Telehealth: Payer: Self-pay | Admitting: Nurse Practitioner

## 2022-03-24 NOTE — Telephone Encounter (Signed)
Patient dropped off DMV forms for medical review to be completed by PCP. Patient has scheduled an appointment for 03/30/2022 if needed to get these forms completed. If an appointment is not needed let me know so that I can cancel that appointment. Patient would like to get a call when forms are completed so that he can pick them up.

## 2022-03-24 NOTE — Telephone Encounter (Signed)
Keith Barber wants to see him in the office tomorrow, so he needs to keep his appt. Please !

## 2022-03-24 NOTE — Telephone Encounter (Signed)
Thank you :)

## 2022-03-25 ENCOUNTER — Ambulatory Visit (INDEPENDENT_AMBULATORY_CARE_PROVIDER_SITE_OTHER): Payer: 59 | Admitting: Nurse Practitioner

## 2022-03-25 ENCOUNTER — Encounter: Payer: Self-pay | Admitting: Nurse Practitioner

## 2022-03-25 VITALS — BP 126/82 | HR 87 | Temp 98.4°F | Wt 199.4 lb

## 2022-03-25 DIAGNOSIS — Z0289 Encounter for other administrative examinations: Secondary | ICD-10-CM

## 2022-03-25 DIAGNOSIS — E119 Type 2 diabetes mellitus without complications: Secondary | ICD-10-CM | POA: Diagnosis not present
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
## 2022-03-30 ENCOUNTER — Ambulatory Visit: Payer: 59 | Admitting: Nurse Practitioner

## 2022-04-11 ENCOUNTER — Telehealth: Payer: 59 | Admitting: Physician Assistant

## 2022-04-11 DIAGNOSIS — R197 Diarrhea, unspecified: Secondary | ICD-10-CM

## 2022-04-12 NOTE — Progress Notes (Signed)
Because you are having dark stools, lightheadedness, and BP decreasing, I feel your condition warrants further evaluation and I recommend that you be seen in a face to face visit. It is possible you may be having acute GI bleed and should be evaluated in person to have this ruled out.   NOTE: There will be NO CHARGE for this eVisit   If you are having a true medical emergency please call 911.      For an urgent face to face visit, [place] has seven urgent care centers for your convenience:     [facility] [place] [facility] [place] Get Driving Directions [phone] [place], [place] [address]    [facility] [place] [facility]) Get Driving Directions [phone] [place], [place] [address]  [facility] [place] ([place]) Get Driving Directions [phone] [address] [place],  [place]  [address]  [place] [place] Get Driving Directions  [phone] [address] [place],  [place] [address]   [facility] Get Driving Directions [phone] [place], [place], [place] [address]   [facility] Get Driving Directions  [phone] [place], [place] [address]   [facility] [place] Get Driving Directions [phone] [address] [place], [place] [address]  Your MyChart E-visit questionnaire answers were reviewed by a board certified advanced clinical practitioner to complete your personal care plan based on your specific symptoms.  Thank you for using e-Visits.   I provided 5 minutes of non face-to-face time during this encounter for chart review and documentation.

## 2022-04-19 ENCOUNTER — Other Ambulatory Visit: Payer: Self-pay | Admitting: Nurse Practitioner

## 2022-04-19 NOTE — Telephone Encounter (Signed)
Requested Prescriptions  Pending Prescriptions Disp Refills  . TRULICITY 9.38 HW/2.9HB SOPN [Pharmacy Med Name: Trulicity 7.16 RC/7.8LF Subcutaneous Solution Pen-injector] 8 mL 0    Sig: INJECT 0.75 MG SUBCUTANEOUSLY ONCE A WEEK     Endocrinology:  Diabetes - GLP-1 Receptor Agonists Passed - 04/19/2022  9:01 AM      Passed - HBA1C is between 0 and 7.9 and within 180 days    Hgb A1c MFr Bld  Date Value Ref Range Status  01/18/2022 6.9 (H) 4.8 - 5.6 % Final    Comment:    (NOTE) Pre diabetes:          5.7%-6.4%  Diabetes:              >6.4%  Glycemic control for   <7.0% adults with diabetes          Passed - Valid encounter within last 6 months    Recent Outpatient Visits          3 weeks ago Type 2 diabetes mellitus without complication, without long-term current use of insulin ([place])   [place], Santiago Glad, NP   2 months ago Hypertension associated with diabetes ([place])   [facility] Jon Billings, NP   3 months ago Hypertension associated with diabetes ([place])   [facility] Jon Billings, NP   6 months ago Hyperlipidemia associated with type 2 diabetes mellitus ([place])   [facility] Jon Billings, NP   9 months ago Annual physical exam   [facility] Jon Billings, NP      Future Appointments            In 1 week Jon Billings, NP [facility] [place]

## 2022-04-21 ENCOUNTER — Ambulatory Visit: Payer: Self-pay | Admitting: *Deleted
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
## 2022-04-22 ENCOUNTER — Ambulatory Visit: Payer: 59 | Admitting: Physician Assistant

## 2022-04-23 ENCOUNTER — Ambulatory Visit
Admission: RE | Admit: 2022-04-23 | Discharge: 2022-04-23 | Disposition: A | Discharge status: Home / Self Care | Payer: 59 | Source: Ambulatory Visit | Attending: Physician Assistant | Admitting: Physician Assistant

## 2022-04-23 ENCOUNTER — Ambulatory Visit (INDEPENDENT_AMBULATORY_CARE_PROVIDER_SITE_OTHER): Payer: 59 | Admitting: Physician Assistant

## 2022-04-23 ENCOUNTER — Encounter: Payer: Self-pay | Admitting: Physician Assistant

## 2022-04-23 VITALS — BP 125/85 | HR 79 | Temp 98.4°F | Ht 68.31 in | Wt 196.4 lb

## 2022-04-23 DIAGNOSIS — R079 Chest pain, unspecified: Secondary | ICD-10-CM

## 2022-04-23 NOTE — Patient Instructions (Signed)
Please go here for your  xray   [address] [place], [place] [address]    You can alternate Tylenol and Ibuprofen (or preferred NSAID) to assist with discomfort and pain  Please continue to take deep breaths and try to fully inflate your lungs.
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
## 2022-04-26 ENCOUNTER — Other Ambulatory Visit: Payer: Self-pay | Admitting: Nurse Practitioner

## 2022-04-26 NOTE — Progress Notes (Unsigned)
There were no vitals taken for this visit.   Subjective:    Patient ID: Keith Barber, male    DOB: 04-18-1971, 51 y.o.   MRN: [id number]  HPI: Keith Barber is a 51 y.o. male  No chief complaint on file.  HYPERTENSION / HYPERLIPIDEMIA Satisfied with current treatment? no Duration of hypertension: years BP monitoring frequency: daily BP range: 130-140/90 BP medication side effects: no Past BP meds:  Metoprolol  Duration of hyperlipidemia: years Cholesterol medication side effects: no Cholesterol supplements: none Past cholesterol medications: Crestor Medication compliance: excellent compliance Aspirin: no Recent stressors: no Recurrent headaches: no Visual changes: no Palpitations: no Dyspnea: no Chest pain: no Lower extremity edema: no Dizzy/lightheaded: no  DIABETES Hypoglycemic episodes:no Polydipsia/polyuria: yes Visual disturbance: no Chest pain: no Paresthesias: no Glucose Monitoring: no  Accucheck frequency: Not Checking  Fasting glucose:  Post prandial:  Evening:  Before meals: Taking Insulin?: no  Long acting insulin:  Short acting insulin: Blood Pressure Monitoring: daily Retinal Examination: Up to Date Foot Exam: Up to Date Diabetic Education: Not Completed Pneumovax: Up to Date Influenza: Up to Date Aspirin: no   Patient states he has been taking a lot of aleve recently for his lower back pain.  He would like to try the Voltaren Gel.   Relevant past medical, surgical, family and social history reviewed and updated as indicated. Interim medical history since our last visit reviewed. Allergies and medications reviewed and updated.  Review of Systems  Eyes:  Negative for visual disturbance.  Respiratory:  Negative for chest tightness and shortness of breath.   Cardiovascular:  Negative for chest pain, palpitations and leg swelling.  Endocrine: Negative for polydipsia and polyuria.  Neurological:  Negative for dizziness,  light-headedness, numbness and headaches.    Per HPI unless specifically indicated above     Objective:    There were no vitals taken for this visit.  Wt Readings from Last 3 Encounters:  04/23/22 196 lb 6.4 oz (89.1 kg)  03/25/22 199 lb 6.4 oz (90.4 kg)  02/17/22 196 lb 8 oz (89.1 kg)    Physical Exam Vitals and nursing note reviewed.  Constitutional:      General: He is not in acute distress.    Appearance: Normal appearance. He is not ill-appearing, toxic-appearing or diaphoretic.  HENT:     Head: Normocephalic.     Right Ear: External ear normal.     Left Ear: External ear normal.     Nose: Nose normal. No congestion or rhinorrhea.     Mouth/Throat:     Mouth: Mucous membranes are moist.  Eyes:     General:        Right eye: No discharge.        Left eye: No discharge.     Extraocular Movements: Extraocular movements intact.     Conjunctiva/sclera: Conjunctivae normal.     Pupils: Pupils are equal, round, and reactive to light.  Cardiovascular:     Rate and Rhythm: Normal rate and regular rhythm.     Heart sounds: No murmur heard. Pulmonary:     Effort: Pulmonary effort is normal. No respiratory distress.     Breath sounds: Normal breath sounds. No wheezing, rhonchi or rales.  Abdominal:     General: Abdomen is flat. Bowel sounds are normal.  Musculoskeletal:     Cervical back: Normal range of motion and neck supple.  Skin:    General: Skin is warm and dry.     Capillary  Refill: Capillary refill takes less than 2 seconds.  Neurological:     General: No focal deficit present.     Mental Status: He is alert and oriented to person, place, and time.  Psychiatric:        Mood and Affect: Mood normal.        Behavior: Behavior normal.        Thought Content: Thought content normal.        Judgment: Judgment normal.    Results for orders placed or performed during the hospital encounter of 01/18/22  Hemoglobin A1c  Result Value Ref Range   Hgb A1c MFr Bld 6.9  (H) 4.8 - 5.6 %   Mean Plasma Glucose 151.33 mg/dL  Lipid panel  Result Value Ref Range   Cholesterol 122 0 - 200 mg/dL   Triglycerides 72 <150 mg/dL   HDL 40 (L) >40 mg/dL   Total CHOL/HDL Ratio 3.1 RATIO   VLDL 14 0 - 40 mg/dL   LDL Cholesterol 68 0 - 99 mg/dL  Comprehensive metabolic panel  Result Value Ref Range   Sodium 140 135 - 145 mmol/L   Potassium 3.8 3.5 - 5.1 mmol/L   Chloride 107 98 - 111 mmol/L   CO2 27 22 - 32 mmol/L   Glucose, Bld 137 (H) 70 - 99 mg/dL   BUN 10 6 - 20 mg/dL   Creatinine, Ser 0.92 0.61 - 1.24 mg/dL   Calcium 9.1 8.9 - 10.3 mg/dL   Total Protein 7.8 6.5 - 8.1 g/dL   Albumin 4.5 3.5 - 5.0 g/dL   AST 31 15 - 41 U/L   ALT 39 0 - 44 U/L   Alkaline Phosphatase 113 38 - 126 U/L   Total Bilirubin 1.3 (H) 0.3 - 1.2 mg/dL   GFR, Estimated >60 >60 mL/min   Anion gap 6 5 - 15      Assessment & Plan:   Problem List Items Addressed This Visit      Cardiovascular and Mediastinum   Hypertension associated with diabetes ([place]) - Primary     Endocrine   Hyperlipidemia associated with type 2 diabetes mellitus ([place])   Controlled diabetes mellitus type 2 with complications ([place])     Follow up plan: No follow-ups on file.

## 2022-04-27 ENCOUNTER — Other Ambulatory Visit
Admission: RE | Admit: 2022-04-27 | Discharge: 2022-04-27 | Disposition: A | Discharge status: Home / Self Care | Payer: 59 | Attending: Nurse Practitioner | Admitting: Nurse Practitioner

## 2022-04-27 ENCOUNTER — Encounter: Payer: Self-pay | Admitting: Nurse Practitioner

## 2022-04-27 ENCOUNTER — Ambulatory Visit: Payer: 59 | Admitting: Nurse Practitioner

## 2022-04-27 VITALS — BP 126/89 | HR 79 | Temp 98.2°F | Wt 199.2 lb

## 2022-04-27 DIAGNOSIS — E1169 Type 2 diabetes mellitus with other specified complication: Secondary | ICD-10-CM

## 2022-04-27 DIAGNOSIS — R5383 Other fatigue: Secondary | ICD-10-CM | POA: Diagnosis not present

## 2022-04-27 DIAGNOSIS — I152 Hypertension secondary to endocrine disorders: Secondary | ICD-10-CM

## 2022-04-27 DIAGNOSIS — E1159 Type 2 diabetes mellitus with other circulatory complications: Secondary | ICD-10-CM | POA: Diagnosis not present

## 2022-04-27 DIAGNOSIS — E785 Hyperlipidemia, unspecified: Secondary | ICD-10-CM

## 2022-04-27 DIAGNOSIS — E118 Type 2 diabetes mellitus with unspecified complications: Secondary | ICD-10-CM

## 2022-04-27 LAB — COMPREHENSIVE METABOLIC PANEL
ALT: 41 U/L (ref 0–44)
AST: 29 U/L (ref 15–41)
Albumin: 4.2 g/dL (ref 3.5–5.0)
Alkaline Phosphatase: 109 U/L (ref 38–126)
Anion gap: 4 — ABNORMAL LOW (ref 5–15)
BUN: 16 mg/dL (ref 6–20)
CO2: 27 mmol/L (ref 22–32)
Calcium: 8.6 mg/dL — ABNORMAL LOW (ref 8.9–10.3)
Chloride: 109 mmol/L (ref 98–111)
Creatinine, Ser: 0.91 mg/dL (ref 0.61–1.24)
GFR, Estimated: >60 mL/min (ref >60)
Glucose, Bld: 105 mg/dL — ABNORMAL HIGH (ref 70–99)
Potassium: 4.1 mmol/L (ref 3.5–5.1)
Sodium: 140 mmol/L (ref 135–145)
Total Bilirubin: 0.7 mg/dL (ref 0.3–1.2)
Total Protein: 7.6 g/dL (ref 6.5–8.1)

## 2022-04-27 LAB — LIPID PANEL
Cholesterol: 121 mg/dL (ref 0–200)
HDL: 36 mg/dL — ABNORMAL LOW (ref >40)
LDL Cholesterol: 68 mg/dL (ref 0–99)
Total CHOL/HDL Ratio: 3.4 RATIO
Triglycerides: 86 mg/dL (ref <150)
VLDL: 17 mg/dL (ref 0–40)

## 2022-04-27 LAB — HEMOGLOBIN A1C
Hgb A1c MFr Bld: 5.6 % (ref 4.8–5.6)
Mean Plasma Glucose: 114.02 mg/dL

## 2022-04-27 MED ORDER — METOPROLOL SUCCINATE ER 25 MG PO TB24: 25.0000 mg | ORAL_TABLET | Freq: Every day | ORAL | 1 refills | Status: DC

## 2022-04-27 MED ORDER — METFORMIN HCL 500 MG PO TABS: 500.0000 mg | ORAL_TABLET | Freq: Two times a day (BID) | ORAL | 1 refills | Status: DC

## 2022-04-27 MED ORDER — VALSARTAN 40 MG PO TABS: 40.0000 mg | ORAL_TABLET | Freq: Every day | ORAL | 1 refills | Status: DC

## 2022-04-27 NOTE — Assessment & Plan Note (Signed)
Chronic.  Controlled.  Continue with current medication regimen of Metoprolol and '25mg'$  and Valsartan '40mg'$ .   Return to clinic in 3 months for reevaluation.  Call sooner if concerns arise.

## 2022-04-27 NOTE — Telephone Encounter (Signed)
rx was sent to pharmacy today by provider.   Requested Prescriptions  Pending Prescriptions Disp Refills  . metoprolol succinate (TOPROL-XL) 25 MG 24 hr tablet [Pharmacy Med Name: Metoprolol Succinate ER 25 MG Oral Tablet Extended Release 24 Hour] 90 tablet 0    Sig: Take 1 tablet by mouth once daily     Cardiovascular:  Beta Blockers Passed - 04/26/2022  4:04 PM      Passed - Last BP in normal range    BP Readings from Last 1 Encounters:  04/27/22 126/89         Passed - Last Heart Rate in normal range    Pulse Readings from Last 1 Encounters:  04/27/22 79         Passed - Valid encounter within last 6 months    Recent Outpatient Visits          Today Hypertension associated with diabetes ([place])   [facility] Jon Billings, NP   4 days ago Traumatic chest pain   [facility] Mecum, Erin E, PA-C   1 month ago Type 2 diabetes mellitus without complication, without long-term current use of insulin ([place])   [place], Santiago Glad, NP   2 months ago Hypertension associated with diabetes [facility])   [facility] Jon Billings, NP   3 months ago Hypertension associated with diabetes [facility])   [place] Jon Billings, NP      Future Appointments            In 3 months Jon Billings, NP [facility] [place]

## 2022-04-27 NOTE — Assessment & Plan Note (Signed)
Chronic.  Controlled.  Continue with current medication regimen on Crestor '10mg'$  daily.  Labs ordered today.  Return to clinic in 3 months for reevaluation.  Call sooner if concerns arise.

## 2022-04-27 NOTE — Assessment & Plan Note (Signed)
Chronic. Improved at last visit with an A1c 6.9%.  Will check labs today.  Not able to take the Farxiga due to it causing him to urinate too often.  Continue with Metformin and Trulicity.  Follow up in 3 months for reevaluation. Call sooner if concerns arise.

## 2022-04-28 ENCOUNTER — Telehealth: Payer: Self-pay | Admitting: Nurse Practitioner

## 2022-04-28 ENCOUNTER — Encounter: Payer: Self-pay | Admitting: Nurse Practitioner

## 2022-04-28 ENCOUNTER — Other Ambulatory Visit
Admission: RE | Admit: 2022-04-28 | Discharge: 2022-04-28 | Disposition: A | Discharge status: Home / Self Care | Payer: 59 | Attending: Nurse Practitioner | Admitting: Nurse Practitioner

## 2022-04-28 DIAGNOSIS — I152 Hypertension secondary to endocrine disorders: Secondary | ICD-10-CM | POA: Insufficient documentation

## 2022-04-28 DIAGNOSIS — E1159 Type 2 diabetes mellitus with other circulatory complications: Secondary | ICD-10-CM | POA: Insufficient documentation

## 2022-04-28 DIAGNOSIS — E1169 Type 2 diabetes mellitus with other specified complication: Secondary | ICD-10-CM | POA: Diagnosis not present

## 2022-04-28 DIAGNOSIS — D751 Secondary polycythemia: Secondary | ICD-10-CM

## 2022-04-28 DIAGNOSIS — R5383 Other fatigue: Secondary | ICD-10-CM | POA: Insufficient documentation

## 2022-04-28 DIAGNOSIS — E785 Hyperlipidemia, unspecified: Secondary | ICD-10-CM | POA: Diagnosis not present

## 2022-04-28 LAB — CBC WITH DIFFERENTIAL/PLATELET
Abs Immature Granulocytes: 0.03 10*3/uL (ref 0.00–0.07)
Basophils Absolute: 0.1 10*3/uL (ref 0.0–0.1)
Basophils Relative: 1 %
Eosinophils Absolute: 0.1 10*3/uL (ref 0.0–0.5)
Eosinophils Relative: 1 %
HCT: 55 % — ABNORMAL HIGH (ref 39.0–52.0)
Hemoglobin: 18.5 g/dL — ABNORMAL HIGH (ref 13.0–17.0)
Immature Granulocytes: 0 %
Lymphocytes Relative: 31 %
Lymphs Abs: 3 10*3/uL (ref 0.7–4.0)
MCH: 29.5 pg (ref 26.0–34.0)
MCHC: 33.6 g/dL (ref 30.0–36.0)
MCV: 87.7 fL (ref 80.0–100.0)
Monocytes Absolute: 0.9 10*3/uL (ref 0.1–1.0)
Monocytes Relative: 9 %
Neutro Abs: 5.7 10*3/uL (ref 1.7–7.7)
Neutrophils Relative %: 58 %
Platelets: 230 10*3/uL (ref 150–400)
RBC: 6.27 MIL/uL — ABNORMAL HIGH (ref 4.22–5.81)
RDW: 12.5 % (ref 11.5–15.5)
WBC: 9.7 10*3/uL (ref 4.0–10.5)
nRBC: 0 % (ref 0.0–0.2)

## 2022-04-28 NOTE — Telephone Encounter (Signed)
Orders placed.

## 2022-04-28 NOTE — Telephone Encounter (Signed)
Copied from [place] [id number]. Topic: General - Inquiry >> Apr 28, 2022  3:06 PM Marcellus Scott wrote: Reason for CRM: Levada Dy from [facility] stated pt did not have labs done yesterday because, according to their system, the order was canceled for Vitamin D and CBC.  Levada Dy is asking for a new order to be put in for Vitamin D and CBC. They will go ahead and draw, but for some reason it was canceled, and they need a new order.  Please advise.

## 2022-04-28 NOTE — Progress Notes (Signed)
Hi Keith Barber. It was nice to see you yesterday.  Your lab work looks good.  Your A1c is well controlled at 5.6.  Keep up the good work.  Your triglycerides have improved also.  No concerns at this time. Continue with your current medication regimen.  Follow up as discussed.  Please let me know if you have any questions.

## 2022-04-30 NOTE — Addendum Note (Signed)
Addended by: Jon Billings on: 04/30/2022 08:50 AM   Modules accepted: Orders

## 2022-04-30 NOTE — Progress Notes (Signed)
Hi Keith Barber.  Your lab work shows that you aren't anemic. However, your blood is thicker than normal.  I recommend you see Hematology for further evaluation.  I still didn't get a vitamin D level.  I don't recommend starting a supplement until we are able to see what your Vitamin D is.  If you agree to seeing hematology I will place the referral.

## 2022-05-06 LAB — VITAMIN D 1,25 DIHYDROXY
Vitamin D 1, 25 (OH)2 Total: 63 pg/mL
Vitamin D2 1, 25 (OH)2: <10 pg/mL
Vitamin D3 1, 25 (OH)2: 60 pg/mL

## 2022-05-07 ENCOUNTER — Inpatient Hospital Stay: Payer: 59

## 2022-05-07 ENCOUNTER — Inpatient Hospital Stay: Payer: 59 | Attending: Oncology | Admitting: Oncology

## 2022-05-07 ENCOUNTER — Encounter: Payer: Self-pay | Admitting: Oncology

## 2022-05-07 VITALS — BP 125/93 | HR 72 | Temp 97.7°F | Resp 18 | Wt 197.1 lb

## 2022-05-07 DIAGNOSIS — R5383 Other fatigue: Secondary | ICD-10-CM | POA: Diagnosis not present

## 2022-05-07 DIAGNOSIS — Z833 Family history of diabetes mellitus: Secondary | ICD-10-CM | POA: Diagnosis not present

## 2022-05-07 DIAGNOSIS — D751 Secondary polycythemia: Secondary | ICD-10-CM | POA: Diagnosis not present

## 2022-05-07 DIAGNOSIS — Z801 Family history of malignant neoplasm of trachea, bronchus and lung: Secondary | ICD-10-CM | POA: Insufficient documentation

## 2022-05-07 DIAGNOSIS — R110 Nausea: Secondary | ICD-10-CM | POA: Diagnosis not present

## 2022-05-07 DIAGNOSIS — Z8719 Personal history of other diseases of the digestive system: Secondary | ICD-10-CM | POA: Insufficient documentation

## 2022-05-07 DIAGNOSIS — Z79899 Other long term (current) drug therapy: Secondary | ICD-10-CM | POA: Insufficient documentation

## 2022-05-07 DIAGNOSIS — I10 Essential (primary) hypertension: Secondary | ICD-10-CM | POA: Diagnosis not present

## 2022-05-07 DIAGNOSIS — E785 Hyperlipidemia, unspecified: Secondary | ICD-10-CM | POA: Insufficient documentation

## 2022-05-07 DIAGNOSIS — Z888 Allergy status to other drugs, medicaments and biological substances status: Secondary | ICD-10-CM | POA: Insufficient documentation

## 2022-05-07 DIAGNOSIS — Z87891 Personal history of nicotine dependence: Secondary | ICD-10-CM | POA: Insufficient documentation

## 2022-05-07 DIAGNOSIS — Z8249 Family history of ischemic heart disease and other diseases of the circulatory system: Secondary | ICD-10-CM | POA: Diagnosis not present

## 2022-05-07 HISTORY — DX: Secondary polycythemia: D75.1

## 2022-05-07 LAB — CBC WITH DIFFERENTIAL/PLATELET
Abs Immature Granulocytes: 0.01 10*3/uL (ref 0.00–0.07)
Basophils Absolute: 0 10*3/uL (ref 0.0–0.1)
Basophils Relative: 0 %
Eosinophils Absolute: 0.1 10*3/uL (ref 0.0–0.5)
Eosinophils Relative: 1 %
HCT: 53.3 % — ABNORMAL HIGH (ref 39.0–52.0)
Hemoglobin: 17.9 g/dL — ABNORMAL HIGH (ref 13.0–17.0)
Immature Granulocytes: 0 %
Lymphocytes Relative: 26 %
Lymphs Abs: 2.1 10*3/uL (ref 0.7–4.0)
MCH: 29.8 pg (ref 26.0–34.0)
MCHC: 33.6 g/dL (ref 30.0–36.0)
MCV: 88.7 fL (ref 80.0–100.0)
Monocytes Absolute: 0.5 10*3/uL (ref 0.1–1.0)
Monocytes Relative: 6 %
Neutro Abs: 5.3 10*3/uL (ref 1.7–7.7)
Neutrophils Relative %: 67 %
Platelets: 225 10*3/uL (ref 150–400)
RBC: 6.01 MIL/uL — ABNORMAL HIGH (ref 4.22–5.81)
RDW: 12.5 % (ref 11.5–15.5)
WBC: 8.1 10*3/uL (ref 4.0–10.5)
nRBC: 0 % (ref 0.0–0.2)

## 2022-05-07 LAB — IRON AND TIBC
Iron: 107 ug/dL (ref 45–182)
Saturation Ratios: 29 % (ref 17.9–39.5)
TIBC: 375 ug/dL (ref 250–450)
UIBC: 268 ug/dL

## 2022-05-07 LAB — FERRITIN: Ferritin: 157 ng/mL (ref 24–336)

## 2022-05-07 NOTE — Progress Notes (Signed)
Hi Keith Barber.  Your vitamin D came back and they are normal.  Keep that appt with Dr. Tasia Catchings so we can work on the cause of your fatigue.

## 2022-05-07 NOTE — Progress Notes (Signed)
Pt here to establish care for polycythemia. Pt reports he has been feeling very tired for the last 1-2 months

## 2022-05-07 NOTE — Progress Notes (Signed)
Hematology/Oncology Consult note [phone] [phone]      Patient Care Team: Jon Billings, NP as PCP - General (Nurse Practitioner)  REFERRING PROVIDER: Jon Billings, NP   ASSESSMENT & PLAN:   Erythrocytosis Labs are reviewed and discussed with patient. Erythrocytosis is an abnormal elevation of hemoglobin (Hgb) and/or hematocrit (Hct) in peripheral blood, and this can be caused by primary etiology, ie myeloproliferative disease, or secondary etiology, ie sleep apnea, smoking, etc  or familiar condition.  I will obtain erythropoietin, carbo monoxide level, rule out primary etiology, JAK2 with reflex to other mutations, BCR-ABL1 FISH.    Recommend empiric phlebotomy 556m x 1.    Orders Placed This Encounter  Procedures   CBC with Differential/Platelet    Standing Status:   Future    Number of Occurrences:   1    Standing Expiration Date:   05/08/2023   BCR-ABL1 FISH    Standing Status:   Future    Number of Occurrences:   1    Standing Expiration Date:   05/08/2023   Carbon monoxide, blood (performed at ref lab)    Standing Status:   Future    Number of Occurrences:   1    Standing Expiration Date:   05/08/2023   Erythropoietin    Standing Status:   Future    Number of Occurrences:   1    Standing Expiration Date:   05/08/2023   JAK2 V617F rfx CALR/MPL/E12-15    Standing Status:   Future    Number of Occurrences:   1    Standing Expiration Date:   05/08/2023   Iron and TIBC    Standing Status:   Future    Number of Occurrences:   1    Standing Expiration Date:   05/08/2023   Ferritin    Standing Status:   Future    Number of Occurrences:   1    Standing Expiration Date:   05/08/2023   Follow up in 3-4 weeks to review results.  All questions were answered. The patient knows to call the clinic with any problems, questions or concerns.  ZEarlie Server MD, PhD [facility] Hemat[facility] Oncology 05/07/2022    CHIEF COMPLAINTS/REASON  FOR VISIT:  Evaluation of erythrocytosis  HISTORY OF PRESENTING ILLNESS:   KKaras Pickerillis a  51y.o.  male with PMH listed below was seen in consultation at the request of  HJon Billings NP  for evaluation of /erythrocytosis/elevated hemoglobin,   Patient has abnormal CBC with hemoglobin of 18.5, Hct 55,  Reviewed patient's previous labs.  elevated hemoglobin is chronic onset, dated back to 2018.   Patient denies unintentional weight loss, fever, chills, fatigue, night sweats, cough, SOB, chest pain.  Former smoker, 13 pack year smoking history,  Denies previous VTE history. Denies known history of sleep apnea. He denies any testosterone use.   + fatigue, not refreshed when he wakes up in the morning. + nausea, no vomiting, improves as day goes along.  Family history of small cell lung cancer.    MEDICAL HISTORY:  Past Medical History:  Diagnosis Date   Bone spur    LEFT SHOULDER THAT MAKES HIS LEFT ARM TINGLE - had removed   Diverticulitis large intestine 09/2017   GERD (gastroesophageal reflux disease)    Hemorrhoids    Hyperlipidemia    Hypertension    Migraines    rare migraines    SURGICAL HISTORY: Past Surgical History:  Procedure Laterality Date  [place] STUDY N/A 05/30/2018   Procedure: [place] STUDY;  Surgeon: Virgel Manifold, MD;  Location: [facility];  Service: Gastroenterology;  Laterality: N/A;   bicep tendon tear      COLONOSCOPY WITH PROPOFOL N/A 02/01/2018   Procedure: COLONOSCOPY WITH PROPOFOL;  Surgeon: Virgel Manifold, MD;  Location: [facility];  Service: Endoscopy;  Laterality: N/A;   ESOPHAGEAL MANOMETRY N/A 05/30/2018   Procedure: ESOPHAGEAL MANOMETRY (EM);  Surgeon: Virgel Manifold, MD;  Location: [facility];  Service: Gastroenterology;  Laterality: N/A;   ESOPHAGOGASTRODUODENOSCOPY (EGD) WITH PROPOFOL N/A 02/01/2018   Procedure: ESOPHAGOGASTRODUODENOSCOPY (EGD) WITH PROPOFOL;  Surgeon: Virgel Manifold, MD;  Location: [facility];  Service: Endoscopy;  Laterality: N/A;   INGUINAL HERNIA REPAIR Bilateral 01/21/2017   Procedure: LAPAROSCOPIC BILATERAL INGUINAL HERNIA REPAIR;  Surgeon: Jules Husbands, MD;  Location: [facility];  Service: General;  Laterality: Bilateral;   KNEE SURGERY Left    SHOULDER ARTHROSCOPY WITH DISTAL CLAVICLE RESECTION Left 07/05/2017   Procedure: LEFT SHOULDER ARTHROSCOPY, SUBACROMIAL DECOMPRESSION, DISTAL CLAVICLE RESECTION, POSSIBLE MINI OPEN ROTATOR CUFF REPAIR;  Surgeon: [name], MD;  Location: [place];  Service: Orthopedics;  Laterality: Left;   SHOULDER CLOSED REDUCTION Left 12/13/2017   Procedure: LEFT SHOULDER MANIPULATION with steroid injection;  Surgeon: [name], MD;  Location: [place];  Service: Orthopedics;  Laterality: Left;   SHOULDER SURGERY Right    UMBILICAL HERNIA REPAIR N/A 01/21/2017   Procedure: HERNIA REPAIR UMBILICAL ADULT;  Surgeon: Jules Husbands, MD;  Location: [facility];  Service: General;  Laterality: N/A;    SOCIAL HISTORY: Social History   Socioeconomic History   Marital status: Divorced    Spouse name: Not on file   Number of children: Not on file   Years of education: Not on file   Highest education level: Not on file  Occupational History   Not on file  Tobacco Use   Smoking status: Former    Packs/day: 0.50    Years: 26.00    Total pack years: 13.00    Types: Cigarettes    Quit date: 01/13/2017    Years since quitting: 5.3   Smokeless tobacco: Never   Tobacco comments:    PT STARTED TAKING ZYBAN ON 01-08-17 IN HOPES OF QUITTING  Vaping Use   Vaping Use: Never used  Substance and Sexual Activity   Alcohol use: No   Drug use: No   Sexual activity: Yes  Other Topics Concern   Not on file  Social History Narrative   Not on file   Social Determinants of Health   Financial Resource Strain: Not on file  Food Insecurity: Not on file  Transportation Needs: Not on file  Physical Activity: Not  on file  Stress: Not on file  Social Connections: Not on file  Intimate Partner Violence: Not on file    FAMILY HISTORY: Family History  Problem Relation Age of Onset   Diabetes Mother    Migraines Mother    Lung cancer Father    Lung cancer Paternal Grandfather    Cancer Neg Hx    COPD Neg Hx    Heart disease Neg Hx    Stroke Neg Hx     ALLERGIES:  is allergic to amlodipine.  MEDICATIONS:  Current Outpatient Medications  Medication Sig Dispense Refill   diclofenac Sodium (VOLTAREN) 1 % GEL Apply 2 g topically 4 (four) times daily. 150 g 1   loperamide (IMODIUM) 2 MG capsule  Take 2-4 mg by mouth as needed for diarrhea or loose stools.     metFORMIN (GLUCOPHAGE) 500 MG tablet Take 1 tablet (500 mg total) by mouth 2 (two) times daily with a meal. 360 tablet 1   metoprolol succinate (TOPROL-XL) 25 MG 24 hr tablet Take 1 tablet (25 mg total) by mouth daily. 90 tablet 1   naproxen sodium (ALEVE) 220 MG tablet Take 220 mg by mouth.     rizatriptan (MAXALT) 10 MG tablet Take 1 tablet by mouth twice daily as needed 180 tablet 0   rosuvastatin (CRESTOR) 10 MG tablet Take 1 tablet by mouth once daily 90 tablet 3   TRULICITY 9.73 ZH/2.9JM SOPN INJECT 0.75 MG SUBCUTANEOUSLY ONCE A WEEK 8 mL 0   valsartan (DIOVAN) 40 MG tablet Take 1 tablet (40 mg total) by mouth daily. 90 tablet 1   No current facility-administered medications for this visit.     Review of Systems  Constitutional:  Positive for fatigue. Negative for appetite change, chills, fever and unexpected weight change.  HENT:   Negative for hearing loss and voice change.   Eyes:  Negative for eye problems and icterus.  Respiratory:  Negative for chest tightness, cough and shortness of breath.   Cardiovascular:  Negative for chest pain and leg swelling.  Gastrointestinal:  Positive for nausea. Negative for abdominal distention and abdominal pain.  Endocrine: Negative for hot flashes.  Genitourinary:  Negative for difficulty  urinating, dysuria and frequency.   Musculoskeletal:  Negative for arthralgias.  Skin:  Negative for itching and rash.  Neurological:  Negative for light-headedness and numbness.  Hematological:  Negative for adenopathy. Does not bruise/bleed easily.  Psychiatric/Behavioral:  Negative for confusion.     PHYSICAL EXAMINATION: ECOG PERFORMANCE STATUS: 0 - Asymptomatic Vitals:   05/07/22 1112  BP: (!) 125/93  Pulse: 72  Resp: 18  Temp: 97.7 F (36.5 C)   Filed Weights   05/07/22 1112  Weight: 197 lb 1.6 oz (89.4 kg)    Physical Exam Constitutional:      General: He is not in acute distress.    Appearance: He is obese.  HENT:     Head: Normocephalic and atraumatic.  Eyes:     General: No scleral icterus. Cardiovascular:     Rate and Rhythm: Normal rate and regular rhythm.     Heart sounds: Normal heart sounds.  Pulmonary:     Effort: Pulmonary effort is normal. No respiratory distress.     Breath sounds: No wheezing.  Abdominal:     General: Bowel sounds are normal. There is no distension.     Palpations: Abdomen is soft.  Musculoskeletal:        General: No deformity. Normal range of motion.     Cervical back: Normal range of motion and neck supple.  Skin:    General: Skin is warm and dry.     Findings: No erythema or rash.  Neurological:     Mental Status: He is alert and oriented to person, place, and time. Mental status is at baseline.     Cranial Nerves: No cranial nerve deficit.     Coordination: Coordination normal.  Psychiatric:        Mood and Affect: Mood normal.     LABORATORY DATA:  I have reviewed the data as listed    Latest Ref Rng & Units 05/07/2022   11:53 AM 04/28/2022    3:00 PM 07/16/2021    9:19 AM  CBC  WBC 4.0 -  10.5 K/uL 8.1  9.7  7.3   Hemoglobin 13.0 - 17.0 g/dL 17.9  18.5  18.7   Hematocrit 39.0 - 52.0 % 53.3  55.0  55.3   Platelets 150 - 400 K/uL 225  230  193       Latest Ref Rng & Units 04/27/2022    9:16 AM 01/18/2022    10:53 AM 10/15/2021   11:24 AM  CMP  Glucose 70 - 99 mg/dL 105  137  222   BUN 6 - 20 mg/dL '16  10  13   '$ Creatinine 0.61 - 1.24 mg/dL 0.91  0.92  0.76   Sodium 135 - 145 mmol/L 140  140  139   Potassium 3.5 - 5.1 mmol/L 4.1  3.8  4.2   Chloride 98 - 111 mmol/L 109  107  102   CO2 22 - 32 mmol/L '27  27  29   '$ Calcium 8.9 - 10.3 mg/dL 8.6  9.1  8.9   Total Protein 6.5 - 8.1 g/dL 7.6  7.8  8.0   Total Bilirubin 0.3 - 1.2 mg/dL 0.7  1.3  1.5   Alkaline Phos 38 - 126 U/L 109  113  135   AST 15 - 41 U/L '29  31  29   '$ ALT 0 - 44 U/L 41  39  50      RADIOGRAPHIC STUDIES: I have personally reviewed the radiological images as listed and agreed with the findings in the report. DG Chest 2 View  Result Date: 04/23/2022 CLINICAL DATA:  Chest pain EXAM: CHEST - 2 VIEW COMPARISON:  01/16/2020 FINDINGS: The heart size and mediastinal contours are within normal limits. Both lungs are clear. The visualized skeletal structures are unremarkable. IMPRESSION: No active cardiopulmonary disease. Electronically Signed   By: Kathreen Devoid M.D.   On: 04/23/2022 10:31

## 2022-05-07 NOTE — Assessment & Plan Note (Signed)
Labs are reviewed and discussed with patient. Erythrocytosis is an abnormal elevation of hemoglobin (Hgb) and/or hematocrit (Hct) in peripheral blood, and this can be caused by primary etiology, ie myeloproliferative disease, or secondary etiology, ie sleep apnea, smoking, etc  or familiar condition.  I will obtain erythropoietin, carbo monoxide level, rule out primary etiology, JAK2 with reflex to other mutations, BCR-ABL1 FISH.    Recommend empiric phlebotomy 536m x 1.

## 2022-05-07 NOTE — Addendum Note (Signed)
Addended by: Earlie Server on: 05/07/2022 07:40 PM   Modules accepted: Orders

## 2022-05-08 LAB — ERYTHROPOIETIN: Erythropoietin: 7.6 m[IU]/mL (ref 2.6–18.5)

## 2022-05-10 ENCOUNTER — Telehealth: Payer: Self-pay

## 2022-05-10 LAB — CARBON MONOXIDE, BLOOD (PERFORMED AT REF LAB): Carbon Monoxide, Blood: 3.7 % — ABNORMAL HIGH (ref 0.0–3.6)

## 2022-05-10 NOTE — Telephone Encounter (Signed)
-----   Message from Earlie Server, MD sent at 05/07/2022  7:39 PM EDT ----- Please arrange patient to get phlebotomy during week of 10/23  Also add lab encounter+ phlebotomy to his next MD visit. Thanks.  I have discussed with patient about phlebotomy if his lab result indicates.

## 2022-05-10 NOTE — Telephone Encounter (Signed)
Pt informed of plan via Mychart message.   -Please schedule phlebotomy this week.  -update appt on 11/21 to be Lab/ MD/ phlebotomy

## 2022-05-11 LAB — BCR-ABL1 FISH
Cells Analyzed: 200
Cells Counted: 200

## 2022-05-12 ENCOUNTER — Inpatient Hospital Stay: Payer: 59

## 2022-05-12 ENCOUNTER — Ambulatory Visit: Payer: 59 | Admitting: Internal Medicine

## 2022-05-12 VITALS — BP 113/81 | HR 84 | Temp 98.5°F

## 2022-05-12 DIAGNOSIS — D751 Secondary polycythemia: Secondary | ICD-10-CM | POA: Diagnosis not present

## 2022-05-12 NOTE — Patient Instructions (Signed)

## 2022-05-12 NOTE — Progress Notes (Signed)
Performed pt's first phlebotomy. Removed 500 ml of blood. Went over side effects from procedure. Observed post procedure. VSS. Accepted a beverage. Discharged to home.

## 2022-05-14 LAB — JAK2 V617F RFX CALR/MPL/E12-15

## 2022-05-14 LAB — CALR +MPL + E12-E15  (REFLEX)

## 2022-05-14 NOTE — Telephone Encounter (Signed)
Please move appts on 11/21 to the end of next week.

## 2022-05-20 ENCOUNTER — Inpatient Hospital Stay: Payer: 59

## 2022-05-20 ENCOUNTER — Inpatient Hospital Stay: Payer: 59 | Attending: Oncology

## 2022-05-20 ENCOUNTER — Encounter: Payer: Self-pay | Admitting: Oncology

## 2022-05-20 ENCOUNTER — Telehealth: Payer: Self-pay

## 2022-05-20 ENCOUNTER — Inpatient Hospital Stay (HOSPITAL_BASED_OUTPATIENT_CLINIC_OR_DEPARTMENT_OTHER): Payer: 59 | Admitting: Oncology

## 2022-05-20 VITALS — BP 119/90 | HR 79 | Temp 98.6°F | Resp 18 | Wt 196.7 lb

## 2022-05-20 DIAGNOSIS — I10 Essential (primary) hypertension: Secondary | ICD-10-CM | POA: Insufficient documentation

## 2022-05-20 DIAGNOSIS — Z809 Family history of malignant neoplasm, unspecified: Secondary | ICD-10-CM | POA: Diagnosis not present

## 2022-05-20 DIAGNOSIS — D751 Secondary polycythemia: Secondary | ICD-10-CM | POA: Diagnosis not present

## 2022-05-20 DIAGNOSIS — R110 Nausea: Secondary | ICD-10-CM | POA: Insufficient documentation

## 2022-05-20 DIAGNOSIS — Z8719 Personal history of other diseases of the digestive system: Secondary | ICD-10-CM | POA: Diagnosis not present

## 2022-05-20 DIAGNOSIS — Z888 Allergy status to other drugs, medicaments and biological substances status: Secondary | ICD-10-CM | POA: Diagnosis not present

## 2022-05-20 DIAGNOSIS — R5383 Other fatigue: Secondary | ICD-10-CM | POA: Insufficient documentation

## 2022-05-20 DIAGNOSIS — Z801 Family history of malignant neoplasm of trachea, bronchus and lung: Secondary | ICD-10-CM | POA: Diagnosis not present

## 2022-05-20 DIAGNOSIS — Z8249 Family history of ischemic heart disease and other diseases of the circulatory system: Secondary | ICD-10-CM | POA: Diagnosis not present

## 2022-05-20 DIAGNOSIS — E785 Hyperlipidemia, unspecified: Secondary | ICD-10-CM | POA: Insufficient documentation

## 2022-05-20 DIAGNOSIS — Z833 Family history of diabetes mellitus: Secondary | ICD-10-CM | POA: Insufficient documentation

## 2022-05-20 DIAGNOSIS — Z79899 Other long term (current) drug therapy: Secondary | ICD-10-CM | POA: Diagnosis not present

## 2022-05-20 DIAGNOSIS — Z87891 Personal history of nicotine dependence: Secondary | ICD-10-CM | POA: Insufficient documentation

## 2022-05-20 LAB — HEMOGLOBIN AND HEMATOCRIT, BLOOD
HCT: 47.5 % (ref 39.0–52.0)
Hemoglobin: 16.1 g/dL (ref 13.0–17.0)

## 2022-05-20 NOTE — Progress Notes (Signed)
Pt here for follow up. Pt reports that he was on antibiotics due to dental issues. He has completed antibiotics.

## 2022-05-20 NOTE — Assessment & Plan Note (Signed)
Labs are reviewed and discussed with patient. Hb has improved, Hct <52, hold off phlebotomy.  normal erythropoietin, slightly elevated carbo monoxide level JAK2 V617F mutation negative, with reflex to other mutations CALR, MPL, JAK 2 Ex 12-15 mutations negative. negative BCR-ABL1 FISH.   Discussed with patient that erythrocytosis is likely due to secondary etiologies. He denies smoking I recommend patient to discuss with pcp for sleep study.

## 2022-05-20 NOTE — Telephone Encounter (Signed)
Patient came to the office today stating that Dr. Tasia Catchings will not fill out his form to continue donating plasma. He states he was advised that his PCP should be the one to sign if appropriate. Patient would like to know if this can be completed by tomorrow afternoon, I advised patient provider will be seeing patients tomorrow and will not be in office in the afternoon but we will do our best to fill it out as soon as we can. Forms placed in provider's folder for completion.

## 2022-05-20 NOTE — Progress Notes (Signed)
Hematology/Oncology Consult note [phone] [phone]      Patient Care Team: Jon Billings, NP as PCP - General (Nurse Practitioner)  REFERRING PROVIDER: Jon Billings, NP   ASSESSMENT & PLAN:   Erythrocytosis Labs are reviewed and discussed with patient. Hb has improved, Hct <52, hold off phlebotomy.  normal erythropoietin, slightly elevated carbo monoxide level JAK2 V617F mutation negative, with reflex to other mutations CALR, MPL, JAK 2 Ex 12-15 mutations negative. negative BCR-ABL1 FISH.   Discussed with patient that erythrocytosis is likely due to secondary etiologies. He denies smoking I recommend patient to discuss with pcp for sleep study.    Orders Placed This Encounter  Procedures   CBC with Differential/Platelet    Standing Status:   Future    Standing Expiration Date:   05/21/2023   Follow up in 3 months.  All questions were answered. The patient knows to call the clinic with any problems, questions or concerns.  Earlie Server, MD, PhD [facility] Hematology Oncology 05/20/2022    CHIEF COMPLAINTS/REASON FOR VISIT:  Follow up for erythrocytosis  HISTORY OF PRESENTING ILLNESS:   Keith Barber is a  51 y.o.  male who presents for follow up of erythrocytosis.    Patient has abnormal CBC with hemoglobin of 18.5, Hct 55,  Reviewed patient's previous labs.  elevated hemoglobin is chronic onset, dated back to 2018.   Patient denies unintentional weight loss, fever, chills, fatigue, night sweats, cough, SOB, chest pain.  Former smoker, 13 pack year smoking history,  Denies previous VTE history. Denies known history of sleep apnea. He denies any testosterone use.   + fatigue, not refreshed when he wakes up in the morning. + nausea, no vomiting, improves as day goes along.  Family history of small cell lung cancer.    INTERVAL HISTORY Keith Barber is a 51 y.o. male who has above history reviewed by me today presents  for follow up visit for erythrocytosis.  He has had phlebotomy.  He presents to discuss lab results.   MEDICAL HISTORY:  Past Medical History:  Diagnosis Date   Bone spur    LEFT SHOULDER THAT MAKES HIS LEFT ARM TINGLE - had removed   Diverticulitis large intestine 09/2017   GERD (gastroesophageal reflux disease)    Hemorrhoids    Hyperlipidemia    Hypertension    Migraines    rare migraines    SURGICAL HISTORY: Past Surgical History:  Procedure Laterality Date   44 HOUR [place] STUDY N/A 05/30/2018   Procedure: 24 HOUR [place] STUDY;  Surgeon: Virgel Manifold, MD;  Location: [facility];  Service: Gastroenterology;  Laterality: N/A;   bicep tendon tear      COLONOSCOPY WITH PROPOFOL N/A 02/01/2018   Procedure: COLONOSCOPY WITH PROPOFOL;  Surgeon: Virgel Manifold, MD;  Location: [facility];  Service: Endoscopy;  Laterality: N/A;   ESOPHAGEAL MANOMETRY N/A 05/30/2018   Procedure: ESOPHAGEAL MANOMETRY (EM);  Surgeon: Virgel Manifold, MD;  Location: [facility];  Service: Gastroenterology;  Laterality: N/A;   ESOPHAGOGASTRODUODENOSCOPY (EGD) WITH PROPOFOL N/A 02/01/2018   Procedure: ESOPHAGOGASTRODUODENOSCOPY (EGD) WITH PROPOFOL;  Surgeon: Virgel Manifold, MD;  Location: [facility];  Service: Endoscopy;  Laterality: N/A;   INGUINAL HERNIA REPAIR Bilateral 01/21/2017   Procedure: LAPAROSCOPIC BILATERAL INGUINAL HERNIA REPAIR;  Surgeon: Jules Husbands, MD;  Location: [facility];  Service: General;  Laterality: Bilateral;   KNEE SURGERY Left    SHOULDER ARTHROSCOPY WITH DISTAL CLAVICLE RESECTION Left 07/05/2017   Procedure: LEFT  SHOULDER ARTHROSCOPY, SUBACROMIAL DECOMPRESSION, DISTAL CLAVICLE RESECTION, POSSIBLE MINI OPEN ROTATOR CUFF REPAIR;  Surgeon: [name], MD;  Location: [place];  Service: Orthopedics;  Laterality: Left;   SHOULDER CLOSED REDUCTION Left 12/13/2017   Procedure: LEFT SHOULDER MANIPULATION with steroid injection;  Surgeon: [name], MD;  Location: [place];  Service: Orthopedics;  Laterality: Left;   SHOULDER SURGERY Right    UMBILICAL HERNIA REPAIR N/A 01/21/2017   Procedure: HERNIA REPAIR UMBILICAL ADULT;  Surgeon: Jules Husbands, MD;  Location: [facility];  Service: General;  Laterality: N/A;    SOCIAL HISTORY: Social History   Socioeconomic History   Marital status: Divorced    Spouse name: Not on file   Number of children: Not on file   Years of education: Not on file   Highest education level: Not on file  Occupational History   Not on file  Tobacco Use   Smoking status: Former    Packs/day: 0.50    Years: 26.00    Total pack years: 13.00    Types: Cigarettes    Quit date: 01/13/2017    Years since quitting: 5.3   Smokeless tobacco: Never   Tobacco comments:    PT STARTED TAKING ZYBAN ON 01-08-17 IN HOPES OF QUITTING  Vaping Use   Vaping Use: Never used  Substance and Sexual Activity   Alcohol use: No   Drug use: No   Sexual activity: Yes  Other Topics Concern   Not on file  Social History Narrative   Not on file   Social Determinants of Health   Financial Resource Strain: Not on file  Food Insecurity: Not on file  Transportation Needs: Not on file  Physical Activity: Not on file  Stress: Not on file  Social Connections: Not on file  Intimate Partner Violence: Not on file    FAMILY HISTORY: Family History  Problem Relation Age of Onset   Diabetes Mother    Migraines Mother    Lung cancer Father    Lung cancer Paternal Grandfather    Cancer Neg Hx    COPD Neg Hx    Heart disease Neg Hx    Stroke Neg Hx     ALLERGIES:  is allergic to amlodipine.  MEDICATIONS:  Current Outpatient Medications  Medication Sig Dispense Refill   diclofenac Sodium (VOLTAREN) 1 % GEL Apply 2 g topically 4 (four) times daily. 150 g 1   loperamide (IMODIUM) 2 MG capsule Take 2-4 mg by mouth as needed for diarrhea or loose stools.     metFORMIN (GLUCOPHAGE) 500 MG tablet Take 1 tablet (500 mg  total) by mouth 2 (two) times daily with a meal. 360 tablet 1   metoprolol succinate (TOPROL-XL) 25 MG 24 hr tablet Take 1 tablet (25 mg total) by mouth daily. 90 tablet 1   naproxen sodium (ALEVE) 220 MG tablet Take 220 mg by mouth.     rizatriptan (MAXALT) 10 MG tablet Take 1 tablet by mouth twice daily as needed 180 tablet 0   rosuvastatin (CRESTOR) 10 MG tablet Take 1 tablet by mouth once daily 90 tablet 3   TRULICITY 4.13 KG/4.0NU SOPN INJECT 0.75 MG SUBCUTANEOUSLY ONCE A WEEK 8 mL 0   valsartan (DIOVAN) 40 MG tablet Take 1 tablet (40 mg total) by mouth daily. 90 tablet 1   No current facility-administered medications for this visit.     Review of Systems  Constitutional:  Positive for fatigue. Negative for appetite change, chills, fever and  unexpected weight change.  HENT:   Negative for hearing loss and voice change.   Eyes:  Negative for eye problems and icterus.  Respiratory:  Negative for chest tightness, cough and shortness of breath.   Cardiovascular:  Negative for chest pain and leg swelling.  Gastrointestinal:  Positive for nausea. Negative for abdominal distention and abdominal pain.  Endocrine: Negative for hot flashes.  Genitourinary:  Negative for difficulty urinating, dysuria and frequency.   Musculoskeletal:  Negative for arthralgias.  Skin:  Negative for itching and rash.  Neurological:  Negative for light-headedness and numbness.  Hematological:  Negative for adenopathy. Does not bruise/bleed easily.  Psychiatric/Behavioral:  Negative for confusion.     PHYSICAL EXAMINATION: ECOG PERFORMANCE STATUS: 0 - Asymptomatic Vitals:   05/20/22 1414  BP: (!) 119/90  Pulse: 79  Resp: 18  Temp: 98.6 F (37 C)   Filed Weights   05/20/22 1414  Weight: 196 lb 11.2 oz (89.2 kg)    Physical Exam Constitutional:      General: He is not in acute distress.    Appearance: He is obese.  HENT:     Head: Normocephalic and atraumatic.  Eyes:     General: No scleral  icterus. Cardiovascular:     Rate and Rhythm: Normal rate and regular rhythm.     Heart sounds: Normal heart sounds.  Pulmonary:     Effort: Pulmonary effort is normal. No respiratory distress.     Breath sounds: No wheezing.  Abdominal:     General: Bowel sounds are normal. There is no distension.     Palpations: Abdomen is soft.  Musculoskeletal:        General: No deformity. Normal range of motion.     Cervical back: Normal range of motion and neck supple.  Skin:    General: Skin is warm and dry.     Findings: No erythema or rash.  Neurological:     Mental Status: He is alert and oriented to person, place, and time. Mental status is at baseline.     Cranial Nerves: No cranial nerve deficit.     Coordination: Coordination normal.  Psychiatric:        Mood and Affect: Mood normal.     LABORATORY DATA:  I have reviewed the data as listed    Latest Ref Rng & Units 05/20/2022    2:02 PM 05/07/2022   11:53 AM 04/28/2022    3:00 PM  CBC  WBC 4.0 - 10.5 K/uL  8.1  9.7   Hemoglobin 13.0 - 17.0 g/dL 16.1  17.9  18.5   Hematocrit 39.0 - 52.0 % 47.5  53.3  55.0   Platelets 150 - 400 K/uL  225  230       Latest Ref Rng & Units 04/27/2022    9:16 AM 01/18/2022   10:53 AM 10/15/2021   11:24 AM  CMP  Glucose 70 - 99 mg/dL 105  137  222   BUN 6 - 20 mg/dL '16  10  13   '$ Creatinine 0.61 - 1.24 mg/dL 0.91  0.92  0.76   Sodium 135 - 145 mmol/L 140  140  139   Potassium 3.5 - 5.1 mmol/L 4.1  3.8  4.2   Chloride 98 - 111 mmol/L 109  107  102   CO2 22 - 32 mmol/L '27  27  29   '$ Calcium 8.9 - 10.3 mg/dL 8.6  9.1  8.9   Total Protein 6.5 - 8.1 g/dL 7.6  7.8  8.0   Total Bilirubin 0.3 - 1.2 mg/dL 0.7  1.3  1.5   Alkaline Phos 38 - 126 U/L 109  113  135   AST 15 - 41 U/L '29  31  29   '$ ALT 0 - 44 U/L 41  39  50      RADIOGRAPHIC STUDIES: I have personally reviewed the radiological images as listed and agreed with the findings in the report. DG Chest 2 View  Result Date:  04/23/2022 CLINICAL DATA:  Chest pain EXAM: CHEST - 2 VIEW COMPARISON:  01/16/2020 FINDINGS: The heart size and mediastinal contours are within normal limits. Both lungs are clear. The visualized skeletal structures are unremarkable. IMPRESSION: No active cardiopulmonary disease. Electronically Signed   By: Kathreen Devoid M.D.   On: 04/23/2022 10:31

## 2022-05-21 ENCOUNTER — Other Ambulatory Visit: Payer: 59

## 2022-05-21 ENCOUNTER — Ambulatory Visit: Payer: 59 | Admitting: Oncology

## 2022-05-24 ENCOUNTER — Telehealth: Payer: Self-pay

## 2022-05-24 DIAGNOSIS — R0683 Snoring: Secondary | ICD-10-CM

## 2022-05-24 NOTE — Telephone Encounter (Signed)
Referral placed for sleep study.   Plasma form completed.

## 2022-05-24 NOTE — Telephone Encounter (Signed)
Patient came to office asking for sleep study referral if possible. Please advise.

## 2022-05-24 NOTE — Telephone Encounter (Signed)
Patient has been made aware of referral and plasma paperwork being faxed over to [facility]. No further action needed.

## 2022-05-24 NOTE — Addendum Note (Signed)
Addended by: Jon Billings on: 05/24/2022 12:45 PM   Modules accepted: Orders

## 2022-06-08 ENCOUNTER — Ambulatory Visit: Payer: 59 | Admitting: Oncology

## 2022-06-08 ENCOUNTER — Other Ambulatory Visit: Payer: 59

## 2022-06-14 DIAGNOSIS — R6883 Chills (without fever): Secondary | ICD-10-CM | POA: Diagnosis not present

## 2022-06-14 DIAGNOSIS — U071 COVID-19: Secondary | ICD-10-CM | POA: Diagnosis not present

## 2022-06-14 DIAGNOSIS — J029 Acute pharyngitis, unspecified: Secondary | ICD-10-CM | POA: Diagnosis not present

## 2022-06-29 ENCOUNTER — Encounter: Payer: Self-pay | Admitting: Oncology

## 2022-06-29 ENCOUNTER — Encounter: Payer: Self-pay | Admitting: Neurology

## 2022-06-29 ENCOUNTER — Ambulatory Visit: Payer: 59 | Admitting: Neurology

## 2022-06-29 VITALS — BP 125/82 | HR 92 | Ht 68.0 in | Wt 198.0 lb

## 2022-06-29 DIAGNOSIS — G4719 Other hypersomnia: Secondary | ICD-10-CM | POA: Diagnosis not present

## 2022-06-29 DIAGNOSIS — Z9189 Other specified personal risk factors, not elsewhere classified: Secondary | ICD-10-CM | POA: Diagnosis not present

## 2022-06-29 DIAGNOSIS — R519 Headache, unspecified: Secondary | ICD-10-CM | POA: Diagnosis not present

## 2022-06-29 DIAGNOSIS — E669 Obesity, unspecified: Secondary | ICD-10-CM

## 2022-06-29 DIAGNOSIS — R351 Nocturia: Secondary | ICD-10-CM

## 2022-06-29 DIAGNOSIS — D751 Secondary polycythemia: Secondary | ICD-10-CM | POA: Diagnosis not present

## 2022-06-29 DIAGNOSIS — R0681 Apnea, not elsewhere classified: Secondary | ICD-10-CM | POA: Diagnosis not present

## 2022-06-29 DIAGNOSIS — R0683 Snoring: Secondary | ICD-10-CM
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
## 2022-06-29 NOTE — Patient Instructions (Signed)

## 2022-06-30 ENCOUNTER — Encounter: Payer: Self-pay | Admitting: Nurse Practitioner

## 2022-06-30 ENCOUNTER — Telehealth: Payer: Self-pay | Admitting: Neurology

## 2022-06-30 NOTE — Telephone Encounter (Signed)
[employer] pending uploaded notes

## 2022-07-01 MED ORDER — CYCLOBENZAPRINE HCL 5 MG PO TABS: 5.0000 mg | ORAL_TABLET | Freq: Three times a day (TID) | ORAL | 1 refills | Status: DC | PRN

## 2022-07-05 NOTE — Telephone Encounter (Signed)
Checked the portal it is still pending.

## 2022-07-11 DIAGNOSIS — Z1152 Encounter for screening for COVID-19: Secondary | ICD-10-CM | POA: Insufficient documentation

## 2022-07-11 DIAGNOSIS — R0602 Shortness of breath: Secondary | ICD-10-CM | POA: Diagnosis not present

## 2022-07-11 DIAGNOSIS — R051 Acute cough: Secondary | ICD-10-CM | POA: Diagnosis not present

## 2022-07-11 DIAGNOSIS — J101 Influenza due to other identified influenza virus with other respiratory manifestations: Secondary | ICD-10-CM | POA: Insufficient documentation

## 2022-07-11 DIAGNOSIS — R059 Cough, unspecified: Secondary | ICD-10-CM | POA: Diagnosis not present

## 2022-07-12 ENCOUNTER — Emergency Department: Payer: 59

## 2022-07-12 ENCOUNTER — Emergency Department
Admission: EM | Admit: 2022-07-12 | Discharge: 2022-07-12 | Disposition: A | Discharge status: Home / Self Care | Payer: 59 | Attending: Emergency Medicine | Admitting: Emergency Medicine

## 2022-07-12 ENCOUNTER — Other Ambulatory Visit: Payer: Self-pay

## 2022-07-12 DIAGNOSIS — R0602 Shortness of breath: Secondary | ICD-10-CM | POA: Diagnosis not present

## 2022-07-12 DIAGNOSIS — J101 Influenza due to other identified influenza virus with other respiratory manifestations: Secondary | ICD-10-CM

## 2022-07-12 DIAGNOSIS — R051 Acute cough: Secondary | ICD-10-CM

## 2022-07-12 DIAGNOSIS — R059 Cough, unspecified: Secondary | ICD-10-CM | POA: Diagnosis not present

## 2022-07-12 LAB — RESP PANEL BY RT-PCR (RSV, FLU A&B, COVID)  RVPGX2
Influenza A by PCR: POSITIVE — AB
Influenza B by PCR: NEGATIVE
Resp Syncytial Virus by PCR: NEGATIVE
SARS Coronavirus 2 by RT PCR: NEGATIVE

## 2022-07-12 NOTE — ED Triage Notes (Signed)
Pt via POV c/o dry non-productive cough, fevers, chills, and generalized body aches x1 day. Endorse SOB with exertion and centralized chest pain. Pt states possible sick exposure. No cardiac hx

## 2022-07-12 NOTE — ED Notes (Signed)
Pt Dc to home. Ambulatory out of lobby with steady gait

## 2022-07-12 NOTE — Discharge Instructions (Signed)
Please take Tylenol and ibuprofen/Advil for your pain.  It is safe to take them together, or to alternate them every few hours.  Take up to '1000mg'$  of Tylenol at a time, up to 4 times per day.  Do not take more than 4000 mg of Tylenol in 24 hours.  For ibuprofen, take 400-600 mg, 3 - 4 times per day.  You do have the flu.  He will likely be sick for a few days.  Use the above medications to help with the fevers and the aches so you can stay hydrated and get plenty of rest.  Return to the ED with any worsening symptoms despite this

## 2022-07-12 NOTE — ED Provider Notes (Signed)
[facility] Provider Note    None    (approximate)   History   Shortness of Breath   HPI  Keith Barber is a 51 y.o. male who presents to the ED for evaluation of Shortness of Breath    Patient presents to the ED for evaluation of cough, myalgias, subjective chills, poor appetite, nausea and sensation of shortness of breath when he gets into a nonproductive coughing.Marland Kitchen  No chest pain or syncope.  Does report feeling sore to the bilateral flanks with coughing and is noted to be triage as "centralized chest pain" but he denies this with me.  Physical Exam   Triage Vital Signs: ED Triage Vitals  Enc Vitals Group     BP 07/12/22 0008 (!) 143/89     Pulse Rate 07/12/22 0007 (!) 118     Resp 07/12/22 0007 17     Temp 07/12/22 0007 99.4 F (37.4 C)     Temp Source 07/12/22 0007 Oral     SpO2 07/12/22 0007 94 %     Weight 07/12/22 0008 198 lb (89.8 kg)     Height 07/12/22 0008 '5\' 8"'$  (1.727 m)     Head Circumference --      Peak Flow --      Pain Score 07/12/22 0008 0     Pain Loc --      Pain Edu? --      Excl. in [place]? --     Most recent vital signs: Vitals:   07/12/22 0007 07/12/22 0008  BP:  (!) 143/89  Pulse: (!) 118   Resp: 17   Temp: 99.4 F (37.4 C)   SpO2: 94%     General: Awake, no distress.  Warm to the touch, ambulatory and looks well.  Dry mucous membranes. CV:  Good peripheral perfusion.  Tachycardic Resp:  Normal effort.  Clear lungs throughout.  No tachypnea Abd:  No distention.  MSK:  No deformity noted.  Neuro:  No focal deficits appreciated. Other:  Flank pain is reproducible on palpation, flinches with small touches.  No rashes.   ED Results / Procedures / Treatments   Labs (all labs ordered are listed, but only abnormal results are displayed) Labs Reviewed  RESP PANEL BY RT-PCR (RSV, FLU A&B, COVID)  [id number] - Abnormal; Notable for the following components:      Result Value   Influenza A by PCR POSITIVE (*)     All other components within normal limits    EKG Sinus tachycardia at a rate of 108 bpm.  Normal axis and intervals.  No clear signs of acute ischemia.  RADIOLOGY CXR interpreted by me without evidence of acute cardiopulmonary pathology.  Official radiology report(s): DG Chest 2 View  Result Date: 07/12/2022 CLINICAL DATA:  Shortness of breath and cough EXAM: CHEST - 2 VIEW COMPARISON:  04/23/2022 FINDINGS: Stable cardiomediastinal silhouette. Aortic atherosclerotic calcification. No focal consolidation, pleural effusion, or pneumothorax. No acute osseous abnormality. IMPRESSION: No active cardiopulmonary disease. Electronically Signed   By: [name] M.D.   On: 07/12/2022 00:42    PROCEDURES and INTERVENTIONS:  .1-3 Lead EKG Interpretation  Performed by: Vladimir Crofts, MD Authorized by: Vladimir Crofts, MD     Interpretation: abnormal     ECG rate:  104   ECG rate assessment: tachycardic     Rhythm: sinus tachycardia     Ectopy: none     Conduction: normal     Medications - No data to  display   IMPRESSION / MDM / ASSESSMENT AND PLAN / ED COURSE  I reviewed the triage vital signs and the nursing notes.  Differential diagnosis includes, but is not limited to, viral syndrome, pneumonia, ACS, pneumothorax  {Patient presents with symptoms of an acute illness or injury that is potentially life-threatening.  Well-appearing 51 year old presents with multiple symptoms most consistent with a viral syndrome, testing positive for influenza A as the likely etiology of his symptoms.  He looks typically well to me.  His tachycardia is noted.  His CXR is clear and his EKG is nonischemic.  I suspect his flank/chest discomfort is MSK in etiology from his recurrent nonproductive coughing.  He expresses explicit understanding and agreement with plan of care with not performing blood work to further assess for more subtle pathology as he does not want to wait and it is Christmas.  He  acknowledges risk of undiagnosed pathology.  We discussed supportive care for influenza and return precautions for the ED     FINAL CLINICAL IMPRESSION(S) / ED DIAGNOSES   Final diagnoses:  Influenza A  Acute cough     Rx / DC Orders   ED Discharge Orders     None        Note:  This document was prepared using Dragon voice recognition software and may include unintentional dictation errors.   Vladimir Crofts, MD 07/12/22 [phone]

## 2022-07-13 NOTE — Telephone Encounter (Signed)
Checked the status on the portal it is still pending.

## 2022-07-27 ENCOUNTER — Encounter: Payer: Self-pay | Admitting: Oncology

## 2022-07-27 NOTE — Telephone Encounter (Signed)
Checked status on the portal it is still pending.. I uploaded all the information that I have.

## 2022-07-28 ENCOUNTER — Encounter: Payer: Self-pay | Admitting: Oncology

## 2022-07-28 ENCOUNTER — Encounter: Payer: Self-pay | Admitting: Nurse Practitioner

## 2022-07-28 ENCOUNTER — Ambulatory Visit: Payer: 59 | Admitting: Nurse Practitioner

## 2022-07-28 ENCOUNTER — Other Ambulatory Visit
Admission: RE | Admit: 2022-07-28 | Discharge: 2022-07-28 | Disposition: A | Discharge status: Home / Self Care | Payer: 59 | Source: Ambulatory Visit | Attending: Nurse Practitioner | Admitting: Nurse Practitioner

## 2022-07-28 VITALS — BP 105/70 | HR 82 | Temp 97.5°F | Wt 197.8 lb

## 2022-07-28 DIAGNOSIS — E785 Hyperlipidemia, unspecified: Secondary | ICD-10-CM | POA: Insufficient documentation

## 2022-07-28 DIAGNOSIS — E1159 Type 2 diabetes mellitus with other circulatory complications: Secondary | ICD-10-CM

## 2022-07-28 DIAGNOSIS — D751 Secondary polycythemia: Secondary | ICD-10-CM

## 2022-07-28 DIAGNOSIS — K449 Diaphragmatic hernia without obstruction or gangrene: Secondary | ICD-10-CM | POA: Diagnosis not present

## 2022-07-28 DIAGNOSIS — E1169 Type 2 diabetes mellitus with other specified complication: Secondary | ICD-10-CM

## 2022-07-28 DIAGNOSIS — E118 Type 2 diabetes mellitus with unspecified complications: Secondary | ICD-10-CM | POA: Diagnosis not present

## 2022-07-28 DIAGNOSIS — I152 Hypertension secondary to endocrine disorders: Secondary | ICD-10-CM

## 2022-07-28 LAB — CBC WITH DIFFERENTIAL/PLATELET
Abs Immature Granulocytes: 0.04 10*3/uL (ref 0.00–0.07)
Basophils Absolute: 0 10*3/uL (ref 0.0–0.1)
Basophils Relative: 0 %
Eosinophils Absolute: 0.2 10*3/uL (ref 0.0–0.5)
Eosinophils Relative: 2 %
HCT: 52.3 % — ABNORMAL HIGH (ref 39.0–52.0)
Hemoglobin: 17.6 g/dL — ABNORMAL HIGH (ref 13.0–17.0)
Immature Granulocytes: 0 %
Lymphocytes Relative: 35 %
Lymphs Abs: 3.3 10*3/uL (ref 0.7–4.0)
MCH: 29.2 pg (ref 26.0–34.0)
MCHC: 33.7 g/dL (ref 30.0–36.0)
MCV: 86.7 fL (ref 80.0–100.0)
Monocytes Absolute: 0.7 10*3/uL (ref 0.1–1.0)
Monocytes Relative: 7 %
Neutro Abs: 5.3 10*3/uL (ref 1.7–7.7)
Neutrophils Relative %: 56 %
Platelets: 222 10*3/uL (ref 150–400)
RBC: 6.03 MIL/uL — ABNORMAL HIGH (ref 4.22–5.81)
RDW: 12.6 % (ref 11.5–15.5)
WBC: 9.5 10*3/uL (ref 4.0–10.5)
nRBC: 0 % (ref 0.0–0.2)

## 2022-07-28 LAB — COMPREHENSIVE METABOLIC PANEL
ALT: 35 U/L (ref 0–44)
AST: 29 U/L (ref 15–41)
Albumin: 4.5 g/dL (ref 3.5–5.0)
Alkaline Phosphatase: 104 U/L (ref 38–126)
Anion gap: 9 (ref 5–15)
BUN: 13 mg/dL (ref 6–20)
CO2: 26 mmol/L (ref 22–32)
Calcium: 9.4 mg/dL (ref 8.9–10.3)
Chloride: 103 mmol/L (ref 98–111)
Creatinine, Ser: 1.06 mg/dL (ref 0.61–1.24)
GFR, Estimated: >60 mL/min (ref >60)
Glucose, Bld: 125 mg/dL — ABNORMAL HIGH (ref 70–99)
Potassium: 4.6 mmol/L (ref 3.5–5.1)
Sodium: 138 mmol/L (ref 135–145)
Total Bilirubin: 1 mg/dL (ref 0.3–1.2)
Total Protein: 8.3 g/dL — ABNORMAL HIGH (ref 6.5–8.1)

## 2022-07-28 LAB — LIPID PANEL
Cholesterol: 138 mg/dL (ref 0–200)
HDL: 42 mg/dL (ref >40)
LDL Cholesterol: 74 mg/dL (ref 0–99)
Total CHOL/HDL Ratio: 3.3 RATIO
Triglycerides: 111 mg/dL (ref <150)
VLDL: 22 mg/dL (ref 0–40)

## 2022-07-28 NOTE — Assessment & Plan Note (Signed)
Chronic. Improved at last visit with an A1c 5.6%.  Will check labs today.  Microalbumin ordered. Continue with Metformin and Trulicity.  Follow up in 3 months for reevaluation. Call sooner if concerns arise.

## 2022-07-28 NOTE — Progress Notes (Signed)
BP 105/70   Pulse 82   Temp (!) 97.5 F (36.4 C) (Oral)   Wt 197 lb 12.8 oz (89.7 kg)   SpO2 98%   BMI 30.08 kg/m    Subjective:    Patient ID: Keith Barber, male    DOB: 1970/08/02, 52 y.o.   MRN: [id number]  HPI: Keith Barber is a 52 y.o. male  Chief Complaint  Patient presents with   Diabetes   Hyperlipidemia   Hypertension   Abdominal Pain    Pt states he has been having a sharp lower L abdominal pain off and on for the last few months.    HYPERTENSION / HYPERLIPIDEMIA Satisfied with current treatment? no Duration of hypertension: years BP monitoring frequency: sometimes BP range: 115-120/80 BP medication side effects: no Past BP meds:  Metoprolol  , valsartan Duration of hyperlipidemia: years Cholesterol medication side effects: no Cholesterol supplements: none Past cholesterol medications: Crestor Medication compliance: excellent compliance Aspirin: no Recent stressors: no Recurrent headaches: no Visual changes: no Palpitations: no Dyspnea: no Chest pain: no Lower extremity edema: no Dizzy/lightheaded: yes  DIABETES Hypoglycemic episodes:no Polydipsia/polyuria: no Visual disturbance: no Chest pain: no Paresthesias: no Glucose Monitoring: no  Accucheck frequency: Not Checking  Fasting glucose:  Post prandial:  Evening:  Before meals: Taking Insulin?: no  Long acting insulin:  Short acting insulin: Blood Pressure Monitoring: daily Retinal Examination: Up to Date Foot Exam: Up to Date Diabetic Education: Not Completed Pneumovax: Up to Date Influenza: Up to Date Aspirin: no   FATIGUE Patient has seen Neurology for a sleep study.  Patient states he has had issues with insurance getting the sleep study but heard back yesterday and should be getting it soon.  Duration:  months Severity: moderate  Onset: sudden Context when symptoms started:  unknown Symptoms improve with rest: no  Depressive symptoms: no Stress/anxiety:  no Insomnia: no  does wake up to use the bathroom Snoring: yes Observed apnea by bed partner: no Daytime hypersomnolence:yes Wakes feeling refreshed: no History of sleep study: no Dysnea on exertion:  no Orthopnea/PND: no Chest pain: no Chronic cough: no Lower extremity edema: no Arthralgias:no Myalgias: no Weakness: no Rash: no  ABDOMINAL PAIN Patient states he has been having LLQ abdominal pain for the last two months.  Is sometimes having diarrhea associated with with pain.  States it is not as much as it was before.  He has seen GI for the pain in the past.  States the pain isn't all the time.    Relevant past medical, surgical, family and social history reviewed and updated as indicated. Interim medical history since our last visit reviewed. Allergies and medications reviewed and updated.  Review of Systems  Constitutional:  Positive for fatigue.  Eyes:  Negative for visual disturbance.  Respiratory:  Negative for chest tightness and shortness of breath.   Cardiovascular:  Negative for chest pain, palpitations and leg swelling.  Gastrointestinal:  Positive for abdominal pain and diarrhea. Negative for constipation, nausea and vomiting.  Endocrine: Negative for polydipsia and polyuria.  Neurological:  Negative for dizziness, light-headedness, numbness and headaches.    Per HPI unless specifically indicated above     Objective:    BP 105/70   Pulse 82   Temp (!) 97.5 F (36.4 C) (Oral)   Wt 197 lb 12.8 oz (89.7 kg)   SpO2 98%   BMI 30.08 kg/m   Wt Readings from Last 3 Encounters:  07/28/22 197 lb 12.8 oz (89.7 kg)  07/12/22 198 lb (89.8 kg)  06/29/22 198 lb (89.8 kg)    Physical Exam Vitals and nursing note reviewed.  Constitutional:      General: He is not in acute distress.    Appearance: Normal appearance. He is not ill-appearing, toxic-appearing or diaphoretic.  HENT:     Head: Normocephalic.     Right Ear: External ear normal.     Left Ear: External  ear normal.     Nose: Nose normal. No congestion or rhinorrhea.     Mouth/Throat:     Mouth: Mucous membranes are moist.  Eyes:     General:        Right eye: No discharge.        Left eye: No discharge.     Extraocular Movements: Extraocular movements intact.     Conjunctiva/sclera: Conjunctivae normal.     Pupils: Pupils are equal, round, and reactive to light.  Cardiovascular:     Rate and Rhythm: Normal rate and regular rhythm.     Heart sounds: No murmur heard. Pulmonary:     Effort: Pulmonary effort is normal. No respiratory distress.     Breath sounds: Normal breath sounds. No wheezing, rhonchi or rales.  Abdominal:     General: Abdomen is flat. Bowel sounds are normal.     Tenderness: There is generalized abdominal tenderness.  Musculoskeletal:     Cervical back: Normal range of motion and neck supple.  Skin:    General: Skin is warm and dry.     Capillary Refill: Capillary refill takes less than 2 seconds.  Neurological:     General: No focal deficit present.     Mental Status: He is alert and oriented to person, place, and time.  Psychiatric:        Mood and Affect: Mood normal.        Behavior: Behavior normal.        Thought Content: Thought content normal.        Judgment: Judgment normal.     Results for orders placed or performed during the hospital encounter of 07/12/22  Resp panel by RT-PCR (RSV, Flu A&B, Covid) Anterior Nasal Swab   Specimen: Anterior Nasal Swab  Result Value Ref Range   SARS Coronavirus 2 by RT PCR NEGATIVE NEGATIVE   Influenza A by PCR POSITIVE (A) NEGATIVE   Influenza B by PCR NEGATIVE NEGATIVE   Resp Syncytial Virus by PCR NEGATIVE NEGATIVE      Assessment & Plan:   Problem List Items Addressed This Visit       Cardiovascular and Mediastinum   Hypertension associated with diabetes ([place]) - Primary    Chronic.  Controlled.  Continue with current medication regimen of Metoprolol and '25mg'$  and Valsartan '40mg'$ .   Having some  dizziness.  May need to decrease Valsartan in half if dizziness continues.  Return to clinic in 5 months for reevaluation.  Call sooner if concerns arise.          Endocrine   Hyperlipidemia associated with type 2 diabetes mellitus (HCC)    Chronic.  Controlled.  Continue with current medication regimen on Crestor '10mg'$  daily.  Labs ordered today.  Return to clinic in 5 months for reevaluation.  Call sooner if concerns arise.        Controlled diabetes mellitus type 2 with complications (HCC)    Chronic. Improved at last visit with an A1c 5.6%.  Will check labs today.  Microalbumin ordered. Continue with Metformin and Trulicity.  Follow up in 3 months for reevaluation. Call sooner if concerns arise.       Relevant Orders   Microalbumin, Urine Waived     Other   Erythrocytosis (Chronic)    Chronic. Followed by Hematology.  Has an appointment in February.  CBC checked today.        Other Visit Diagnoses     Diaphragmatic hernia without obstruction and without gangrene       Referral placed for patient to be evaluated by General Surgery.   Relevant Orders   Ambulatory referral to General Surgery        Follow up plan: Return in about 5 months (around 12/27/2022) for HTN, HLD, DM2 FU.

## 2022-07-28 NOTE — Assessment & Plan Note (Addendum)
Chronic.  Controlled.  Continue with current medication regimen of Metoprolol and '25mg'$  and Valsartan '40mg'$ .   Having some dizziness.  May need to decrease Valsartan in half if dizziness continues.  Return to clinic in 5 months for reevaluation.  Call sooner if concerns arise.

## 2022-07-28 NOTE — Assessment & Plan Note (Signed)
Chronic. Followed by Hematology.  Has an appointment in February.  CBC checked today.

## 2022-07-28 NOTE — Telephone Encounter (Signed)
[employer] denied the NPSG.  HST [employer] no auth req via auto machine ref # [id number]

## 2022-07-28 NOTE — Assessment & Plan Note (Signed)
Chronic.  Controlled.  Continue with current medication regimen on Crestor '10mg'$  daily.  Labs ordered today.  Return to clinic in 5 months for reevaluation.  Call sooner if concerns arise.

## 2022-07-29 LAB — MICROALBUMIN / CREATININE URINE RATIO
Creatinine, Urine: 89.8 mg/dL
Microalb Creat Ratio: 5 mg/g creat (ref 0–29)
Microalb, Ur: 4.5 ug/mL — ABNORMAL HIGH

## 2022-07-29 LAB — HEMOGLOBIN A1C
Hgb A1c MFr Bld: 5.7 % — ABNORMAL HIGH (ref 4.8–5.6)
Mean Plasma Glucose: 116.89 mg/dL

## 2022-07-29 NOTE — Progress Notes (Signed)
Hi Keith Barber.  Your lab worklooks good.  Your A1c is well controlled at 5.7.  Your blood counts did increase again, make sure you keep your appointment with Hematology for next month.  Continue with your current medication regimen.  Follow up as discussed.

## 2022-07-29 NOTE — Telephone Encounter (Signed)
HST- [employer] no auth req via auto machine ref # [id number].  Patient is scheduled at [facility] for 08/17/22 at 9:45 AM.  Anola Gurney message about appt info.

## 2022-07-30 NOTE — Progress Notes (Signed)
Microalbumin has significantly improved from prior.  Keep up the good work.

## 2022-08-02 ENCOUNTER — Ambulatory Visit: Payer: 59 | Admitting: Surgery

## 2022-08-02 ENCOUNTER — Other Ambulatory Visit: Payer: Self-pay

## 2022-08-02 ENCOUNTER — Encounter: Payer: Self-pay | Admitting: Surgery

## 2022-08-02 VITALS — BP 137/90 | HR 98 | Temp 98.6°F | Ht 68.0 in | Wt 199.0 lb

## 2022-08-02 DIAGNOSIS — M6208 Separation of muscle (nontraumatic), other site: Secondary | ICD-10-CM

## 2022-08-02 DIAGNOSIS — K449 Diaphragmatic hernia without obstruction or gangrene: Secondary | ICD-10-CM

## 2022-08-02 DIAGNOSIS — R109 Unspecified abdominal pain: Secondary | ICD-10-CM

## 2022-08-02 NOTE — Patient Instructions (Addendum)
CT scheduled . On 08/05/22 @ 12:30 at [facility].  Drink one bottle of water on your way there.   Please see your follow up appointment listed below.

## 2022-08-05 ENCOUNTER — Encounter: Payer: Self-pay | Admitting: Surgery

## 2022-08-05 ENCOUNTER — Other Ambulatory Visit: Payer: 59

## 2022-08-05 ENCOUNTER — Ambulatory Visit: Payer: 59

## 2022-08-05 ENCOUNTER — Ambulatory Visit: Payer: Self-pay | Admitting: Surgery

## 2022-08-05 NOTE — Progress Notes (Signed)
Patient ID: Keith Barber, male   DOB: [birth year], 52 y.o.   MRN: [id number]  HPI Keith Barber is a 52 y.o. male seen in consultation at the request of Mrs. Holdsworth NP for wart" hiatal hernia. Does report some occasional reflux belching.   Does report some intermittent abdominal pain that is mild that is dull and has no patterns.  He did have prior workup with an upper endoscopy as well as manometry in 2020.  This was normal.  His note that I have personally reviewed the EGD at that time in 2019 and there was no evidence of paraesophageal hernias. Did have recent CT scan a year and a half ago that I have personally reviewed showing no evidence of paraesophageal hernias.  No evidence of abdominal wall hernias. Of note I did perform bilateral for lapascopic bilateral inguinal hernia repair 5-1/2 years ago w [facility] , no issues related to that surgery. Have a normal CBC and CMP.  He is able to perform more than 4 METS of activity without shortness or chest pain.   HPI  Past Medical History:  Diagnosis Date   Bone spur    LEFT SHOULDER THAT MAKES HIS LEFT ARM TINGLE - had removed   Diverticulitis large intestine 09/2017   GERD (gastroesophageal reflux disease)    Hemorrhoids    Hyperlipidemia    Hypertension    Migraines    rare migraines    Past Surgical History:  Procedure Laterality Date   53 HOUR [place] STUDY N/A 05/30/2018   Procedure: 24 HOUR [place] STUDY;  Surgeon: Virgel Manifold, MD;  Location: [facility];  Service: Gastroenterology;  Laterality: N/A;   bicep tendon tear      COLONOSCOPY WITH PROPOFOL N/A 02/01/2018   Procedure: COLONOSCOPY WITH PROPOFOL;  Surgeon: Virgel Manifold, MD;  Location: [facility];  Service: Endoscopy;  Laterality: N/A;   ESOPHAGEAL MANOMETRY N/A 05/30/2018   Procedure: ESOPHAGEAL MANOMETRY (EM);  Surgeon: Virgel Manifold, MD;  Location: [facility];  Service: Gastroenterology;  Laterality: N/A;    ESOPHAGOGASTRODUODENOSCOPY (EGD) WITH PROPOFOL N/A 02/01/2018   Procedure: ESOPHAGOGASTRODUODENOSCOPY (EGD) WITH PROPOFOL;  Surgeon: Virgel Manifold, MD;  Location: [facility];  Service: Endoscopy;  Laterality: N/A;   INGUINAL HERNIA REPAIR Bilateral 01/21/2017   Procedure: LAPAROSCOPIC BILATERAL INGUINAL HERNIA REPAIR;  Surgeon: Jules Husbands, MD;  Location: [facility];  Service: General;  Laterality: Bilateral;   KNEE SURGERY Left    SHOULDER ARTHROSCOPY WITH DISTAL CLAVICLE RESECTION Left 07/05/2017   Procedure: LEFT SHOULDER ARTHROSCOPY, SUBACROMIAL DECOMPRESSION, DISTAL CLAVICLE RESECTION, POSSIBLE MINI OPEN ROTATOR CUFF REPAIR;  Surgeon: [name], MD;  Location: [place];  Service: Orthopedics;  Laterality: Left;   SHOULDER CLOSED REDUCTION Left 12/13/2017   Procedure: LEFT SHOULDER MANIPULATION with steroid injection;  Surgeon: [name], MD;  Location: [place];  Service: Orthopedics;  Laterality: Left;   SHOULDER SURGERY Right    UMBILICAL HERNIA REPAIR N/A 01/21/2017   Procedure: HERNIA REPAIR UMBILICAL ADULT;  Surgeon: Jules Husbands, MD;  Location: [facility];  Service: General;  Laterality: N/A;    Family History  Problem Relation Age of Onset   Diabetes Mother    Migraines Mother    Lung cancer Father    Lung cancer Paternal Grandfather    Cancer Neg Hx    COPD Neg Hx    Heart disease Neg Hx    Stroke Neg Hx    Sleep apnea Neg Hx     Social  History Social History   Tobacco Use   Smoking status: Former    Packs/day: 0.50    Years: 26.00    Total pack years: 13.00    Types: Cigarettes    Quit date: 01/13/2017    Years since quitting: 5.5   Smokeless tobacco: Never  Vaping Use   Vaping Use: Never used  Substance Use Topics   Alcohol use: No   Drug use: No    Allergies  Allergen Reactions   Amlodipine Itching    Current Outpatient Medications  Medication Sig Dispense Refill   cyclobenzaprine (FLEXERIL) 5 MG tablet Take 1 tablet (5 mg  total) by mouth 3 (three) times daily as needed for muscle spasms. 30 tablet 1   loperamide (IMODIUM) 2 MG capsule Take 2-4 mg by mouth as needed for diarrhea or loose stools.     metFORMIN (GLUCOPHAGE) 500 MG tablet Take 1 tablet (500 mg total) by mouth 2 (two) times daily with a meal. 360 tablet 1   metoprolol succinate (TOPROL-XL) 25 MG 24 hr tablet Take 1 tablet (25 mg total) by mouth daily. (Patient taking differently: Take 12.5 mg by mouth daily.) 90 tablet 1   naproxen sodium (ALEVE) 220 MG tablet Take 220 mg by mouth.     rizatriptan (MAXALT) 10 MG tablet Take 1 tablet by mouth twice daily as needed 180 tablet 0   rosuvastatin (CRESTOR) 10 MG tablet Take 1 tablet by mouth once daily 90 tablet 3   TRULICITY 2.99 ME/2.6ST SOPN INJECT 0.75 MG SUBCUTANEOUSLY ONCE A WEEK 8 mL 0   valsartan (DIOVAN) 40 MG tablet Take 1 tablet (40 mg total) by mouth daily. 90 tablet 1   No current facility-administered medications for this visit.     Review of Systems Full ROS  was asked and was negative except for the information on the HPI  Physical Exam Blood pressure (!) 137/90, pulse 98, temperature 98.6 F (37 C), temperature source Oral, height '5\' 8"'$  (1.727 m), weight 199 lb (90.3 kg), SpO2 96 %. CONSTITUTIONAL: NAD. EYES: Pupils are equal, round, and reactive to light, Sclera are non-icteric. EARS, NOSE, MOUTH AND THROAT: The oropharynx is clear. The oral mucosa is pink and moist. Hearing is intact to voice. LYMPH NODES:  Lymph nodes in the neck are normal. RESPIRATORY:  Lungs are clear. There is normal respiratory effort, with equal breath sounds bilaterally, and without pathologic use of accessory muscles. CARDIOVASCULAR: Heart is regular without murmurs, gallops, or rubs. GI: The abdomen is  soft, nontender, and nondistended.  There is diastases recti without definitive ventral defects,  there are no palpable masses. There is no hepatosplenomegaly. There are normal bowel sounds in all  quadrants. GU: Rectal deferred.   MUSCULOSKELETAL: Normal muscle strength and tone. No cyanosis or edema.   SKIN: Turgor is good and there are no pathologic skin lesions or ulcers. NEUROLOGIC: Motor and sensation is grossly normal. Cranial nerves are grossly intact. PSYCH:  Oriented to person, place and time. Affect is normal.  Data Reviewed  I have personally reviewed the patient's imaging, laboratory findings and medical records.    Assessment/Plan 52 year old male with some chronic abdominal complaints now presents with diastases recti.  Discussed with him in detail about benign findings.  At this time I do not feel a definitive ventral hernia defect.  I will be curious to know regarding it potential diaphragmatic hernia and I will like to investigate this further with a CT scan of the abdomen pelvis with contrast.  This time I do not see any evidence of immediate hospitalization or surgical intervention.  Before contemplating any repair for any hernias I would like to interrogate his abdominal wall as well as his mediastinal anatomy. Please note that I spent 45 minutes in this encounter including personally reviewing imaging studies, coordinating his care, placing orders and performing appropriate documentation.  Copy of this report was sent to the referring provider  Caroleen Hamman, MD FACS General Surgeon 08/05/2022, 3:47 PM

## 2022-08-06 ENCOUNTER — Other Ambulatory Visit: Payer: Self-pay | Admitting: Nurse Practitioner

## 2022-08-06 NOTE — Telephone Encounter (Signed)
Requested Prescriptions  Pending Prescriptions Disp Refills   TRULICITY 7.74 FS/2.3TR SOPN [Pharmacy Med Name: Trulicity 3.20 EB/3.4DH Subcutaneous Solution Pen-injector] 16 mL 0    Sig: INJECT 0.'75MG'$  SUBCUTANEOUSLY ONCE A WEEK     Endocrinology:  Diabetes - GLP-1 Receptor Agonists Passed - 08/06/2022  6:50 AM      Passed - HBA1C is between 0 and 7.9 and within 180 days    Hgb A1c MFr Bld  Date Value Ref Range Status  07/28/2022 5.7 (H) 4.8 - 5.6 % Final    Comment:    (NOTE) Pre diabetes:          5.7%-6.4%  Diabetes:              >6.4%  Glycemic control for   <7.0% adults with diabetes          Passed - Valid encounter within last 6 months    Recent Outpatient Visits           1 week ago Hypertension associated with diabetes [facility])   [place] Jon Billings, NP   3 months ago Hypertension associated with diabetes [facility])   [place] Jon Billings, NP   3 months ago Traumatic chest pain   [place], Erin E, PA-C   4 months ago Type 2 diabetes mellitus without complication, without long-term current use of insulin [facility])   [place] Jon Billings, NP   5 months ago Hypertension associated with diabetes [facility])   [place] Jon Billings, NP       Future Appointments             In 4 months Jon Billings, NP [place]

## 2022-08-10 ENCOUNTER — Ambulatory Visit
Admission: RE | Admit: 2022-08-10 | Discharge: 2022-08-10 | Disposition: A | Discharge status: Home / Self Care | Payer: 59 | Source: Ambulatory Visit | Attending: Surgery | Admitting: Surgery

## 2022-08-10 DIAGNOSIS — K449 Diaphragmatic hernia without obstruction or gangrene: Secondary | ICD-10-CM | POA: Insufficient documentation

## 2022-08-10 DIAGNOSIS — I700 Atherosclerosis of aorta: Secondary | ICD-10-CM | POA: Diagnosis not present

## 2022-08-10 DIAGNOSIS — K760 Fatty (change of) liver, not elsewhere classified: Secondary | ICD-10-CM | POA: Diagnosis not present

## 2022-08-10 DIAGNOSIS — R1032 Left lower quadrant pain: Secondary | ICD-10-CM | POA: Diagnosis not present

## 2022-08-10 MED ORDER — IOHEXOL 300 MG/ML  SOLN
100.0000 mL | Freq: Once | INTRAMUSCULAR | Status: AC | PRN
  Administered 2022-08-10: 100 mL via INTRAVENOUS

## 2022-08-11 ENCOUNTER — Ambulatory Visit: Payer: 59 | Admitting: Surgery

## 2022-08-11 ENCOUNTER — Encounter: Payer: Self-pay | Admitting: Surgery

## 2022-08-11 VITALS — BP 115/79 | HR 84 | Temp 98.6°F | Ht 69.0 in | Wt 201.0 lb

## 2022-08-11 DIAGNOSIS — R1084 Generalized abdominal pain: Secondary | ICD-10-CM

## 2022-08-11 DIAGNOSIS — K635 Polyp of colon: Secondary | ICD-10-CM

## 2022-08-11 DIAGNOSIS — Z1211 Encounter for screening for malignant neoplasm of colon: Secondary | ICD-10-CM

## 2022-08-11 NOTE — Patient Instructions (Signed)
A referral has been placed to [facility]. They will call you for an appointment.  If you have any concerns or questions, please feel free to call our office.   Diverticulosis  Diverticulosis is a condition that develops when small pouches (diverticula) form in the wall of the large intestine (colon). The colon is where water is absorbed and stool (feces) is formed. The pouches form when the inside layer of the colon pushes through weak spots in the outer layers of the colon. You may have a few pouches or many of them. The pouches usually do not cause problems unless they become inflamed or infected. When this happens, the condition is called diverticulitis. What are the causes? The cause of this condition is not known. What increases the risk? The following factors may make you more likely to develop this condition: Being older than age 22. Your risk for this condition increases with age. Diverticulosis is rare among people younger than age 34. By age 27, many people have it. Eating a low-fiber diet. Having frequent constipation. Being overweight. Not getting enough exercise. Smoking. Taking over-the-counter pain medicines, like aspirin and ibuprofen. Having a family history of diverticulosis. What are the signs or symptoms? In most people, there are no symptoms of this condition. If you do have symptoms, they may include: Bloating. Cramps in the abdomen. Constipation or diarrhea. Pain in the lower left side of the abdomen. How is this diagnosed? Because diverticulosis usually has no symptoms, it is most often diagnosed during an exam for other colon problems. The condition may be diagnosed by: Using a flexible scope to examine the colon (colonoscopy). Taking an X-ray of the colon after dye has been put into the colon (barium enema). Having a CT scan. How is this treated? You may not need treatment for this condition. Your health care provider may recommend treatment to prevent  problems. You may need treatment if you have symptoms or if you previously had diverticulitis. Treatment may include: Eating a high-fiber diet. Taking a fiber supplement. Taking a live bacteria supplement (probiotic). Taking medicine to relax your colon. Follow these instructions at home: Medicines Take over-the-counter and prescription medicines only as told by your health care provider. If told by your health care provider, take a fiber supplement or probiotic. Constipation prevention Your condition may cause constipation. To prevent or treat constipation, you may need to: Drink enough fluid to keep your urine pale yellow. Take over-the-counter or prescription medicines. Eat foods that are high in fiber, such as beans, whole grains, and fresh fruits and vegetables. Limit foods that are high in fat and processed sugars, such as fried or sweet foods.  General instructions Try not to strain when you have a bowel movement. Keep all follow-up visits as told by your health care provider. This is important. Contact a health care provider if you: Have pain in your abdomen. Have bloating. Have cramps. Have not had a bowel movement in 3 days. Get help right away if: Your pain gets worse. Your bloating becomes very bad. You have a fever or chills, and your symptoms suddenly get worse. You vomit. You have bowel movements that are bloody or black. You have bleeding from your rectum. Summary Diverticulosis is a condition that develops when small pouches (diverticula) form in the wall of the large intestine (colon). You may have a few pouches or many of them. This condition is most often diagnosed during an exam for other colon problems. Treatment may include increasing the fiber in  your diet, taking supplements, or taking medicines. This information is not intended to replace advice given to you by your health care provider. Make sure you discuss any questions you have with your health care  provider. Document Revised: 02/01/2019 Document Reviewed: 02/01/2019 Elsevier Patient Education  [place].

## 2022-08-12 ENCOUNTER — Encounter: Payer: Self-pay | Admitting: Oncology

## 2022-08-12 ENCOUNTER — Other Ambulatory Visit: Payer: Self-pay

## 2022-08-12 ENCOUNTER — Telehealth: Payer: Self-pay

## 2022-08-12 DIAGNOSIS — Z86010 Personal history of colonic polyps: Secondary | ICD-10-CM

## 2022-08-12 MED ORDER — NA SULFATE-K SULFATE-MG SULF 17.5-3.13-1.6 GM/177ML PO SOLN: 1.0000 | Freq: Once | ORAL | 0 refills | Status: AC

## 2022-08-12 NOTE — Progress Notes (Signed)
Outpatient Surgical Follow Up  08/12/2022  Keith Barber is an 52 y.o. male.   Chief Complaint  Patient presents with   Follow-up    Diaphragmatic hernia    HPI: Keith Barber is following up for questionable diaphragmatic hernia and some nonspecific abdominal pain.  He states that he is main concern was a diastases recti.  He has some occasional abdominal pains nonspecific for anything.  He did have a CT scan perrs reviewed showing no evidence of inguinal or diaphragmatic hernias, hepatic steatosis.  Does have evidence of chronic diverticulosis.  I also pers reviewed images  his colonoscopy and the last colonoscopy was 5 years ago with some polyps.  He has not follow-up with GI  Past Medical History:  Diagnosis Date   Bone spur    LEFT SHOULDER THAT MAKES HIS LEFT ARM TINGLE - had removed   Diverticulitis large intestine 09/2017   GERD (gastroesophageal reflux disease)    Hemorrhoids    Hyperlipidemia    Hypertension    Migraines    rare migraines    Past Surgical History:  Procedure Laterality Date   74 HOUR [place] STUDY N/A 05/30/2018   Procedure: 24 HOUR [place] STUDY;  Surgeon: Virgel Manifold, MD;  Location: [facility];  Service: Gastroenterology;  Laterality: N/A;   bicep tendon tear      COLONOSCOPY WITH PROPOFOL N/A 02/01/2018   Procedure: COLONOSCOPY WITH PROPOFOL;  Surgeon: Virgel Manifold, MD;  Location: [facility];  Service: Endoscopy;  Laterality: N/A;   ESOPHAGEAL MANOMETRY N/A 05/30/2018   Procedure: ESOPHAGEAL MANOMETRY (EM);  Surgeon: Virgel Manifold, MD;  Location: [facility];  Service: Gastroenterology;  Laterality: N/A;   ESOPHAGOGASTRODUODENOSCOPY (EGD) WITH PROPOFOL N/A 02/01/2018   Procedure: ESOPHAGOGASTRODUODENOSCOPY (EGD) WITH PROPOFOL;  Surgeon: Virgel Manifold, MD;  Location: [facility];  Service: Endoscopy;  Laterality: N/A;   INGUINAL HERNIA REPAIR Bilateral 01/21/2017   Procedure: LAPAROSCOPIC BILATERAL INGUINAL HERNIA  REPAIR;  Surgeon: Jules Husbands, MD;  Location: [facility];  Service: General;  Laterality: Bilateral;   KNEE SURGERY Left    SHOULDER ARTHROSCOPY WITH DISTAL CLAVICLE RESECTION Left 07/05/2017   Procedure: LEFT SHOULDER ARTHROSCOPY, SUBACROMIAL DECOMPRESSION, DISTAL CLAVICLE RESECTION, POSSIBLE MINI OPEN ROTATOR CUFF REPAIR;  Surgeon: [name], MD;  Location: [place];  Service: Orthopedics;  Laterality: Left;   SHOULDER CLOSED REDUCTION Left 12/13/2017   Procedure: LEFT SHOULDER MANIPULATION with steroid injection;  Surgeon: [name], MD;  Location: [place];  Service: Orthopedics;  Laterality: Left;   SHOULDER SURGERY Right    UMBILICAL HERNIA REPAIR N/A 01/21/2017   Procedure: HERNIA REPAIR UMBILICAL ADULT;  Surgeon: Jules Husbands, MD;  Location: [facility];  Service: General;  Laterality: N/A;    Family History  Problem Relation Age of Onset   Diabetes Mother    Migraines Mother    Lung cancer Father    Lung cancer Paternal Grandfather    Cancer Neg Hx    COPD Neg Hx    Heart disease Neg Hx    Stroke Neg Hx    Sleep apnea Neg Hx     Social History:  reports that he quit smoking about 5 years ago. His smoking use included cigarettes. He has a 13.00 pack-year smoking history. He has never used smokeless tobacco. He reports that he does not drink alcohol and does not use drugs.  Allergies:  Allergies  Allergen Reactions   Amlodipine Itching    Medications reviewed.    ROS Full  ROS performed and is otherwise negative other than what is stated in HPI   BP 115/79   Pulse 84   Temp 98.6 F (37 C) (Oral)   Ht '5\' 9"'$  (1.753 m)   Wt 201 lb (91.2 kg)   SpO2 96%   BMI 29.68 kg/m   Physical Exam  CONSTITUTIONAL: NAD. EYES: Pupils are equal, round, and reactive to light, Sclera are non-icteric. EARS, NOSE, MOUTH AND THROAT: The oropharynx is clear. The oral mucosa is pink and moist. Hearing is intact to voice. LYMPH NODES:  Lymph nodes in the neck are  normal. RESPIRATORY:  Lungs are clear. There is normal respiratory effort, with equal breath sounds bilaterally, and without pathologic use of accessory muscles. CARDIOVASCULAR: Heart is regular without murmurs, gallops, or rubs. GI: The abdomen is  soft, nontender, and nondistended.  There is diastases recti without definitive ventral defects,  there are no palpable masses. There is no hepatosplenomegaly. There are normal bowel sounds in all quadrants. GU: Rectal deferred.   MUSCULOSKELETAL: Normal muscle strength and tone. No cyanosis or edema.   SKIN: Turgor is good and there are no pathologic skin lesions or ulcers. NEUROLOGIC: Motor and sensation is grossly normal. Cranial nerves are grossly intact. PSYCH:  Oriented to person, place and time. Affect is normal.   No results found for this or any previous visit (from the past 48 hour(s)). CT Abdomen Pelvis W Contrast  Result Date: 08/10/2022 CLINICAL DATA:  Hernia above umbilicus. Occasional left lower quadrant abdominal pain. History of bilateral inguinal hernias and umbilical hernia repair. EXAM: CT ABDOMEN AND PELVIS WITH CONTRAST TECHNIQUE: Multidetector CT imaging of the abdomen and pelvis was performed using the standard protocol following bolus administration of intravenous contrast. RADIATION DOSE REDUCTION: This exam was performed according to the departmental dose-optimization program which includes automated exposure control, adjustment of the mA and/or kV according to patient size and/or use of iterative reconstruction technique. CONTRAST:  187m OMNIPAQUE IOHEXOL 300 MG/ML  SOLN COMPARISON:  Multiple priors including most recent CT January 06, 2021 FINDINGS: Lower chest: No acute abnormality. Hepatobiliary: Hepatomegaly measuring 19.2 cm in maximum craniocaudal dimension. Diffuse hepatic steatosis with focal fatty sparing along the gallbladder fossa. Gallbladder is unremarkable. No biliary ductal dilation. Pancreas: No pancreatic ductal  dilation or evidence of acute inflammation. Spleen: No splenomegaly. Adrenals/Urinary Tract: Bilateral adrenal glands appear normal. No hydronephrosis. Kidneys demonstrate symmetric enhancement and excretion of contrast material. Urinary bladder is unremarkable for degree of distension. Stomach/Bowel: Stomach is nondistended limiting evaluation. No pathologic dilation of small or large bowel. The appendix and terminal ileum appear normal. Moderate volume of formed stool in the colon. Left-sided colonic diverticulosis without findings of acute diverticulitis. Vascular/Lymphatic: Aortic atherosclerosis. Normal caliber abdominal aorta. Smooth IVC contours. No pathologically enlarged abdominal or pelvic lymph nodes. Reproductive: Prostate is unremarkable. Other: Prior bilateral inguinal and umbilical hernia repairs. No abdominal wall hernia identified. Musculoskeletal: Chronic bilateral L5 pars defects without listhesis. Multilevel degenerative changes spine. IMPRESSION: 1. No acute abnormality identified within the abdomen or specifically, no abdominal wall hernia visualized. 2. Left-sided colonic diverticulosis without findings of acute diverticulitis. 3. Hepatomegaly with diffuse hepatic steatosis. 4.  Aortic Atherosclerosis (ICD10-I70.0). Electronically Signed   By: JDahlia BailiffM.D.   On: 08/10/2022 17:36    Assessment/Plan: 52yo w  colon polyps and functional abdominal pain.  No evidence of surgical issues at this time.  We will let GI again evaluate him and he would likely need a repeat colonoscopy.  Nothing for  [place] to do from a general surgery standpoint.   I spent 30 minutes in this encounter including personally reviewing imaging studies, coordination of his care, counseling, reviewing medical records, placing orders and performing appropriate documentation   Caroleen Hamman, MD [place] Surgeon

## 2022-08-12 NOTE — Telephone Encounter (Signed)
Gastroenterology Pre-Procedure Review  Request Date: 08/30/22 Requesting Physician: Dr. Vicente Males  PATIENT REVIEW QUESTIONS: The patient responded to the following health history questions as indicated:    1. Are you having any GI issues?  Pain in lower left side, history of diverticulosis-pt stated that it was noted on his CT Scan.  Unsure if Gastro 2. Do you have a personal history of Polyps? yes (5 years ago with Dr. Bonna Gains ) 3. Do you have a family history of Colon Cancer or Polyps? no 4. Diabetes Mellitus? yes (type 2 takes Trulicity on Sundays has been advised to stop on 08/22/22 and Metformin pt advised to stop on 02/10) 5. Joint replacements in the past 12 months?no 6. Major health problems in the past 3 months?no 7. Any artificial heart valves, MVP, or defibrillator?no    MEDICATIONS & ALLERGIES:    Patient reports the following regarding taking any anticoagulation/antiplatelet therapy:   Plavix, Coumadin, Eliquis, Xarelto, Lovenox, Pradaxa, Brilinta, or Effient? no Aspirin? no  Patient confirms/reports the following medications:  Current Outpatient Medications  Medication Sig Dispense Refill   cyclobenzaprine (FLEXERIL) 5 MG tablet Take 1 tablet (5 mg total) by mouth 3 (three) times daily as needed for muscle spasms. 30 tablet 1   loperamide (IMODIUM) 2 MG capsule Take 2-4 mg by mouth as needed for diarrhea or loose stools.     metFORMIN (GLUCOPHAGE) 500 MG tablet Take 1 tablet (500 mg total) by mouth 2 (two) times daily with a meal. 360 tablet 1   metoprolol succinate (TOPROL-XL) 25 MG 24 hr tablet Take 1 tablet (25 mg total) by mouth daily. (Patient taking differently: Take 12.5 mg by mouth daily.) 90 tablet 1   naproxen sodium (ALEVE) 220 MG tablet Take 220 mg by mouth.     rizatriptan (MAXALT) 10 MG tablet Take 1 tablet by mouth twice daily as needed 180 tablet 0   rosuvastatin (CRESTOR) 10 MG tablet Take 1 tablet by mouth once daily 90 tablet 3   TRULICITY 5.28 UX/3.2GM  SOPN INJECT 0.'75MG'$  SUBCUTANEOUSLY ONCE A WEEK 16 mL 0   valsartan (DIOVAN) 40 MG tablet Take 1 tablet (40 mg total) by mouth daily. 90 tablet 1   No current facility-administered medications for this visit.    Patient confirms/reports the following allergies:  Allergies  Allergen Reactions   Amlodipine Itching    No orders of the defined types were placed in this encounter.   AUTHORIZATION INFORMATION Primary Insurance: 1D#: Group #:  Secondary Insurance: 1D#: Group #:  SCHEDULE INFORMATION: Date: 08/30/22 Time: Location: [facility]

## 2022-08-17 ENCOUNTER — Ambulatory Visit: Payer: 59 | Admitting: Neurology

## 2022-08-17 DIAGNOSIS — G4719 Other hypersomnia: Secondary | ICD-10-CM

## 2022-08-17 DIAGNOSIS — Z9189 Other specified personal risk factors, not elsewhere classified: Secondary | ICD-10-CM

## 2022-08-17 DIAGNOSIS — R0683 Snoring: Secondary | ICD-10-CM

## 2022-08-17 DIAGNOSIS — E669 Obesity, unspecified: Secondary | ICD-10-CM

## 2022-08-17 DIAGNOSIS — D751 Secondary polycythemia: Secondary | ICD-10-CM

## 2022-08-17 DIAGNOSIS — R0681 Apnea, not elsewhere classified: Secondary | ICD-10-CM

## 2022-08-17 DIAGNOSIS — R351 Nocturia: Secondary | ICD-10-CM

## 2022-08-17 DIAGNOSIS — R519 Headache, unspecified: Secondary | ICD-10-CM

## 2022-08-19 ENCOUNTER — Ambulatory Visit: Payer: 59 | Admitting: Neurology

## 2022-08-19 ENCOUNTER — Telehealth: Payer: Self-pay

## 2022-08-19 DIAGNOSIS — G4719 Other hypersomnia: Secondary | ICD-10-CM

## 2022-08-19 DIAGNOSIS — D751 Secondary polycythemia: Secondary | ICD-10-CM

## 2022-08-19 DIAGNOSIS — G4733 Obstructive sleep apnea (adult) (pediatric): Secondary | ICD-10-CM | POA: Diagnosis not present

## 2022-08-19 DIAGNOSIS — E669 Obesity, unspecified: Secondary | ICD-10-CM

## 2022-08-19 DIAGNOSIS — R0683 Snoring: Secondary | ICD-10-CM

## 2022-08-19 DIAGNOSIS — Z9189 Other specified personal risk factors, not elsewhere classified: Secondary | ICD-10-CM

## 2022-08-19 DIAGNOSIS — R0681 Apnea, not elsewhere classified: Secondary | ICD-10-CM

## 2022-08-19 DIAGNOSIS — R519 Headache, unspecified: Secondary | ICD-10-CM

## 2022-08-19 DIAGNOSIS — R351 Nocturia: Secondary | ICD-10-CM

## 2022-08-19 NOTE — Telephone Encounter (Signed)
Pt picked up HST on 08/17/22. Unfortunately no data was recorded on device. Pt picked up another HST today,08/19/22, to try again. Pt is aware to return HST on 08/20/22.

## 2022-08-23 ENCOUNTER — Encounter: Payer: Self-pay | Admitting: Oncology

## 2022-08-23 ENCOUNTER — Inpatient Hospital Stay: Payer: 59

## 2022-08-23 ENCOUNTER — Inpatient Hospital Stay: Payer: 59 | Attending: Oncology

## 2022-08-23 ENCOUNTER — Inpatient Hospital Stay (HOSPITAL_BASED_OUTPATIENT_CLINIC_OR_DEPARTMENT_OTHER): Payer: 59 | Admitting: Oncology

## 2022-08-23 VITALS — BP 126/94 | HR 83 | Temp 98.0°F | Resp 18 | Wt 204.2 lb

## 2022-08-23 DIAGNOSIS — R160 Hepatomegaly, not elsewhere classified: Secondary | ICD-10-CM | POA: Diagnosis not present

## 2022-08-23 DIAGNOSIS — Z87891 Personal history of nicotine dependence: Secondary | ICD-10-CM | POA: Diagnosis not present

## 2022-08-23 DIAGNOSIS — D751 Secondary polycythemia: Secondary | ICD-10-CM | POA: Diagnosis not present

## 2022-08-23 DIAGNOSIS — Z833 Family history of diabetes mellitus: Secondary | ICD-10-CM | POA: Diagnosis not present

## 2022-08-23 DIAGNOSIS — Z8719 Personal history of other diseases of the digestive system: Secondary | ICD-10-CM | POA: Diagnosis not present

## 2022-08-23 DIAGNOSIS — E785 Hyperlipidemia, unspecified: Secondary | ICD-10-CM | POA: Diagnosis not present

## 2022-08-23 DIAGNOSIS — Z7985 Long-term (current) use of injectable non-insulin antidiabetic drugs: Secondary | ICD-10-CM | POA: Diagnosis not present

## 2022-08-23 DIAGNOSIS — G43909 Migraine, unspecified, not intractable, without status migrainosus: Secondary | ICD-10-CM | POA: Diagnosis not present

## 2022-08-23 DIAGNOSIS — Z8249 Family history of ischemic heart disease and other diseases of the circulatory system: Secondary | ICD-10-CM | POA: Insufficient documentation

## 2022-08-23 DIAGNOSIS — Z79899 Other long term (current) drug therapy: Secondary | ICD-10-CM | POA: Insufficient documentation

## 2022-08-23 DIAGNOSIS — Z7984 Long term (current) use of oral hypoglycemic drugs: Secondary | ICD-10-CM | POA: Diagnosis not present

## 2022-08-23 DIAGNOSIS — E119 Type 2 diabetes mellitus without complications: Secondary | ICD-10-CM | POA: Diagnosis not present

## 2022-08-23 DIAGNOSIS — I10 Essential (primary) hypertension: Secondary | ICD-10-CM | POA: Diagnosis not present

## 2022-08-23 DIAGNOSIS — Z801 Family history of malignant neoplasm of trachea, bronchus and lung: Secondary | ICD-10-CM | POA: Insufficient documentation

## 2022-08-23 DIAGNOSIS — K219 Gastro-esophageal reflux disease without esophagitis: Secondary | ICD-10-CM | POA: Diagnosis not present

## 2022-08-23 DIAGNOSIS — K760 Fatty (change of) liver, not elsewhere classified: Secondary | ICD-10-CM | POA: Insufficient documentation

## 2022-08-23 DIAGNOSIS — R1032 Left lower quadrant pain: Secondary | ICD-10-CM | POA: Diagnosis not present

## 2022-08-23 DIAGNOSIS — Z888 Allergy status to other drugs, medicaments and biological substances status: Secondary | ICD-10-CM | POA: Diagnosis not present

## 2022-08-23 DIAGNOSIS — M1990 Unspecified osteoarthritis, unspecified site: Secondary | ICD-10-CM | POA: Diagnosis not present

## 2022-08-23 DIAGNOSIS — R5383 Other fatigue: Secondary | ICD-10-CM | POA: Diagnosis not present

## 2022-08-23 DIAGNOSIS — Z6830 Body mass index (BMI) 30.0-30.9, adult: Secondary | ICD-10-CM | POA: Diagnosis not present

## 2022-08-23 DIAGNOSIS — I700 Atherosclerosis of aorta: Secondary | ICD-10-CM | POA: Insufficient documentation

## 2022-08-23 DIAGNOSIS — R110 Nausea: Secondary | ICD-10-CM | POA: Diagnosis not present

## 2022-08-23 DIAGNOSIS — E669 Obesity, unspecified: Secondary | ICD-10-CM | POA: Diagnosis not present

## 2022-08-23 LAB — CBC WITH DIFFERENTIAL/PLATELET
Abs Immature Granulocytes: 0.03 10*3/uL (ref 0.00–0.07)
Basophils Absolute: 0 10*3/uL (ref 0.0–0.1)
Basophils Relative: 0 %
Eosinophils Absolute: 0.1 10*3/uL (ref 0.0–0.5)
Eosinophils Relative: 2 %
HCT: 48.4 % (ref 39.0–52.0)
Hemoglobin: 16.9 g/dL (ref 13.0–17.0)
Immature Granulocytes: 0 %
Lymphocytes Relative: 29 %
Lymphs Abs: 2.7 10*3/uL (ref 0.7–4.0)
MCH: 30.1 pg (ref 26.0–34.0)
MCHC: 34.9 g/dL (ref 30.0–36.0)
MCV: 86.1 fL (ref 80.0–100.0)
Monocytes Absolute: 0.7 10*3/uL (ref 0.1–1.0)
Monocytes Relative: 8 %
Neutro Abs: 5.5 10*3/uL (ref 1.7–7.7)
Neutrophils Relative %: 61 %
Platelets: 191 10*3/uL (ref 150–400)
RBC: 5.62 MIL/uL (ref 4.22–5.81)
RDW: 12.8 % (ref 11.5–15.5)
WBC: 9.1 10*3/uL (ref 4.0–10.5)
nRBC: 0 % (ref 0.0–0.2)

## 2022-08-23 LAB — HM DIABETES EYE EXAM

## 2022-08-23 NOTE — Progress Notes (Signed)
Hematology/Oncology Progress note [phone] [phone]     CHIEF COMPLAINTS/REASON FOR VISIT:  Follow up for erythrocytosis  ASSESSMENT & PLAN:   Erythrocytosis Labs are reviewed and discussed with patient. Hb has improved, Hct <52, hold off phlebotomy.  normal erythropoietin, slightly elevated carbo monoxide level JAK2 V617F mutation negative, with reflex to other mutations CALR, MPL, JAK 2 Ex 12-15 mutations negative. negative BCR-ABL1 FISH.   Awaiting sleep study results.  No phlebotomy today Patient will call our office if sleep study is negative and no explanation is found for his erythrocytosis. .    Orders Placed This Encounter  Procedures   CBC with Differential/Platelet    Standing Status:   Future    Standing Expiration Date:   08/24/2023   Follow up PRN All questions were answered. The patient knows to call the clinic with any problems, questions or concerns.  Earlie Server, MD, PhD [facility] Hematology Oncology 08/23/2022      HISTORY OF PRESENTING ILLNESS:   Keith Barber is a  52 y.o.  male who presents for follow up of erythrocytosis.    Patient has abnormal CBC with hemoglobin of 18.5, Hct 55,  Reviewed patient's previous labs.  elevated hemoglobin is chronic onset, dated back to 2018.   Patient denies unintentional weight loss, fever, chills, fatigue, night sweats, cough, SOB, chest pain.  Former smoker, 13 pack year smoking history,  Denies previous VTE history. Denies known history of sleep apnea. He denies any testosterone use.   + fatigue, not refreshed when he wakes up in the morning. + nausea, no vomiting, improves as day goes along.  Family history of small cell lung cancer.    INTERVAL HISTORY Keith Barber is a 52 y.o. male who has above history reviewed by me today presents for follow up visit for erythrocytosis.  He recently has left lower quadrant pain.  08/10/22 CT abdomen pelvis showed   1. No  acute abnormality identified within the abdomen or specifically, no abdominal wall hernia visualized. 2. Left-sided colonic diverticulosis without findings of acute diverticulitis. 3. Hepatomegaly with diffuse hepatic steatosis. 4.  Aortic Atherosclerosis   He denies any blood in stool, fever or chills. He has had home oxygen testing and is waiting for results.   MEDICAL HISTORY:  Past Medical History:  Diagnosis Date   Bone spur    LEFT SHOULDER THAT MAKES HIS LEFT ARM TINGLE - had removed   Diverticulitis large intestine 09/2017   GERD (gastroesophageal reflux disease)    Hemorrhoids    Hyperlipidemia    Hypertension    Migraines    rare migraines    SURGICAL HISTORY: Past Surgical History:  Procedure Laterality Date   75 HOUR [place] STUDY N/A 05/30/2018   Procedure: 24 HOUR [place] STUDY;  Surgeon: Virgel Manifold, MD;  Location: [facility];  Service: Gastroenterology;  Laterality: N/A;   bicep tendon tear      COLONOSCOPY WITH PROPOFOL N/A 02/01/2018   Procedure: COLONOSCOPY WITH PROPOFOL;  Surgeon: Virgel Manifold, MD;  Location: [facility];  Service: Endoscopy;  Laterality: N/A;   ESOPHAGEAL MANOMETRY N/A 05/30/2018   Procedure: ESOPHAGEAL MANOMETRY (EM);  Surgeon: Virgel Manifold, MD;  Location: [facility];  Service: Gastroenterology;  Laterality: N/A;   ESOPHAGOGASTRODUODENOSCOPY (EGD) WITH PROPOFOL N/A 02/01/2018   Procedure: ESOPHAGOGASTRODUODENOSCOPY (EGD) WITH PROPOFOL;  Surgeon: Virgel Manifold, MD;  Location: [facility];  Service: Endoscopy;  Laterality: N/A;   INGUINAL HERNIA REPAIR Bilateral 01/21/2017   Procedure:  LAPAROSCOPIC BILATERAL INGUINAL HERNIA REPAIR;  Surgeon: Jules Husbands, MD;  Location: [facility];  Service: General;  Laterality: Bilateral;   KNEE SURGERY Left    SHOULDER ARTHROSCOPY WITH DISTAL CLAVICLE RESECTION Left 07/05/2017   Procedure: LEFT SHOULDER ARTHROSCOPY, SUBACROMIAL DECOMPRESSION, DISTAL CLAVICLE RESECTION,  POSSIBLE MINI OPEN ROTATOR CUFF REPAIR;  Surgeon: [name], MD;  Location: [place];  Service: Orthopedics;  Laterality: Left;   SHOULDER CLOSED REDUCTION Left 12/13/2017   Procedure: LEFT SHOULDER MANIPULATION with steroid injection;  Surgeon: [name], MD;  Location: [place];  Service: Orthopedics;  Laterality: Left;   SHOULDER SURGERY Right    UMBILICAL HERNIA REPAIR N/A 01/21/2017   Procedure: HERNIA REPAIR UMBILICAL ADULT;  Surgeon: Jules Husbands, MD;  Location: [facility];  Service: General;  Laterality: N/A;    SOCIAL HISTORY: Social History   Socioeconomic History   Marital status: Divorced    Spouse name: Not on file   Number of children: Not on file   Years of education: Not on file   Highest education level: Not on file  Occupational History   Not on file  Tobacco Use   Smoking status: Former    Packs/day: 0.50    Years: 26.00    Total pack years: 13.00    Types: Cigarettes    Quit date: 01/13/2017    Years since quitting: 5.6   Smokeless tobacco: Never  Vaping Use   Vaping Use: Never used  Substance and Sexual Activity   Alcohol use: No   Drug use: No   Sexual activity: Yes  Other Topics Concern   Not on file  Social History Narrative   Not on file   Social Determinants of Health   Financial Resource Strain: Not on file  Food Insecurity: Not on file  Transportation Needs: Not on file  Physical Activity: Not on file  Stress: Not on file  Social Connections: Not on file  Intimate Partner Violence: Not on file    FAMILY HISTORY: Family History  Problem Relation Age of Onset   Diabetes Mother    Migraines Mother    Lung cancer Father    Lung cancer Paternal Grandfather    Cancer Neg Hx    COPD Neg Hx    Heart disease Neg Hx    Stroke Neg Hx    Sleep apnea Neg Hx     ALLERGIES:  is allergic to amlodipine.  MEDICATIONS:  Current Outpatient Medications  Medication Sig Dispense Refill   cyclobenzaprine (FLEXERIL) 5 MG tablet Take 1  tablet (5 mg total) by mouth 3 (three) times daily as needed for muscle spasms. 30 tablet 1   loperamide (IMODIUM) 2 MG capsule Take 2-4 mg by mouth as needed for diarrhea or loose stools.     metFORMIN (GLUCOPHAGE) 500 MG tablet Take 1 tablet (500 mg total) by mouth 2 (two) times daily with a meal. 360 tablet 1   metoprolol succinate (TOPROL-XL) 25 MG 24 hr tablet Take 1 tablet (25 mg total) by mouth daily. (Patient taking differently: Take 12.5 mg by mouth daily.) 90 tablet 1   naproxen sodium (ALEVE) 220 MG tablet Take 220 mg by mouth.     rizatriptan (MAXALT) 10 MG tablet Take 1 tablet by mouth twice daily as needed 180 tablet 0   rosuvastatin (CRESTOR) 10 MG tablet Take 1 tablet by mouth once daily 90 tablet 3   TRULICITY 4.17 EY/8.1KG SOPN INJECT 0.'75MG'$  SUBCUTANEOUSLY ONCE A WEEK 16 mL 0  valsartan (DIOVAN) 40 MG tablet Take 1 tablet (40 mg total) by mouth daily. 90 tablet 1   No current facility-administered medications for this visit.     Review of Systems  Constitutional:  Positive for fatigue. Negative for appetite change, chills, fever and unexpected weight change.  HENT:   Negative for hearing loss and voice change.   Eyes:  Negative for eye problems and icterus.  Respiratory:  Negative for chest tightness, cough and shortness of breath.   Cardiovascular:  Negative for chest pain and leg swelling.  Gastrointestinal:  Negative for abdominal distention, abdominal pain and nausea.  Endocrine: Negative for hot flashes.  Genitourinary:  Negative for difficulty urinating, dysuria and frequency.   Musculoskeletal:  Negative for arthralgias.  Skin:  Negative for itching and rash.  Neurological:  Negative for light-headedness and numbness.  Hematological:  Negative for adenopathy. Does not bruise/bleed easily.  Psychiatric/Behavioral:  Negative for confusion.     PHYSICAL EXAMINATION: ECOG PERFORMANCE STATUS: 0 - Asymptomatic Vitals:   08/23/22 1341  BP: (!) 126/94  Pulse: 83   Resp: 18  Temp: 98 F (36.7 C)   Filed Weights   08/23/22 1341  Weight: 204 lb 3.2 oz (92.6 kg)    Physical Exam Constitutional:      General: He is not in acute distress.    Appearance: He is obese.  HENT:     Head: Normocephalic.  Eyes:     General: No scleral icterus. Cardiovascular:     Rate and Rhythm: Normal rate.  Pulmonary:     Effort: Pulmonary effort is normal. No respiratory distress.  Abdominal:     General: There is no distension.  Musculoskeletal:        General: Normal range of motion.     Cervical back: Normal range of motion.  Skin:    Findings: No rash.  Neurological:     Mental Status: He is alert and oriented to person, place, and time. Mental status is at baseline.     Cranial Nerves: No cranial nerve deficit.  Psychiatric:        Mood and Affect: Mood normal.     LABORATORY DATA:  I have reviewed the data as listed    Latest Ref Rng & Units 08/23/2022    1:07 PM 07/28/2022   11:12 AM 05/20/2022    2:02 PM  CBC  WBC 4.0 - 10.5 K/uL 9.1  9.5    Hemoglobin 13.0 - 17.0 g/dL 16.9  17.6  16.1   Hematocrit 39.0 - 52.0 % 48.4  52.3  47.5   Platelets 150 - 400 K/uL 191  222        Latest Ref Rng & Units 07/28/2022   11:12 AM 04/27/2022    9:16 AM 01/18/2022   10:53 AM  CMP  Glucose 70 - 99 mg/dL 125  105  137   BUN 6 - 20 mg/dL '13  16  10   '$ Creatinine 0.61 - 1.24 mg/dL 1.06  0.91  0.92   Sodium 135 - 145 mmol/L 138  140  140   Potassium 3.5 - 5.1 mmol/L 4.6  4.1  3.8   Chloride 98 - 111 mmol/L 103  109  107   CO2 22 - 32 mmol/L '26  27  27   '$ Calcium 8.9 - 10.3 mg/dL 9.4  8.6  9.1   Total Protein 6.5 - 8.1 g/dL 8.3  7.6  7.8   Total Bilirubin 0.3 - 1.2 mg/dL 1.0  0.7  1.3   Alkaline Phos 38 - 126 U/L 104  109  113   AST 15 - 41 U/L '29  29  31   '$ ALT 0 - 44 U/L 35  41  39      RADIOGRAPHIC STUDIES: I have personally reviewed the radiological images as listed and agreed with the findings in the report. CT Abdomen Pelvis W Contrast  Result  Date: 08/10/2022 CLINICAL DATA:  Hernia above umbilicus. Occasional left lower quadrant abdominal pain. History of bilateral inguinal hernias and umbilical hernia repair. EXAM: CT ABDOMEN AND PELVIS WITH CONTRAST TECHNIQUE: Multidetector CT imaging of the abdomen and pelvis was performed using the standard protocol following bolus administration of intravenous contrast. RADIATION DOSE REDUCTION: This exam was performed according to the departmental dose-optimization program which includes automated exposure control, adjustment of the mA and/or kV according to patient size and/or use of iterative reconstruction technique. CONTRAST:  134m OMNIPAQUE IOHEXOL 300 MG/ML  SOLN COMPARISON:  Multiple priors including most recent CT January 06, 2021 FINDINGS: Lower chest: No acute abnormality. Hepatobiliary: Hepatomegaly measuring 19.2 cm in maximum craniocaudal dimension. Diffuse hepatic steatosis with focal fatty sparing along the gallbladder fossa. Gallbladder is unremarkable. No biliary ductal dilation. Pancreas: No pancreatic ductal dilation or evidence of acute inflammation. Spleen: No splenomegaly. Adrenals/Urinary Tract: Bilateral adrenal glands appear normal. No hydronephrosis. Kidneys demonstrate symmetric enhancement and excretion of contrast material. Urinary bladder is unremarkable for degree of distension. Stomach/Bowel: Stomach is nondistended limiting evaluation. No pathologic dilation of small or large bowel. The appendix and terminal ileum appear normal. Moderate volume of formed stool in the colon. Left-sided colonic diverticulosis without findings of acute diverticulitis. Vascular/Lymphatic: Aortic atherosclerosis. Normal caliber abdominal aorta. Smooth IVC contours. No pathologically enlarged abdominal or pelvic lymph nodes. Reproductive: Prostate is unremarkable. Other: Prior bilateral inguinal and umbilical hernia repairs. No abdominal wall hernia identified. Musculoskeletal: Chronic bilateral L5 pars  defects without listhesis. Multilevel degenerative changes spine. IMPRESSION: 1. No acute abnormality identified within the abdomen or specifically, no abdominal wall hernia visualized. 2. Left-sided colonic diverticulosis without findings of acute diverticulitis. 3. Hepatomegaly with diffuse hepatic steatosis. 4.  Aortic Atherosclerosis (ICD10-I70.0). Electronically Signed   By: JDahlia BailiffM.D.   On: 08/10/2022 17:36   DG Chest 2 View  Result Date: 07/12/2022 CLINICAL DATA:  Shortness of breath and cough EXAM: CHEST - 2 VIEW COMPARISON:  04/23/2022 FINDINGS: Stable cardiomediastinal silhouette. Aortic atherosclerotic calcification. No focal consolidation, pleural effusion, or pneumothorax. No acute osseous abnormality. IMPRESSION: No active cardiopulmonary disease. Electronically Signed   By: TPlacido SouM.D.   On: 07/12/2022 00:42

## 2022-08-23 NOTE — Assessment & Plan Note (Addendum)
Labs are reviewed and discussed with patient. Hb has improved, Hct <52, hold off phlebotomy.  normal erythropoietin, slightly elevated carbo monoxide level JAK2 V617F mutation negative, with reflex to other mutations CALR, MPL, JAK 2 Ex 12-15 mutations negative. negative BCR-ABL1 FISH.   Awaiting sleep study results.  No phlebotomy today Patient will call our office if sleep study is negative and no explanation is found for his erythrocytosis. Marland Kitchen

## 2022-08-24 NOTE — Progress Notes (Signed)
See procedure note.

## 2022-08-24 NOTE — Procedures (Signed)
[facility]  HOME SLEEP TEST (Watch PAT) REPORT  STUDY DATE: 08/19/2022  DOB: 19-Aug-1970  MRN: [id number]  ORDERING CLINICIAN: Star Age, MD, PhD   REFERRING CLINICIAN: Jon Billings, NP   CLINICAL INFORMATION/HISTORY: 52 year old male with an underlying medical history of hypertension, hyperlipidemia, erythrocytosis, reflux disease, diverticulitis, migraine headaches, diabetes, and borderline obesity, who reports snoring and excessive daytime somnolence, as well as witnessed apneas.     Epworth sleepiness score: 8/24.  BMI: 30.1 kg/m  FINDINGS:   Sleep Summary:   Total Recording Time (hours, min): 8 hours, 37 min  Total Sleep Time (hours, min):  6 hours, 50 min  Percent REM (%):    17.8%   Respiratory Indices:   Calculated pAHI (per hour):  32.4/hour         REM pAHI:    45.5/hour       NREM pAHI: 30.1/hour  Central pAHI: 2.5/hour  Oxygen Saturation Statistics:    Oxygen Saturation (%) Mean: 94%   Minimum oxygen saturation (%):                 87%   O2 Saturation Range (%): 87 - 98%    O2 Saturation (minutes) <=88%: 0.5 min  Pulse Rate Statistics:   Pulse Mean (bpm):    86/min    Pulse Range (66 - 123/min)   IMPRESSION: OSA (obstructive sleep apnea)   RECOMMENDATION:  This home sleep test demonstrates severe obstructive sleep apnea -  by number of events - with a total AHI of 32.4/hour and O2 nadir of 87%.  Snoring was detected, mostly in the mild to moderate range, rarely louder.  Treatment with positive airway pressure is highly recommended. This will require - ideally - a full night CPAP titration study for proper treatment settings, O2 monitoring and mask fitting. For now, the patient will be advised to proceed with an autoPAP titration/trial at home. A laboratory attended titration study can be considered in the future for optimization of treatment settings and to improve tolerance and compliance. Alternative treatment options  are limited secondary to the severity of the patient's sleep disordered breathing, but may include surgical treatment with an implantable hypoglossal nerve stimulator (in carefully selected candidates, meeting criteria).  Concomitant weight loss is recommended, where clinically appropriate. Please note, that untreated obstructive sleep apnea may carry additional perioperative morbidity. Patients with significant obstructive sleep apnea should receive perioperative PAP therapy and the surgeons and particularly the anesthesiologist should be informed of the diagnosis and the severity of the sleep disordered breathing. The patient should be cautioned not to drive, work at heights, or operate dangerous or heavy equipment when tired or sleepy. Review and reiteration of good sleep hygiene measures should be pursued with any patient. Other causes of the patient's symptoms, including circadian rhythm disturbances, an underlying mood disorder, medication effect and/or an underlying medical problem cannot be ruled out based on this test. Clinical correlation is recommended.  The patient and his referring provider will be notified of the test results. The patient will be seen in follow up in sleep clinic at [facility].  I certify that I have reviewed the raw data recording prior to the issuance of this report in accordance with the standards of the American Academy of Sleep Medicine (AASM).    INTERPRETING PHYSICIAN:   Star Age, MD, PhD Medical Director, [facility]) [place], ABPN (Neurology and Sleep)   [facility] [address], [place], [place] [address] [phone]  [phone]                   

## 2022-08-25 ENCOUNTER — Telehealth: Payer: Self-pay

## 2022-08-25 NOTE — Telephone Encounter (Signed)
I called pt. I advised pt that Dr. Rexene Alberts reviewed their sleep study results and found that pt has severe osa. Dr. Rexene Alberts recommends that pt start an auto pap at home. I reviewed PAP compliance expectations with the pt. Pt is agreeable to starting an auto-PAP. I advised pt that an order will be sent to a DME, [facility], and [facility] will call the pt within about one week after they file with the pt's insurance. [facility] will show the pt how to use the machine, fit for masks, and troubleshoot the auto-PAP if needed. A follow up appt was made for insurance purposes with Amy, NP on 11/24/22 at 9:00am. Pt verbalized understanding to arrive 15 minutes early and bring their auto-PAP.  Pt verbalized understanding of results. Pt had no questions at this time but was encouraged to call back if questions arise. I have sent the order to [facility] and have received confirmation that they have received the order.

## 2022-08-25 NOTE — Telephone Encounter (Signed)
-----   Message from Star Age, MD sent at 08/24/2022  5:55 PM EST ----- Patient referred by PCP, seen by me on 06/29/22, patient had a HST on 08/19/2022.    Please call and notify the patient that the recent home sleep test showed obstructive sleep apnea in the severe range. I recommend treatment for this in the form of autoPAP, which means, that we don't have to bring him in for a sleep study with CPAP, but will let him start using a so called autoPAP machine at home, through a DME company (of his choice, or as per insurance requirement). The DME representative will fit the patient with a mask of choice, educate him on how to use the machine, how to put the mask on, etc. I have placed an order in the chart. Please send the order to a local DME, talk to patient, send report to referring MD. Please also reinforce the need for compliance with treatment. We will need a FU in sleep clinic for 10 weeks post-PAP set up, please arrange that with me or one of our NPs. Thanks,   Star Age, MD, PhD [facility])

## 2022-08-26 ENCOUNTER — Encounter: Payer: Self-pay | Admitting: Nurse Practitioner

## 2022-08-26 ENCOUNTER — Telehealth: Payer: Self-pay

## 2022-08-30 ENCOUNTER — Ambulatory Visit: Payer: 59 | Admitting: Anesthesiology

## 2022-08-30 ENCOUNTER — Ambulatory Visit
Admission: RE | Admit: 2022-08-30 | Discharge: 2022-08-30 | Disposition: A | Discharge status: Home / Self Care | Payer: 59 | Attending: Gastroenterology | Admitting: Gastroenterology

## 2022-08-30 ENCOUNTER — Encounter
Admission: RE | Disposition: A | Discharge status: Home / Self Care | Payer: Self-pay | Source: Home / Self Care | Attending: Gastroenterology

## 2022-08-30 DIAGNOSIS — Z79899 Other long term (current) drug therapy: Secondary | ICD-10-CM | POA: Diagnosis not present

## 2022-08-30 DIAGNOSIS — I10 Essential (primary) hypertension: Secondary | ICD-10-CM | POA: Insufficient documentation

## 2022-08-30 DIAGNOSIS — Z794 Long term (current) use of insulin: Secondary | ICD-10-CM | POA: Diagnosis not present

## 2022-08-30 DIAGNOSIS — Z860100 Personal history of colon polyps, unspecified: Secondary | ICD-10-CM

## 2022-08-30 DIAGNOSIS — E119 Type 2 diabetes mellitus without complications: Secondary | ICD-10-CM | POA: Insufficient documentation

## 2022-08-30 DIAGNOSIS — Z833 Family history of diabetes mellitus: Secondary | ICD-10-CM | POA: Diagnosis not present

## 2022-08-30 DIAGNOSIS — G4730 Sleep apnea, unspecified: Secondary | ICD-10-CM | POA: Diagnosis not present

## 2022-08-30 DIAGNOSIS — Z1211 Encounter for screening for malignant neoplasm of colon: Secondary | ICD-10-CM | POA: Insufficient documentation

## 2022-08-30 DIAGNOSIS — Z7984 Long term (current) use of oral hypoglycemic drugs: Secondary | ICD-10-CM | POA: Insufficient documentation

## 2022-08-30 DIAGNOSIS — K5730 Diverticulosis of large intestine without perforation or abscess without bleeding: Secondary | ICD-10-CM | POA: Insufficient documentation

## 2022-08-30 DIAGNOSIS — K219 Gastro-esophageal reflux disease without esophagitis: Secondary | ICD-10-CM | POA: Diagnosis not present

## 2022-08-30 DIAGNOSIS — Z87891 Personal history of nicotine dependence: Secondary | ICD-10-CM | POA: Diagnosis not present

## 2022-08-30 DIAGNOSIS — Z8719 Personal history of other diseases of the digestive system: Secondary | ICD-10-CM | POA: Diagnosis not present

## 2022-08-30 HISTORY — PX: COLONOSCOPY WITH PROPOFOL: SHX5780

## 2022-08-30 HISTORY — DX: Personal history of colon polyps, unspecified: Z86.0100

## 2022-08-30 LAB — GLUCOSE, CAPILLARY: Glucose-Capillary: 177 mg/dL — ABNORMAL HIGH (ref 70–99)

## 2022-08-30 SURGERY — COLONOSCOPY WITH PROPOFOL
Anesthesia: General

## 2022-08-30 MED ORDER — LIDOCAINE HCL (CARDIAC) PF 100 MG/5ML IV SOSY
PREFILLED_SYRINGE | INTRAVENOUS | Status: DC | PRN
  Administered 2022-08-30: 50 mg via INTRAVENOUS

## 2022-08-30 MED ORDER — SODIUM CHLORIDE 0.9 % IV SOLN
INTRAVENOUS | Status: DC
  Administered 2022-08-30: 20 mL/h via INTRAVENOUS

## 2022-08-30 MED ORDER — PROPOFOL 1000 MG/100ML IV EMUL
INTRAVENOUS | Status: AC
  Filled 2022-08-30: qty 100

## 2022-08-30 MED ORDER — PROPOFOL 500 MG/50ML IV EMUL
INTRAVENOUS | Status: DC | PRN
  Administered 2022-08-30: 175 ug/kg/min via INTRAVENOUS

## 2022-08-30 MED ORDER — LIDOCAINE HCL (PF) 2 % IJ SOLN
INTRAMUSCULAR | Status: AC
  Filled 2022-08-30: qty 5

## 2022-08-30 MED ORDER — GLYCOPYRROLATE 0.2 MG/ML IJ SOLN
INTRAMUSCULAR | Status: AC
  Filled 2022-08-30: qty 1

## 2022-08-30 MED ORDER — PROPOFOL 10 MG/ML IV BOLUS
INTRAVENOUS | Status: DC | PRN
  Administered 2022-08-30: 100 mg via INTRAVENOUS

## 2022-08-30 NOTE — H&P (Signed)
Keith Bellows, MD [address] [place], [place], [name], [address] [address], [place], [place], [name], [address] Phone: [phone]  Fax: [phone]  Primary Care Physician:  Jon Billings, NP   Pre-Procedure History & Physical: HPI:  Keith Barber is a 52 y.o. male is here for an colonoscopy.   Past Medical History:  Diagnosis Date   Bone spur    LEFT SHOULDER THAT MAKES HIS LEFT ARM TINGLE - had removed   Diverticulitis large intestine 09/2017   GERD (gastroesophageal reflux disease)    Hemorrhoids    Hyperlipidemia    Hypertension    Migraines    rare migraines    Past Surgical History:  Procedure Laterality Date   27 HOUR [place] STUDY N/A 05/30/2018   Procedure: 24 HOUR [place] STUDY;  Surgeon: Virgel Manifold, MD;  Location: [facility] ENDOSCOPY;  Service: Gastroenterology;  Laterality: N/A;   bicep tendon tear      COLONOSCOPY WITH PROPOFOL N/A 02/01/2018   Procedure: COLONOSCOPY WITH PROPOFOL;  Surgeon: Virgel Manifold, MD;  Location: [facility] ENDOSCOPY;  Service: Endoscopy;  Laterality: N/A;   ESOPHAGEAL MANOMETRY N/A 05/30/2018   Procedure: ESOPHAGEAL MANOMETRY (EM);  Surgeon: Virgel Manifold, MD;  Location: [facility] ENDOSCOPY;  Service: Gastroenterology;  Laterality: N/A;   ESOPHAGOGASTRODUODENOSCOPY (EGD) WITH PROPOFOL N/A 02/01/2018   Procedure: ESOPHAGOGASTRODUODENOSCOPY (EGD) WITH PROPOFOL;  Surgeon: Virgel Manifold, MD;  Location: [facility] ENDOS[facility];  Service: Endoscopy;  Laterality: N/A;   INGUINAL HERNIA REPAIR Bilateral 01/21/2017   Procedure: LAPAROSCOPIC BILATERAL INGUINAL HERNIA REPAIR;  Surgeon: Jules Husbands, MD;  Location: [facility] ORS;  Service: General;  Laterality: Bilateral;   KNEE SURGERY Left    SHOULDER ARTHROSCOPY WITH DISTAL CLAVICLE RESECTION Left 07/05/2017   Procedure: LEFT SHOULDER ARTHROSCOPY, SUBACROMIAL DECOMPRESSION, DISTAL CLAVICLE RESECTION, POSSIBLE MINI OPEN ROTATOR CUFF REPAIR;  Surgeon: [name], MD;   Location: [place];  Service: Orthopedics;  Laterality: Left;   SHOULDER CLOSED REDUCTION Left 12/13/2017   Procedure: LEFT SHOULDER MANIPULATION with steroid injection;  Surgeon: [name], MD;  Location: [place];  Service: Orthopedics;  Laterality: Left;   SHOULDER SURGERY Right    UMBILICAL HERNIA REPAIR N/A 01/21/2017   Procedure: HERNIA REPAIR UMBILICAL ADULT;  Surgeon: Jules Husbands, MD;  Location: [facility] ORS;  Service: General;  Laterality: N/A;    Prior to Admission medications   Medication Sig Start Date End Date Taking? Authorizing Provider  cyclobenzaprine (FLEXERIL) 5 MG tablet Take 1 tablet (5 mg total) by mouth 3 (three) times daily as needed for muscle spasms. 07/01/22  Yes Jon Billings, NP  loperamide (IMODIUM) 2 MG capsule Take 2-4 mg by mouth as needed for diarrhea or loose stools.   Yes [provider]  metFORMIN (GLUCOPHAGE) 500 MG tablet Take 1 tablet (500 mg total) by mouth 2 (two) times daily with a meal. 04/27/22  Yes Jon Billings, NP  naproxen sodium (ALEVE) 220 MG tablet Take 220 mg by mouth.   Yes [provider]  rizatriptan (MAXALT) 10 MG tablet Take 1 tablet by mouth twice daily as needed 02/10/22  Yes Jon Billings, NP  rosuvastatin (CRESTOR) 10 MG tablet Take 1 tablet by mouth once daily 03/01/22  Yes Jon Billings, NP  TRULICITY [id number] SOPN INJECT 0.75MG SUBCUTANEOUSLY ONCE A WEEK 08/06/22  Yes Jon Billings, NP  valsartan (DIOVAN) 40 MG tablet Take 1 tablet (40 mg total) by mouth daily. 04/27/22  Yes Jon Billings, NP  metoprolol succinate (TOPROL-XL) 25 MG 24  hr tablet Take 1 tablet (25 mg total) by mouth daily. Patient taking differently: Take 12.5 mg by mouth daily. 04/27/22   Jon Billings, NP    Allergies as of 08/12/2022 - Review Complete 08/11/2022  Allergen Reaction Noted   Amlodipine Itching 08/06/2020    Family History  Problem Relation Age of Onset   Diabetes Mother    Migraines Mother     Lung cancer Father    Lung cancer Paternal Grandfather    Cancer Neg Hx    COPD Neg Hx    Heart disease Neg Hx    Stroke Neg Hx    Sleep apnea Neg Hx     Social History   Socioeconomic History   Marital status: Divorced    Spouse name: Not on file   Number of children: Not on file   Years of education: Not on file   Highest education level: Not on file  Occupational History   Not on file  Tobacco Use   Smoking status: Former    Packs/day: 0.50    Years: 26.00    Total pack years: 13.00    Types: Cigarettes    Quit date: 01/13/2017    Years since quitting: 5.6   Smokeless tobacco: Never  Vaping Use   Vaping Use: Never used  Substance and Sexual Activity   Alcohol use: No   Drug use: No   Sexual activity: Yes  Other Topics Concern   Not on file  Social History Narrative   Not on file   Social Determinants of Health   Financial Resource Strain: Not on file  Food Insecurity: Not on file  Transportation Needs: Not on file  Physical Activity: Not on file  Stress: Not on file  Social Connections: Not on file  Intimate Partner Violence: Not on file    Review of Systems: See HPI, otherwise negative ROS  Physical Exam: BP (!) 134/102   Pulse 97   Temp (!) 96.9 F (36.1 C) (Temporal)   Resp 20   Ht 5' 9"$  (1.753 m)   Wt 91 kg   SpO2 95%   BMI 29.62 kg/m  General:   Alert,  pleasant and cooperative in NAD Head:  Normocephalic and atraumatic. Neck:  Supple; no masses or thyromegaly. Lungs:  Clear throughout to auscultation, normal respiratory effort.    Heart:  +S1, +S2, Regular rate and rhythm, No edema. Abdomen:  Soft, nontender and nondistended. Normal bowel sounds, without guarding, and without rebound.   Neurologic:  Alert and  oriented x4;  grossly normal neurologically.  Impression/Plan: [name] is here for an colonoscopy to be performed for surveillance due to prior history of colon polyps   Risks, benefits, limitations, and  alternatives regarding  colonoscopy have been reviewed with the patient.  Questions have been answered.  All parties agreeable.   Keith Bellows, MD  08/30/2022, 7:46 AM

## 2022-08-30 NOTE — Anesthesia Preprocedure Evaluation (Addendum)
Anesthesia Evaluation  Patient identified by MRN, date of birth, ID band Patient awake    Reviewed: Allergy & Precautions, H&P , NPO status   Airway Mallampati: II  TM Distance: >3 FB Neck ROM: full    Dental no notable dental hx.    Pulmonary sleep apnea , Patient abstained from smoking., former smoker   Pulmonary exam normal        Cardiovascular Exercise Tolerance: Good hypertension, Pt. on home beta blockers Normal cardiovascular exam     Neuro/Psych  Headaches  negative psych ROS   GI/Hepatic Neg liver ROS,GERD  Controlled,,  Endo/Other  diabetes, Well Controlled    Renal/GU negative Renal ROS  negative genitourinary   Musculoskeletal   Abdominal  (+) + obese  Peds  Hematology negative hematology ROS (+)   Anesthesia Other Findings Past Medical History: No date: Bone spur     Comment:  LEFT SHOULDER THAT MAKES HIS LEFT ARM TINGLE - had               removed 09/2017: Diverticulitis large intestine No date: GERD (gastroesophageal reflux disease) No date: Hemorrhoids No date: Hyperlipidemia No date: Hypertension No date: Migraines     Comment:  rare migraines  Past Surgical History: 05/30/2018: 24 HOUR [place] STUDY; N/A     Comment:  Procedure: 63 HOUR [place] STUDY;  Surgeon: Virgel Manifold, MD;  Location: [facility] END[facility];  Service:               Gastroenterology;  Laterality: N/A; No date: bicep tendon tear  02/01/2018: COLONOSCOPY WITH PROPOFOL; N/A     Comment:  Procedure: COLONOSCOPY WITH PROPOFOL;  Surgeon:               Virgel Manifold, MD;  Location: [facility];                Service: Endoscopy;  Laterality: N/A; 05/30/2018: ESOPHAGEAL MANOMETRY; N/A     Comment:  Procedure: ESOPHAGEAL MANOMETRY (EM);  Surgeon:               Virgel Manifold, MD;  Location: [facility];                Service: Gastroenterology;  Laterality: N/A; 02/01/2018:  ESOPHAGOGASTRODUODENOSCOPY (EGD) WITH PROPOFOL; N/A     Comment:  Procedure: ESOPHAGOGASTRODUODENOSCOPY (EGD) WITH               PROPOFOL;  Surgeon: Virgel Manifold, MD;  Location:               [facility];  Service: Endoscopy;  Laterality: N/A; 01/21/2017: INGUINAL HERNIA REPAIR; Bilateral     Comment:  Procedure: LAPAROSCOPIC BILATERAL INGUINAL HERNIA               REPAIR;  Surgeon: Jules Husbands, MD;  Location: [facility]               ORS;  Service: General;  Laterality: Bilateral; No date: KNEE SURGERY; Left 07/05/2017: SHOULDER ARTHROSCOPY WITH DISTAL CLAVICLE RESECTION; Left     Comment:  Procedure: LEFT SHOULDER ARTHROSCOPY, SUBACROMIAL               DECOMPRESSION, DISTAL CLAVICLE RESECTION, POSSIBLE MINI               OPEN ROTATOR CUFF REPAIR;  Surgeon: [name],  MD;  Location: [place];  Service: Orthopedics;  Laterality:              Left; 12/13/2017: SHOULDER CLOSED REDUCTION; Left     Comment:  Procedure: LEFT SHOULDER MANIPULATION with steroid               injection;  Surgeon: [name], MD;  Location:               [place];  Service: Orthopedics;  Laterality: Left; No date: SHOULDER SURGERY; Right [id number]: UMBILICAL HERNIA REPAIR; N/A     Comment:  Procedure: HERNIA REPAIR UMBILICAL ADULT;  Surgeon:               Jules Husbands, MD;  Location: [facility] ORS;  Service:               General;  Laterality: N/A;     Reproductive/Obstetrics negative OB ROS                             Anesthesia Physical Anesthesia Plan  ASA: 2  Anesthesia Plan: General   Post-op Pain Management: Minimal or no pain anticipated   Induction: Intravenous  PONV Risk Score and Plan: Propofol infusion and TIVA  Airway Management Planned: Natural Airway  Additional Equipment:   Intra-op Plan:   Post-operative Plan:   Informed Consent: I have reviewed the patients History and Physical, chart, labs and discussed the procedure  including the risks, benefits and alternatives for the proposed anesthesia with the patient or authorized representative who has indicated his/her understanding and acceptance.     Dental Advisory Given  Plan Discussed with: CRNA and Surgeon  Anesthesia Plan Comments:         Anesthesia Quick Evaluation

## 2022-08-30 NOTE — Anesthesia Postprocedure Evaluation (Signed)
Anesthesia Post Note  Patient: Keith Barber  Procedure(s) Performed: COLONOSCOPY WITH PROPOFOL  Patient location during evaluation: PACU Anesthesia Type: General Level of consciousness: awake and alert Pain management: pain level controlled Vital Signs Assessment: post-procedure vital signs reviewed and stable Respiratory status: spontaneous breathing, nonlabored ventilation and respiratory function stable Cardiovascular status: blood pressure returned to baseline and stable Postop Assessment: no apparent nausea or vomiting Anesthetic complications: no   No notable events documented.   Last Vitals:  Vitals:   08/30/22 0804 08/30/22 0814  BP: 106/71 117/86  Pulse: 94   Resp: 11   Temp: (!) 35.9 C   SpO2: 94%     Last Pain:  Vitals:   08/30/22 0824  TempSrc:   PainSc: 0-No pain                 [place]

## 2022-08-30 NOTE — Anesthesia Procedure Notes (Signed)
Date/Time: 08/30/2022 7:45 AM  Performed by: Johnna Acosta, CRNAPre-anesthesia Checklist: Patient identified, Emergency Drugs available, Suction available, Patient being monitored and Timeout performed Patient Re-evaluated:Patient Re-evaluated prior to induction Oxygen Delivery Method: Nasal cannula Preoxygenation: Pre-oxygenation with 100% oxygen Induction Type: IV induction

## 2022-08-30 NOTE — Transfer of Care (Signed)
Immediate Anesthesia Transfer of Care Note  Patient: Keith Barber  Procedure(s) Performed: COLONOSCOPY WITH PROPOFOL  Patient Location: Endoscopy Unit  Anesthesia Type:General  Level of Consciousness: sedated and drowsy  Airway & Oxygen Therapy: Patient Spontanous Breathing  Post-op Assessment: Report given to RN and Post -op Vital signs reviewed and stable  Post vital signs: Reviewed and stable  Last Vitals:  Vitals Value Taken Time  BP 106/71 08/30/22 0804  Temp    Pulse 94 08/30/22 0804  Resp 11 08/30/22 0804  SpO2 94 % 08/30/22 0804    Last Pain:  Vitals:   08/30/22 0705  TempSrc: Temporal  PainSc: 0-No pain         Complications: No notable events documented.

## 2022-08-30 NOTE — Op Note (Signed)
[facility] Gastroenterology Patient Name: Keith Barber Procedure Date: 08/30/2022 7:22 AM MRN: [id number] Account #: [id number] Date of Birth: 1970-12-17 Admit Type: Outpatient Age: 52 Room: [facility] ENDO ROOM 1 Gender: Male Note Status: Finalized Instrument Name: Jasper Riling [id number] Procedure:             Colonoscopy Indications:           Surveillance: Personal history of colonic polyps                         (unknown histology) on last colonoscopy more than 5                         years ago Providers:             Jonathon Bellows MD, MD Referring MD:          Jon Billings (Referring MD) Medicines:             Monitored Anesthesia Care Complications:         No immediate complications. Procedure:             Pre-Anesthesia Assessment:                        - Prior to the procedure, a History and Physical was                         performed, and patient medications, allergies and                         sensitivities were reviewed. The patient's tolerance                         of previous anesthesia was reviewed.                        - The risks and benefits of the procedure and the                         sedation options and risks were discussed with the                         patient. All questions were answered and informed                         consent was obtained.                        - ASA Grade Assessment: II - A patient with mild                         systemic disease.                        After obtaining informed consent, the colonoscope was                         passed under direct vision. Throughout the procedure,                         the patient's blood pressure, pulse,  and oxygen                         saturations were monitored continuously. The                         Colonoscope was introduced through the anus and                         advanced to the the cecum, identified by the                         appendiceal  orifice. The colonoscopy was performed                         with ease. The patient tolerated the procedure well.                         The quality of the bowel preparation was excellent.                         The ileocecal valve, appendiceal orifice, and rectum                         were photographed. Findings:      The perianal and digital rectal examinations were normal.      Multiple medium-mouthed diverticula were found in the sigmoid colon.      The exam was otherwise without abnormality on direct and retroflexion       views. Impression:            - Diverticulosis in the sigmoid colon.                        - The examination was otherwise normal on direct and                         retroflexion views.                        - No specimens collected. Recommendation:        - Discharge patient to home (with escort).                        - Resume previous diet.                        - Continue present medications.                        - Repeat colonoscopy in 10 years for screening                         purposes. Procedure Code(s):     --- Professional ---                        [phone], Colonoscopy, flexible; diagnostic, including                         collection of specimen(s) by brushing or washing, when  performed (separate procedure) Diagnosis Code(s):     --- Professional ---                        Z86.010, Personal history of colonic polyps                        K57.30, Diverticulosis of large intestine without                         perforation or abscess without bleeding CPT copyright 2022 American Medical Association. All rights reserved. The codes documented in this report are preliminary and upon coder review may  be revised to meet current compliance requirements. Jonathon Bellows, MD Jonathon Bellows MD, MD 08/30/2022 8:01:28 AM This report has been signed electronically. Number of Addenda: 0 Note Initiated On: 08/30/2022 7:22 AM Scope  Withdrawal Time: 0 hours 8 minutes 8 seconds  Total Procedure Duration: 0 hours 10 minutes 4 seconds  Estimated Blood Loss:  Estimated blood loss: none.      [facility]

## 2022-08-31 ENCOUNTER — Encounter: Payer: Self-pay | Admitting: Gastroenterology

## 2022-09-14 DIAGNOSIS — G4733 Obstructive sleep apnea (adult) (pediatric): Secondary | ICD-10-CM | POA: Diagnosis not present

## 2022-09-14 DIAGNOSIS — G4719 Other hypersomnia: Secondary | ICD-10-CM | POA: Diagnosis not present

## 2022-10-01 DIAGNOSIS — G4719 Other hypersomnia: Secondary | ICD-10-CM | POA: Diagnosis not present

## 2022-10-01 DIAGNOSIS — G4733 Obstructive sleep apnea (adult) (pediatric): Secondary | ICD-10-CM | POA: Diagnosis not present

## 2022-10-13 DIAGNOSIS — G4733 Obstructive sleep apnea (adult) (pediatric): Secondary | ICD-10-CM | POA: Diagnosis not present

## 2022-10-13 DIAGNOSIS — G4719 Other hypersomnia: Secondary | ICD-10-CM | POA: Diagnosis not present

## 2022-10-17 ENCOUNTER — Other Ambulatory Visit: Payer: Self-pay | Admitting: Nurse Practitioner

## 2022-10-18 NOTE — Telephone Encounter (Signed)
Requested Prescriptions  Pending Prescriptions Disp Refills   metoprolol succinate (TOPROL-XL) 25 MG 24 hr tablet [Pharmacy Med Name: Metoprolol Succinate ER 25 MG Oral Tablet Extended Release 24 Hour] 90 tablet 1    Sig: Take 1 tablet by mouth once daily     Cardiovascular:  Beta Blockers Passed - 10/17/2022  7:50 AM      Passed - Last BP in normal range    BP Readings from Last 1 Encounters:  08/30/22 117/86         Passed - Last Heart Rate in normal range    Pulse Readings from Last 1 Encounters:  08/30/22 94         Passed - Valid encounter within last 6 months    Recent Outpatient Visits           2 months ago Hypertension associated with diabetes [facility])   [place] Jon Billings, NP   5 months ago Hypertension associated with diabetes [facility])   [place] Jon Billings, NP   5 months ago Traumatic chest pain   [place], Erin E, PA-C   6 months ago Type 2 diabetes mellitus without complication, without long-term current use of insulin [facility])   [place], NP   8 months ago Hypertension associated with diabetes [facility])   [place] Jon Billings, NP       Future Appointments             In 2 months Jon Billings, NP [place], [facility]

## 2022-11-01 ENCOUNTER — Ambulatory Visit: Payer: 59 | Admitting: Family Medicine

## 2022-11-01 ENCOUNTER — Other Ambulatory Visit
Admission: RE | Admit: 2022-11-01 | Discharge: 2022-11-01 | Disposition: A | Discharge status: Home / Self Care | Payer: 59 | Source: Ambulatory Visit | Attending: Family Medicine | Admitting: Family Medicine

## 2022-11-01 ENCOUNTER — Encounter: Payer: Self-pay | Admitting: Oncology

## 2022-11-01 VITALS — BP 118/79 | HR 88 | Temp 98.0°F | Ht 69.0 in | Wt 193.3 lb

## 2022-11-01 DIAGNOSIS — R252 Cramp and spasm: Secondary | ICD-10-CM | POA: Diagnosis not present

## 2022-11-01 DIAGNOSIS — E1165 Type 2 diabetes mellitus with hyperglycemia: Secondary | ICD-10-CM | POA: Diagnosis not present

## 2022-11-01 DIAGNOSIS — R350 Frequency of micturition: Secondary | ICD-10-CM

## 2022-11-01 LAB — CBC WITH DIFFERENTIAL/PLATELET
Abs Immature Granulocytes: 0.02 10*3/uL (ref 0.00–0.07)
Basophils Absolute: 0 10*3/uL (ref 0.0–0.1)
Basophils Relative: 0 %
Eosinophils Absolute: 0.1 10*3/uL (ref 0.0–0.5)
Eosinophils Relative: 1 %
HCT: 50.8 % (ref 39.0–52.0)
Hemoglobin: 17.3 g/dL — ABNORMAL HIGH (ref 13.0–17.0)
Immature Granulocytes: 0 %
Lymphocytes Relative: 24 %
Lymphs Abs: 1.8 10*3/uL (ref 0.7–4.0)
MCH: 29.5 pg (ref 26.0–34.0)
MCHC: 34.1 g/dL (ref 30.0–36.0)
MCV: 86.7 fL (ref 80.0–100.0)
Monocytes Absolute: 0.5 10*3/uL (ref 0.1–1.0)
Monocytes Relative: 7 %
Neutro Abs: 5.1 10*3/uL (ref 1.7–7.7)
Neutrophils Relative %: 68 %
Platelets: 183 10*3/uL (ref 150–400)
RBC: 5.86 MIL/uL — ABNORMAL HIGH (ref 4.22–5.81)
RDW: 13 % (ref 11.5–15.5)
WBC: 7.5 10*3/uL (ref 4.0–10.5)
nRBC: 0 % (ref 0.0–0.2)

## 2022-11-01 LAB — COMPREHENSIVE METABOLIC PANEL
ALT: 30 U/L (ref 0–44)
AST: 29 U/L (ref 15–41)
Albumin: 4.2 g/dL (ref 3.5–5.0)
Alkaline Phosphatase: 100 U/L (ref 38–126)
Anion gap: 7 (ref 5–15)
BUN: 13 mg/dL (ref 6–20)
CO2: 27 mmol/L (ref 22–32)
Calcium: 8.4 mg/dL — ABNORMAL LOW (ref 8.9–10.3)
Chloride: 104 mmol/L (ref 98–111)
Creatinine, Ser: 0.86 mg/dL (ref 0.61–1.24)
GFR, Estimated: >60 mL/min (ref >60)
Glucose, Bld: 378 mg/dL — ABNORMAL HIGH (ref 70–99)
Potassium: 4.3 mmol/L (ref 3.5–5.1)
Sodium: 138 mmol/L (ref 135–145)
Total Bilirubin: 1.4 mg/dL — ABNORMAL HIGH (ref 0.3–1.2)
Total Protein: 6.9 g/dL (ref 6.5–8.1)

## 2022-11-01 LAB — URINALYSIS, ROUTINE W REFLEX MICROSCOPIC
Bilirubin, UA: NEGATIVE
Ketones, UA: NEGATIVE
Leukocytes,UA: NEGATIVE
Nitrite, UA: NEGATIVE
Protein,UA: NEGATIVE
RBC, UA: NEGATIVE
Specific Gravity, UA: <1.005 — ABNORMAL LOW (ref 1.005–1.030)
Urobilinogen, Ur: 0.2 mg/dL (ref 0.2–1.0)
pH, UA: 5.5 (ref 5.0–7.5)

## 2022-11-01 LAB — TSH: TSH: 1.127 u[IU]/mL (ref 0.350–4.500)

## 2022-11-01 LAB — VITAMIN D 25 HYDROXY (VIT D DEFICIENCY, FRACTURES): Vit D, 25-Hydroxy: 17.81 ng/mL — ABNORMAL LOW (ref 30–100)

## 2022-11-01 LAB — URIC ACID: Uric Acid, Serum: 3.4 mg/dL — ABNORMAL LOW (ref 3.7–8.6)

## 2022-11-01 LAB — BAYER DCA HB A1C WAIVED: HB A1C (BAYER DCA - WAIVED): 11 % — ABNORMAL HIGH (ref 4.8–5.6)

## 2022-11-01 MED ORDER — KETOROLAC TROMETHAMINE 60 MG/2ML IM SOLN
60.0000 mg | Freq: Once | INTRAMUSCULAR | Status: AC
  Administered 2022-11-01: 60 mg via INTRAMUSCULAR

## 2022-11-01 MED ORDER — METFORMIN HCL 500 MG PO TABS: 1000.0000 mg | ORAL_TABLET | Freq: Two times a day (BID) | ORAL | 1 refills | Status: DC

## 2022-11-01 NOTE — Progress Notes (Signed)
BP 118/79   Pulse 88   Temp 98 F (36.7 C) (Oral)   Ht 5\' 9"  (1.753 m)   Wt 193 lb 4.8 oz (87.7 kg)   SpO2 96%   BMI 28.55 kg/m    Subjective:    Patient ID: Keith Barber, male    DOB: 1971-04-20, 52 y.o.   MRN: [id number]  HPI: Keith Barber is a 52 y.o. male  Chief Complaint  Patient presents with   Leg Cramp    Patient says he is Diabetic and says he thinks the leg cramps are an underlying issue. Patient says he has noticed his increase of water intake has become significant. Patient says he is noticing more cramping in his R leg. Patient says he had an episode where it started in his R thigh and radiate down into his ankle and toes. Patient says this morning he has an episode in his R calf and radiate down into his ankle and foot. Patient says he has been trying over the counter cramping medication.    Urinary Frequency   LEG CRAMPS Duration: leg cramps Pain: yes Severity: severe  Quality:  cramping in his thigh Location:  thighs Bilateral:  no Onset: sudden Frequency: intermittent Time of  day:   day time Sudden unintentional leg jerking:   no Paresthesias:   no Decreased sensation:  no Weakness:   no Insomnia:   no Fatigue:   no Status: worse Treatments attempted: OTC medicine  DIABETES Hypoglycemic episodes:no Polydipsia/polyuria: yes Visual disturbance: no Chest pain: no Paresthesias: no Glucose Monitoring: no  Accucheck frequency: Not Checking Taking Insulin?: no Blood Pressure Monitoring: not checking Retinal Examination: Up to Date Foot Exam: Up to Date Diabetic Education: Completed Pneumovax: Up to Date Influenza: Up to Date Aspirin: no   Relevant past medical, surgical, family and social history reviewed and updated as indicated. Interim medical history since our last visit reviewed. Allergies and medications reviewed and updated.  Review of Systems  Constitutional: Negative.   Respiratory: Negative.    Cardiovascular:  Negative.   Gastrointestinal: Negative.   Genitourinary:  Positive for frequency and urgency. Negative for decreased urine volume, difficulty urinating, dysuria, enuresis, flank pain, genital sores, hematuria, penile discharge, penile pain, penile swelling, scrotal swelling and testicular pain.  Musculoskeletal:  Positive for myalgias. Negative for arthralgias, back pain, gait problem, joint swelling, neck pain and neck stiffness.  Skin: Negative.   Neurological: Negative.   Psychiatric/Behavioral: Negative.      Per HPI unless specifically indicated above     Objective:    BP 118/79   Pulse 88   Temp 98 F (36.7 C) (Oral)   Ht 5\' 9"  (1.753 m)   Wt 193 lb 4.8 oz (87.7 kg)   SpO2 96%   BMI 28.55 kg/m   Wt Readings from Last 3 Encounters:  11/01/22 193 lb 4.8 oz (87.7 kg)  08/30/22 200 lb 9.6 oz (91 kg)  08/23/22 204 lb 3.2 oz (92.6 kg)    Physical Exam Vitals and nursing note reviewed.  Constitutional:      General: He is not in acute distress.    Appearance: Normal appearance. He is not ill-appearing, toxic-appearing or diaphoretic.  HENT:     Head: Normocephalic and atraumatic.     Right Ear: External ear normal.     Left Ear: External ear normal.     Nose: Nose normal.     Mouth/Throat:     Mouth: Mucous membranes are moist.  Pharynx: Oropharynx is clear.  Eyes:     General: No scleral icterus.       Right eye: No discharge.        Left eye: No discharge.     Extraocular Movements: Extraocular movements intact.     Conjunctiva/sclera: Conjunctivae normal.     Pupils: Pupils are equal, round, and reactive to light.  Cardiovascular:     Rate and Rhythm: Normal rate and regular rhythm.     Pulses: Normal pulses.     Heart sounds: Normal heart sounds. No murmur heard.    No friction rub. No gallop.  Pulmonary:     Effort: Pulmonary effort is normal. No respiratory distress.     Breath sounds: Normal breath sounds. No stridor. No wheezing, rhonchi or rales.   Chest:     Chest wall: No tenderness.  Musculoskeletal:        General: Normal range of motion.     Cervical back: Normal range of motion and neck supple.  Skin:    General: Skin is warm and dry.     Capillary Refill: Capillary refill takes less than 2 seconds.     Coloration: Skin is not jaundiced or pale.     Findings: No bruising, erythema, lesion or rash.  Neurological:     General: No focal deficit present.     Mental Status: He is alert and oriented to person, place, and time. Mental status is at baseline.  Psychiatric:        Mood and Affect: Mood normal.        Behavior: Behavior normal.        Thought Content: Thought content normal.        Judgment: Judgment normal.     Results for orders placed or performed during the hospital encounter of 08/30/22  Glucose, capillary  Result Value Ref Range   Glucose-Capillary 177 (H) 70 - 99 mg/dL   Comment 1 IN EPIC       Assessment & Plan:   Problem List Items Addressed This Visit       Endocrine   Controlled diabetes mellitus type 2 with complications - Primary    Not under good control with A1c of 11.0. Will increase his metformin to  BID and continue trulicity. Follow up with PCP in 3-4 weeks. Call with any concerns.       Relevant Medications   metFORMIN (GLUCOPHAGE) 500 MG tablet   Other Visit Diagnoses     Leg cramps       Likey due to uncontrolled diabetes. Toradol shot today. Will get sugars under better control and recheck 3-4 weeks.   Relevant Medications   ketorolac (TORADOL) injection 60 mg (Start on 11/01/2022 11:30 AM)   Other Relevant Orders   Bayer DCA Hb A1c Waived   CBC with Differential/Platelet   Comprehensive metabolic panel   VITAMIN D 25 Hydroxy (Vit-D Deficiency, Fractures)   TSH   Uric acid   Urinary frequency       2+ glucose but otherwise normal. Will get sugars under better control and recheck 3-4 weeks.   Relevant Orders   Urinalysis, Routine w reflex microscopic         Follow up plan: Return in about 4 weeks (around 11/29/2022) for with PCP.

## 2022-11-01 NOTE — Assessment & Plan Note (Signed)
Not under good control with A1c of 11.0. Will increase his metformin to 1000mg  BID and continue trulicity. Follow up with PCP in 3-4 weeks. Call with any concerns.

## 2022-11-01 NOTE — Addendum Note (Signed)
Addended by: [name] on: 11/01/2022 11:34 AM   Modules accepted: Orders

## 2022-11-02 ENCOUNTER — Ambulatory Visit: Payer: 59 | Admitting: Nurse Practitioner

## 2022-11-02 ENCOUNTER — Other Ambulatory Visit: Payer: Self-pay | Admitting: Family Medicine

## 2022-11-02 DIAGNOSIS — E559 Vitamin D deficiency, unspecified: Secondary | ICD-10-CM | POA: Insufficient documentation

## 2022-11-02 HISTORY — DX: Vitamin D deficiency, unspecified: E55.9

## 2022-11-02 MED ORDER — VITAMIN D (ERGOCALCIFEROL) 1.25 MG (50000 UNIT) PO CAPS: 50000.0000 [IU] | ORAL_CAPSULE | ORAL | 1 refills | Status: AC

## 2022-11-13 DIAGNOSIS — G4719 Other hypersomnia: Secondary | ICD-10-CM | POA: Diagnosis not present

## 2022-11-13 DIAGNOSIS — G4733 Obstructive sleep apnea (adult) (pediatric): Secondary | ICD-10-CM | POA: Diagnosis not present

## 2022-11-18 ENCOUNTER — Ambulatory Visit: Payer: 59 | Admitting: Nurse Practitioner

## 2022-11-18 ENCOUNTER — Encounter: Payer: Self-pay | Admitting: Nurse Practitioner

## 2022-11-18 VITALS — BP 108/69 | HR 86 | Temp 98.1°F

## 2022-11-18 DIAGNOSIS — E1165 Type 2 diabetes mellitus with hyperglycemia: Secondary | ICD-10-CM

## 2022-11-18 DIAGNOSIS — M546 Pain in thoracic spine: Secondary | ICD-10-CM | POA: Diagnosis not present

## 2022-11-18 MED ORDER — ACCU-CHEK AVIVA PLUS VI STRP
1.0000 | ORAL_STRIP | Freq: Two times a day (BID) | 6 refills | Status: AC
Start: 2022-11-18 — End: ?

## 2022-11-18 MED ORDER — ACCU-CHEK SOFTCLIX LANCETS MISC
1.0000 | Freq: Two times a day (BID) | 6 refills | Status: AC
Start: 2022-11-18 — End: ?

## 2022-11-18 MED ORDER — ACCU-CHEK AVIVA PLUS W/DEVICE KIT
1.0000 | PACK | Freq: Two times a day (BID) | 0 refills | Status: AC
Start: 2022-11-18 — End: ?

## 2022-11-18 MED ORDER — TRULICITY 1.5 MG/0.5ML ~~LOC~~ SOAJ: 1.5000 mg | SUBCUTANEOUS | 0 refills | Status: DC

## 2022-11-18 MED ORDER — CYCLOBENZAPRINE HCL 5 MG PO TABS: 5.0000 mg | ORAL_TABLET | Freq: Every day | ORAL | 1 refills | Status: DC

## 2022-11-18 NOTE — Progress Notes (Signed)
BP 108/69   Pulse 86   Temp 98.1 F (36.7 C) (Oral)   SpO2 98%    Subjective:    Patient ID: Keith Barber, male    DOB: 1971/03/23, 52 y.o.   MRN: [id number]  HPI: Keith Barber is a 52 y.o. male  Chief Complaint  Patient presents with   Blurred Vision    Pt states he has been having issues with blurry vision. States he went to the eye doctor yesterday and was told that his metformin dose may be to high and causing issues with his vision.    Cramps    Pt states he has been having cramps in his calves for the past 10 days   Back Pain    Pt states he was sitting in his car and he states he slightly turned and felt something pull in his back this morning.    DIABETES Patient states he has been having some blurred vision.  He can remember having it for at least a week.  Last A1c was 11.2%.  over the last couple of weeks his blood pressure has been going down.  States his blood pressure has been <120/80.  He is able to go to the bathroom every couple of hours versus every 45 minutes and having to drink less water.   Hypoglycemic episodes: unsure Polydipsia/polyuria: yes Visual disturbance: yes Chest pain: no Paresthesias: no Glucose Monitoring: no  Accucheck frequency: Not Checking  Fasting glucose:  Post prandial:  Evening:  Before meals: Taking Insulin?: no  Long acting insulin:  Short acting insulin: Blood Pressure Monitoring: daily Retinal Examination: Up to Date Foot Exam: Up to Date Diabetic Education: Not Completed Pneumovax: Up to Date Influenza: Up to Date Aspirin: no  BACK PAIN Duration: days Mechanism of injury: no trauma Location:  middle right Onset: sudden Severity: severe Quality: pulling Frequency: constant Radiation: none Aggravating factors: movement Alleviating factors: nothing Status: stable Treatments attempted: none  Relief with NSAIDs?: no Nighttime pain:  no Paresthesias / decreased sensation:  no Bowel / bladder  incontinence:  no Fevers:  no Dysuria / urinary frequency:  no  Relevant past medical, surgical, family and social history reviewed and updated as indicated. Interim medical history since our last visit reviewed. Allergies and medications reviewed and updated.  Review of Systems  Eyes:  Negative for visual disturbance.  Respiratory:  Negative for chest tightness and shortness of breath.   Cardiovascular:  Negative for chest pain, palpitations and leg swelling.  Endocrine: Positive for polydipsia and polyuria.  Musculoskeletal:  Positive for back pain.  Neurological:  Negative for dizziness, light-headedness, numbness and headaches.    Per HPI unless specifically indicated above     Objective:    BP 108/69   Pulse 86   Temp 98.1 F (36.7 C) (Oral)   SpO2 98%   Wt Readings from Last 3 Encounters:  11/01/22 193 lb 4.8 oz (87.7 kg)  08/30/22 200 lb 9.6 oz (91 kg)  08/23/22 204 lb 3.2 oz (92.6 kg)    Physical Exam Vitals and nursing note reviewed.  Constitutional:      General: He is not in acute distress.    Appearance: Normal appearance. He is not ill-appearing, toxic-appearing or diaphoretic.  HENT:     Head: Normocephalic.     Right Ear: External ear normal.     Left Ear: External ear normal.     Nose: Nose normal. No congestion or rhinorrhea.     Mouth/Throat:  Mouth: Mucous membranes are moist.  Eyes:     General:        Right eye: No discharge.        Left eye: No discharge.     Extraocular Movements: Extraocular movements intact.     Conjunctiva/sclera: Conjunctivae normal.     Pupils: Pupils are equal, round, and reactive to light.  Cardiovascular:     Rate and Rhythm: Normal rate and regular rhythm.     Heart sounds: No murmur heard. Pulmonary:     Effort: Pulmonary effort is normal. No respiratory distress.     Breath sounds: Normal breath sounds. No wheezing, rhonchi or rales.  Abdominal:     General: Abdomen is flat. Bowel sounds are normal.   Musculoskeletal:     Cervical back: Normal range of motion and neck supple.  Skin:    General: Skin is warm and dry.     Capillary Refill: Capillary refill takes less than 2 seconds.  Neurological:     General: No focal deficit present.     Mental Status: He is alert and oriented to person, place, and time.  Psychiatric:        Mood and Affect: Mood normal.        Behavior: Behavior normal.        Thought Content: Thought content normal.        Judgment: Judgment normal.     Results for orders placed or performed during the hospital encounter of 11/01/22  Uric acid  Result Value Ref Range   Uric Acid, Serum 3.4 (L) 3.7 - 8.6 mg/dL  TSH  Result Value Ref Range   TSH 1.127 0.350 - 4.500 uIU/mL  VITAMIN D 25 Hydroxy (Vit-D Deficiency, Fractures)  Result Value Ref Range   Vit D, 25-Hydroxy 17.81 (L) 30 - 100 ng/mL  Comprehensive metabolic panel  Result Value Ref Range   Sodium 138 135 - 145 mmol/L   Potassium 4.3 3.5 - 5.1 mmol/L   Chloride 104 98 - 111 mmol/L   CO2 27 22 - 32 mmol/L   Glucose, Bld 378 (H) 70 - 99 mg/dL   BUN 13 6 - 20 mg/dL   Creatinine, Ser 6.21 0.61 - 1.24 mg/dL   Calcium 8.4 (L) 8.9 - 10.3 mg/dL   Total Protein 6.9 6.5 - 8.1 g/dL   Albumin 4.2 3.5 - 5.0 g/dL   AST 29 15 - 41 U/L   ALT 30 0 - 44 U/L   Alkaline Phosphatase 100 38 - 126 U/L   Total Bilirubin 1.4 (H) 0.3 - 1.2 mg/dL   GFR, Estimated >30 >86 mL/min   Anion gap 7 5 - 15  CBC with Differential/Platelet  Result Value Ref Range   WBC 7.5 4.0 - 10.5 K/uL   RBC 5.86 (H) 4.22 - 5.81 MIL/uL   Hemoglobin 17.3 (H) 13.0 - 17.0 g/dL   HCT 57.8 46.9 - 62.9 %   MCV 86.7 80.0 - 100.0 fL   MCH 29.5 26.0 - 34.0 pg   MCHC 34.1 30.0 - 36.0 g/dL   RDW 52.8 41.3 - 24.4 %   Platelets 183 150 - 400 K/uL   nRBC 0.0 0.0 - 0.2 %   Neutrophils Relative % 68 %   Neutro Abs 5.1 1.7 - 7.7 K/uL   Lymphocytes Relative 24 %   Lymphs Abs 1.8 0.7 - 4.0 K/uL   Monocytes Relative 7 %   Monocytes Absolute 0.5 0.1  - 1.0 K/uL   Eosinophils Relative  1 %   Eosinophils Absolute 0.1 0.0 - 0.5 K/uL   Basophils Relative 0 %   Basophils Absolute 0.0 0.0 - 0.1 K/uL   Immature Granulocytes 0 %   Abs Immature Granulocytes 0.02 0.00 - 0.07 K/uL      Assessment & Plan:   Problem List Items Addressed This Visit       Endocrine   Uncontrolled type 2 diabetes mellitus with hyperglycemia (HCC) - Primary    Chronic.  Not well controlled.  Vision issues worsens when he puts his glasses on.  Not consistent with vision concerns being due to elevated sugars.  Will increase Trulicity dose to 1.5mg  weekly.  Educated patient on checking sugars.  Diabetic testing supplies sent in for patient.  Encouraged patient to continue to make diet changes.  He has already changed to diet sodas.  Keep appt for 2 weeks.       Relevant Medications   Dulaglutide (TRULICITY) 1.5 MG/0.5ML SOPN   Other Visit Diagnoses     Acute thoracic back pain, unspecified back pain laterality       Will treat with flexeril.  Recommend resting.  Follow up if symptoms not improved.   Relevant Medications   cyclobenzaprine (FLEXERIL) 5 MG tablet        Follow up plan: Return if symptoms worsen or fail to improve.

## 2022-11-18 NOTE — Assessment & Plan Note (Signed)
Chronic.  Not well controlled.  Vision issues worsens when he puts his glasses on.  Not consistent with vision concerns being due to elevated sugars.  Will increase Trulicity dose to 1.5mg  weekly.  Educated patient on checking sugars.  Diabetic testing supplies sent in for patient.  Encouraged patient to continue to make diet changes.  He has already changed to diet sodas.  Keep appt for 2 weeks.

## 2022-11-22 NOTE — Progress Notes (Unsigned)
PATIENT: Ridha Delligatti DOB: 1971/05/28  REASON FOR VISIT: follow up HISTORY FROM: patient  No chief complaint on file.    HISTORY OF PRESENT ILLNESS:  11/22/22 ALL:  Aldous Landsman is a 52 y.o. male here today for follow up for OSA on CPAP. He was seen in consult with Dr Frances Furbish 06/2022 for concerns of snoring, daytime somnolence and witnessed apneas. HST 08/2022 showed severe obstructive sleep apnea - by number of events - with a total AHI of 32.4/hour and O2 nadir of 87%. AutoPAP was advised.     HISTORY: (copied from Dr Teofilo Pod previous note)  Dear Clydie Braun,   I saw your patient, Reyaansh Ro, upon your kind request in my sleep clinic today for initial consultation of his sleep disorder, in particular, concern for underlying obstructive sleep apnea.  The patient is unaccompanied today.  As you know, Mr. Bermudes is a 52 year old male with an underlying medical history of hypertension, hyperlipidemia, erythrocytosis, reflux disease, diverticulitis, migraine headaches, diabetes, and borderline obesity, who reports snoring and excessive daytime somnolence, as well as witnessed apneas.  His Epworth sleepiness score is 8 out of 24, fatigue severity score is 42 out of 63.  He reports having had COVID in 2021, he also had COVID in November 2023 and was treated with Paxlovid.  He reports nocturia about 2-3 times per average night, has had bruxism, has had some morning headaches and recurrent headaches which started in the neck.  He has been treated with Flexeril which helped his neck pain and tension headaches.  Bedtime is generally between 11:30 PM and 12:30 AM and rise time between 9 and 11.  He works as an Air traffic controller.  He has 2 grown children.  He quit smoking in 2018.  He currently does not drink any alcohol, he drinks caffeine in the form of soda, about 1 or 2 cans/day.  I reviewed your office note from 04/27/2022. He has seen oncology for erythrocytosis, recent visit on  05/20/2022.  I reviewed the office visit note, he was encouraged to seek evaluation for sleep apnea.  He has discomfort in his left shoulder, he has had several shoulder surgeries on the left.  He has pain in the left elbow and paresthesias in the left arm.   REVIEW OF SYSTEMS: Out of a complete 14 system review of symptoms, the patient complains only of the following symptoms, and all other reviewed systems are negative.  ESS:  ALLERGIES: Allergies  Allergen Reactions   Amlodipine Itching    HOME MEDICATIONS: Outpatient Medications Prior to Visit  Medication Sig Dispense Refill   Accu-Chek Softclix Lancets lancets 1 each by Other route in the morning and at bedtime. Use as instructed 100 each 6   Blood Glucose Monitoring Suppl (ACCU-CHEK AVIVA PLUS) w/Device KIT 1 each by Does not apply route in the morning and at bedtime. 1 kit 0   cyclobenzaprine (FLEXERIL) 5 MG tablet Take 1 tablet (5 mg total) by mouth at bedtime. 30 tablet 1   Dulaglutide (TRULICITY) 1.5 MG/0.5ML SOPN Inject 1.5 mg into the skin once a week. 6 mL 0   glucose blood (ACCU-CHEK AVIVA PLUS) test strip 1 each by Other route in the morning and at bedtime. Use as instructed 100 each 6   metFORMIN (GLUCOPHAGE) 500 MG tablet Take 2 tablets (1,000 mg total) by mouth 2 (two) times daily with a meal. 360 tablet 1   metoprolol succinate (TOPROL-XL) 25 MG 24 hr tablet Take 1  tablet by mouth once daily 90 tablet 1   naproxen sodium (ALEVE) 220 MG tablet Take 220 mg by mouth.     rizatriptan (MAXALT) 10 MG tablet Take 1 tablet by mouth twice daily as needed 180 tablet 0   rosuvastatin (CRESTOR) 10 MG tablet Take 1 tablet by mouth once daily 90 tablet 3   valsartan (DIOVAN) 40 MG tablet Take 1 tablet (40 mg total) by mouth daily. 90 tablet 1   Vitamin D, Ergocalciferol, (DRISDOL) 1.25 MG (50000 UNIT) CAPS capsule Take 1 capsule (50,000 Units total) by mouth every 7 (seven) days. 12 capsule 1   No facility-administered medications  prior to visit.    PAST MEDICAL HISTORY: Past Medical History:  Diagnosis Date   Bone spur    LEFT SHOULDER THAT MAKES HIS LEFT ARM TINGLE - had removed   Diverticulitis large intestine 09/2017   GERD (gastroesophageal reflux disease)    Hemorrhoids    Hyperlipidemia    Hypertension    Migraines    rare migraines   Sleep apnea     PAST SURGICAL HISTORY: Past Surgical History:  Procedure Laterality Date   40 HOUR PH STUDY N/A 05/30/2018   Procedure: 24 HOUR PH STUDY;  Surgeon: Pasty Spillers, MD;  Location: [facility] END[facility];  Service: Gastroenterology;  Laterality: N/A;   bicep tendon tear      COLONOSCOPY WITH PROPOFOL N/A 02/01/2018   Procedure: COLONOSCOPY WITH PROPOFOL;  Surgeon: Pasty Spillers, MD;  Location: [facility] END[facility]COPY;  Service: Endoscopy;  Laterality: N/A;   COLONOSCOPY WITH PROPOFOL N/A 08/30/2022   Procedure: COLONOSCOPY WITH PROPOFOL;  Surgeon: Wyline Mood, MD;  Location: [facility];  Service: Gastroenterology;  Laterality: N/A;   ESOPHAGEAL MANOMETRY N/A 05/30/2018   Procedure: ESOPHAGEAL MANOMETRY (EM);  Surgeon: Pasty Spillers, MD;  Location: [facility] END[facility];  Service: Gastroenterology;  Laterality: N/A;   ESOPHAGOGASTRODUODENOSCOPY (EGD) WITH PROPOFOL N/A 02/01/2018   Procedure: ESOPHAGOGASTRODUODENOSCOPY (EGD) WITH PROPOFOL;  Surgeon: Pasty Spillers, MD;  Location: [facility] END[facility];  Service: Endoscopy;  Laterality: N/A;   INGUINAL HERNIA REPAIR Bilateral 01/21/2017   Procedure: LAPAROSCOPIC BILATERAL INGUINAL HERNIA REPAIR;  Surgeon: Leafy Ro, MD;  Location: [facility] ORS;  Service: General;  Laterality: Bilateral;   KNEE SURGERY Left    SHOULDER ARTHROSCOPY WITH DISTAL CLAVICLE RESECTION Left 07/05/2017   Procedure: LEFT SHOULDER ARTHROSCOPY, SUBACROMIAL DECOMPRESSION, DISTAL CLAVICLE RESECTION, POSSIBLE MINI OPEN ROTATOR CUFF REPAIR;  Surgeon: Valeria Batman, MD;  Location: [facility];  Service: Orthopedics;  Laterality: Left;    SHOULDER CLOSED REDUCTION Left 12/13/2017   Procedure: LEFT SHOULDER MANIPULATION with steroid injection;  Surgeon: Valeria Batman, MD;  Location: [facility];  Service: Orthopedics;  Laterality: Left;   SHOULDER SURGERY Right    UMBILICAL HERNIA REPAIR N/A 01/21/2017   Procedure: HERNIA REPAIR UMBILICAL ADULT;  Surgeon: Leafy Ro, MD;  Location: [facility] ORS;  Service: General;  Laterality: N/A;    FAMILY HISTORY: Family History  Problem Relation Age of Onset   Diabetes Mother    Migraines Mother    Lung cancer Father    Lung cancer Paternal Grandfather    Cancer Neg Hx    COPD Neg Hx    Heart disease Neg Hx    Stroke Neg Hx    Sleep apnea Neg Hx     SOCIAL HISTORY: Social History   Socioeconomic History   Marital status: Divorced    Spouse name: Not on file   Number of children: Not on file  Years of education: Not on file   Highest education level: 12th grade  Occupational History   Not on file  Tobacco Use   Smoking status: Former    Packs/day: 0.50    Years: 26.00    Additional pack years: 0.00    Total pack years: 13.00    Types: Cigarettes    Quit date: 01/13/2017    Years since quitting: 5.8   Smokeless tobacco: Never  Vaping Use   Vaping Use: Never used  Substance and Sexual Activity   Alcohol use: No   Drug use: No   Sexual activity: Yes  Other Topics Concern   Not on file  Social History Narrative   Not on file   Social Determinants of Health   Financial Resource Strain: Low Risk  (11/01/2022)   Overall Financial Resource Strain (CARDIA)    Difficulty of Paying Living Expenses: Not hard at all  Food Insecurity: No Food Insecurity (11/01/2022)   Hunger Vital Sign    Worried About Running Out of Food in the Last Year: Never true    Ran Out of Food in the Last Year: Never true  Transportation Needs: No Transportation Needs (11/01/2022)   PRAPARE - Administrator, Civil Service (Medical): No    Lack of Transportation (Non-Medical): No   Physical Activity: Insufficiently Active (11/01/2022)   Exercise Vital Sign    Days of Exercise per Week: 3 days    Minutes of Exercise per Session: 30 min  Stress: No Stress Concern Present (11/01/2022)   [employer] of Occupational Health - Occupational Stress Questionnaire    Feeling of Stress : Only a little  Social Connections: Socially Isolated (11/01/2022)   Social Connection and Isolation Panel [NHANES]    Frequency of Communication with Friends and Family: More than three times a week    Frequency of Social Gatherings with Friends and Family: Once a week    Attends Religious Services: Never    Database administrator or Organizations: No    Attends Engineer, structural: Not on file    Marital Status: Divorced  Intimate Partner Violence: Not on file     PHYSICAL EXAM  There were no vitals filed for this visit. There is no height or weight on file to calculate BMI.  Generalized: Well developed, in no acute distress  Cardiology: normal rate and rhythm, no murmur noted Respiratory: clear to auscultation bilaterally  Neurological examination  Mentation: Alert oriented to time, place, history taking. Follows all commands speech and language fluent Cranial nerve II-XII: Pupils were equal round reactive to light. Extraocular movements were full, visual field were full  Motor: The motor testing reveals 5 over 5 strength of all 4 extremities. Good symmetric motor tone is noted throughout.  Gait and station: Gait is normal.    DIAGNOSTIC DATA (LABS, IMAGING, TESTING) - I reviewed patient records, labs, notes, testing and imaging myself where available.      No data to display           Lab Results  Component Value Date   WBC 7.5 11/01/2022   HGB 17.3 (H) 11/01/2022   HCT 50.8 11/01/2022   MCV 86.7 11/01/2022   PLT 183 11/01/2022      Component Value Date/Time   NA 138 11/01/2022 1211   NA 140 08/30/2018 1441   K 4.3 11/01/2022 1211   CL 104  11/01/2022 1211   CO2 27 11/01/2022 1211   GLUCOSE 378 (H) 11/01/2022  1211   BUN 13 11/01/2022 1211   BUN 12 08/30/2018 1441   CREATININE 0.86 11/01/2022 1211   CALCIUM 8.4 (L) 11/01/2022 1211   PROT 6.9 11/01/2022 1211   PROT 7.6 10/24/2019 0951   ALBUMIN 4.2 11/01/2022 1211   ALBUMIN 4.7 10/24/2019 0951   AST 29 11/01/2022 1211   ALT 30 11/01/2022 1211   ALKPHOS 100 11/01/2022 1211   BILITOT 1.4 (H) 11/01/2022 1211   BILITOT 0.7 10/24/2019 0951   GFRNONAA >60 11/01/2022 1211   GFRAA >60 02/05/2020 1104   Lab Results  Component Value Date   CHOL 138 07/28/2022   HDL 42 07/28/2022   LDLCALC 74 07/28/2022   TRIG 111 07/28/2022   CHOLHDL 3.3 07/28/2022   Lab Results  Component Value Date   HGBA1C 11.0 (H) 11/01/2022   No results found for: "VITAMINB12" Lab Results  Component Value Date   TSH 1.127 11/01/2022     ASSESSMENT AND PLAN 52 y.o. year old male  has a past medical history of Bone spur, Diverticulitis large intestine (09/2017), GERD (gastroesophageal reflux disease), Hemorrhoids, Hyperlipidemia, Hypertension, Migraines, and Sleep apnea. here with   No diagnosis found.    Kailand Hartfield is doing well on CPAP therapy. Compliance report reveals ***. *** was encouraged to continue using CPAP nightly and for greater than 4 hours each night. We will update supply orders as indicated. Risks of untreated sleep apnea review and education materials provided. Healthy lifestyle habits encouraged. *** will follow up in ***, sooner if needed. *** verbalizes understanding and agreement with this plan.    No orders of the defined types were placed in this encounter.    No orders of the defined types were placed in this encounter.     Shawnie Dapper, FNP-C 11/22/2022, 1:57 PM [facility] [address] [place], [place] [address] [phone]

## 2022-11-22 NOTE — Patient Instructions (Incomplete)

## 2022-11-24 ENCOUNTER — Other Ambulatory Visit: Payer: Self-pay | Admitting: Nurse Practitioner

## 2022-11-24 ENCOUNTER — Encounter: Payer: Self-pay | Admitting: Family Medicine

## 2022-11-24 ENCOUNTER — Ambulatory Visit: Payer: 59 | Admitting: Family Medicine

## 2022-11-24 VITALS — BP 122/75 | HR 93 | Ht 68.0 in | Wt 192.0 lb

## 2022-11-24 DIAGNOSIS — G4733 Obstructive sleep apnea (adult) (pediatric): Secondary | ICD-10-CM | POA: Diagnosis not present

## 2022-11-24 NOTE — Telephone Encounter (Signed)
Unable to refill per protocol, Rx expired. Discontinued 11/18/22, dose change.  Requested Prescriptions  Pending Prescriptions Disp Refills   TRULICITY 0.75 MG/0.5ML SOPN [Pharmacy Med Name: Trulicity 0.75 MG/0.5ML Subcutaneous Solution Pen-injector] 16 mL 0    Sig: INJECT 0.75MG  SUBCUTANEOUSLY ONCE A WEEK     Endocrinology:  Diabetes - GLP-1 Receptor Agonists Failed - 11/24/2022  6:50 AM      Failed - HBA1C is between 0 and 7.9 and within 180 days    HB A1C (BAYER DCA - WAIVED)  Date Value Ref Range Status  11/01/2022 11.0 (H) 4.8 - 5.6 % Final    Comment:             Prediabetes: 5.7 - 6.4          Diabetes: >6.4          Glycemic control for adults with diabetes: <7.0          Passed - Valid encounter within last 6 months    Recent Outpatient Visits           6 days ago Uncontrolled type 2 diabetes mellitus with hyperglycemia [facility])   [place] [facility] Larae Grooms, NP   3 weeks ago Uncontrolled type 2 diabetes mellitus with hyperglycemia [facility])   [place] [facility] [place], Megan P, DO   3 months ago Hypertension associated with diabetes [facility])   [place] [facility] Larae Grooms, NP   7 months ago Hypertension associated with diabetes [facility])   [place] [facility] Larae Grooms, NP   7 months ago Traumatic chest pain   [place] [facility] Mecum, Oswaldo Conroy, PA-C       Future Appointments             In 1 week Larae Grooms, NP [place] [facility]   In 1 month Larae Grooms, NP [place] [facility], [facility]

## 2022-12-01 ENCOUNTER — Ambulatory Visit: Payer: 59 | Admitting: Nurse Practitioner

## 2022-12-01 ENCOUNTER — Encounter: Payer: Self-pay | Admitting: Nurse Practitioner

## 2022-12-01 ENCOUNTER — Telehealth: Payer: Self-pay

## 2022-12-01 VITALS — BP 124/83 | HR 86 | Temp 98.5°F | Wt 191.5 lb

## 2022-12-01 DIAGNOSIS — E1165 Type 2 diabetes mellitus with hyperglycemia: Secondary | ICD-10-CM

## 2022-12-01 NOTE — Progress Notes (Unsigned)
BP 124/83   Pulse 86   Temp 98.5 F (36.9 C) (Oral)   Wt 191 lb 8 oz (86.9 kg)   SpO2 97%   BMI 29.12 kg/m    Subjective:    Patient ID: Keith Barber, male    DOB: 06-Oct-1970, 52 y.o.   MRN: [id number]  HPI: Keith Barber is a 52 y.o. male  Chief Complaint  Patient presents with   Diabetes    Pt states he feels that his metformin is affecting his vision   DIABETES Patient states he has been having some blurred vision.  His vision worsens when he puts his glasses on.  Both eye doctors that he has seen told him he is taking too much Metformin.  As the day goes on his vision gets worse.   Hypoglycemic episodes: unsure Polydipsia/polyuria: yes Visual disturbance: yes Chest pain: no Paresthesias: no Glucose Monitoring: yes  Accucheck frequency: daily  Fasting glucose: 181-300  Post prandial:  Evening:  Before meals: Taking Insulin?: no  Long acting insulin:  Short acting insulin: Blood Pressure Monitoring: daily Retinal Examination: Up to Date Foot Exam: Up to Date Diabetic Education: Not Completed Pneumovax: Up to Date Influenza: Up to Date Aspirin: no   Relevant past medical, surgical, family and social history reviewed and updated as indicated. Interim medical history since our last visit reviewed. Allergies and medications reviewed and updated.  Review of Systems  Eyes:  Positive for visual disturbance.  Respiratory:  Negative for chest tightness and shortness of breath.   Cardiovascular:  Negative for chest pain, palpitations and leg swelling.  Endocrine: Negative for polydipsia and polyuria.  Musculoskeletal:  Positive for back pain.  Neurological:  Negative for dizziness, light-headedness, numbness and headaches.    Per HPI unless specifically indicated above     Objective:    BP 124/83   Pulse 86   Temp 98.5 F (36.9 C) (Oral)   Wt 191 lb 8 oz (86.9 kg)   SpO2 97%   BMI 29.12 kg/m   Wt Readings from Last 3 Encounters:   12/01/22 191 lb 8 oz (86.9 kg)  11/24/22 192 lb (87.1 kg)  11/01/22 193 lb 4.8 oz (87.7 kg)    Physical Exam Vitals and nursing note reviewed.  Constitutional:      General: He is not in acute distress.    Appearance: Normal appearance. He is not ill-appearing, toxic-appearing or diaphoretic.  HENT:     Head: Normocephalic.     Right Ear: External ear normal.     Left Ear: External ear normal.     Nose: Nose normal. No congestion or rhinorrhea.     Mouth/Throat:     Mouth: Mucous membranes are moist.  Eyes:     General:        Right eye: No discharge.        Left eye: No discharge.     Extraocular Movements: Extraocular movements intact.     Conjunctiva/sclera: Conjunctivae normal.     Pupils: Pupils are equal, round, and reactive to light.  Cardiovascular:     Rate and Rhythm: Normal rate and regular rhythm.     Heart sounds: No murmur heard. Pulmonary:     Effort: Pulmonary effort is normal. No respiratory distress.     Breath sounds: Normal breath sounds. No wheezing, rhonchi or rales.  Abdominal:     General: Abdomen is flat. Bowel sounds are normal.  Musculoskeletal:     Cervical back: Normal range of  motion and neck supple.  Skin:    General: Skin is warm and dry.     Capillary Refill: Capillary refill takes less than 2 seconds.  Neurological:     General: No focal deficit present.     Mental Status: He is alert and oriented to person, place, and time.  Psychiatric:        Mood and Affect: Mood normal.        Behavior: Behavior normal.        Thought Content: Thought content normal.        Judgment: Judgment normal.     Results for orders placed or performed during the hospital encounter of 11/01/22  Uric acid  Result Value Ref Range   Uric Acid, Serum 3.4 (L) 3.7 - 8.6 mg/dL  TSH  Result Value Ref Range   TSH 1.127 0.350 - 4.500 uIU/mL  VITAMIN D 25 Hydroxy (Vit-D Deficiency, Fractures)  Result Value Ref Range   Vit D, 25-Hydroxy 17.81 (L) 30 - 100  ng/mL  Comprehensive metabolic panel  Result Value Ref Range   Sodium 138 135 - 145 mmol/L   Potassium 4.3 3.5 - 5.1 mmol/L   Chloride 104 98 - 111 mmol/L   CO2 27 22 - 32 mmol/L   Glucose, Bld 378 (H) 70 - 99 mg/dL   BUN 13 6 - 20 mg/dL   Creatinine, Ser 7.82 0.61 - 1.24 mg/dL   Calcium 8.4 (L) 8.9 - 10.3 mg/dL   Total Protein 6.9 6.5 - 8.1 g/dL   Albumin 4.2 3.5 - 5.0 g/dL   AST 29 15 - 41 U/L   ALT 30 0 - 44 U/L   Alkaline Phosphatase 100 38 - 126 U/L   Total Bilirubin 1.4 (H) 0.3 - 1.2 mg/dL   GFR, Estimated >95 >62 mL/min   Anion gap 7 5 - 15  CBC with Differential/Platelet  Result Value Ref Range   WBC 7.5 4.0 - 10.5 K/uL   RBC 5.86 (H) 4.22 - 5.81 MIL/uL   Hemoglobin 17.3 (H) 13.0 - 17.0 g/dL   HCT 13.0 86.5 - 78.4 %   MCV 86.7 80.0 - 100.0 fL   MCH 29.5 26.0 - 34.0 pg   MCHC 34.1 30.0 - 36.0 g/dL   RDW 69.6 29.5 - 28.4 %   Platelets 183 150 - 400 K/uL   nRBC 0.0 0.0 - 0.2 %   Neutrophils Relative % 68 %   Neutro Abs 5.1 1.7 - 7.7 K/uL   Lymphocytes Relative 24 %   Lymphs Abs 1.8 0.7 - 4.0 K/uL   Monocytes Relative 7 %   Monocytes Absolute 0.5 0.1 - 1.0 K/uL   Eosinophils Relative 1 %   Eosinophils Absolute 0.1 0.0 - 0.5 K/uL   Basophils Relative 0 %   Basophils Absolute 0.0 0.0 - 0.1 K/uL   Immature Granulocytes 0 %   Abs Immature Granulocytes 0.02 0.00 - 0.07 K/uL      Assessment & Plan:   Problem List Items Addressed This Visit       Endocrine   Uncontrolled type 2 diabetes mellitus with hyperglycemia (HCC) - Primary     Follow up plan: No follow-ups on file.

## 2022-12-01 NOTE — Telephone Encounter (Signed)
PA for Trulicity initiated and submitted via Cover My Meds. Key: [id number]  Awaiting determination from the patient's insurance company.

## 2022-12-01 NOTE — Assessment & Plan Note (Addendum)
Chronic.  Not well controlled.  Would like to decrease his Metformin due to being told that is what is affecting his vision.  Although, I do not believe that is the cause of his blurred vision, Metformin was decreased from 1000mg  BID to 500mg  BID.  Lengthy discussion had with patient regarding carbohydrate intake.  Declined referral to Diabetes education.  He does not want to take [place] due to side effects.  If not improved at next visit, will need to start Insulin.  This was discussed with patient at visit today.  Follow up in 1 month.

## 2022-12-09 NOTE — Telephone Encounter (Signed)
Patient called to get an update on the PA for Trulicity. Patient states that he is currently out of the medication. Please advise.

## 2022-12-09 NOTE — Telephone Encounter (Signed)
Called and spoke with [facility] to find out what was going on with the patient's prior authorization. They state that the PA has been approved and that it was ran by Jerome today.   Called and notified patient of the above information. He states that Jordan Hawks has already contacted him to pick up his prescription.

## 2022-12-12 ENCOUNTER — Other Ambulatory Visit: Payer: Self-pay | Admitting: Nurse Practitioner

## 2022-12-13 DIAGNOSIS — G4733 Obstructive sleep apnea (adult) (pediatric): Secondary | ICD-10-CM | POA: Diagnosis not present

## 2022-12-13 DIAGNOSIS — G4719 Other hypersomnia: Secondary | ICD-10-CM | POA: Diagnosis not present

## 2022-12-14 NOTE — Telephone Encounter (Signed)
Requested Prescriptions  Pending Prescriptions Disp Refills   valsartan (DIOVAN) 40 MG tablet [Pharmacy Med Name: Valsartan 40 MG Oral Tablet] 90 tablet 0    Sig: Take 1 tablet by mouth once daily     Cardiovascular:  Angiotensin Receptor Blockers Passed - 12/12/2022  7:50 AM      Passed - Cr in normal range and within 180 days    Creatinine, Ser  Date Value Ref Range Status  11/01/2022 0.86 0.61 - 1.24 mg/dL Final         Passed - K in normal range and within 180 days    Potassium  Date Value Ref Range Status  11/01/2022 4.3 3.5 - 5.1 mmol/L Final         Passed - Patient is not pregnant      Passed - Last BP in normal range    BP Readings from Last 1 Encounters:  12/01/22 124/83         Passed - Valid encounter within last 6 months    Recent Outpatient Visits           1 week ago Uncontrolled type 2 diabetes mellitus with hyperglycemia [facility])   [place] [facility] [facility] Larae Grooms, NP   3 weeks ago Uncontrolled type 2 diabetes mellitus with hyperglycemia [facility])   [place] [facility] Larae Grooms, NP   1 month ago Uncontrolled type 2 diabetes mellitus with hyperglycemia [facility])   [place] [facility] [place], Megan P, DO   4 months ago Hypertension associated with diabetes [facility])   [place] [facility] Larae Grooms, NP   7 months ago Hypertension associated with diabetes [facility])   [place] [facility] Larae Grooms, NP       Future Appointments             In 1 week Larae Grooms, NP [place] [facility], [facility]

## 2022-12-27 ENCOUNTER — Ambulatory Visit: Payer: 59 | Admitting: Nurse Practitioner

## 2022-12-27 ENCOUNTER — Encounter: Payer: Self-pay | Admitting: Nurse Practitioner

## 2022-12-27 VITALS — BP 115/72 | HR 88 | Temp 97.9°F | Wt 191.6 lb

## 2022-12-27 DIAGNOSIS — E1165 Type 2 diabetes mellitus with hyperglycemia: Secondary | ICD-10-CM | POA: Diagnosis not present

## 2022-12-27 DIAGNOSIS — Z7984 Long term (current) use of oral hypoglycemic drugs: Secondary | ICD-10-CM | POA: Diagnosis not present

## 2022-12-27 MED ORDER — METFORMIN HCL 500 MG PO TABS: 500.0000 mg | ORAL_TABLET | Freq: Two times a day (BID) | ORAL | 1 refills | Status: DC

## 2022-12-27 MED ORDER — TRULICITY 3 MG/0.5ML ~~LOC~~ SOAJ: 3.0000 mg | SUBCUTANEOUS | 1 refills | Status: DC

## 2022-12-27 NOTE — Progress Notes (Signed)
BP 115/72   Pulse 88   Temp 97.9 F (36.6 C) (Oral)   Wt 191 lb 9.6 oz (86.9 kg)   SpO2 98%   BMI 29.13 kg/m    Subjective:    Patient ID: Keith Barber, male    DOB: 1970/08/29, 52 y.o.   MRN: [id number]  HPI: Keith Barber is a 52 y.o. male  Chief Complaint  Patient presents with   Diabetes   DIABETES Patient states his vision has been a lot better since decreasing the Metformin dose.  Trulicity 1.5mg  weekly  and tolerating it well.  He is also taking Metformin 500mg  BID. Hypoglycemic episodes: no Polydipsia/polyuria: yes Visual disturbance: no Chest pain: no Paresthesias: no Glucose Monitoring: yes  Accucheck frequency: daily  Fasting glucose: 130-150  Post prandial:  Evening:  Before meals: Taking Insulin?: no  Long acting insulin:  Short acting insulin: Blood Pressure Monitoring: daily Retinal Examination: Up to Date Foot Exam: Up to Date Diabetic Education: Not Completed Pneumovax: Up to Date Influenza: Up to Date Aspirin: no   Relevant past medical, surgical, family and social history reviewed and updated as indicated. Interim medical history since our last visit reviewed. Allergies and medications reviewed and updated.  Review of Systems  Eyes:  Negative for visual disturbance.  Respiratory:  Negative for chest tightness and shortness of breath.   Cardiovascular:  Negative for chest pain, palpitations and leg swelling.  Endocrine: Negative for polydipsia and polyuria.  Musculoskeletal:  Positive for back pain.  Neurological:  Negative for dizziness, light-headedness, numbness and headaches.    Per HPI unless specifically indicated above     Objective:    BP 115/72   Pulse 88   Temp 97.9 F (36.6 C) (Oral)   Wt 191 lb 9.6 oz (86.9 kg)   SpO2 98%   BMI 29.13 kg/m   Wt Readings from Last 3 Encounters:  12/27/22 191 lb 9.6 oz (86.9 kg)  12/01/22 191 lb 8 oz (86.9 kg)  11/24/22 192 lb (87.1 kg)    Physical Exam Vitals and  nursing note reviewed.  Constitutional:      General: He is not in acute distress.    Appearance: Normal appearance. He is not ill-appearing, toxic-appearing or diaphoretic.  HENT:     Head: Normocephalic.     Right Ear: External ear normal.     Left Ear: External ear normal.     Nose: Nose normal. No congestion or rhinorrhea.     Mouth/Throat:     Mouth: Mucous membranes are moist.  Eyes:     General:        Right eye: No discharge.        Left eye: No discharge.     Extraocular Movements: Extraocular movements intact.     Conjunctiva/sclera: Conjunctivae normal.     Pupils: Pupils are equal, round, and reactive to light.  Cardiovascular:     Rate and Rhythm: Normal rate and regular rhythm.     Heart sounds: No murmur heard. Pulmonary:     Effort: Pulmonary effort is normal. No respiratory distress.     Breath sounds: Normal breath sounds. No wheezing, rhonchi or rales.  Abdominal:     General: Abdomen is flat. Bowel sounds are normal.  Musculoskeletal:     Cervical back: Normal range of motion and neck supple.  Skin:    General: Skin is warm and dry.     Capillary Refill: Capillary refill takes less than 2 seconds.  Neurological:  General: No focal deficit present.     Mental Status: He is alert and oriented to person, place, and time.  Psychiatric:        Mood and Affect: Mood normal.        Behavior: Behavior normal.        Thought Content: Thought content normal.        Judgment: Judgment normal.     Results for orders placed or performed during the hospital encounter of 11/01/22  Uric acid  Result Value Ref Range   Uric Acid, Serum 3.4 (L) 3.7 - 8.6 mg/dL  TSH  Result Value Ref Range   TSH 1.127 0.350 - 4.500 uIU/mL  VITAMIN D 25 Hydroxy (Vit-D Deficiency, Fractures)  Result Value Ref Range   Vit D, 25-Hydroxy 17.81 (L) 30 - 100 ng/mL  Comprehensive metabolic panel  Result Value Ref Range   Sodium 138 135 - 145 mmol/L   Potassium 4.3 3.5 - 5.1 mmol/L    Chloride 104 98 - 111 mmol/L   CO2 27 22 - 32 mmol/L   Glucose, Bld 378 (H) 70 - 99 mg/dL   BUN 13 6 - 20 mg/dL   Creatinine, Ser 1.61 0.61 - 1.24 mg/dL   Calcium 8.4 (L) 8.9 - 10.3 mg/dL   Total Protein 6.9 6.5 - 8.1 g/dL   Albumin 4.2 3.5 - 5.0 g/dL   AST 29 15 - 41 U/L   ALT 30 0 - 44 U/L   Alkaline Phosphatase 100 38 - 126 U/L   Total Bilirubin 1.4 (H) 0.3 - 1.2 mg/dL   GFR, Estimated >09 >60 mL/min   Anion gap 7 5 - 15  CBC with Differential/Platelet  Result Value Ref Range   WBC 7.5 4.0 - 10.5 K/uL   RBC 5.86 (H) 4.22 - 5.81 MIL/uL   Hemoglobin 17.3 (H) 13.0 - 17.0 g/dL   HCT 45.4 09.8 - 11.9 %   MCV 86.7 80.0 - 100.0 fL   MCH 29.5 26.0 - 34.0 pg   MCHC 34.1 30.0 - 36.0 g/dL   RDW 14.7 82.9 - 56.2 %   Platelets 183 150 - 400 K/uL   nRBC 0.0 0.0 - 0.2 %   Neutrophils Relative % 68 %   Neutro Abs 5.1 1.7 - 7.7 K/uL   Lymphocytes Relative 24 %   Lymphs Abs 1.8 0.7 - 4.0 K/uL   Monocytes Relative 7 %   Monocytes Absolute 0.5 0.1 - 1.0 K/uL   Eosinophils Relative 1 %   Eosinophils Absolute 0.1 0.0 - 0.5 K/uL   Basophils Relative 0 %   Basophils Absolute 0.0 0.0 - 0.1 K/uL   Immature Granulocytes 0 %   Abs Immature Granulocytes 0.02 0.00 - 0.07 K/uL      Assessment & Plan:   Problem List Items Addressed This Visit       Endocrine   Uncontrolled type 2 diabetes mellitus with hyperglycemia (HCC) - Primary    Chronic.  Not well controlled.  Vision has improved per patient since decreasing dose of Metformin from 1000mg  BID to 500mg  BID.  Sugars are running 130-150 fasting.  Will increase Trulicity to 3mg  weekly.  Discussed with patient about backorder of medication.  Will change medication from Trulicity to Ozempic if needed.  Patient understands and agrees with plan.  Follow up in 1 month.  Will check labs at next visit.  Call sooner if concerns arise.       Relevant Medications   Dulaglutide (TRULICITY) 3 MG/0.5ML  SOPN   metFORMIN (GLUCOPHAGE) 500 MG tablet      Follow up plan: Return in about 1 month (around 01/26/2023) for HTN, HLD, DM2 FU.

## 2022-12-27 NOTE — Assessment & Plan Note (Signed)
Chronic.  Not well controlled.  Vision has improved per patient since decreasing dose of Metformin from 1000mg  BID to 500mg  BID.  Sugars are running 130-150 fasting.  Will increase Trulicity to 3mg  weekly.  Discussed with patient about backorder of medication.  Will change medication from Trulicity to Ozempic if needed.  Patient understands and agrees with plan.  Follow up in 1 month.  Will check labs at next visit.  Call sooner if concerns arise.

## 2023-01-13 ENCOUNTER — Other Ambulatory Visit: Payer: Self-pay | Admitting: Nurse Practitioner

## 2023-01-13 DIAGNOSIS — G4733 Obstructive sleep apnea (adult) (pediatric): Secondary | ICD-10-CM | POA: Diagnosis not present

## 2023-01-13 DIAGNOSIS — G4719 Other hypersomnia: Secondary | ICD-10-CM | POA: Diagnosis not present

## 2023-01-17 NOTE — Telephone Encounter (Signed)
Requested medication (s) are due for refill today:yes  Requested medication (s) are on the active medication list:yes  Last refill:  11/18/22 #30 1 RF  Future visit scheduled: yes  Notes to clinic:  med not delegated to NT to RF   Requested Prescriptions  Pending Prescriptions Disp Refills   cyclobenzaprine (FLEXERIL) 5 MG tablet [Pharmacy Med Name: Cyclobenzaprine HCl 5 MG Oral Tablet] 30 tablet 0    Sig: TAKE 1 TABLET BY MOUTH AT BEDTIME     Not Delegated - Analgesics:  Muscle Relaxants Failed - 01/13/2023  9:34 PM      Failed - This refill cannot be delegated      Passed - Valid encounter within last 6 months    Recent Outpatient Visits           3 weeks ago Uncontrolled type 2 diabetes mellitus with hyperglycemia (HCC)   [place] [facility] Larae Grooms, NP   1 month ago Uncontrolled type 2 diabetes mellitus with hyperglycemia [facility])   [place] [facility] Larae Grooms, NP   2 months ago Uncontrolled type 2 diabetes mellitus with hyperglycemia [facility])   [place] [facility] Larae Grooms, NP   2 months ago Uncontrolled type 2 diabetes mellitus with hyperglycemia [facility])   [place] [facility] [place], Megan P, DO   5 months ago Hypertension associated with diabetes [facility])   [place] [facility] Larae Grooms, NP       Future Appointments             In 1 week Pearley, Sherran Needs, NP [place] [facility]

## 2023-01-20 ENCOUNTER — Other Ambulatory Visit: Payer: Self-pay | Admitting: Nurse Practitioner

## 2023-01-21 NOTE — Telephone Encounter (Signed)
Requested Prescriptions  Pending Prescriptions Disp Refills   rosuvastatin (CRESTOR) 10 MG tablet [Pharmacy Med Name: Rosuvastatin Calcium 10 MG Oral Tablet] 90 tablet 0    Sig: Take 1 tablet by mouth once daily     Cardiovascular:  Antilipid - Statins 2 Failed - 01/20/2023  6:51 AM      Failed - Lipid Panel in normal range within the last 12 months    Cholesterol, Total  Date Value Ref Range Status  02/23/2018 154 100 - 199 mg/dL Final   Cholesterol  Date Value Ref Range Status  07/28/2022 138 0 - 200 mg/dL Final   LDL Calculated  Date Value Ref Range Status  02/23/2018 57 0 - 99 mg/dL Final   LDL Cholesterol  Date Value Ref Range Status  07/28/2022 74 0 - 99 mg/dL Final    Comment:           Total Cholesterol/HDL:CHD Risk Coronary Heart Disease Risk Table                     Men   Women  1/2 Average Risk   3.4   3.3  Average Risk       5.0   4.4  2 X Average Risk   9.6   7.1  3 X Average Risk  23.4   11.0        Use the calculated Patient Ratio above and the CHD Risk Table to determine the patient's CHD Risk.        ATP III CLASSIFICATION (LDL):  <100     mg/dL   Optimal  962-952  mg/dL   Near or Above                    Optimal  130-159  mg/dL   Borderline  841-324  mg/dL   High  >401     mg/dL   Very High Performed at [facility], [address] [place], [place] [address]    HDL  Date Value Ref Range Status  07/28/2022 42 >40 mg/dL Final  36/64/4034 35 (L) >39 mg/dL Final   Triglycerides  Date Value Ref Range Status  07/28/2022 111 <150 mg/dL Final         Passed - Cr in normal range and within 360 days    Creatinine, Ser  Date Value Ref Range Status  11/01/2022 0.86 0.61 - 1.24 mg/dL Final         Passed - Patient is not pregnant      Passed - Valid encounter within last 12 months    Recent Outpatient Visits           3 weeks ago Uncontrolled type 2 diabetes mellitus with hyperglycemia (HCC)   [place] [facility]  Larae Grooms, NP   1 month ago Uncontrolled type 2 diabetes mellitus with hyperglycemia [facility])   [place] [facility] Larae Grooms, NP   2 months ago Uncontrolled type 2 diabetes mellitus with hyperglycemia [facility])   [place] [facility] Larae Grooms, NP   2 months ago Uncontrolled type 2 diabetes mellitus with hyperglycemia [facility])   [place] [facility] [place], Megan P, DO   5 months ago Hypertension associated with diabetes [facility])   [place] [facility] Larae Grooms, NP       Future Appointments             In 6 days Pearley, Sherran Needs, NP  [place] [facility], [facility]

## 2023-01-27 ENCOUNTER — Ambulatory Visit: Payer: 59 | Admitting: Family Medicine

## 2023-01-27 ENCOUNTER — Encounter: Payer: Self-pay | Admitting: Family Medicine

## 2023-01-27 VITALS — BP 110/78 | HR 83 | Temp 97.6°F | Wt 193.6 lb

## 2023-01-27 DIAGNOSIS — E1169 Type 2 diabetes mellitus with other specified complication: Secondary | ICD-10-CM

## 2023-01-27 DIAGNOSIS — E785 Hyperlipidemia, unspecified: Secondary | ICD-10-CM

## 2023-01-27 DIAGNOSIS — E1159 Type 2 diabetes mellitus with other circulatory complications: Secondary | ICD-10-CM

## 2023-01-27 DIAGNOSIS — E559 Vitamin D deficiency, unspecified: Secondary | ICD-10-CM | POA: Diagnosis not present

## 2023-01-27 DIAGNOSIS — E1165 Type 2 diabetes mellitus with hyperglycemia: Secondary | ICD-10-CM | POA: Diagnosis not present

## 2023-01-27 DIAGNOSIS — I152 Hypertension secondary to endocrine disorders: Secondary | ICD-10-CM | POA: Diagnosis not present

## 2023-01-27 DIAGNOSIS — Z7984 Long term (current) use of oral hypoglycemic drugs: Secondary | ICD-10-CM | POA: Diagnosis not present

## 2023-01-27 LAB — BAYER DCA HB A1C WAIVED: HB A1C (BAYER DCA - WAIVED): 7 % — ABNORMAL HIGH (ref 4.8–5.6)

## 2023-01-27 MED ORDER — TRULICITY 3 MG/0.5ML ~~LOC~~ SOAJ
3.0000 mg | SUBCUTANEOUS | 1 refills | Status: DC
Start: 2023-01-27 — End: 2023-05-13

## 2023-01-27 NOTE — Assessment & Plan Note (Signed)
Chronic, stable. Lipid panel done today. Continue taking Crestor 10mg  daily. Will treat based on lab results.

## 2023-01-27 NOTE — Assessment & Plan Note (Signed)
Acute, ongoing. Recheck Vitamin D levels today. Patient has 2 weeks left on regimen. Will treat based on results.

## 2023-01-27 NOTE — Assessment & Plan Note (Signed)
Chronic, stable. BP 110/78. Continue taking 12.5 mg Metoprolol and Valsartan 40mg  daily. Recommend check BP at home and bring log in at next visit, will consider d/c Metoprolol if dizzy symptoms remain and BP readings are low. Recommend staying hydrated with 90 oz water daily to help prevent dizzy spells.

## 2023-01-27 NOTE — Progress Notes (Signed)
BP 110/78   Pulse 83   Temp 97.6 F (36.4 C) (Oral)   Wt 193 lb 9.6 oz (87.8 kg)   SpO2 97%   BMI 29.44 kg/m    Subjective:    Patient ID: Keith Barber, male    DOB: 1971/05/19, 52 y.o.   MRN: [id number]  HPI: Keith Barber is a 52 y.o. male  Chief Complaint  Patient presents with   Diabetes   Hypertension   Hyperlipidemia   Will recheck Vitamin D levels since on regimen for 3 months, taking tablets weekly, has X2 tablets left.   DIABETES He is taking Metformin 500 mg x2/day and Trulicity 3mg /0.11ml weekly. Last visit A1c 11% today A1c is 7%.  Trulicity is going great, he has taken 2 weeks of Trulicity, has 2 weeks left. He has changed his diet to sugar free, less carbohydrates, he has been doing keto, to get away from carbs and sugar.  Hypoglycemic episodes:no Polydipsia/polyuria: no Visual disturbance: yes double vision, eyes checked and was told medicines may affect this; decreased metformin dose, vision improved after this.  Chest pain: no Paresthesias: no Glucose Monitoring: yes  Accucheck frequency: Daily  Fasting glucose:97-130 Taking Insulin?: no Blood Pressure Monitoring: not checking Retinal Examination: Up to Date Foot Exam: Up to Date Diabetic Education: Completed Influenza: Up to Date Aspirin:No  HYPERTENSION / HYPERLIPIDEMIA Taking Metoprolol 12.5 mg, valsartan 40mg , and Rosuvastatin 10 mg daily. Sometimes he will get dizzy and has to squeeze his temples for a few minutes, and getting blurred vision both eyes sometimes more of these symptoms once daily, he has not had migraines in forever, and there is no headache, he closes one eye and he can still see. Started 2-3 weeks ago after going up on trullicity.   Satisfied with current treatment? no Duration of hypertension: chronic BP monitoring frequency: not checking BP medication side effects: no Duration of hyperlipidemia: chronic Cholesterol medication side effects: no Cholesterol  supplements: none Medication compliance: excellent compliance Aspirin: no Recent stressors: no Recurrent headaches: yes he has had this all his life, taking triptan Visual changes: yes Palpitations: no Dyspnea: no Chest pain: no Lower extremity edema: no Dizzy/lightheaded: yes, with blurred vision, happens x3 weekly, started 2-3 weeks ago, notices different times of the day, mostly when moving around, woke up one time and had blurred vision. Had history of vertigo, did exercises and vertigo went away.    Relevant past medical, surgical, family and social history reviewed and updated as indicated. Interim medical history since our last visit reviewed. Allergies and medications reviewed and updated.  Review of Systems  Constitutional: Negative.   Eyes:  Positive for visual disturbance.  Respiratory: Negative.    Cardiovascular: Negative.  Negative for chest pain.  Endocrine: Positive for polyphagia. Negative for polydipsia and polyuria.  Genitourinary:  Negative for frequency.  Neurological:  Positive for dizziness, light-headedness and headaches. Negative for numbness.    Per HPI unless specifically indicated above     Objective:    BP 110/78   Pulse 83   Temp 97.6 F (36.4 C) (Oral)   Wt 193 lb 9.6 oz (87.8 kg)   SpO2 97%   BMI 29.44 kg/m   Wt Readings from Last 3 Encounters:  01/27/23 193 lb 9.6 oz (87.8 kg)  12/27/22 191 lb 9.6 oz (86.9 kg)  12/01/22 191 lb 8 oz (86.9 kg)    Physical Exam Vitals and nursing note reviewed.  Constitutional:      General: He is  awake. He is not in acute distress.    Appearance: Normal appearance. He is well-developed and well-groomed. He is not ill-appearing.  HENT:     Head: Normocephalic and atraumatic.     Right Ear: Hearing and external ear normal. No drainage.     Left Ear: Hearing and external ear normal. No drainage.     Nose: Nose normal.  Eyes:     General: Lids are normal.        Right eye: No discharge.        Left  eye: No discharge.     Conjunctiva/sclera: Conjunctivae normal.  Cardiovascular:     Rate and Rhythm: Normal rate and regular rhythm.     Pulses:          Radial pulses are 2+ on the right side and 2+ on the left side.       Posterior tibial pulses are 2+ on the right side and 2+ on the left side.     Heart sounds: Normal heart sounds, S1 normal and S2 normal. No murmur heard.    No gallop.  Pulmonary:     Effort: Pulmonary effort is normal. No accessory muscle usage or respiratory distress.     Breath sounds: Normal breath sounds.  Musculoskeletal:        General: Normal range of motion.     Cervical back: Full passive range of motion without pain and normal range of motion.     Right lower leg: No edema.     Left lower leg: No edema.  Skin:    General: Skin is warm and dry.     Capillary Refill: Capillary refill takes less than 2 seconds.  Neurological:     Mental Status: He is alert and oriented to person, place, and time.  Psychiatric:        Attention and Perception: Attention normal.        Mood and Affect: Mood normal.        Speech: Speech normal.        Behavior: Behavior normal. Behavior is cooperative.        Thought Content: Thought content normal.     Results for orders placed or performed in visit on 01/27/23  Bayer DCA Hb A1c Waived  Result Value Ref Range   HB A1C (BAYER DCA - WAIVED) 7.0 (H) 4.8 - 5.6 %      Assessment & Plan:   Problem List Items Addressed This Visit     Hypertension associated with diabetes (HCC)    Chronic, stable. BP 110/78. Continue taking 12.5 mg Metoprolol and Valsartan 40mg  daily. Recommend check BP at home and bring log in at next visit, will consider d/c Metoprolol if dizzy symptoms remain and BP readings are low. Recommend staying hydrated with 90 oz water daily to help prevent dizzy spells.       Relevant Medications   Dulaglutide (TRULICITY) 3 MG/0.5ML SOPN   Other Relevant Orders   Comp Met (CMET)   Hyperlipidemia  associated with type 2 diabetes mellitus (HCC)    Chronic, stable. Lipid panel done today. Continue taking Crestor 10mg  daily. Will treat based on lab results.       Relevant Medications   Dulaglutide (TRULICITY) 3 MG/0.5ML SOPN   Other Relevant Orders   Lipid Panel w/o Chol/HDL Ratio   Lipid Panel w/o Chol/HDL Ratio   Uncontrolled type 2 diabetes mellitus with hyperglycemia (HCC) - Primary    Chronic, uncontrolled. A1c today down from 11%  to 7%. Continue taking Metformin 500mg  BID and Trulicity 3 mg. Sugars ranging between 97-130. Will consider increasing Trulicity to 4.5mg  if A1c remains greater than 7 at one month follow up.       Relevant Medications   Dulaglutide (TRULICITY) 3 MG/0.5ML SOPN   Other Relevant Orders   Comp Met (CMET)   HgB A1c   Comp Met (CMET)   Bayer DCA Hb A1c Waived (Completed)   Vitamin D deficiency    Acute, ongoing. Recheck Vitamin D levels today. Patient has 2 weeks left on regimen. Will treat based on results.       Relevant Orders   Vitamin D (25 hydroxy)   Comp Met (CMET)   Vitamin D (25 hydroxy)     Follow up plan: Return in about 4 weeks (around 02/24/2023) for Follow up BP check, DM2.

## 2023-01-27 NOTE — Patient Instructions (Addendum)
Start checking BP at home, bring log at next visit. You can check BP during different times of the day so we can see the ranges, write these readings down.   Drink 90 oz water daily, stay hydrated to help prevent episodes of dizziness  Start using CPAP every night  Try biotene mouthwash

## 2023-01-27 NOTE — Assessment & Plan Note (Addendum)
Chronic, uncontrolled. A1c today down from 11% to 7%. Continue taking Metformin 500mg  BID and Trulicity 3 mg. Sugars ranging between 97-130. Will consider increasing Trulicity to 4.5mg  if A1c remains greater than 7 at one month follow up.

## 2023-01-28 LAB — COMPREHENSIVE METABOLIC PANEL
ALT: 27 IU/L (ref 0–44)
AST: 30 IU/L (ref 0–40)
Albumin: 4.4 g/dL (ref 3.8–4.9)
Alkaline Phosphatase: 91 IU/L (ref 44–121)
BUN/Creatinine Ratio: 13 (ref 9–20)
BUN: 14 mg/dL (ref 6–24)
Bilirubin Total: 0.9 mg/dL (ref 0.0–1.2)
CO2: 20 mmol/L (ref 20–29)
Calcium: 8.6 mg/dL — ABNORMAL LOW (ref 8.7–10.2)
Chloride: 107 mmol/L — ABNORMAL HIGH (ref 96–106)
Creatinine, Ser: 1.05 mg/dL (ref 0.76–1.27)
Globulin, Total: 1.9 g/dL (ref 1.5–4.5)
Glucose: 111 mg/dL — ABNORMAL HIGH (ref 70–99)
Potassium: 4.7 mmol/L (ref 3.5–5.2)
Sodium: 140 mmol/L (ref 134–144)
Total Protein: 6.3 g/dL (ref 6.0–8.5)
eGFR: 85 mL/min/{1.73_m2} (ref >59)

## 2023-01-28 LAB — LIPID PANEL W/O CHOL/HDL RATIO
Cholesterol, Total: 125 mg/dL (ref 100–199)
HDL: 37 mg/dL — ABNORMAL LOW (ref >39)
LDL Chol Calc (NIH): 58 mg/dL (ref 0–99)
Triglycerides: 176 mg/dL — ABNORMAL HIGH (ref 0–149)
VLDL Cholesterol Cal: 30 mg/dL (ref 5–40)

## 2023-01-28 LAB — VITAMIN D 25 HYDROXY (VIT D DEFICIENCY, FRACTURES): Vit D, 25-Hydroxy: 32.7 ng/mL (ref 30.0–100.0)

## 2023-01-31 NOTE — Progress Notes (Signed)
Hi Keith Barber, your electrolytes, kidney function, liver function, and vitamin D levels are normal. There was some elevation in your triglycerides and a decrease in your HDL levels, ensure you are taking your Crestor 10 mg daily as prescribed. Thank you for allowing me to participate in your care!

## 2023-02-12 DIAGNOSIS — G4719 Other hypersomnia: Secondary | ICD-10-CM | POA: Diagnosis not present

## 2023-02-12 DIAGNOSIS — G4733 Obstructive sleep apnea (adult) (pediatric): Secondary | ICD-10-CM | POA: Diagnosis not present

## 2023-02-21 ENCOUNTER — Encounter: Payer: Self-pay | Admitting: Oncology

## 2023-02-21 ENCOUNTER — Inpatient Hospital Stay: Payer: 59 | Admitting: Oncology

## 2023-02-21 ENCOUNTER — Encounter: Payer: Self-pay | Admitting: Family Medicine

## 2023-02-21 ENCOUNTER — Inpatient Hospital Stay: Payer: 59 | Attending: Oncology

## 2023-02-21 ENCOUNTER — Inpatient Hospital Stay: Payer: 59

## 2023-02-21 VITALS — BP 115/87 | HR 95 | Temp 98.6°F | Resp 18 | Wt 193.7 lb

## 2023-02-21 DIAGNOSIS — Z8719 Personal history of other diseases of the digestive system: Secondary | ICD-10-CM | POA: Diagnosis not present

## 2023-02-21 DIAGNOSIS — R160 Hepatomegaly, not elsewhere classified: Secondary | ICD-10-CM | POA: Diagnosis not present

## 2023-02-21 DIAGNOSIS — Z888 Allergy status to other drugs, medicaments and biological substances status: Secondary | ICD-10-CM | POA: Diagnosis not present

## 2023-02-21 DIAGNOSIS — Z87891 Personal history of nicotine dependence: Secondary | ICD-10-CM | POA: Insufficient documentation

## 2023-02-21 DIAGNOSIS — Z801 Family history of malignant neoplasm of trachea, bronchus and lung: Secondary | ICD-10-CM | POA: Insufficient documentation

## 2023-02-21 DIAGNOSIS — R110 Nausea: Secondary | ICD-10-CM | POA: Insufficient documentation

## 2023-02-21 DIAGNOSIS — D751 Secondary polycythemia: Secondary | ICD-10-CM

## 2023-02-21 DIAGNOSIS — K760 Fatty (change of) liver, not elsewhere classified: Secondary | ICD-10-CM | POA: Insufficient documentation

## 2023-02-21 DIAGNOSIS — Z79899 Other long term (current) drug therapy: Secondary | ICD-10-CM | POA: Diagnosis not present

## 2023-02-21 DIAGNOSIS — R5383 Other fatigue: Secondary | ICD-10-CM | POA: Diagnosis not present

## 2023-02-21 DIAGNOSIS — I10 Essential (primary) hypertension: Secondary | ICD-10-CM | POA: Diagnosis not present

## 2023-02-21 DIAGNOSIS — G4730 Sleep apnea, unspecified: Secondary | ICD-10-CM | POA: Insufficient documentation

## 2023-02-21 DIAGNOSIS — K5730 Diverticulosis of large intestine without perforation or abscess without bleeding: Secondary | ICD-10-CM | POA: Insufficient documentation

## 2023-02-21 DIAGNOSIS — Z833 Family history of diabetes mellitus: Secondary | ICD-10-CM | POA: Diagnosis not present

## 2023-02-21 DIAGNOSIS — Z8249 Family history of ischemic heart disease and other diseases of the circulatory system: Secondary | ICD-10-CM | POA: Insufficient documentation

## 2023-02-21 LAB — CBC WITH DIFFERENTIAL/PLATELET
Abs Immature Granulocytes: 0.05 10*3/uL (ref 0.00–0.07)
Basophils Absolute: 0 10*3/uL (ref 0.0–0.1)
Basophils Relative: 1 %
Eosinophils Absolute: 0.2 10*3/uL (ref 0.0–0.5)
Eosinophils Relative: 3 %
HCT: 48.6 % (ref 39.0–52.0)
Hemoglobin: 16.6 g/dL (ref 13.0–17.0)
Immature Granulocytes: 1 %
Lymphocytes Relative: 25 %
Lymphs Abs: 2.2 10*3/uL (ref 0.7–4.0)
MCH: 29.5 pg (ref 26.0–34.0)
MCHC: 34.2 g/dL (ref 30.0–36.0)
MCV: 86.3 fL (ref 80.0–100.0)
Monocytes Absolute: 0.7 10*3/uL (ref 0.1–1.0)
Monocytes Relative: 8 %
Neutro Abs: 5.6 10*3/uL (ref 1.7–7.7)
Neutrophils Relative %: 62 %
Platelets: 209 10*3/uL (ref 150–400)
RBC: 5.63 MIL/uL (ref 4.22–5.81)
RDW: 12.8 % (ref 11.5–15.5)
WBC: 8.8 10*3/uL (ref 4.0–10.5)
nRBC: 0 % (ref 0.0–0.2)

## 2023-02-21 NOTE — Progress Notes (Signed)
Pt here for follow up. No new heme issues.

## 2023-02-21 NOTE — Progress Notes (Signed)
Hematology/Oncology Progress note [phone] [phone]     CHIEF COMPLAINTS/REASON FOR VISIT:  Follow up for erythrocytosis  ASSESSMENT & PLAN:   Erythrocytosis Erythrocytosis due to sleep apnea Labs are reviewed and discussed with patient. Hb has improved, Hct <52, hold off phlebotomy.  normal erythropoietin, slightly elevated carbo monoxide level JAK2 V617F mutation negative, with reflex to other mutations CALR, MPL, JAK 2 Ex 12-15 mutations negative. negative BCR-ABL1 FISH.    Labs are reviewed and discussed with patient.  No phlebotomy today   Orders Placed This Encounter  Procedures   CBC with Differential (Cancer Center Only)    Standing Status:   Future    Standing Expiration Date:   02/21/2024   Follow up 6 months.  All questions were answered. The patient knows to call the clinic with any problems, questions or concerns.  Rickard Patience, MD, PhD [facility] Hematology Oncology 02/21/2023      HISTORY OF PRESENTING ILLNESS:   Keith Barber is a  52 y.o.  male who presents for follow up of erythrocytosis.    Patient has abnormal CBC with hemoglobin of 18.5, Hct 55,  Reviewed patient's previous labs.  elevated hemoglobin is chronic onset, dated back to 2018.   Patient denies unintentional weight loss, fever, chills, fatigue, night sweats, cough, SOB, chest pain.  Former smoker, 13 pack year smoking history,  Denies previous VTE history. Denies known history of sleep apnea. He denies any testosterone use.   + fatigue, not refreshed when he wakes up in the morning. + nausea, no vomiting, improves as day goes along.  Family history of small cell lung cancer.   08/10/22 CT abdomen pelvis showed   1. No acute abnormality identified within the abdomen or specifically, no abdominal wall hernia visualized. 2. Left-sided colonic diverticulosis without findings of acute diverticulitis. 3. Hepatomegaly with diffuse hepatic  steatosis.  INTERVAL HISTORY Keith Barber is a 52 y.o. male who has above history reviewed by me today presents for follow up visit for erythrocytosis.  During the interval he has been diagnosed with sleep apnea and was recommended to use CPAP machine. He has some difficulty with CPAP mask and has not been using every day. He is waiting for the company to call back for trouble shooting.   MEDICAL HISTORY:  Past Medical History:  Diagnosis Date   Bone spur    LEFT SHOULDER THAT MAKES HIS LEFT ARM TINGLE - had removed   Diverticulitis large intestine 09/2017   GERD (gastroesophageal reflux disease)    Hemorrhoids    Hyperlipidemia    Hypertension    Migraines    rare migraines   Sleep apnea     SURGICAL HISTORY: Past Surgical History:  Procedure Laterality Date   20 HOUR PH STUDY N/A 05/30/2018   Procedure: 24 HOUR PH STUDY;  Surgeon: Pasty Spillers, MD;  Location: [facility] ENDOSCOPY;  Service: Gastroenterology;  Laterality: N/A;   bicep tendon tear      COLONOSCOPY WITH PROPOFOL N/A 02/01/2018   Procedure: COLONOSCOPY WITH PROPOFOL;  Surgeon: Pasty Spillers, MD;  Location: [facility] ENDOSCOPY;  Service: Endoscopy;  Laterality: N/A;   COLONOSCOPY WITH PROPOFOL N/A 08/30/2022   Procedure: COLONOSCOPY WITH PROPOFOL;  Surgeon: Wyline Mood, MD;  Location: [facility]COPY;  Service: Gastroenterology;  Laterality: N/A;   ESOPHAGEAL MANOMETRY N/A 05/30/2018   Procedure: ESOPHAGEAL MANOMETRY (EM);  Surgeon: Pasty Spillers, MD;  Location: [facility] ENDOSCOPY;  Service: Gastroenterology;  Laterality: N/A;   ESOPHAGOGASTRODUODENOSCOPY (EGD) WITH  PROPOFOL N/A 02/01/2018   Procedure: ESOPHAGOGASTRODUODENOSCOPY (EGD) WITH PROPOFOL;  Surgeon: Pasty Spillers, MD;  Location: [facility] ENDOSCOPY;  Service: Endoscopy;  Laterality: N/A;   INGUINAL HERNIA REPAIR Bilateral 01/21/2017   Procedure: LAPAROSCOPIC BILATERAL INGUINAL HERNIA REPAIR;  Surgeon: Leafy Ro, MD;  Location: [facility] ORS;   Service: General;  Laterality: Bilateral;   KNEE SURGERY Left    SHOULDER ARTHROSCOPY WITH DISTAL CLAVICLE RESECTION Left 07/05/2017   Procedure: LEFT SHOULDER ARTHROSCOPY, SUBACROMIAL DECOMPRESSION, DISTAL CLAVICLE RESECTION, POSSIBLE MINI OPEN ROTATOR CUFF REPAIR;  Surgeon: Valeria Batman, MD;  Location: [facility] OR;  Service: Orthopedics;  Laterality: Left;   SHOULDER CLOSED REDUCTION Left 12/13/2017   Procedure: LEFT SHOULDER MANIPULATION with steroid injection;  Surgeon: Valeria Batman, MD;  Location: [facility] OR;  Service: Orthopedics;  Laterality: Left;   SHOULDER SURGERY Right    UMBILICAL HERNIA REPAIR N/A 01/21/2017   Procedure: HERNIA REPAIR UMBILICAL ADULT;  Surgeon: Leafy Ro, MD;  Location: [facility] ORS;  Service: General;  Laterality: N/A;    SOCIAL HISTORY: Social History   Socioeconomic History   Marital status: Divorced    Spouse name: Not on file   Number of children: Not on file   Years of education: Not on file   Highest education level: 12th grade  Occupational History   Not on file  Tobacco Use   Smoking status: Former    Current packs/day: 0.00    Average packs/day: 0.5 packs/day for 26.0 years (13.0 ttl pk-yrs)    Types: Cigarettes    Start date: 01/14/1991    Quit date: 01/13/2017    Years since quitting: 6.1   Smokeless tobacco: Never  Vaping Use   Vaping status: Never Used  Substance and Sexual Activity   Alcohol use: No   Drug use: No   Sexual activity: Yes  Other Topics Concern   Not on file  Social History Narrative   Not on file   Social Determinants of Health   Financial Resource Strain: Low Risk  (11/01/2022)   Overall Financial Resource Strain (CARDIA)    Difficulty of Paying Living Expenses: Not hard at all  Food Insecurity: No Food Insecurity (11/01/2022)   Hunger Vital Sign    Worried About Running Out of Food in the Last Year: Never true    Ran Out of Food in the Last Year: Never true  Transportation Needs: No Transportation Needs  (11/01/2022)   PRAPARE - Administrator, Civil Service (Medical): No    Lack of Transportation (Non-Medical): No  Physical Activity: Insufficiently Active (11/01/2022)   Exercise Vital Sign    Days of Exercise per Week: 3 days    Minutes of Exercise per Session: 30 min  Stress: No Stress Concern Present (11/01/2022)   [employer] of Occupational Health - Occupational Stress Questionnaire    Feeling of Stress : Only a little  Social Connections: Socially Isolated (11/01/2022)   Social Connection and Isolation Panel [NHANES]    Frequency of Communication with Friends and Family: More than three times a week    Frequency of Social Gatherings with Friends and Family: Once a week    Attends Religious Services: Never    Database administrator or Organizations: No    Attends Engineer, structural: Not on file    Marital Status: Divorced  Catering manager Violence: Not on file    FAMILY HISTORY: Family History  Problem Relation Age of Onset   Diabetes Mother  Migraines Mother    Lung cancer Father    Lung cancer Paternal Grandfather    Cancer Neg Hx    COPD Neg Hx    Heart disease Neg Hx    Stroke Neg Hx    Sleep apnea Neg Hx     ALLERGIES:  is allergic to amlodipine.  MEDICATIONS:  Current Outpatient Medications  Medication Sig Dispense Refill   Accu-Chek Softclix Lancets lancets 1 each by Other route in the morning and at bedtime. Use as instructed 100 each 6   Blood Glucose Monitoring Suppl (ACCU-CHEK AVIVA PLUS) w/Device KIT 1 each by Does not apply route in the morning and at bedtime. 1 kit 0   Dulaglutide (TRULICITY) 3 MG/0.5ML SOPN Inject 3 mg as directed once a week. 3 mL 1   glucose blood (ACCU-CHEK AVIVA PLUS) test strip 1 each by Other route in the morning and at bedtime. Use as instructed 100 each 6   metFORMIN (GLUCOPHAGE) 500 MG tablet Take 1 tablet (500 mg total) by mouth 2 (two) times daily with a meal. 180 tablet 1   metoprolol succinate  (TOPROL-XL) 25 MG 24 hr tablet Take 1 tablet by mouth once daily (Patient taking differently: Take 25 mg by mouth daily. Takes 1/2 tablet daily) 90 tablet 1   naproxen sodium (ALEVE) 220 MG tablet Take 2 tablets by mouth daily.     rizatriptan (MAXALT) 10 MG tablet Take 1 tablet by mouth twice daily as needed 180 tablet 0   rosuvastatin (CRESTOR) 10 MG tablet Take 1 tablet by mouth once daily 90 tablet 0   valsartan (DIOVAN) 40 MG tablet Take 1 tablet by mouth once daily 90 tablet 0   Vitamin D, Ergocalciferol, (DRISDOL) 1.25 MG (50000 UNIT) CAPS capsule Take 1 capsule (50,000 Units total) by mouth every 7 (seven) days. 12 capsule 1   No current facility-administered medications for this visit.     Review of Systems  Constitutional:  Positive for fatigue. Negative for appetite change, chills, fever and unexpected weight change.  HENT:   Negative for hearing loss and voice change.   Eyes:  Negative for eye problems and icterus.  Respiratory:  Negative for chest tightness, cough and shortness of breath.   Cardiovascular:  Negative for chest pain and leg swelling.  Gastrointestinal:  Negative for abdominal distention, abdominal pain and nausea.  Endocrine: Negative for hot flashes.  Genitourinary:  Negative for difficulty urinating, dysuria and frequency.   Musculoskeletal:  Negative for arthralgias.  Skin:  Negative for itching and rash.  Neurological:  Negative for light-headedness and numbness.  Hematological:  Negative for adenopathy. Does not bruise/bleed easily.  Psychiatric/Behavioral:  Negative for confusion.     PHYSICAL EXAMINATION: ECOG PERFORMANCE STATUS: 0 - Asymptomatic Vitals:   02/21/23 1358  BP: 115/87  Pulse: 95  Resp: 18  Temp: 98.6 F (37 C)   Filed Weights   02/21/23 1358  Weight: 193 lb 11.2 oz (87.9 kg)    Physical Exam Constitutional:      General: He is not in acute distress.    Appearance: He is obese.  HENT:     Head: Normocephalic.  Eyes:      General: No scleral icterus. Cardiovascular:     Rate and Rhythm: Normal rate.  Pulmonary:     Effort: Pulmonary effort is normal. No respiratory distress.  Abdominal:     General: There is no distension.  Musculoskeletal:        General: Normal range of motion.  Cervical back: Normal range of motion.  Skin:    Findings: No rash.  Neurological:     Mental Status: He is alert and oriented to person, place, and time. Mental status is at baseline.     Cranial Nerves: No cranial nerve deficit.  Psychiatric:        Mood and Affect: Mood normal.     LABORATORY DATA:  I have reviewed the data as listed    Latest Ref Rng & Units 02/21/2023    1:39 PM 11/01/2022   12:11 PM 08/23/2022    1:07 PM  CBC  WBC 4.0 - 10.5 K/uL 8.8  7.5  9.1   Hemoglobin 13.0 - 17.0 g/dL 04.5  40.9  81.1   Hematocrit 39.0 - 52.0 % 48.6  50.8  48.4   Platelets 150 - 400 K/uL 209  183  191       Latest Ref Rng & Units 01/27/2023    9:37 AM 11/01/2022   12:11 PM 07/28/2022   11:12 AM  CMP  Glucose 70 - 99 mg/dL 914  782  956   BUN 6 - 24 mg/dL 14  13  13    Creatinine 0.76 - 1.27 mg/dL 2.13  0.86  5.78   Sodium 134 - 144 mmol/L 140  138  138   Potassium 3.5 - 5.2 mmol/L 4.7  4.3  4.6   Chloride 96 - 106 mmol/L 107  104  103   CO2 20 - 29 mmol/L 20  27  26    Calcium 8.7 - 10.2 mg/dL 8.6  8.4  9.4   Total Protein 6.0 - 8.5 g/dL 6.3  6.9  8.3   Total Bilirubin 0.0 - 1.2 mg/dL 0.9  1.4  1.0   Alkaline Phos 44 - 121 IU/L 91  100  104   AST 0 - 40 IU/L 30  29  29    ALT 0 - 44 IU/L 27  30  35      RADIOGRAPHIC STUDIES: I have personally reviewed the radiological images as listed and agreed with the findings in the report. No results found.

## 2023-02-21 NOTE — Assessment & Plan Note (Addendum)
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 Hb has improved, Hct <52, hold off phlebotomy.  normal erythropoietin, slightly elevated carbo monoxide level JAK2 V617F mutation negative, with reflex to other mutations CALR, MPL, JAK 2 Ex 12-15 mutations negative. negative BCR-ABL1 FISH.    Labs are reviewed and discussed with patient.  No phlebotomy today

## 2023-02-22 NOTE — Telephone Encounter (Signed)
Called and scheduled patient via MyChart Video Visit on 02/23/2023 @ 11:20 am.

## 2023-02-23 ENCOUNTER — Encounter: Payer: Self-pay | Admitting: Physician Assistant

## 2023-02-23 ENCOUNTER — Telehealth (INDEPENDENT_AMBULATORY_CARE_PROVIDER_SITE_OTHER): Payer: 59 | Admitting: Physician Assistant

## 2023-02-23 VITALS — BP 121/88 | HR 81

## 2023-02-23 DIAGNOSIS — U071 COVID-19: Secondary | ICD-10-CM

## 2023-02-23 NOTE — Patient Instructions (Signed)
At this time I recommend symptomatic management for your symptoms  You can use Pepto to help with your GI concerns- try to avoid triggers for diarrhea  You can use Dayquil/Nyquil, theraflu, liquid ibuprofen as needed  If you start to have shortness of breath, chest pain, leg swelling, fevers, confusion, please go to the ED for evaluation.

## 2023-02-23 NOTE — Progress Notes (Signed)
Virtual Visit via Video Note  I connected with Keith Barber on 02/23/23 at 11:20 AM EDT by a video enabled telemedicine application and verified that I am speaking with the correct person using two identifiers.  Today's Provider: Jacquelin Hawking, MHS, PA-C Introduced myself to the patient as a PA-C and provided education on APPs in clinical practice.   Location: Patient: at home  Provider: [facility], [place], [place]    I discussed the limitations of evaluation and management by telemedicine and the availability of in person appointments. The patient expressed understanding and agreed to proceed.   Chief Complaint  Patient presents with   Covid Positive    Patient says he did a at home COVID a few days ago and it was positive. Patient says he has some lingering sore throat and coughing. Patient says he first noticed symptoms Tuesday night, a week ago. Patient says he has taken over the counter cough drops and it did not help his throat. Patient says he feel like his glands feel swollen.    Sore Throat   Cough   Ear Pain   Dysphagia    History of Present Illness:  COVID positive  Symptoms started last Tuesday while he was at the beach  Started with sore throat then became gradually worse through Monday 02/21/23   He reports he is having some sore throat but it is improving.  He has some right ear pain with swallowing and some mild coughing  Reports he did have some SOB earlier on but this has resolved   He tested positive for COVID  last week   Interventions: cough drops are helping a bit  Reports swallowing pills was a bit difficult a few days ago due to sore throat    Review of Systems  Constitutional:  Positive for chills. Negative for fever and malaise/fatigue.  HENT:  Positive for ear pain and sore throat. Negative for congestion and nosebleeds.   Respiratory:  Positive for cough. Negative for shortness of breath and wheezing.   Gastrointestinal:   Positive for diarrhea. Negative for nausea and vomiting.  Musculoskeletal:  Negative for myalgias.  Neurological:  Negative for dizziness and headaches.    Observations/Objective:   Due to the nature of the virtual visit, physical exam and observations are limited. Able to obtain the following observations:   Alert, oriented, x3  Appears comfortable, in no acute distress.  No scleral injection, no appreciated hoarseness, tachypnea, wheeze or strider.  Able to maintain conversation without visible strain.  No cough appreciated during visit.    Assessment and Plan:  Problem List Items Addressed This Visit   None Visit Diagnoses     COVID-19    -  Primary Acute, new concern Reports he started having symptoms about a week ago and tested positive for COVID Reports some lingering sore throat, ear pain and coughing but this is improving Recommend OTC medications for symptomatic management given patient is outside antiviral window Reviewed appropriate options for management Reviewed ED and return precautions. Follow up as needed for persistent or progressing symptoms         Follow Up Instructions:    I discussed the assessment and treatment plan with the patient. The patient was provided an opportunity to ask questions and all were answered. The patient agreed with the plan and demonstrated an understanding of the instructions.   The patient was advised to call back or seek an in-person evaluation if the symptoms worsen or if  the condition fails to improve as anticipated.  I provided 15 minutes of non-face-to-face time during this encounter.   No follow-ups on file.   I, Abdelrahman Nair E Kyung Muto, PA-C, have reviewed all documentation for this visit. The documentation on 02/23/23 for the exam, diagnosis, procedures, and orders are all accurate and complete.   Jacquelin Hawking, MHS, PA-C [facility]

## 2023-02-24 ENCOUNTER — Ambulatory Visit: Payer: 59 | Admitting: Family Medicine

## 2023-02-28 ENCOUNTER — Encounter: Payer: Self-pay | Admitting: Family Medicine

## 2023-02-28 ENCOUNTER — Ambulatory Visit: Payer: 59 | Admitting: Family Medicine

## 2023-02-28 ENCOUNTER — Other Ambulatory Visit: Payer: Self-pay | Admitting: Nurse Practitioner

## 2023-02-28 VITALS — BP 111/72 | HR 86 | Ht 68.0 in | Wt 192.0 lb

## 2023-02-28 DIAGNOSIS — E785 Hyperlipidemia, unspecified: Secondary | ICD-10-CM

## 2023-02-28 DIAGNOSIS — G43801 Other migraine, not intractable, with status migrainosus: Secondary | ICD-10-CM | POA: Diagnosis not present

## 2023-02-28 DIAGNOSIS — E1159 Type 2 diabetes mellitus with other circulatory complications: Secondary | ICD-10-CM | POA: Diagnosis not present

## 2023-02-28 DIAGNOSIS — E1169 Type 2 diabetes mellitus with other specified complication: Secondary | ICD-10-CM | POA: Diagnosis not present

## 2023-02-28 DIAGNOSIS — Z7984 Long term (current) use of oral hypoglycemic drugs: Secondary | ICD-10-CM | POA: Diagnosis not present

## 2023-02-28 DIAGNOSIS — E1165 Type 2 diabetes mellitus with hyperglycemia: Secondary | ICD-10-CM

## 2023-02-28 DIAGNOSIS — Z23 Encounter for immunization: Secondary | ICD-10-CM

## 2023-02-28 DIAGNOSIS — I152 Hypertension secondary to endocrine disorders: Secondary | ICD-10-CM

## 2023-02-28 MED ORDER — CYCLOBENZAPRINE HCL 5 MG PO TABS: 5.0000 mg | ORAL_TABLET | Freq: Every day | ORAL | 1 refills | Status: DC | PRN

## 2023-02-28 MED ORDER — AMITRIPTYLINE HCL 10 MG PO TABS: 10.0000 mg | ORAL_TABLET | Freq: Every day | ORAL | 1 refills | Status: DC

## 2023-02-28 NOTE — Progress Notes (Signed)
BP 111/72   Pulse 86   Ht 5\' 8"  (1.727 m)   Wt 192 lb (87.1 kg)   SpO2 96%   BMI 29.19 kg/m    Subjective:    Patient ID: Keith Barber, male    DOB: [birth year], 52 y.o.   MRN: [id number]  HPI: Gibril Erkkila is a 52 y.o. male  Chief Complaint  Patient presents with   Hypertension   Diabetes   Plasma Donor     Patient says he gives plasma and says they had to adjust the needle twice in his L arm and had to alternate to his R arm. Patient says he donates plasma on Wednesdays and Fridays. Patient says he was informed that his L arm is starting to clot and wanted to make provider aware at today's visit.    He complains of having dizzy spells, which could be related to his history of vertigo. He notices it more frequently when changing positions quickly. He admits he is not eating x3 balanced meals daily and will have dinner in the late evenings some times. His water intake is limited to 1-2 water bottles daily.  MIGRAINES Has been dealing with this since childhood. He was previously taking Rizatriptan 10 mg as needed but ran out of refills.  Duration: chronic Onset:  sudden and gradual Severity: 3/10 Quality: stabbing and throbbing Frequency: constant daily  Location: Frontal lobe bilateral, and at back of neck (muscle relaxer helped) 2 months ago Headache duration:2 hours -all day Radiation: yes starting at eyes and goes to skull Time of day headache occurs: None Alleviating factors: Advil Migraine Aggravating factors: None Headache status at time of visit: asymptomatic Treatments attempted: Treatments attempted:  Advil migraine    Aura: yes floaters, photsensitivity Nausea:  no Vomiting: no Photophobia:  yes Phonophobia:  no Effect on social functioning:  no Numbers of missed days of school/work each month:  Confusion:  no Gait disturbance/ataxia:  no Behavioral changes:  no Fevers:  no   HYPERTENSION / HYPERLIPIDEMIA- continue meds Taking Valsartan 40  mg  and metoprolol 12.5 mg, (2021)Rosuvastatin 10 mg. He admits he does eat salt, but not a lot, he is working on limiting his salt intake.   Satisfied with current treatment? yes Duration of hypertension: chronic BP monitoring frequency: daily BP range: 99-138/61-90 BP medication side effects: no Duration of hyperlipidemia: chronic Cholesterol medication side effects: no Cholesterol supplements: none Past cholesterol medications:  Medication compliance: excellent compliance Aspirin: no Recent stressors: no Recurrent headaches: yes Visual changes: yes blurred vision without migraine both eyes more left eye, sometimes wakes up with blurred vision bright light may trigger, goes away in 10-15 minutes Palpitations: no Dyspnea: no Chest pain: no Lower extremity edema: no Dizzy/lightheaded: yes he has history Vertigo but experience is not similar:   DIABETES He is taking Trulicity 3 mg weekly and Metformin 500 mg BID Hypoglycemic episodes:no Polydipsia/polyuria: no Visual disturbance: no Chest pain: no Paresthesias: no Glucose Monitoring: yes  Accucheck frequency: Daily  Fasting glucose:111-121  Evening:<100 Blood Pressure Monitoring: daily Retinal Examination: Up to Date Foot Exam: Up to Date Diabetic Education: Completed Influenza: Not up to date Aspirin: no   Relevant past medical, surgical, family and social history reviewed and updated as indicated. Interim medical history since our last visit reviewed. Allergies and medications reviewed and updated.  Review of Systems  Constitutional: Negative.  Negative for fever.  Eyes:  Positive for photophobia. Negative for visual disturbance.  Respiratory: Negative.  Cardiovascular: Negative.  Negative for chest pain.  Endocrine: Negative.  Negative for polydipsia, polyphagia and polyuria.  Genitourinary: Negative.  Negative for frequency.  Neurological:  Positive for dizziness and headaches. Negative for numbness.    Per  HPI unless specifically indicated above     Objective:    BP 111/72   Pulse 86   Ht 5\' 8"  (1.727 m)   Wt 192 lb (87.1 kg)   SpO2 96%   BMI 29.19 kg/m   Wt Readings from Last 3 Encounters:  02/28/23 192 lb (87.1 kg)  02/21/23 193 lb 11.2 oz (87.9 kg)  01/27/23 193 lb 9.6 oz (87.8 kg)    Physical Exam Vitals and nursing note reviewed.  Constitutional:      General: He is awake. He is not in acute distress.    Appearance: Normal appearance. He is well-developed, well-groomed and overweight. He is not ill-appearing.  HENT:     Head: Normocephalic and atraumatic.     Right Ear: Hearing and external ear normal. No drainage.     Left Ear: Hearing and external ear normal. No drainage.     Nose: Nose normal.  Eyes:     General: Lids are normal.        Right eye: No discharge.        Left eye: No discharge.     Conjunctiva/sclera: Conjunctivae normal.  Cardiovascular:     Rate and Rhythm: Normal rate and regular rhythm.     Pulses:          Radial pulses are 2+ on the right side and 2+ on the left side.       Posterior tibial pulses are 2+ on the right side and 2+ on the left side.     Heart sounds: Normal heart sounds, S1 normal and S2 normal. No murmur heard.    No gallop.  Pulmonary:     Effort: Pulmonary effort is normal. No accessory muscle usage or respiratory distress.     Breath sounds: Normal breath sounds.  Musculoskeletal:        General: Normal range of motion.     Cervical back: Full passive range of motion without pain and normal range of motion.     Right lower leg: No edema.     Left lower leg: No edema.  Skin:    General: Skin is warm and dry.     Capillary Refill: Capillary refill takes less than 2 seconds.  Neurological:     Mental Status: He is alert and oriented to person, place, and time.  Psychiatric:        Attention and Perception: Attention normal.        Mood and Affect: Mood normal.        Speech: Speech normal.        Behavior: Behavior  normal. Behavior is cooperative.        Thought Content: Thought content normal.     Results for orders placed or performed in visit on 02/21/23  CBC with Differential/Platelet  Result Value Ref Range   WBC 8.8 4.0 - 10.5 K/uL   RBC 5.63 4.22 - 5.81 MIL/uL   Hemoglobin 16.6 13.0 - 17.0 g/dL   HCT 11.9 14.7 - 82.9 %   MCV 86.3 80.0 - 100.0 fL   MCH 29.5 26.0 - 34.0 pg   MCHC 34.2 30.0 - 36.0 g/dL   RDW 56.2 13.0 - 86.5 %   Platelets 209 150 - 400 K/uL  nRBC 0.0 0.0 - 0.2 %   Neutrophils Relative % 62 %   Neutro Abs 5.6 1.7 - 7.7 K/uL   Lymphocytes Relative 25 %   Lymphs Abs 2.2 0.7 - 4.0 K/uL   Monocytes Relative 8 %   Monocytes Absolute 0.7 0.1 - 1.0 K/uL   Eosinophils Relative 3 %   Eosinophils Absolute 0.2 0.0 - 0.5 K/uL   Basophils Relative 1 %   Basophils Absolute 0.0 0.0 - 0.1 K/uL   Immature Granulocytes 1 %   Abs Immature Granulocytes 0.05 0.00 - 0.07 K/uL      Assessment & Plan:   Problem List Items Addressed This Visit     Hypertension associated with diabetes (HCC)    Chronic, stable. BP 111/72. Continue taking 12.5 mg Metoprolol and Valsartan 40mg  daily. Continue checking BP at home and bring log in at next visit. Recommend staying hydrated with 90 oz water daily.       Hyperlipidemia associated with type 2 diabetes mellitus (HCC)    Chronic, stable. Continue taking Crestor 10mg  daily. Lipid panel stable. Return in 6 months. Call sooner if concerns arise.      Uncontrolled type 2 diabetes mellitus with hyperglycemia (HCC)    Chronic, uncontrolled. A1c down from 11% to 7%. Continue taking Metformin 500mg  BID and Trulicity 3 mg. Sugars improved ranging between 97-120. Will consider increasing Trulicity to 4.5mg  if A1c remains greater than 7 at one month follow up.       Ocular migraine with status migrainosus    Chronic, ongoing. Start Amitriptyline 10 MG daily for maintenace, Flexeril 5 MG given. Return in 1 month. Call sooner if concerns arise.        Relevant Medications   amitriptyline (ELAVIL) 10 MG tablet   cyclobenzaprine (FLEXERIL) 5 MG tablet   Other Visit Diagnoses     Need for shingles vaccine    -  Primary   Relevant Orders   Zoster Recombinant (Shingrix ) (Completed)        Follow up plan: Return in about 1 month (around 03/31/2023) for Headache medication and A1C , Follow up.

## 2023-03-01 NOTE — Telephone Encounter (Signed)
Requested Prescriptions  Pending Prescriptions Disp Refills   rizatriptan (MAXALT) 10 MG tablet [Pharmacy Med Name: Rizatriptan Benzoate 10 MG Oral Tablet] 18 tablet 0    Sig: Take 1 tablet by mouth twice daily as needed     Neurology:  Migraine Therapy - Triptan Passed - 02/28/2023 11:01 AM      Passed - Last BP in normal range    BP Readings from Last 1 Encounters:  02/28/23 111/72         Passed - Valid encounter within last 12 months    Recent Outpatient Visits           Yesterday Need for shingles vaccine   [place] [facility], Sherran Needs, NP   6 days ago COVID-19   [place] [facility] Mecum, Oswaldo Conroy, PA-C   1 month ago Uncontrolled type 2 diabetes mellitus with hyperglycemia [facility])   [place] [facility] [place], Sherran Needs, NP   2 months ago Uncontrolled type 2 diabetes mellitus with hyperglycemia [facility])   [place] [facility] Larae Grooms, NP   3 months ago Uncontrolled type 2 diabetes mellitus with hyperglycemia [facility])   [place] [facility] Larae Grooms, NP       Future Appointments             In 1 month Mecum, Oswaldo Conroy, PA-C [place] [facility]   In 2 months Pearley, Sherran Needs, NP  [facility], [facility]

## 2023-03-04 DIAGNOSIS — G43801 Other migraine, not intractable, with status migrainosus: Secondary | ICD-10-CM

## 2023-03-04 HISTORY — DX: Other migraine, not intractable, with status migrainosus: G43.801

## 2023-03-04 NOTE — Assessment & Plan Note (Signed)
Chronic, stable. BP 111/72. Continue taking 12.5 mg Metoprolol and Valsartan 40mg  daily. Continue checking BP at home and bring log in at next visit. Recommend staying hydrated with 90 oz water daily.

## 2023-03-04 NOTE — Assessment & Plan Note (Signed)
Chronic, uncontrolled. A1c down from 11% to 7%. Continue taking Metformin 500mg  BID and Trulicity 3 mg. Sugars improved ranging between 97-120. Will consider increasing Trulicity to 4.5mg  if A1c remains greater than 7 at one month follow up.

## 2023-03-04 NOTE — Assessment & Plan Note (Addendum)
Chronic, ongoing. Start Amitriptyline 10 MG daily for maintenace, Flexeril 5 MG given. Return in 1 month. Call sooner if concerns arise.

## 2023-03-04 NOTE — Assessment & Plan Note (Signed)
Chronic, stable. Continue taking Crestor 10mg  daily. Lipid panel stable. Return in 6 months. Call sooner if concerns arise.

## 2023-03-07 ENCOUNTER — Other Ambulatory Visit: Payer: Self-pay | Admitting: Nurse Practitioner

## 2023-03-08 NOTE — Telephone Encounter (Signed)
Requested Prescriptions  Pending Prescriptions Disp Refills   valsartan (DIOVAN) 40 MG tablet [Pharmacy Med Name: Valsartan 40 MG Oral Tablet] 90 tablet 0    Sig: Take 1 tablet by mouth once daily     Cardiovascular:  Angiotensin Receptor Blockers Passed - 03/07/2023  6:50 AM      Passed - Cr in normal range and within 180 days    Creatinine, Ser  Date Value Ref Range Status  01/27/2023 1.05 0.76 - 1.27 mg/dL Final         Passed - K in normal range and within 180 days    Potassium  Date Value Ref Range Status  01/27/2023 4.7 3.5 - 5.2 mmol/L Final         Passed - Patient is not pregnant      Passed - Last BP in normal range    BP Readings from Last 1 Encounters:  02/28/23 111/72         Passed - Valid encounter within last 6 months    Recent Outpatient Visits           1 week ago Need for shingles vaccine   [place] [facility] [place], Sherran Needs, NP   1 week ago COVID-19   [place] [facility] Mecum, Oswaldo Conroy, PA-C   1 month ago Uncontrolled type 2 diabetes mellitus with hyperglycemia [facility])   [place] [facility] [place], Sherran Needs, NP   2 months ago Uncontrolled type 2 diabetes mellitus with hyperglycemia [facility])   [place] [facility] Larae Grooms, NP   3 months ago Uncontrolled type 2 diabetes mellitus with hyperglycemia [facility]   [place] [facility] Larae Grooms, NP       Future Appointments             In 3 weeks Mecum, Oswaldo Conroy, PA-C [place] [facility] [place], [facility]   In 1 month Pearley, Sherran Needs, NP  [facility], [facility]

## 2023-03-13 ENCOUNTER — Other Ambulatory Visit: Payer: Self-pay | Admitting: Nurse Practitioner

## 2023-03-13 DIAGNOSIS — E1165 Type 2 diabetes mellitus with hyperglycemia: Secondary | ICD-10-CM

## 2023-03-15 DIAGNOSIS — G4719 Other hypersomnia: Secondary | ICD-10-CM | POA: Diagnosis not present

## 2023-03-15 DIAGNOSIS — G4733 Obstructive sleep apnea (adult) (pediatric): Secondary | ICD-10-CM | POA: Diagnosis not present

## 2023-03-15 NOTE — Telephone Encounter (Signed)
Requested Prescriptions  Refused Prescriptions Disp Refills   TRULICITY 3 MG/0.5ML SOPN [Pharmacy Med Name: Trulicity 3 MG/0.5ML Subcutaneous Solution Pen-injector] 4 mL 0    Sig: INJECT 3MG  SUBCUTANEOUSLY ONCE A WEEK AS DIRECTED     Endocrinology:  Diabetes - GLP-1 Receptor Agonists Passed - 03/13/2023  7:51 AM      Passed - HBA1C is between 0 and 7.9 and within 180 days    HB A1C (BAYER DCA - WAIVED)  Date Value Ref Range Status  01/27/2023 7.0 (H) 4.8 - 5.6 % Final    Comment:             Prediabetes: 5.7 - 6.4          Diabetes: >6.4          Glycemic control for adults with diabetes: <7.0          Passed - Valid encounter within last 6 months    Recent Outpatient Visits           2 weeks ago Need for shingles vaccine   [place] [facility] Pear[place], Sherran Needs, NP   2 weeks ago COVID-19   [place] [facility] Mecum, Erin E, PA-C   1 month ago Uncontrolled type 2 diabetes mellitus with hyperglycemia [facility])   [place] [facility] [place], Sherran Needs, NP   2 months ago Uncontrolled type 2 diabetes mellitus with hyperglycemia [facility])   [place] [facility] Larae Grooms, NP   3 months ago Uncontrolled type 2 diabetes mellitus with hyperglycemia [facility])   [place] [facility] Larae Grooms, NP       Future Appointments             In 2 weeks Mecum, Oswaldo Conroy, PA-C [place] [facility], [facility]   In 1 month Pearley, Sherran Needs, NP  [facility], [facility]

## 2023-03-22 ENCOUNTER — Other Ambulatory Visit: Payer: Self-pay | Admitting: Family Medicine

## 2023-03-22 DIAGNOSIS — E1165 Type 2 diabetes mellitus with hyperglycemia: Secondary | ICD-10-CM

## 2023-03-24 NOTE — Telephone Encounter (Signed)
Requested Prescriptions  Refused Prescriptions Disp Refills   TRULICITY 3 MG/0.5ML SOPN [Pharmacy Med Name: Trulicity 3 MG/0.5ML Subcutaneous Solution Pen-injector] 4 mL 0    Sig: INJECT 3MG  ONCE A WEEK AS DIRECTED     Endocrinology:  Diabetes - GLP-1 Receptor Agonists Passed - 03/22/2023  1:42 PM      Passed - HBA1C is between 0 and 7.9 and within 180 days    HB A1C (BAYER DCA - WAIVED)  Date Value Ref Range Status  01/27/2023 7.0 (H) 4.8 - 5.6 % Final    Comment:             Prediabetes: 5.7 - 6.4          Diabetes: >6.4          Glycemic control for adults with diabetes: <7.0          Passed - Valid encounter within last 6 months    Recent Outpatient Visits           3 weeks ago Need for shingles vaccine   [place] [facility] [place], Sherran Needs, NP   4 weeks ago COVID-19   [place] [facility] Mecum, Erin E, PA-C   1 month ago Uncontrolled type 2 diabetes mellitus with hyperglycemia [facility] [place])   [place] [facility] [place], Sherran Needs, NP   2 months ago Uncontrolled type 2 diabetes mellitus with hyperglycemia [facility])   [place] [facility] Larae Grooms, NP   3 months ago Uncontrolled type 2 diabetes mellitus with hyperglycemia [facility])   [place] [facility] Lar[facility] Grooms, NP       Future Appointments             In 1 week Mecum, Oswaldo Conroy, PA-C [place] [facility], [facility]   In 1 month Pearley, Sherran Needs, NP [place] [facility], [facility]

## 2023-03-30 ENCOUNTER — Other Ambulatory Visit: Payer: Self-pay | Admitting: Family Medicine

## 2023-03-31 ENCOUNTER — Ambulatory Visit: Payer: 59 | Admitting: Physician Assistant

## 2023-03-31 ENCOUNTER — Encounter: Payer: Self-pay | Admitting: Physician Assistant

## 2023-03-31 VITALS — BP 106/74 | HR 83 | Ht 68.0 in | Wt 191.4 lb

## 2023-03-31 DIAGNOSIS — G43109 Migraine with aura, not intractable, without status migrainosus: Secondary | ICD-10-CM

## 2023-03-31 DIAGNOSIS — Z7984 Long term (current) use of oral hypoglycemic drugs: Secondary | ICD-10-CM | POA: Diagnosis not present

## 2023-03-31 DIAGNOSIS — E1159 Type 2 diabetes mellitus with other circulatory complications: Secondary | ICD-10-CM

## 2023-03-31 DIAGNOSIS — I152 Hypertension secondary to endocrine disorders: Secondary | ICD-10-CM

## 2023-03-31 MED ORDER — RIZATRIPTAN BENZOATE 10 MG PO TBDP
10.0000 mg | ORAL_TABLET | ORAL | 0 refills | Status: DC | PRN
Start: 2023-03-31 — End: 2023-05-29

## 2023-03-31 MED ORDER — ROSUVASTATIN CALCIUM 10 MG PO TABS: 10.0000 mg | ORAL_TABLET | Freq: Every day | ORAL | 0 refills | Status: DC

## 2023-03-31 NOTE — Assessment & Plan Note (Signed)
Chronic, historic condition Appears to be improving with addition of Amitriptyline 10 mg PO every day - states he is still having some blurry vision when he wakes up intermittently but this is not daily like it was at previous visit Will provide refill of Maxalt 10 mg disintegrating tablets per request to assist with resistant headaches  Recommend prompt follow up for any changes or progressing symptoms  Follow up at upcoming apt in Oct

## 2023-03-31 NOTE — Patient Instructions (Signed)
I think we can try to wean you off the Metoprolol at this time  You can take Metoprolol 12.5 mg every other day for one week then try taking every 2 days for about a week.  If you are not having any side effects or symptoms, you can stop completely  Make sure  you are checking you BP at home every day for monitoring  If you notice it is increasing or if you start to have concerns, please call the office

## 2023-03-31 NOTE — Assessment & Plan Note (Addendum)
Chronic, appears stable and controlled today Patient is concerned for dizziness secondary to low BP readings at home  Will try to wean off and dc Metoprolol 12.5 mg PO every day - recommend taking 1 tablet every other day for one week then every 2 days for one week can stop after this Continue Valsartan 40 mg PO every day  Continue checking BP daily for monitoring  Recommend he continues with hydration efforts Pt has follow up in Oct- can discuss response at this apt

## 2023-03-31 NOTE — Telephone Encounter (Signed)
Requested medication (s) are due for refill today: Yes  Requested medication (s) are on the active medication list: Yes  Last refill:  11/02/22 #12, 1RF  Future visit scheduled: Yes  Notes to clinic:  Manual Review required      Requested Prescriptions  Pending Prescriptions Disp Refills   Vitamin D, Ergocalciferol, (DRISDOL) 1.25 MG (50000 UNIT) CAPS capsule [Pharmacy Med Name: Vitamin D (Ergocalciferol) 1.25 MG (50000 UT) Oral Capsule] 12 capsule 0    Sig: TAKE ONE CAPSULE BY MOUTH EVERY 7 DAYS     Endocrinology:  Vitamins - Vitamin D Supplementation 2 Failed - 03/30/2023  6:50 AM      Failed - Manual Review: Route requests for 50,000 IU strength to the provider      Failed - Ca in normal range and within 360 days    Calcium  Date Value Ref Range Status  01/27/2023 8.6 (L) 8.7 - 10.2 mg/dL Final         Passed - Vitamin D in normal range and within 360 days    Vitamin D2 1, 25 (OH)2  Date Value Ref Range Status  04/28/2022 <10 pg/mL Final    Comment:    (NOTE) This test was developed and its performance characteristics determined by [facility]. It has not been cleared or approved by the Food and Drug Administration.    Vitamin D3 1, 25 (OH)2  Date Value Ref Range Status  04/28/2022 60 pg/mL Final    Comment:    (NOTE) This test was developed and its performance characteristics determined by [facility]. It has not been cleared or approved by the Food and Drug Administration. Performed At: [facility] [address] [place], [place] [id number] Lebron Conners F MD [id number]    Vitamin D 1, 25 (OH)2 Total  Date Value Ref Range Status  04/28/2022 63 pg/mL Final    Comment:    (NOTE) Reference Range: Adults: 21 - 65    Vit D, 25-Hydroxy  Date Value Ref Range Status  01/27/2023 32.7 30.0 - 100.0 ng/mL Final    Comment:    Vitamin D deficiency has been defined by the Institute of Medicine and an Endocrine Society practice guideline as a level of  serum 25-OH vitamin D less than 20 ng/mL (1,2). The Endocrine Society went on to further define vitamin D insufficiency as a level between 21 and 29 ng/mL (2). 1. IOM (Institute of Medicine). 2010. Dietary reference    intakes for calcium and D. [place]: The    [employer]. 2. Holick MF, [place], Bischoff-Ferrari HA, et al.    Evaluation, treatment, and prevention of vitamin D    deficiency: an Endocrine Society clinical practice    guideline. JCEM. 2011 Jul; 96(7):1911-30.          Passed - Valid encounter within last 12 months    Recent Outpatient Visits           Today Hypertension associated with diabetes (HCC)   [place] [facility] Mecum, Erin E, PA-C   1 month ago Need for shingles vaccine   [place] [facility] [place], Sherran Needs, NP   1 month ago COVID-19   [facility] Mecum, Oswaldo Conroy, PA-C   2 months ago Uncontrolled type 2 diabetes mellitus with hyperglycemia [facility])   [place] [facility], Sherran Needs, NP   3 months ago Uncontrolled type 2 diabetes mellitus with hyperglycemia (HCC)   [facility] Larae Grooms, NP       Future Appointments             In 1 month Pearley, Sherran Needs, NP [place] [facility], [facility]

## 2023-03-31 NOTE — Progress Notes (Signed)
Established Patient Office Visit  Name: Keith Barber   MRN: [id number]    DOB: Mar 31, 1971   Date:03/31/2023  Today's Provider: Jacquelin Hawking, MHS, PA-C Introduced myself to the patient as a PA-C and provided education on APPs in clinical practice.         Subjective  Chief Complaint  Chief Complaint  Patient presents with   Headache    Patient is here for follow up on Headache. Patient says things are going better.    Hypertension    Patient says he would like to discuss today with provider if he needs to continue with two blood pressure medications.     HPI  Hypertension: - Medications: metoprolol 12.5 mg PO every day, Valsartan 40 mg PO every day  - Compliance: good  - Checking BP at home: Checking BP daily in the AM  Usually running 120s/80s  He denies drops during his plasma donation sessions He is drinking about 2-3 bottles of water per day (16-20 oz size)  He states when he has gotten dizzy at home he has checked his BP and it has been <100/ <70  He thinks he may be overcorrected on his BP medication    Migraines  He reports they are a bit better  States he used to have aura when he was younger but in the last 5 weeks he was waking up with blurry vision  He states this has resolved since he was started on Amitriptyline - may have some rare mornings with blurry vision but it improves He reports some dizziness with blurry vision but this resolves pretty quickly  States the blurry vision seems to isolated to one eye at a time but does switch between them Thinks he has isolated a trigger - sunlight glare from driving seems to trigger  He thinks current dose of Amitriptyline     Patient Active Problem List   Diagnosis Date Noted   Ocular migraine with status migrainosus 03/04/2023   Vitamin D deficiency 11/02/2022   History of colonic polyps 08/30/2022   Erythrocytosis 05/07/2022   Hand pain, left 07/09/2020   Lateral epicondylitis, left elbow  07/09/2020   Radial nerve compression, left 07/09/2020   Traumatic ecchymosis of left hand 07/09/2020   Rupture of left long head biceps tendon 01/14/2020   Uncontrolled type 2 diabetes mellitus with hyperglycemia (HCC) 04/18/2018   Benign neoplasm of rectosigmoid junction    Internal hemorrhoids    Residual hemorrhoidal skin tags    Diverticulosis of large intestine without diverticulitis    Stomach irritation    GERD (gastroesophageal reflux disease)    History of diverticulitis 12/26/2017   Onychomycosis 12/26/2017   Adhesive capsulitis of left shoulder 12/15/2017   Impingement syndrome of left shoulder 07/05/2017   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   Umbilical hernia without obstruction and without gangrene    Bilateral inguinal hernia without obstruction or gangrene    History of tobacco abuse 11/26/2016   Hypertension associated with diabetes (HCC)    Hyperlipidemia associated with type 2 diabetes mellitus (HCC)    Migraines    Rotator cuff tendinitis, left 11/12/2015   Labral tear of shoulder, left, initial encounter 11/12/2015    Past Surgical History:  Procedure Laterality Date   93 HOUR PH STUDY N/A 05/30/2018   Procedure: 24 HOUR PH STUDY;  Surgeon: Pasty Spillers, MD;  Location: [facility] END[facility]COPY;  Service: Gastroenterology;  Laterality: N/A;   bicep  tendon tear      COLONOSCOPY WITH PROPOFOL N/A 02/01/2018   Procedure: COLONOSCOPY WITH PROPOFOL;  Surgeon: Pasty Spillers, MD;  Location: [facility] ENDOSCOPY;  Service: Endoscopy;  Laterality: N/A;   COLONOSCOPY WITH PROPOFOL N/A 08/30/2022   Procedure: COLONOSCOPY WITH PROPOFOL;  Surgeon: Wyline Mood, MD;  Location: [facility] ENDOSCOPY;  Service: Gastroenterology;  Laterality: N/A;   ESOPHAGEAL MANOMETRY N/A 05/30/2018   Procedure: ESOPHAGEAL MANOMETRY (EM);  Surgeon: Pasty Spillers, MD;  Location: [facility] ENDOSCOPY;  Service: Gastroenterology;  Laterality: N/A;   ESOPHAGOGASTRODUODENOSCOPY  (EGD) WITH PROPOFOL N/A 02/01/2018   Procedure: ESOPHAGOGASTRODUODENOSCOPY (EGD) WITH PROPOFOL;  Surgeon: Pasty Spillers, MD;  Location: [facility] ENDOSCOPY;  Service: Endoscopy;  Laterality: N/A;   INGUINAL HERNIA REPAIR Bilateral 01/21/2017   Procedure: LAPAROSCOPIC BILATERAL INGUINAL HERNIA REPAIR;  Surgeon: Leafy Ro, MD;  Location: [facility] ORS;  Service: General;  Laterality: Bilateral;   KNEE SURGERY Left    SHOULDER ARTHROSCOPY WITH DISTAL CLAVICLE RESECTION Left 07/05/2017   Procedure: LEFT SHOULDER ARTHROSCOPY, SUBACROMIAL DECOMPRESSION, DISTAL CLAVICLE RESECTION, POSSIBLE MINI OPEN ROTATOR CUFF REPAIR;  Surgeon: Valeria Batman, MD;  Location: [facility];  Service: Orthopedics;  Laterality: Left;   SHOULDER CLOSED REDUCTION Left 12/13/2017   Procedure: LEFT SHOULDER MANIPULATION with steroid injection;  Surgeon: Valeria Batman, MD;  Location: [facility] OR;  Service: Orthopedics;  Laterality: Left;   SHOULDER SURGERY Right    UMBILICAL HERNIA REPAIR N/A 01/21/2017   Procedure: HERNIA REPAIR UMBILICAL ADULT;  Surgeon: Leafy Ro, MD;  Location: [facility] ORS;  Service: General;  Laterality: N/A;    Family History  Problem Relation Age of Onset   Diabetes Mother    Migraines Mother    Lung cancer Father    Lung cancer Paternal Grandfather    Cancer Neg Hx    COPD Neg Hx    Heart disease Neg Hx    Stroke Neg Hx    Sleep apnea Neg Hx     Social History   Tobacco Use   Smoking status: Former    Current packs/day: 0.00    Average packs/day: 0.5 packs/day for 26.0 years (13.0 ttl pk-yrs)    Types: Cigarettes    Start date: 01/14/1991    Quit date: 01/13/2017    Years since quitting: 6.2   Smokeless tobacco: Never  Substance Use Topics   Alcohol use: No     Current Outpatient Medications:    Accu-Chek Softclix Lancets lancets, 1 each by Other route in the morning and at bedtime. Use as instructed, Disp: 100 each, Rfl: 6   amitriptyline (ELAVIL) 10 MG tablet, Take 1 tablet (10  mg total) by mouth at bedtime., Disp: 30 tablet, Rfl: 1   Blood Glucose Monitoring Suppl (ACCU-CHEK AVIVA PLUS) w/Device KIT, 1 each by Does not apply route in the morning and at bedtime., Disp: 1 kit, Rfl: 0   cyclobenzaprine (FLEXERIL) 5 MG tablet, Take 1 tablet (5 mg total) by mouth daily as needed for muscle spasms., Disp: 30 tablet, Rfl: 1   Dulaglutide (TRULICITY) 3 MG/0.5ML SOPN, Inject 3 mg as directed once a week., Disp: 3 mL, Rfl: 1   glucose blood (ACCU-CHEK AVIVA PLUS) test strip, 1 each by Other route in the morning and at bedtime. Use as instructed, Disp: 100 each, Rfl: 6   metFORMIN (GLUCOPHAGE) 500 MG tablet, Take 1 tablet (500 mg total) by mouth 2 (two) times daily with a meal., Disp: 180 tablet, Rfl: 1   metoprolol succinate (TOPROL-XL) 25 MG  24 hr tablet, Take 1 tablet by mouth once daily (Patient taking differently: Take 25 mg by mouth daily. Takes 1/2 tablet daily), Disp: 90 tablet, Rfl: 1   naproxen sodium (ALEVE) 220 MG tablet, Take 2 tablets by mouth daily., Disp: , Rfl:    rizatriptan (MAXALT-MLT) 10 MG disintegrating tablet, Take 1 tablet (10 mg total) by mouth as needed for migraine. May repeat in 2 hours if needed, Disp: 10 tablet, Rfl: 0   valsartan (DIOVAN) 40 MG tablet, Take 1 tablet by mouth once daily, Disp: 90 tablet, Rfl: 0   Vitamin D, Ergocalciferol, (DRISDOL) 1.25 MG (50000 UNIT) CAPS capsule, Take 1 capsule (50,000 Units total) by mouth every 7 (seven) days., Disp: 12 capsule, Rfl: 1   rosuvastatin (CRESTOR) 10 MG tablet, Take 1 tablet (10 mg total) by mouth daily., Disp: 90 tablet, Rfl: 0  Allergies  Allergen Reactions   Amlodipine Itching    I personally reviewed active problem list, medication list, allergies, health maintenance, notes from last encounter, lab results with the patient/caregiver today.   Review of Systems  Eyes:  Positive for blurred vision and photophobia.  Neurological:  Positive for dizziness and headaches. Negative for tingling.       Objective  Vitals:   03/31/23 0920  BP: 106/74  Pulse: 83  SpO2: 97%  Weight: 191 lb 6.4 oz (86.8 kg)  Height: 5\' 8"  (1.727 m)    Body mass index is 29.1 kg/m.  Physical Exam Vitals reviewed.  Constitutional:      General: He is awake.     Appearance: Normal appearance. He is well-developed and well-groomed.  HENT:     Head: Normocephalic and atraumatic.  Pulmonary:     Effort: Pulmonary effort is normal.  Musculoskeletal:     Cervical back: Normal range of motion.  Neurological:     General: No focal deficit present.     Mental Status: He is alert and oriented to person, place, and time.     GCS: GCS eye subscore is 4. GCS verbal subscore is 5. GCS motor subscore is 6.     Cranial Nerves: No cranial nerve deficit, dysarthria or facial asymmetry.     Gait: Gait is intact.  Psychiatric:        Attention and Perception: Attention and perception normal.        Mood and Affect: Mood and affect normal.        Speech: Speech normal.        Behavior: Behavior normal. Behavior is cooperative.      Recent Results (from the past 2160 hour(s))  Bayer DCA Hb A1c Waived     Status: Abnormal   Collection Time: 01/27/23  9:35 AM  Result Value Ref Range   HB A1C (BAYER DCA - WAIVED) 7.0 (H) 4.8 - 5.6 %    Comment:          Prediabetes: 5.7 - 6.4          Diabetes: >6.4          Glycemic control for adults with diabetes: <7.0   Lipid Panel w/o Chol/HDL Ratio     Status: Abnormal   Collection Time: 01/27/23  9:37 AM  Result Value Ref Range   Cholesterol, Total 125 100 - 199 mg/dL   Triglycerides 644 (H) 0 - 149 mg/dL   HDL 37 (L) >03 mg/dL   VLDL Cholesterol Cal 30 5 - 40 mg/dL   LDL Chol Calc (NIH) 58 0 - 99 mg/dL  Comp Met (CMET)     Status: Abnormal   Collection Time: 01/27/23  9:37 AM  Result Value Ref Range   Glucose 111 (H) 70 - 99 mg/dL   BUN 14 6 - 24 mg/dL   Creatinine, Ser 6.96 0.76 - 1.27 mg/dL   eGFR 85 >29 BM/WUX/3.24   BUN/Creatinine Ratio 13 9 -  20   Sodium 140 134 - 144 mmol/L   Potassium 4.7 3.5 - 5.2 mmol/L   Chloride 107 (H) 96 - 106 mmol/L   CO2 20 20 - 29 mmol/L   Calcium 8.6 (L) 8.7 - 10.2 mg/dL   Total Protein 6.3 6.0 - 8.5 g/dL   Albumin 4.4 3.8 - 4.9 g/dL   Globulin, Total 1.9 1.5 - 4.5 g/dL   Bilirubin Total 0.9 0.0 - 1.2 mg/dL   Alkaline Phosphatase 91 44 - 121 IU/L   AST 30 0 - 40 IU/L   ALT 27 0 - 44 IU/L  Vitamin D (25 hydroxy)     Status: None   Collection Time: 01/27/23  9:37 AM  Result Value Ref Range   Vit D, 25-Hydroxy 32.7 30.0 - 100.0 ng/mL    Comment: Vitamin D deficiency has been defined by the Institute of Medicine and an Endocrine Society practice guideline as a level of serum 25-OH vitamin D less than 20 ng/mL (1,2). The Endocrine Society went on to further define vitamin D insufficiency as a level between 21 and 29 ng/mL (2). 1. IOM (Institute of Medicine). 2010. Dietary reference    intakes for calcium and D. [place]: The    [employer]. 2. Holick MF, [place], Bischoff-Ferrari HA, et al.    Evaluation, treatment, and prevention of vitamin D    deficiency: an Endocrine Society clinical practice    guideline. JCEM. 2011 Jul; 96(7):1911-30.   CBC with Differential/Platelet     Status: None   Collection Time: 02/21/23  1:39 PM  Result Value Ref Range   WBC 8.8 4.0 - 10.5 K/uL   RBC 5.63 4.22 - 5.81 MIL/uL   Hemoglobin 16.6 13.0 - 17.0 g/dL   HCT 40.1 02.7 - 25.3 %   MCV 86.3 80.0 - 100.0 fL   MCH 29.5 26.0 - 34.0 pg   MCHC 34.2 30.0 - 36.0 g/dL   RDW 66.4 40.3 - 47.4 %   Platelets 209 150 - 400 K/uL   nRBC 0.0 0.0 - 0.2 %   Neutrophils Relative % 62 %   Neutro Abs 5.6 1.7 - 7.7 K/uL   Lymphocytes Relative 25 %   Lymphs Abs 2.2 0.7 - 4.0 K/uL   Monocytes Relative 8 %   Monocytes Absolute 0.7 0.1 - 1.0 K/uL   Eosinophils Relative 3 %   Eosinophils Absolute 0.2 0.0 - 0.5 K/uL   Basophils Relative 1 %   Basophils Absolute 0.0 0.0 - 0.1 K/uL   Immature  Granulocytes 1 %   Abs Immature Granulocytes 0.05 0.00 - 0.07 K/uL    Comment: Performed at [facility], [address] [place], [place] [address]     PHQ2/9:    04/23/2022    8:38 AM 10/15/2021   10:34 AM 07/16/2021    8:11 AM 09/17/2020    9:01 AM 09/05/2019   10:10 AM  Depression screen PHQ 2/9  Decreased Interest 0 0 0 1 0  Down, Depressed, Hopeless 0 0 0 1 0  PHQ - 2 Score 0 0 0 2 0  Altered sleeping 3 0  3 1 1   Tired, decreased energy 2 0 1 2 1   Change in appetite 0 0 0 0 0  Feeling bad or failure about yourself  0 0 0 0 0  Trouble concentrating 0 0 0 0 0  Moving slowly or fidgety/restless 0 0 0 0 0  Suicidal thoughts 0 0 0 0 0  PHQ-9 Score 5 0 4 5 2   Difficult doing work/chores Not difficult at all  Not difficult at all Not difficult at all       Fall Risk:    08/02/2022    3:57 PM 04/23/2022    8:38 AM 02/17/2022    2:52 PM 07/16/2021    8:11 AM 09/17/2020    9:01 AM  Fall Risk   Falls in the past year? 0 0 0 0 1  Number falls in past yr:  0 0 0 0  Injury with Fall?  0 0 0 0  Risk for fall due to :  No Fall Risks No Fall Risks No Fall Risks   Follow up  Falls evaluation completed Falls evaluation completed Falls evaluation completed       Functional Status Survey:      Assessment & Plan  Problem List Items Addressed This Visit       Cardiovascular and Mediastinum   Hypertension associated with diabetes (HCC) - Primary    Chronic, appears stable and controlled today Patient is concerned for dizziness secondary to low BP readings at home  Will try to wean off and dc Metoprolol 12.5 mg PO every day - recommend taking 1 tablet every other day for one week then every 2 days for one week can stop after this Continue Valsartan 40 mg PO every day  Continue checking BP daily for monitoring  Recommend he continues with hydration efforts Pt has follow up in Oct- can discuss response at this apt        Relevant Medications   rosuvastatin (CRESTOR) 10 MG  tablet   Migraines    Chronic, historic condition Appears to be improving with addition of Amitriptyline 10 mg PO every day - states he is still having some blurry vision when he wakes up intermittently but this is not daily like it was at previous visit Will provide refill of Maxalt 10 mg disintegrating tablets per request to assist with resistant headaches  Recommend prompt follow up for any changes or progressing symptoms  Follow up at upcoming apt in Oct       Relevant Medications   rizatriptan (MAXALT-MLT) 10 MG disintegrating tablet   rosuvastatin (CRESTOR) 10 MG tablet     No follow-ups on file.   I, Donnald Tabar E Richad Ramsay, PA-C, have reviewed all documentation for this visit. The documentation on 03/31/23 for the exam, diagnosis, procedures, and orders are all accurate and complete.   Jacquelin Hawking, MHS, PA-C [facility]

## 2023-04-09 ENCOUNTER — Other Ambulatory Visit: Payer: Self-pay | Admitting: Nurse Practitioner

## 2023-04-11 NOTE — Telephone Encounter (Signed)
Requested Prescriptions  Pending Prescriptions Disp Refills   metoprolol succinate (TOPROL-XL) 25 MG 24 hr tablet [Pharmacy Med Name: Metoprolol Succinate ER 25 MG Oral Tablet Extended Release 24 Hour] 90 tablet 0    Sig: Take 1 tablet by mouth once daily     Cardiovascular:  Beta Blockers Passed - 04/09/2023  6:51 AM      Passed - Last BP in normal range    BP Readings from Last 1 Encounters:  03/31/23 106/74         Passed - Last Heart Rate in normal range    Pulse Readings from Last 1 Encounters:  03/31/23 83         Passed - Valid encounter within last 6 months    Recent Outpatient Visits           1 week ago Hypertension associated with diabetes (HCC)   [place] [facility] Mecum, Oswaldo Conroy, PA-C   1 month ago Need for shingles vaccine   [place] [facility] [place], Sherran Needs, NP   1 month ago COVID-19   [place] [facility] Mecum, Oswaldo Conroy, PA-C   2 months ago Uncontrolled type 2 diabetes mellitus with hyperglycemia [facility])   [place] [facility] [place], Sherran Needs, NP   3 months ago Uncontrolled type 2 diabetes mellitus with hyperglycemia [facility])   [place] [facility] Larae Grooms, NP       Future Appointments             In 3 weeks Pear[place], Sherran Needs, NP  [place] [facility]

## 2023-04-15 DIAGNOSIS — G4719 Other hypersomnia: Secondary | ICD-10-CM | POA: Diagnosis not present

## 2023-04-15 DIAGNOSIS — G4733 Obstructive sleep apnea (adult) (pediatric): Secondary | ICD-10-CM | POA: Diagnosis not present

## 2023-04-16 ENCOUNTER — Other Ambulatory Visit: Payer: Self-pay | Admitting: Family Medicine

## 2023-04-18 NOTE — Telephone Encounter (Signed)
Requested Prescriptions  Pending Prescriptions Disp Refills   metFORMIN (GLUCOPHAGE) 500 MG tablet [Pharmacy Med Name: metFORMIN HCl 500 MG Oral Tablet] 360 tablet 0    Sig: TAKE 2 TABLETS BY MOUTH TWICE DAILY WITH MEALS     Endocrinology:  Diabetes - Biguanides Failed - 04/16/2023  6:51 AM      Failed - B12 Level in normal range and within 720 days    No results found for: "VITAMINB12"       Passed - Cr in normal range and within 360 days    Creatinine, Ser  Date Value Ref Range Status  01/27/2023 1.05 0.76 - 1.27 mg/dL Final         Passed - HBA1C is between 0 and 7.9 and within 180 days    HB A1C (BAYER DCA - WAIVED)  Date Value Ref Range Status  01/27/2023 7.0 (H) 4.8 - 5.6 % Final    Comment:             Prediabetes: 5.7 - 6.4          Diabetes: >6.4          Glycemic control for adults with diabetes: <7.0          Passed - eGFR in normal range and within 360 days    GFR calc Af Amer  Date Value Ref Range Status  02/05/2020 >60 >60 mL/min Final   GFR, Estimated  Date Value Ref Range Status  11/01/2022 >60 >60 mL/min Final    Comment:    (NOTE) Calculated using the CKD-EPI Creatinine Equation (2021)    eGFR  Date Value Ref Range Status  01/27/2023 85 >59 mL/min/1.73 Final         Passed - Valid encounter within last 6 months    Recent Outpatient Visits           2 weeks ago Hypertension associated with diabetes (HCC)   [place] [facility] Mecum, Oswaldo Conroy, PA-C   1 month ago Need for shingles vaccine   [place] [facility] Pearley, Sherran Needs, NP   1 month ago COVID-19   [place] [facility] Mecum, Erin E, PA-C   2 months ago Uncontrolled type 2 diabetes mellitus with hyperglycemia (HCC)   [place] [facility] [place], Sherran Needs, NP   3 months ago Uncontrolled type 2 diabetes mellitus with hyperglycemia [facility])   [place] [facility] Larae Grooms, NP        Future Appointments             In 2 weeks Pearley, Sherran Needs, NP [place] [facility]            Passed - CBC within normal limits and completed in the last 12 months    WBC  Date Value Ref Range Status  02/21/2023 8.8 4.0 - 10.5 K/uL Final   RBC  Date Value Ref Range Status  02/21/2023 5.63 4.22 - 5.81 MIL/uL Final   Hemoglobin  Date Value Ref Range Status  02/21/2023 16.6 13.0 - 17.0 g/dL Final  16/04/9603 54.0 13.0 - 17.7 g/dL Final   HCT  Date Value Ref Range Status  02/21/2023 48.6 39.0 - 52.0 % Final   Hematocrit  Date Value Ref Range Status  10/27/2017 51.0 37.5 - 51.0 % Final   MCHC  Date Value Ref Range Status  02/21/2023 34.2 30.0 - 36.0 g/dL Final   [facility]  Date Value Ref  Range Status  02/21/2023 29.5 26.0 - 34.0 pg Final   MCV  Date Value Ref Range Status  02/21/2023 86.3 80.0 - 100.0 fL Final  10/27/2017 90 79 - 97 fL Final   No results found for: "PLTCOUNTKUC", "LABPLAT", "POCPLA" RDW  Date Value Ref Range Status  02/21/2023 12.8 11.5 - 15.5 % Final  10/27/2017 12.8 12.3 - 15.4 % Final

## 2023-04-27 ENCOUNTER — Encounter: Payer: Self-pay | Admitting: Oncology

## 2023-05-02 ENCOUNTER — Ambulatory Visit: Payer: Self-pay | Admitting: Family Medicine

## 2023-05-11 ENCOUNTER — Telehealth: Payer: Self-pay | Admitting: Neurology

## 2023-05-11 NOTE — Telephone Encounter (Signed)
Pt said cancelling the appt due to not using the CPAP machine. Have been waiting on a call from the company and never got one so quick using the machine.

## 2023-05-12 ENCOUNTER — Other Ambulatory Visit: Payer: Self-pay | Admitting: Family Medicine

## 2023-05-12 DIAGNOSIS — E1165 Type 2 diabetes mellitus with hyperglycemia: Secondary | ICD-10-CM

## 2023-05-13 NOTE — Telephone Encounter (Signed)
Requested Prescriptions  Pending Prescriptions Disp Refills   TRULICITY 3 MG/0.5ML SOAJ [Pharmacy Med Name: Trulicity 3 MG/0.5ML Subcutaneous Solution Pen-injector] 4 mL 0    Sig: INJECT 3MG  ONCE A WEEK AS DIRECTED     Endocrinology:  Diabetes - GLP-1 Receptor Agonists Passed - 05/12/2023  9:12 AM      Passed - HBA1C is between 0 and 7.9 and within 180 days    HB A1C (BAYER DCA - WAIVED)  Date Value Ref Range Status  01/27/2023 7.0 (H) 4.8 - 5.6 % Final    Comment:             Prediabetes: 5.7 - 6.4          Diabetes: >6.4          Glycemic control for adults with diabetes: <7.0          Passed - Valid encounter within last 6 months    Recent Outpatient Visits           1 month ago Hypertension associated with diabetes (HCC)   [place] [facility] Mecum, Oswaldo Conroy, PA-C   2 months ago Need for shingles vaccine   [place] [facility] [place], Sherran Needs, NP   2 months ago COVID-19   [place] [facility] Mecum, Erin E, PA-C   3 months ago Uncontrolled type 2 diabetes mellitus with hyperglycemia [name])   [place] [facility] [place], Sherran Needs, NP   4 months ago Uncontrolled type 2 diabetes mellitus with hyperglycemia [facility])   [place] [facility] Larae Grooms, NP

## 2023-05-15 DIAGNOSIS — G4719 Other hypersomnia: Secondary | ICD-10-CM | POA: Diagnosis not present

## 2023-05-15 DIAGNOSIS — G4733 Obstructive sleep apnea (adult) (pediatric): Secondary | ICD-10-CM | POA: Diagnosis not present

## 2023-05-25 DIAGNOSIS — M5441 Lumbago with sciatica, right side: Secondary | ICD-10-CM | POA: Diagnosis not present

## 2023-05-25 DIAGNOSIS — M62830 Muscle spasm of back: Secondary | ICD-10-CM | POA: Diagnosis not present

## 2023-05-25 DIAGNOSIS — M5442 Lumbago with sciatica, left side: Secondary | ICD-10-CM | POA: Diagnosis not present

## 2023-05-25 DIAGNOSIS — M9903 Segmental and somatic dysfunction of lumbar region: Secondary | ICD-10-CM | POA: Diagnosis not present

## 2023-05-29 ENCOUNTER — Ambulatory Visit
Admission: EM | Admit: 2023-05-29 | Discharge: 2023-05-29 | Disposition: A | Discharge status: Home / Self Care | Payer: 59 | Attending: Emergency Medicine | Admitting: Emergency Medicine

## 2023-05-29 DIAGNOSIS — Z760 Encounter for issue of repeat prescription: Secondary | ICD-10-CM

## 2023-05-29 DIAGNOSIS — F430 Acute stress reaction: Secondary | ICD-10-CM

## 2023-05-29 DIAGNOSIS — Z8669 Personal history of other diseases of the nervous system and sense organs: Secondary | ICD-10-CM

## 2023-05-29 DIAGNOSIS — M7542 Impingement syndrome of left shoulder: Secondary | ICD-10-CM | POA: Diagnosis not present

## 2023-05-29 DIAGNOSIS — G43109 Migraine with aura, not intractable, without status migrainosus: Secondary | ICD-10-CM

## 2023-05-29 HISTORY — DX: Encounter for issue of repeat prescription: Z76.0

## 2023-05-29 HISTORY — DX: Personal history of other diseases of the nervous system and sense organs: Z86.69

## 2023-05-29 HISTORY — DX: Acute stress reaction: F43.0

## 2023-05-29 MED ORDER — RIZATRIPTAN BENZOATE 10 MG PO TBDP
10.0000 mg | ORAL_TABLET | ORAL | 0 refills | Status: DC | PRN
Start: 2023-05-29 — End: 2023-08-09

## 2023-05-29 NOTE — ED Triage Notes (Signed)
Pt c/o left side shoulder pain, fatigue, migraine x3days  Pt states that on Friday the pain started in the afternoon. Pt began to sweat, have left side shoulder pain, dizziness, fatigue after doing house chores.  Pt states that he donated Plasma on Friday morning  Pt has had continued migraine with blurry vision from Friday to today.   Pt states that he has pain when taking deep breathes and SOB.

## 2023-05-29 NOTE — Discharge Instructions (Addendum)
We have refilled your maxalt, take as prescribed. Drink plenty of water. Avoid stressful situations, avoid plasma donations, avoid straining(reading small print,texting, cell phone/computer use) as it may aggravate your symptoms.   If the maxalt does not help symptoms, or you have new or worsening symptoms(worsening headache, nausea,vomiting, chest pain,palpitations, etc,  go to ER immediately for further evaluation.

## 2023-05-29 NOTE — ED Provider Notes (Addendum)
[facility]    CSN: [id number] Arrival date & time: 05/29/23  1207      History   Chief Complaint Chief Complaint  Patient presents with   Shoulder Pain   Fatigue        Headache    HPI Keith Barber is a 52 y.o. male.   52 year old male patient, Keith Barber, presents to urgent care for evaluation of multiple complaints: Left shoulder pain(hx of shoulder impingement( no new trauma or injury), fatigue, migraine with blurry vision(normal with headaches) after donating plasma on Friday.  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  GCS 15 in office, appears anxious.  The history is provided by the patient and a caregiver. No language interpreter was used.    Past Medical History:  Diagnosis Date   Bone spur    LEFT SHOULDER THAT MAKES HIS LEFT ARM TINGLE - had removed   Diverticulitis large intestine 09/2017   GERD (gastroesophageal reflux disease)    Hemorrhoids    Hyperlipidemia    Hypertension    Migraines    rare migraines   Sleep apnea     Patient Active Problem List   Diagnosis Date Noted   H/O migraine with aura 05/29/2023   Stress reaction 05/29/2023   Encounter for medication refill 05/29/2023   Ocular migraine with status migrainosus 03/04/2023   Vitamin D deficiency 11/02/2022   History of colonic polyps 08/30/2022   Erythrocytosis 05/07/2022   Hand pain, left 07/09/2020   Lateral epicondylitis, left elbow 07/09/2020   Radial nerve compression, left 07/09/2020   Traumatic ecchymosis of left hand 07/09/2020   Rupture of left long head biceps tendon 01/14/2020   Uncontrolled type 2 diabetes mellitus with hyperglycemia (HCC) 04/18/2018   Benign neoplasm of rectosigmoid junction    Internal hemorrhoids    Residual hemorrhoidal skin tags    Diverticulosis of large intestine without diverticulitis    Stomach irritation    GERD (gastroesophageal reflux disease)    History of  diverticulitis 12/26/2017   Onychomycosis 12/26/2017   Adhesive capsulitis of left shoulder 12/15/2017   Impingement syndrome of left shoulder region 07/05/2017   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   Umbilical hernia without obstruction and without gangrene    Bilateral inguinal hernia without obstruction or gangrene    History of tobacco abuse 11/26/2016   Hypertension associated with diabetes (HCC)    Hyperlipidemia associated with type 2 diabetes mellitus (HCC)    Migraines    Rotator cuff tendinitis, left 11/12/2015   Labral tear of shoulder, left, initial encounter 11/12/2015    Past Surgical History:  Procedure Laterality Date   36 HOUR PH STUDY N/A 05/30/2018   Procedure: 24 HOUR PH STUDY;  Surgeon: Pasty Spillers, MD;  Location: [facility];  Service: Gastroenterology;  Laterality: N/A;   bicep tendon tear      COLONOSCOPY WITH PROPOFOL N/A 02/01/2018   Procedure: COLONOSCOPY WITH PROPOFOL;  Surgeon: Pasty Spillers, MD;  Location: [facility];  Service: Endoscopy;  Laterality: N/A;   COLONOSCOPY WITH PROPOFOL N/A 08/30/2022   Procedure: COLONOSCOPY WITH PROPOFOL;  Surgeon: Wyline Mood, MD;  Location: [facility]COPY;  Service: Gastroenterology;  Laterality: N/A;   ESOPHAGEAL MANOMETRY N/A 05/30/2018   Procedure: ESOPHAGEAL MANOMETRY (EM);  Surgeon: Pasty Spillers, MD;  Location: [facility];  Service: Gastroenterology;  Laterality: N/A;   ESOPHAGOGASTRODUODENOSCOPY (EGD) WITH PROPOFOL N/A 02/01/2018  Procedure: ESOPHAGOGASTRODUODENOSCOPY (EGD) WITH PROPOFOL;  Surgeon: Pasty Spillers, MD;  Location: [facility];  Service: Endoscopy;  Laterality: N/A;   INGUINAL HERNIA REPAIR Bilateral 01/21/2017   Procedure: LAPAROSCOPIC BILATERAL INGUINAL HERNIA REPAIR;  Surgeon: Leafy Ro, MD;  Location: [facility];  Service: General;  Laterality: Bilateral;   KNEE SURGERY Left    SHOULDER ARTHROSCOPY WITH DISTAL CLAVICLE  RESECTION Left 07/05/2017   Procedure: LEFT SHOULDER ARTHROSCOPY, SUBACROMIAL DECOMPRESSION, DISTAL CLAVICLE RESECTION, POSSIBLE MINI OPEN ROTATOR CUFF REPAIR;  Surgeon: Valeria Batman, MD;  Location: [facility] OR;  Service: Orthopedics;  Laterality: Left;   SHOULDER CLOSED REDUCTION Left 12/13/2017   Procedure: LEFT SHOULDER MANIPULATION with steroid injection;  Surgeon: Valeria Batman, MD;  Location: [facility] OR;  Service: Orthopedics;  Laterality: Left;   SHOULDER SURGERY Right    UMBILICAL HERNIA REPAIR N/A 01/21/2017   Procedure: HERNIA REPAIR UMBILICAL ADULT;  Surgeon: Leafy Ro, MD;  Location: [facility];  Service: General;  Laterality: N/A;       Home Medications    Prior to Admission medications   Medication Sig Start Date End Date Taking? Authorizing Provider  Accu-Chek Softclix Lancets lancets 1 each by Other route in the morning and at bedtime. Use as instructed 11/18/22  Yes Larae Grooms, NP  Blood Glucose Monitoring Suppl (ACCU-CHEK AVIVA PLUS) w/Device KIT 1 each by Does not apply route in the morning and at bedtime. 11/18/22  Yes Larae Grooms, NP  cyclobenzaprine (FLEXERIL) 5 MG tablet Take 1 tablet (5 mg total) by mouth daily as needed for muscle spasms. 02/28/23  Yes Pearley, Sherran Needs, NP  glucose blood (ACCU-CHEK AVIVA PLUS) test strip 1 each by Other route in the morning and at bedtime. Use as instructed 11/18/22  Yes Larae Grooms, NP  metFORMIN (GLUCOPHAGE) 500 MG tablet TAKE 2 TABLETS BY MOUTH TWICE DAILY WITH MEALS 04/18/23  Yes Larae Grooms, NP  naproxen sodium (ALEVE) 220 MG tablet Take 2 tablets by mouth daily.   Yes [provider]  rosuvastatin (CRESTOR) 10 MG tablet Take 1 tablet (10 mg total) by mouth daily. 03/31/23  Yes Mecum, Erin E, PA-C  TRULICITY 3 MG/0.5ML SOAJ INJECT 3MG  ONCE A WEEK AS DIRECTED 05/13/23  Yes Larae Grooms, NP  valsartan (DIOVAN) 40 MG tablet Take 1 tablet by mouth once daily 03/08/23  Yes Mecum, Erin E, PA-C   Vitamin D, Ergocalciferol, (DRISDOL) 1.25 MG (50000 UNIT) CAPS capsule Take 1 capsule (50,000 Units total) by mouth every 7 (seven) days. 11/02/22  Yes Johnson, Megan P, DO  amitriptyline (ELAVIL) 10 MG tablet Take 1 tablet (10 mg total) by mouth at bedtime. 02/28/23 04/29/23  [name], NP  metoprolol succinate (TOPROL-XL) 25 MG 24 hr tablet Take 1 tablet by mouth once daily 04/11/23   Larae Grooms, NP  rizatriptan (MAXALT-MLT) 10 MG disintegrating tablet Take 1 tablet (10 mg total) by mouth as needed for migraine. May repeat in 2 hours if needed 05/29/23   Richrd Kuzniar, Para March, NP    Family History Family History  Problem Relation Age of Onset   Diabetes Mother    Migraines Mother    Lung cancer Father    Lung cancer Paternal Grandfather    Cancer Neg Hx    COPD Neg Hx    Heart disease Neg Hx    Stroke Neg Hx    Sleep apnea Neg Hx     Social History Social History   Tobacco Use   Smoking status: Former  Current packs/day: 0.00    Average packs/day: 0.5 packs/day for 26.0 years (13.0 ttl pk-yrs)    Types: Cigarettes    Start date: 01/14/1991    Quit date: 01/13/2017    Years since quitting: 6.3   Smokeless tobacco: Never  Vaping Use   Vaping status: Never Used  Substance Use Topics   Alcohol use: No   Drug use: No     Allergies   Amlodipine   Review of Systems Review of Systems  Constitutional:  Negative for fever.  Respiratory:  Positive for shortness of breath.   Cardiovascular:  Negative for chest pain and palpitations.  Musculoskeletal:  Positive for arthralgias.  Neurological:  Positive for dizziness and headaches.  Psychiatric/Behavioral:  The patient is nervous/anxious.   All other systems reviewed and are negative.    Physical Exam Triage Vital Signs ED Triage Vitals  Encounter Vitals Group     BP 05/29/23 1231 123/82     Systolic BP Percentile --      Diastolic BP Percentile --      Pulse Rate 05/29/23 1231 92     Resp --       Temp 05/29/23 1231 97.8 F (36.6 C)     Temp Source 05/29/23 1231 Oral     SpO2 05/29/23 1231 98 %     Weight 05/29/23 1222 195 lb (88.5 kg)     Height 05/29/23 1222 5\' 8"  (1.727 m)     Head Circumference --      Peak Flow --      Pain Score 05/29/23 1221 5     Pain Loc --      Pain Education --      Exclude from Growth Chart --    No data found.  Updated Vital Signs BP 123/82 (BP Location: Left Arm)   Pulse 92   Temp 97.8 F (36.6 C) (Oral)   Ht 5\' 8"  (1.727 m)   Wt 195 lb (88.5 kg)   SpO2 98%   BMI 29.65 kg/m   Visual Acuity Right Eye Distance:   Left Eye Distance:   Bilateral Distance:    Right Eye Near:   Left Eye Near:    Bilateral Near:     Physical Exam Vitals and nursing note reviewed.  Constitutional:      General: He is not in acute distress.    Appearance: He is well-developed.  HENT:     Head: Normocephalic and atraumatic.     Right Ear: Tympanic membrane normal.     Left Ear: Tympanic membrane normal.     Nose: Nose normal.     Mouth/Throat:     Lips: Pink.     Mouth: Mucous membranes are moist.     Pharynx: Oropharynx is clear. Uvula midline.  Eyes:     General: Lids are normal. Vision grossly intact.     Conjunctiva/sclera: Conjunctivae normal.     Pupils: Pupils are equal, round, and reactive to light.  Cardiovascular:     Rate and Rhythm: Normal rate and regular rhythm.     Pulses: Normal pulses.     Heart sounds: Normal heart sounds. No murmur heard. Pulmonary:     Effort: Pulmonary effort is normal. No respiratory distress.     Breath sounds: Normal breath sounds and air entry.  Abdominal:     Palpations: Abdomen is soft.     Tenderness: There is no abdominal tenderness.  Musculoskeletal:        General: No swelling.  Left shoulder: Tenderness present. Normal pulse.     Cervical back: Normal range of motion and neck supple.  Skin:    General: Skin is warm and dry.     Capillary Refill: Capillary refill takes less than 2  seconds.  Neurological:     General: No focal deficit present.     Mental Status: He is alert and oriented to person, place, and time.     GCS: GCS eye subscore is 4. GCS verbal subscore is 5. GCS motor subscore is 6.     Cranial Nerves: No cranial nerve deficit.     Sensory: No sensory deficit.  Psychiatric:        Attention and Perception: Attention normal.        Mood and Affect: Mood is anxious.        Speech: Speech normal.        Behavior: Behavior normal.      UC Treatments / Results  Labs (all labs ordered are listed, but only abnormal results are displayed) Labs Reviewed - No data to display  EKG   Radiology No results found.  Procedures Procedures (including critical care time)  Medications Ordered in UC Medications - No data to display  Initial Impression / Assessment and Plan / UC Course  I have reviewed the triage vital signs and the nursing notes.  Pertinent labs & imaging results that were available during my care of the patient were reviewed by me and considered in my medical decision making (see chart for details).  Clinical Course as of 05/29/23 1601  Sun May 29, 2023  1225 EKG shows NSR rate 95, no ectopy, QTC is 457, no STEMI  [JD]    Clinical Course User Index [JD] Wateen Varon, Para March, NP   Discussed exam findings and plan of care with patient will refill Maxalt and have patient follow-up with PCP.  Discussed strict go to ER precautions if patient has worsening symptoms or no improvement after taking Maxalt.  Patient and support person both verbalized understanding this provider.  Ddx: Impingement syndrome of left shoulder, history of migraine with aura, stress reaction, anxiety , encounter for medication refill Final Clinical Impressions(s) / UC Diagnoses   Final diagnoses:  Impingement syndrome of left shoulder region  H/O migraine with aura  Stress reaction  Encounter for medication refill     Discharge Instructions      We have  refilled your maxalt, take as prescribed. Drink plenty of water. Avoid stressful situations, avoid plasma donations, avoid straining(reading small print,texting, cell phone/computer use) as it may aggravate your symptoms.   If the maxalt does not help symptoms, or you have new or worsening symptoms(worsening headache, nausea,vomiting, chest pain,palpitations, etc,  go to ER immediately for further evaluation.     ED Prescriptions     Medication Sig Dispense Auth. Provider   rizatriptan (MAXALT-MLT) 10 MG disintegrating tablet Take 1 tablet (10 mg total) by mouth as needed for migraine. May repeat in 2 hours if needed 10 tablet Shivali Quackenbush, Para March, NP      PDMP not reviewed this encounter.   Clancy Gourd, NP 05/29/23 1600    Clancy Gourd, NP 05/29/23 [phone]

## 2023-05-30 ENCOUNTER — Ambulatory Visit: Payer: Self-pay | Admitting: Nurse Practitioner

## 2023-05-30 ENCOUNTER — Encounter: Payer: Self-pay | Admitting: Oncology

## 2023-05-30 ENCOUNTER — Telehealth: Payer: Self-pay | Admitting: Nurse Practitioner

## 2023-05-30 VITALS — BP 136/76 | HR 106 | Temp 97.6°F | Ht 68.0 in | Wt 195.4 lb

## 2023-05-30 DIAGNOSIS — G43811 Other migraine, intractable, with status migrainosus: Secondary | ICD-10-CM | POA: Diagnosis not present

## 2023-05-30 MED ORDER — KETOROLAC TROMETHAMINE 60 MG/2ML IM SOLN
60.0000 mg | Freq: Once | INTRAMUSCULAR | Status: AC
  Administered 2023-05-30: 60 mg via INTRAMUSCULAR

## 2023-05-30 NOTE — Assessment & Plan Note (Signed)
Chronic Problem.  Exacerbated. Has taken Maxalt x 4 without relief.  Typically resolves with one Maxalt. Rates his pain 10/10 with a stabbing in his tempool.  Having blurred vision, nausea, and confusion. Will order stat MRI for evaluation.  Toradol given in office.

## 2023-05-30 NOTE — Progress Notes (Signed)
BP 136/76 (BP Location: Left Arm, Patient Position: Sitting, Cuff Size: Large)   Pulse (!) 106   Temp 97.6 F (36.4 C) (Oral)   Ht 5\' 8"  (1.727 m)   Wt 195 lb 6.4 oz (88.6 kg)   SpO2 96%   BMI 29.71 kg/m    Subjective:    Patient ID: Keith Barber, male    DOB: 1971-02-16, 52 y.o.   MRN: [id number]  HPI: Keith Barber is a 52 y.o. male  Chief Complaint  Patient presents with   Migraine    Requesting Referral for Neurologist, Has had a migraine for 4 days that hasn't gone away    MIGRAINES Patient states he has been having a migraine for 4 days.  He takes Maxalt as needed for migraines.  States he is having blurred vision or foggy feeling.  He has taken 4 maxalt which hasn't helped.  He is also taking advil migraine in addition to the maxalt.  He gave Plasma on Friday at 12 and then at 130 and when he went to the bathroom he started sweating and needed to change his shirt.  He was able to get up and go to the bathroom and had diarrhea.  He felt like he was going to fall.  His symptoms have been similar since Friday afternoon.  When he coughs his head hurts and feels like something is stabbing him in the temple.  He has had some confusion with this.     Relevant past medical, surgical, family and social history reviewed and updated as indicated. Interim medical history since our last visit reviewed. Allergies and medications reviewed and updated.  Review of Systems  Eyes:  Positive for visual disturbance.  Neurological:  Positive for headaches.       Confusion    Per HPI unless specifically indicated above     Objective:    BP 136/76 (BP Location: Left Arm, Patient Position: Sitting, Cuff Size: Large)   Pulse (!) 106   Temp 97.6 F (36.4 C) (Oral)   Ht 5\' 8"  (1.727 m)   Wt 195 lb 6.4 oz (88.6 kg)   SpO2 96%   BMI 29.71 kg/m   Wt Readings from Last 3 Encounters:  05/30/23 195 lb 6.4 oz (88.6 kg)  05/29/23 195 lb (88.5 kg)  03/31/23 191 lb 6.4 oz (86.8  kg)    Physical Exam Vitals and nursing note reviewed.  Constitutional:      General: He is not in acute distress.    Appearance: Normal appearance. He is not ill-appearing, toxic-appearing or diaphoretic.  HENT:     Head: Normocephalic.     Right Ear: External ear normal.     Left Ear: External ear normal.     Nose: Nose normal. No congestion or rhinorrhea.     Mouth/Throat:     Mouth: Mucous membranes are moist.  Eyes:     General:        Right eye: No discharge.        Left eye: No discharge.     Extraocular Movements: Extraocular movements intact.     Conjunctiva/sclera: Conjunctivae normal.     Pupils: Pupils are equal, round, and reactive to light.  Cardiovascular:     Rate and Rhythm: Normal rate and regular rhythm.     Heart sounds: No murmur heard. Pulmonary:     Effort: Pulmonary effort is normal. No respiratory distress.     Breath sounds: Normal breath sounds. No wheezing, rhonchi  or rales.  Abdominal:     General: Abdomen is flat. Bowel sounds are normal.  Musculoskeletal:     Cervical back: Normal range of motion and neck supple.  Skin:    General: Skin is warm and dry.     Capillary Refill: Capillary refill takes less than 2 seconds.  Neurological:     General: No focal deficit present.     Mental Status: He is alert and oriented to person, place, and time.     Cranial Nerves: Cranial nerves 2-12 are intact.     Motor: Motor function is intact.     Coordination: Coordination is intact.     Gait: Gait is intact.  Psychiatric:        Mood and Affect: Mood normal.        Behavior: Behavior normal.        Thought Content: Thought content normal.        Judgment: Judgment normal.     Results for orders placed or performed in visit on 02/21/23  CBC with Differential/Platelet  Result Value Ref Range   WBC 8.8 4.0 - 10.5 K/uL   RBC 5.63 4.22 - 5.81 MIL/uL   Hemoglobin 16.6 13.0 - 17.0 g/dL   HCT 64.3 32.9 - 51.8 %   MCV 86.3 80.0 - 100.0 fL   MCH 29.5  26.0 - 34.0 pg   MCHC 34.2 30.0 - 36.0 g/dL   RDW 84.1 66.0 - 63.0 %   Platelets 209 150 - 400 K/uL   nRBC 0.0 0.0 - 0.2 %   Neutrophils Relative % 62 %   Neutro Abs 5.6 1.7 - 7.7 K/uL   Lymphocytes Relative 25 %   Lymphs Abs 2.2 0.7 - 4.0 K/uL   Monocytes Relative 8 %   Monocytes Absolute 0.7 0.1 - 1.0 K/uL   Eosinophils Relative 3 %   Eosinophils Absolute 0.2 0.0 - 0.5 K/uL   Basophils Relative 1 %   Basophils Absolute 0.0 0.0 - 0.1 K/uL   Immature Granulocytes 1 %   Abs Immature Granulocytes 0.05 0.00 - 0.07 K/uL      Assessment & Plan:   Problem List Items Addressed This Visit       Cardiovascular and Mediastinum   Migraines - Primary    Chronic Problem.  Exacerbated. Has taken Maxalt x 4 without relief.  Typically resolves with one Maxalt. Rates his pain 10/10 with a stabbing in his tempool.  Having blurred vision, nausea, and confusion. Will order stat MRI for evaluation.  Toradol given in office.       Relevant Medications   ketorolac (TORADOL) injection 60 mg (Start on 05/30/2023 12:00 PM)   Other Relevant Orders   MR BRAIN W WO CONTRAST     Follow up plan: No follow-ups on file.

## 2023-05-31 ENCOUNTER — Telehealth: Payer: Self-pay | Admitting: Nurse Practitioner

## 2023-05-31 NOTE — Telephone Encounter (Signed)
Patient was referred  and scheduled for an MRI  on 06/02/2023 @ 1:20 and would like to have medication to calm him down. Please advise

## 2023-06-01 MED ORDER — DIAZEPAM 5 MG PO TABS
5.0000 mg | ORAL_TABLET | Freq: Four times a day (QID) | ORAL | 0 refills | Status: DC | PRN
Start: 2023-06-01 — End: 2023-06-07

## 2023-06-01 NOTE — Telephone Encounter (Signed)
Medication sent to the pharmacy.

## 2023-06-02 ENCOUNTER — Telehealth: Payer: Self-pay | Admitting: Nurse Practitioner

## 2023-06-02 NOTE — Telephone Encounter (Signed)
Patient came into office requesting to speak with provider in reference to a prescription and a appointment he had with his eye doctor. That is all of the information patient would give me.Patient request phone call back at [phone].

## 2023-06-02 NOTE — Telephone Encounter (Signed)
Please call patient and get more details.  If he would like to speak with me, please have him make an appt.

## 2023-06-03 ENCOUNTER — Ambulatory Visit
Admission: RE | Admit: 2023-06-03 | Discharge: 2023-06-03 | Disposition: A | Discharge status: Home / Self Care | Payer: 59 | Source: Ambulatory Visit | Attending: Nurse Practitioner | Admitting: Nurse Practitioner

## 2023-06-03 ENCOUNTER — Other Ambulatory Visit: Payer: Self-pay

## 2023-06-03 ENCOUNTER — Telehealth: Payer: Self-pay

## 2023-06-03 ENCOUNTER — Observation Stay
Admission: EM | Admit: 2023-06-03 | Discharge: 2023-06-04 | Disposition: A | Discharge status: Home / Self Care | Payer: 59 | Attending: Internal Medicine | Admitting: Internal Medicine

## 2023-06-03 DIAGNOSIS — I639 Cerebral infarction, unspecified: Secondary | ICD-10-CM | POA: Diagnosis present

## 2023-06-03 DIAGNOSIS — R410 Disorientation, unspecified: Secondary | ICD-10-CM | POA: Diagnosis not present

## 2023-06-03 DIAGNOSIS — F419 Anxiety disorder, unspecified: Secondary | ICD-10-CM | POA: Diagnosis present

## 2023-06-03 DIAGNOSIS — I63432 Cerebral infarction due to embolism of left posterior cerebral artery: Secondary | ICD-10-CM | POA: Diagnosis not present

## 2023-06-03 DIAGNOSIS — K219 Gastro-esophageal reflux disease without esophagitis: Secondary | ICD-10-CM | POA: Diagnosis present

## 2023-06-03 DIAGNOSIS — H539 Unspecified visual disturbance: Secondary | ICD-10-CM

## 2023-06-03 DIAGNOSIS — Z7984 Long term (current) use of oral hypoglycemic drugs: Secondary | ICD-10-CM

## 2023-06-03 DIAGNOSIS — H538 Other visual disturbances: Secondary | ICD-10-CM | POA: Diagnosis present

## 2023-06-03 DIAGNOSIS — I6389 Other cerebral infarction: Secondary | ICD-10-CM | POA: Diagnosis not present

## 2023-06-03 DIAGNOSIS — F32A Depression, unspecified: Secondary | ICD-10-CM | POA: Diagnosis not present

## 2023-06-03 DIAGNOSIS — G43009 Migraine without aura, not intractable, without status migrainosus: Secondary | ICD-10-CM | POA: Diagnosis present

## 2023-06-03 DIAGNOSIS — E119 Type 2 diabetes mellitus without complications: Secondary | ICD-10-CM | POA: Diagnosis not present

## 2023-06-03 DIAGNOSIS — R200 Anesthesia of skin: Secondary | ICD-10-CM | POA: Insufficient documentation

## 2023-06-03 DIAGNOSIS — I6523 Occlusion and stenosis of bilateral carotid arteries: Secondary | ICD-10-CM | POA: Insufficient documentation

## 2023-06-03 DIAGNOSIS — Z87891 Personal history of nicotine dependence: Secondary | ICD-10-CM

## 2023-06-03 DIAGNOSIS — Z7985 Long-term (current) use of injectable non-insulin antidiabetic drugs: Secondary | ICD-10-CM

## 2023-06-03 DIAGNOSIS — G4733 Obstructive sleep apnea (adult) (pediatric): Secondary | ICD-10-CM | POA: Diagnosis not present

## 2023-06-03 DIAGNOSIS — R519 Headache, unspecified: Secondary | ICD-10-CM | POA: Diagnosis not present

## 2023-06-03 DIAGNOSIS — Z833 Family history of diabetes mellitus: Secondary | ICD-10-CM

## 2023-06-03 DIAGNOSIS — R29701 NIHSS score 1: Secondary | ICD-10-CM | POA: Diagnosis present

## 2023-06-03 DIAGNOSIS — Z7982 Long term (current) use of aspirin: Secondary | ICD-10-CM

## 2023-06-03 DIAGNOSIS — E785 Hyperlipidemia, unspecified: Secondary | ICD-10-CM | POA: Diagnosis not present

## 2023-06-03 DIAGNOSIS — E781 Pure hyperglyceridemia: Secondary | ICD-10-CM | POA: Diagnosis present

## 2023-06-03 DIAGNOSIS — I700 Atherosclerosis of aorta: Secondary | ICD-10-CM | POA: Diagnosis not present

## 2023-06-03 DIAGNOSIS — G43811 Other migraine, intractable, with status migrainosus: Secondary | ICD-10-CM

## 2023-06-03 DIAGNOSIS — Z888 Allergy status to other drugs, medicaments and biological substances status: Secondary | ICD-10-CM

## 2023-06-03 DIAGNOSIS — Z791 Long term (current) use of non-steroidal anti-inflammatories (NSAID): Secondary | ICD-10-CM

## 2023-06-03 DIAGNOSIS — Z79899 Other long term (current) drug therapy: Secondary | ICD-10-CM

## 2023-06-03 DIAGNOSIS — I10 Essential (primary) hypertension: Secondary | ICD-10-CM

## 2023-06-03 DIAGNOSIS — G43909 Migraine, unspecified, not intractable, without status migrainosus: Secondary | ICD-10-CM | POA: Diagnosis not present

## 2023-06-03 LAB — CBC
HCT: 52.6 % — ABNORMAL HIGH (ref 39.0–52.0)
Hemoglobin: 17.6 g/dL — ABNORMAL HIGH (ref 13.0–17.0)
MCH: 29.8 pg (ref 26.0–34.0)
MCHC: 33.5 g/dL (ref 30.0–36.0)
MCV: 89.2 fL (ref 80.0–100.0)
Platelets: 213 10*3/uL (ref 150–400)
RBC: 5.9 MIL/uL — ABNORMAL HIGH (ref 4.22–5.81)
RDW: 12.8 % (ref 11.5–15.5)
WBC: 10.2 10*3/uL (ref 4.0–10.5)
nRBC: 0 % (ref 0.0–0.2)

## 2023-06-03 LAB — COMPREHENSIVE METABOLIC PANEL WITH GFR
ALT: 26 U/L (ref 0–44)
AST: 25 U/L (ref 15–41)
Albumin: 3.9 g/dL (ref 3.5–5.0)
Alkaline Phosphatase: 74 U/L (ref 38–126)
Anion gap: 6 (ref 5–15)
BUN: 19 mg/dL (ref 6–20)
CO2: 24 mmol/L (ref 22–32)
Calcium: 8.5 mg/dL — ABNORMAL LOW (ref 8.9–10.3)
Chloride: 108 mmol/L (ref 98–111)
Creatinine, Ser: 1 mg/dL (ref 0.61–1.24)
GFR, Estimated: >60 mL/min
Glucose, Bld: 102 mg/dL — ABNORMAL HIGH (ref 70–99)
Potassium: 3.9 mmol/L (ref 3.5–5.1)
Sodium: 138 mmol/L (ref 135–145)
Total Bilirubin: 0.7 mg/dL
Total Protein: 6.7 g/dL (ref 6.5–8.1)

## 2023-06-03 LAB — DIFFERENTIAL
Abs Immature Granulocytes: 0.04 10*3/uL (ref 0.00–0.07)
Basophils Absolute: 0 10*3/uL (ref 0.0–0.1)
Basophils Relative: 0 %
Eosinophils Absolute: 0.1 10*3/uL (ref 0.0–0.5)
Eosinophils Relative: 1 %
Immature Granulocytes: 0 %
Lymphocytes Relative: 32 %
Lymphs Abs: 3.2 10*3/uL (ref 0.7–4.0)
Monocytes Absolute: 0.7 10*3/uL (ref 0.1–1.0)
Monocytes Relative: 7 %
Neutro Abs: 6 10*3/uL (ref 1.7–7.7)
Neutrophils Relative %: 60 %

## 2023-06-03 LAB — ETHANOL: Alcohol, Ethyl (B): <10 mg/dL (ref <10)

## 2023-06-03 LAB — PROTIME-INR
INR: 1 (ref 0.8–1.2)
Prothrombin Time: 13 s (ref 11.4–15.2)

## 2023-06-03 LAB — APTT: aPTT: 25 s (ref 24–36)

## 2023-06-03 MED ORDER — GADOPICLENOL 0.5 MMOL/ML IV SOLN
10.0000 mL | Freq: Once | INTRAVENOUS | Status: AC | PRN
  Administered 2023-06-03: 8 mL via INTRAVENOUS

## 2023-06-03 NOTE — ED Triage Notes (Signed)
Pt to ED for abnormal CT showing subacute infarct. Reports vision changes to bilateral eyes. Denies weakness. Reports sx started last week.

## 2023-06-03 NOTE — Telephone Encounter (Signed)
Call from [place] at [facility] radiology with critical results.   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

## 2023-06-03 NOTE — Telephone Encounter (Signed)
Called and spoke with patient letting him know of the information from provider.  He stated that he would probably pick up the samples after his MRI.  I did advise him that the provider recommends him not taking the Advil Migraine as much as he is.  He stated he knows but that's all he has to get him through.  Did inform him that it was only 2 pills, 1 a day.  Okay to pick up after 12:45 through 5:00 pm today.  Pt okay with appointment on Tuesday and he verbalized understanding.

## 2023-06-03 NOTE — ED Provider Notes (Signed)
[facility] Provider Note    Event Date/Time   First MD Initiated Contact with Patient 06/03/23 2340     (approximate)   History   Abnormal MRI   HPI  Keith Barber is a 52 y.o. male referred to the ED for abnormal MRI.  Patient with a 1 week history of vision changes and disorientation.  Seen by his PCP earlier this week who ordered MRI of the brain.  He was called this afternoon telling him his MRI was abnormal and to proceed to the ED for further evaluation.  Denies slurred speech, facial droop, extremity weakness/numbness/tingling.  Denies fever/chills, chest pain, shortness of breath, abdominal pain, nausea, vomiting or dizziness.  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 Had oncology visit earlier this year with phlebotomy. MRI Brain:  Likely subacute enhancing infarcts in the posterior right temporal lobe and right occipital lobe as well as the right thalamus (right PCA territory).  Past Medical History   Past Medical History:  Diagnosis Date   Bone spur    LEFT SHOULDER THAT MAKES HIS LEFT ARM TINGLE - had removed   Diverticulitis large intestine 09/2017   GERD (gastroesophageal reflux disease)    Hemorrhoids    Hyperlipidemia    Hypertension    Migraines    rare migraines   Sleep apnea      Active Problem List   Patient Active Problem List   Diagnosis Date Noted   Acute CVA (cerebrovascular accident) (HCC) 06/04/2023   H/O migraine with aura 05/29/2023   Stress reaction 05/29/2023   Encounter for medication refill 05/29/2023   Ocular migraine with status migrainosus 03/04/2023   Vitamin D deficiency 11/02/2022   History of colonic polyps 08/30/2022   Erythrocytosis 05/07/2022   Hand pain, left 07/09/2020   Lateral epicondylitis, left elbow 07/09/2020   Radial nerve compression, left 07/09/2020   Traumatic ecchymosis of left hand 07/09/2020   Rupture of left long head biceps tendon 01/14/2020   Uncontrolled  type 2 diabetes mellitus with hyperglycemia (HCC) 04/18/2018   Benign neoplasm of rectosigmoid junction    Internal hemorrhoids    Residual hemorrhoidal skin tags    Diverticulosis of large intestine without diverticulitis    Stomach irritation    GERD (gastroesophageal reflux disease)    History of diverticulitis 12/26/2017   Onychomycosis 12/26/2017   Adhesive capsulitis of left shoulder 12/15/2017   Impingement syndrome of left shoulder region 07/05/2017   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   Umbilical hernia without obstruction and without gangrene    Bilateral inguinal hernia without obstruction or gangrene    History of tobacco abuse 11/26/2016   Hypertension associated with diabetes (HCC)    Hyperlipidemia associated with type 2 diabetes mellitus (HCC)    Migraines    Rotator cuff tendinitis, left 11/12/2015   Labral tear of shoulder, left, initial encounter 11/12/2015     Past Surgical History   Past Surgical History:  Procedure Laterality Date   78 HOUR PH STUDY N/A 05/30/2018   Procedure: 24 HOUR PH STUDY;  Surgeon: Pasty Spillers, MD;  Location: [facility];  Service: Gastroenterology;  Laterality: N/A;   bicep tendon tear      COLONOSCOPY WITH PROPOFOL N/A 02/01/2018   Procedure: COLONOSCOPY WITH PROPOFOL;  Surgeon: Pasty Spillers, MD;  Location: [facility];  Service: Endoscopy;  Laterality: N/A;   COLONOSCOPY WITH PROPOFOL N/A 08/30/2022   Procedure: COLONOSCOPY WITH PROPOFOL;  Surgeon: Wyline Mood,  MD;  Location: [facility];  Service: Gastroenterology;  Laterality: N/A;   ESOPHAGEAL MANOMETRY N/A 05/30/2018   Procedure: ESOPHAGEAL MANOMETRY (EM);  Surgeon: Pasty Spillers, MD;  Location: [facility];  Service: Gastroenterology;  Laterality: N/A;   ESOPHAGOGASTRODUODENOSCOPY (EGD) WITH PROPOFOL N/A 02/01/2018   Procedure: ESOPHAGOGASTRODUODENOSCOPY (EGD) WITH PROPOFOL;  Surgeon: Pasty Spillers, MD;   Location: [facility];  Service: Endoscopy;  Laterality: N/A;   INGUINAL HERNIA REPAIR Bilateral 01/21/2017   Procedure: LAPAROSCOPIC BILATERAL INGUINAL HERNIA REPAIR;  Surgeon: Leafy Ro, MD;  Location: [facility];  Service: General;  Laterality: Bilateral;   KNEE SURGERY Left    SHOULDER ARTHROSCOPY WITH DISTAL CLAVICLE RESECTION Left 07/05/2017   Procedure: LEFT SHOULDER ARTHROSCOPY, SUBACROMIAL DECOMPRESSION, DISTAL CLAVICLE RESECTION, POSSIBLE MINI OPEN ROTATOR CUFF REPAIR;  Surgeon: Valeria Batman, MD;  Location: [facility];  Service: Orthopedics;  Laterality: Left;   SHOULDER CLOSED REDUCTION Left 12/13/2017   Procedure: LEFT SHOULDER MANIPULATION with steroid injection;  Surgeon: Valeria Batman, MD;  Location: [facility] OR;  Service: Orthopedics;  Laterality: Left;   SHOULDER SURGERY Right    UMBILICAL HERNIA REPAIR N/A 01/21/2017   Procedure: HERNIA REPAIR UMBILICAL ADULT;  Surgeon: Leafy Ro, MD;  Location: [facility];  Service: General;  Laterality: N/A;     Home Medications   Prior to Admission medications   Medication Sig Start Date End Date Taking? Authorizing Provider  Accu-Chek Softclix Lancets lancets 1 each by Other route in the morning and at bedtime. Use as instructed 11/18/22   Larae Grooms, NP  amitriptyline (ELAVIL) 10 MG tablet Take 1 tablet (10 mg total) by mouth at bedtime. 02/28/23 04/29/23  Pearley, Sherran Needs, NP  Blood Glucose Monitoring Suppl (ACCU-CHEK AVIVA PLUS) w/Device KIT 1 each by Does not apply route in the morning and at bedtime. 11/18/22   Larae Grooms, NP  cyclobenzaprine (FLEXERIL) 5 MG tablet Take 1 tablet (5 mg total) by mouth daily as needed for muscle spasms. 02/28/23   Pearley, Sherran Needs, NP  diazepam (VALIUM) 5 MG tablet Take 1 tablet (5 mg total) by mouth every 6 (six) hours as needed for anxiety. Take 1 tablet 1 hour prior to procedure and repeat with second tablet when walking into imaging. 06/01/23   Larae Grooms, NP   glucose blood (ACCU-CHEK AVIVA PLUS) test strip 1 each by Other route in the morning and at bedtime. Use as instructed 11/18/22   Larae Grooms, NP  metFORMIN (GLUCOPHAGE) 500 MG tablet TAKE 2 TABLETS BY MOUTH TWICE DAILY WITH MEALS 04/18/23   Larae Grooms, NP  metoprolol succinate (TOPROL-XL) 25 MG 24 hr tablet Take 1 tablet by mouth once daily 04/11/23   Larae Grooms, NP  naproxen sodium (ALEVE) 220 MG tablet Take 2 tablets by mouth daily.    [provider]  rizatriptan (MAXALT-MLT) 10 MG disintegrating tablet Take 1 tablet (10 mg total) by mouth as needed for migraine. May repeat in 2 hours if needed 05/29/23   Defelice, Para March, NP  rosuvastatin (CRESTOR) 10 MG tablet Take 1 tablet (10 mg total) by mouth daily. 03/31/23   Mecum, Oswaldo Conroy, PA-C  TRULICITY 3 MG/0.5ML SOAJ INJECT 3MG  ONCE A WEEK AS DIRECTED 05/13/23   Larae Grooms, NP  valsartan (DIOVAN) 40 MG tablet Take 1 tablet by mouth once daily 03/08/23   Mecum, Erin E, PA-C  Vitamin D, Ergocalciferol, (DRISDOL) 1.25 MG (50000 UNIT) CAPS capsule Take 1 capsule (50,000 Units total) by mouth every 7 (seven) days. 11/02/22  Olevia Perches P, DO     Allergies  Amlodipine   Family History   Family History  Problem Relation Age of Onset   Diabetes Mother    Migraines Mother    Lung cancer Father    Lung cancer Paternal Grandfather    Cancer Neg Hx    COPD Neg Hx    Heart disease Neg Hx    Stroke Neg Hx    Sleep apnea Neg Hx      Physical Exam  Triage Vital Signs: ED Triage Vitals  Encounter Vitals Group     BP 06/03/23 1840 (!) 130/104     Systolic BP Percentile --      Diastolic BP Percentile --      Pulse Rate 06/03/23 1838 86     Resp 06/03/23 1838 18     Temp 06/03/23 1838 97.8 F (36.6 C)     Temp src --      SpO2 06/03/23 1838 97 %     Weight 06/03/23 1839 194 lb 0.1 oz (88 kg)     Height 06/03/23 1839 5\' 8"  (1.727 m)     Head Circumference --      Peak Flow --      Pain Score 06/03/23  1839 0     Pain Loc --      Pain Education --      Exclude from Growth Chart --     Updated Vital Signs: BP (!) 121/90 (BP Location: Left Arm)   Pulse 93   Temp 97.6 F (36.4 C) (Oral)   Resp 16   Ht 5\' 8"  (1.727 m)   Wt 87.5 kg   SpO2 98%   BMI 29.35 kg/m    General: Awake, no distress.  CV:  RRR.  Good peripheral perfusion.  Resp:  Normal effort.  CTAB. Abd:  Nontender.  No distention.  Other:  PERRL.  EOMI.  No carotid bruits.  Alert and oriented x 3.  CN II-XII grossly intact.  5/5 motor strength and sensation all extremities.  M AE x 4.   ED Results / Procedures / Treatments  Labs (all labs ordered are listed, but only abnormal results are displayed) Labs Reviewed  CBC - Abnormal; Notable for the following components:      Result Value   RBC 5.90 (*)    Hemoglobin 17.6 (*)    HCT 52.6 (*)    All other components within normal limits  COMPREHENSIVE METABOLIC PANEL - Abnormal; Notable for the following components:   Glucose, Bld 102 (*)    Calcium 8.5 (*)    All other components within normal limits  PROTIME-INR  APTT  DIFFERENTIAL  ETHANOL  HEMOGLOBIN A1C  TROPONIN I (HIGH SENSITIVITY)     EKG  None   RADIOLOGY I have independently reviewed the MRI brain results done earlier this afternoon  MRI brain: Subacute enhancing infarcts in posterior right temporal lobe and right occipital lobe and right thalamus  Official radiology report(s): MR BRAIN W WO CONTRAST  Result Date: 06/03/2023 CLINICAL DATA:  Headache, neuro deficit Confusion and blurred vision EXAM: MRI HEAD WITHOUT AND WITH CONTRAST TECHNIQUE: Multiplanar, multiecho pulse sequences of the brain and surrounding structures were obtained without and with intravenous contrast. CONTRAST:  8 mL of Vueway COMPARISON:  CT head June 30, 21. FINDINGS: Brain: Multiple areas restricted diffusion the posterior right temporal lobe and right occipital lobe as well as the right thalamus (right PCA territory),  with areas of associated  curvilinear enhancement. Associated restricted diffusion and edema. No midline shift. No acute hemorrhage or hydrocephalus. Remote left cerebellar infarct. Vascular: Major arterial flow voids are maintained at the skull base. Skull and upper cervical spine: Normal marrow signal. Sinuses/Orbits: Clear sinuses.  No acute orbital findings. 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 These results will be called to the ordering clinician or representative by the Radiologist Assistant, and communication documented in the PACS or [employer]. Electronically Signed   By: Feliberto Harts M.D.   On: 06/03/2023 14:03     PROCEDURES:  Critical Care performed: No  NIH Stroke Scale  Interval: Baseline Time: 2:27 AM Person Administering Scale: Cinthya Bors J  Administer stroke scale items in the order listed. Record performance in each category after each subscale exam. Do not go back and change scores. Follow directions provided for each exam technique. Scores should reflect what the patient does, not what the clinician thinks the patient can do. The clinician should record answers while administering the exam and work quickly. Except where indicated, the patient should not be coached (i.e., repeated requests to patient to make a special effort).   1a  Level of consciousness: 0=alert; keenly responsive  1b. LOC questions:  0=Performs both tasks correctly  1c. LOC commands: 0=Performs both tasks correctly  2.  Best Gaze: 0=normal  3.  Visual: 0=No visual loss  4. Facial Palsy: 0=Normal symmetric movement  5a.  Motor left arm: 0=No drift, limb holds 90 (or 45) degrees for full 10 seconds  5b.  Motor right arm: 0=No drift, limb holds 90 (or 45) degrees for full 10 seconds  6a. motor left leg: 0=No drift, limb holds 90 (or 45) degrees for full 10 seconds  6b   Motor right leg:  0=No drift, limb holds 90 (or 45) degrees for full 10 seconds  7. Limb Ataxia: 0=Absent  8.  Sensory: 0=Normal; no sensory loss  9. Best Language:  0=No aphasia, normal  10. Dysarthria: 0=Normal  11. Extinction and Inattention: 0=No abnormality  12. Distal motor function: 0=Normal   Total:   0     .1-3 Lead EKG Interpretation  Performed by: Irean Hong, MD Authorized by: Irean Hong, MD     Interpretation: normal     ECG rate:  60   ECG rate assessment: normal     Rhythm: sinus rhythm     Ectopy: none     Conduction: normal   Comments:     Patient placed on cardiac monitor to evaluate for arrhythmias    MEDICATIONS ORDERED IN ED: Medications  aspirin chewable tablet 324 mg (324 mg Oral Given 06/04/23 0038)  clopidogrel (PLAVIX) tablet 300 mg (300 mg Oral Given 06/04/23 0038)     IMPRESSION / MDM / ASSESSMENT AND PLAN / ED COURSE  I reviewed the triage vital signs and the nursing notes.                             52 year old male presenting with abnormal MRI, vision changes and cognitive deficits.  Differential diagnosis includes but is not limited to CVA, TIA, ICH, ICH, infectious, metabolic etiologies, etc.  I personally reviewed patient's records and note his PCP office visit on 05/30/2023 as well as telephone communication from today.  Patient's presentation is most consistent with acute presentation with potential threat to life  or bodily function.  The patient is on the cardiac monitor to evaluate for evidence of arrhythmia and/or significant heart rate changes.  Laboratory results unremarkable.  Will administer baby aspirin and Plavix.  Will consult hospitalist services for evaluation and admission.   FINAL CLINICAL IMPRESSION(S) / ED DIAGNOSES   Final diagnoses:  Cerebrovascular accident (CVA), unspecified mechanism (HCC)  Vision changes     Rx / DC Orders   ED Discharge Orders     None        Note:  This document was  prepared using Dragon voice recognition software and may include unintentional dictation errors.   Irean Hong, MD 06/04/23 [phone]

## 2023-06-03 NOTE — Telephone Encounter (Signed)
Called patient to get more details regarding his reasons of wanting to speak with the provider.  He stated that he was concerned that the prescription that he is prescribed for insurance only covers 10-12 a month.  That this past weekend he got a bottle of Advil Migraine and has already took half the bottle.  He seems to think he has done built up a tolerance for it and may need something else prescribed.  He also wanted to inform provider that he has his MRI today and he spoke with his eye doctor and they are trying to get him set up with an appointment either today or Monday.  Please advise.

## 2023-06-03 NOTE — Telephone Encounter (Signed)
I can see patient for a virtual visit on Tuesday at 1120.  Please let patient know that he can pick up a sample of Nurtec.  He can only take 1 a day.  If this is helpful for his migraines we can try to get this approved after his visit and after he has tried it out.  I do not recommend using as much Advil Migraine as he is using.

## 2023-06-03 NOTE — ED Provider Notes (Incomplete)
[facility] Provider Note    Event Date/Time   First MD Initiated Contact with Patient 06/03/23 2340     (approximate)   History   No chief complaint on file.   HPI {Remember to add pertinent medical, surgical, social, and/or OB history to HPI:1} Keith Barber is a 52 y.o. 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  Past Medical History   Past Medical History:  Diagnosis Date  . Bone spur    LEFT SHOULDER THAT MAKES HIS LEFT ARM TINGLE - had removed  . Diverticulitis large intestine 09/2017  . GERD (gastroesophageal reflux disease)   . Hemorrhoids   . Hyperlipidemia   . Hypertension   . Migraines    rare migraines  . Sleep apnea      Active Problem List   Patient Active Problem List   Diagnosis Date Noted  . H/O migraine with aura 05/29/2023  . Stress reaction 05/29/2023  . Encounter for medication refill 05/29/2023  . Ocular migraine with status migrainosus 03/04/2023  . Vitamin D deficiency 11/02/2022  . History of colonic polyps 08/30/2022  . Erythrocytosis 05/07/2022  . Hand pain, left 07/09/2020  . Lateral epicondylitis, left elbow 07/09/2020  . Radial nerve compression, left 07/09/2020  . Traumatic ecchymosis of left hand 07/09/2020  . Rupture of left long head biceps tendon 01/14/2020  . Uncontrolled type 2 diabetes mellitus with hyperglycemia (HCC) 04/18/2018  . Benign neoplasm of rectosigmoid junction   . Internal hemorrhoids   . Residual hemorrhoidal skin tags   . Diverticulosis of large intestine without diverticulitis   . Stomach irritation   . GERD (gastroesophageal reflux disease)   . History of diverticulitis 12/26/2017  . Onychomycosis 12/26/2017  . Adhesive capsulitis of left shoulder 12/15/2017  . Impingement syndrome of left shoulder region 07/05/2017  . 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/2017  . Umbilical hernia without obstruction and without gangrene   . Bilateral inguinal hernia without obstruction or gangrene   . History of tobacco abuse 11/26/2016  . Hypertension associated with diabetes (HCC)   . Hyperlipidemia associated with type 2 diabetes mellitus (HCC)   . Migraines   . Rotator cuff tendinitis, left 11/12/2015  . Labral tear of shoulder, left, initial encounter 11/12/2015     Past Surgical History   Past Surgical History:  Procedure Laterality Date  . 24 HOUR PH STUDY N/A 05/30/2018   Procedure: 24 HOUR PH STUDY;  Surgeon: Pasty Spillers, MD;  Location: [facility] ENDOSCOPY;  Service: Gastroenterology;  Laterality: N/A;  . bicep tendon tear     . COLONOSCOPY WITH PROPOFOL N/A 02/01/2018   Procedure: COLONOSCOPY WITH PROPOFOL;  Surgeon: Pasty Spillers, MD;  Location: [facility] ENDOSCOPY;  Service: Endoscopy;  Laterality: N/A;  . COLONOSCOPY WITH PROPOFOL N/A 08/30/2022   Procedure: COLONOSCOPY WITH PROPOFOL;  Surgeon: Wyline Mood, MD;  Location: [facility] ENDOSCOPY;  Service: Gastroenterology;  Laterality: N/A;  . ESOPHAGEAL MANOMETRY N/A 05/30/2018   Procedure: ESOPHAGEAL MANOMETRY (EM);  Surgeon: Pasty Spillers, MD;  Location: [facility] ENDOSCOPY;  Service: Gastroenterology;  Laterality: N/A;  . ESOPHAGOGASTRODUODENOSCOPY (EGD) WITH PROPOFOL N/A 02/01/2018   Procedure: ESOPHAGOGASTRODUODENOSCOPY (EGD) WITH PROPOFOL;  Surgeon: Pasty Spillers, MD;  Location: [facility] ENDOSCOPY;  Service: Endoscopy;  Laterality: N/A;  . INGUINAL HERNIA REPAIR Bilateral 01/21/2017   Procedure: LAPAROSCOPIC BILATERAL INGUINAL HERNIA REPAIR;  Surgeon: Leafy Ro, MD;  Location: [facility] ORS;  Service: General;  Laterality: Bilateral;  . KNEE SURGERY Left   . SHOULDER ARTHROSCOPY WITH DISTAL CLAVICLE RESECTION Left 07/05/2017   Procedure: LEFT SHOULDER ARTHROSCOPY, SUBACROMIAL DECOMPRESSION, DISTAL CLAVICLE RESECTION, POSSIBLE MINI OPEN ROTATOR CUFF  REPAIR;  Surgeon: Valeria Batman, MD;  Location: [facility] OR;  Service: Orthopedics;  Laterality: Left;  . SHOULDER CLOSED REDUCTION Left 12/13/2017   Procedure: LEFT SHOULDER MANIPULATION with steroid injection;  Surgeon: Valeria Batman, MD;  Location: [facility] OR;  Service: Orthopedics;  Laterality: Left;  . SHOULDER SURGERY Right   . UMBILICAL HERNIA REPAIR N/A 01/21/2017   Procedure: HERNIA REPAIR UMBILICAL ADULT;  Surgeon: Leafy Ro, MD;  Location: [facility] ORS;  Service: General;  Laterality: N/A;     Home Medications   Prior to Admission medications   Medication Sig Start Date End Date Taking? Authorizing Provider  Accu-Chek Softclix Lancets lancets 1 each by Other route in the morning and at bedtime. Use as instructed 11/18/22   Larae Grooms, NP  amitriptyline (ELAVIL) 10 MG tablet Take 1 tablet (10 mg total) by mouth at bedtime. 02/28/23 04/29/23  Pearley, Sherran Needs, NP  Blood Glucose Monitoring Suppl (ACCU-CHEK AVIVA PLUS) w/Device KIT 1 each by Does not apply route in the morning and at bedtime. 11/18/22   Larae Grooms, NP  cyclobenzaprine (FLEXERIL) 5 MG tablet Take 1 tablet (5 mg total) by mouth daily as needed for muscle spasms. 02/28/23   Pearley, Sherran Needs, NP  diazepam (VALIUM) 5 MG tablet Take 1 tablet (5 mg total) by mouth every 6 (six) hours as needed for anxiety. Take 1 tablet 1 hour prior to procedure and repeat with second tablet when walking into imaging. 06/01/23   Larae Grooms, NP  glucose blood (ACCU-CHEK AVIVA PLUS) test strip 1 each by Other route in the morning and at bedtime. Use as instructed 11/18/22   Larae Grooms, NP  metFORMIN (GLUCOPHAGE) 500 MG tablet TAKE 2 TABLETS BY MOUTH TWICE DAILY WITH MEALS 04/18/23   Larae Grooms, NP  metoprolol succinate (TOPROL-XL) 25 MG 24 hr tablet Take 1 tablet by mouth once daily 04/11/23   Larae Grooms, NP  naproxen sodium (ALEVE) 220 MG tablet Take 2 tablets by mouth daily.    [provider]  rizatriptan (MAXALT-MLT) 10 MG disintegrating tablet Take 1 tablet (10 mg total) by mouth as needed for migraine. May repeat in 2 hours if needed 05/29/23   Defelice, Para March, NP  rosuvastatin (CRESTOR) 10 MG tablet Take 1 tablet (10 mg total) by mouth daily. 03/31/23   Mecum, Oswaldo Conroy, PA-C  TRULICITY 3 MG/0.5ML SOAJ INJECT 3MG  ONCE A WEEK AS DIRECTED 05/13/23   Larae Grooms, NP  valsartan (DIOVAN) 40 MG tablet Take 1 tablet by mouth once daily 03/08/23   Mecum, Erin E, PA-C  Vitamin D, Ergocalciferol, (DRISDOL) 1.25 MG (50000 UNIT) CAPS capsule Take 1 capsule (50,000 Units total) by mouth every 7 (seven) days. 11/02/22   Olevia Perches P, DO     Allergies  Amlodipine   Family History   Family History  Problem Relation Age of Onset  . Diabetes Mother   . Migraines Mother   . Lung cancer Father   . Lung cancer Paternal Grandfather   . Cancer Neg Hx   . COPD Neg Hx   . Heart disease Neg Hx   . Stroke Neg Hx   . Sleep apnea Neg Hx      Physical Exam  Triage Vital Signs: ED Triage Vitals  Encounter Vitals Group     BP 06/03/23 1840 (!) 130/104     Systolic BP Percentile --      Diastolic BP Percentile --      Pulse Rate 06/03/23 1838 86     Resp 06/03/23 1838 18     Temp 06/03/23 1838 97.8 F (36.6 C)     Temp src --      SpO2 06/03/23 1838 97 %     Weight 06/03/23 1839 194 lb 0.1 oz (88 kg)     Height 06/03/23 1839 5\' 8"  (1.727 m)     Head Circumference --      Peak Flow --      Pain Score 06/03/23 1839 0     Pain Loc --      Pain Education --      Exclude from Growth Chart --     Updated Vital Signs: BP (!) 130/104   Pulse 86   Temp 97.8 F (36.6 C)   Resp 18   Ht 5\' 8"  (1.727 m)   Wt 88 kg   SpO2 97%   BMI 29.50 kg/m   {Only need to document appropriate and relevant physical exam:1} General: Awake, no distress. *** CV:  Good peripheral perfusion. *** Resp:  Normal effort. *** Abd:  No distention. *** Other:  ***   ED Results /  Procedures / Treatments  Labs (all labs ordered are listed, but only abnormal results are displayed) Labs Reviewed  CBC - Abnormal; Notable for the following components:      Result Value   RBC 5.90 (*)    Hemoglobin 17.6 (*)    HCT 52.6 (*)    All other components within normal limits  COMPREHENSIVE METABOLIC PANEL - Abnormal; Notable for the following components:   Glucose, Bld 102 (*)    Calcium 8.5 (*)    All other components within normal limits  PROTIME-INR  APTT  DIFFERENTIAL  ETHANOL     EKG  ***   RADIOLOGY *** {You MUST document your own interpretation of imaging, as well as the fact that you reviewed the radiologist's report!:1}  Official radiology report(s): MR BRAIN W WO CONTRAST  Result Date: 06/03/2023 CLINICAL DATA:  Headache, neuro deficit Confusion and blurred vision EXAM: MRI HEAD WITHOUT AND WITH CONTRAST TECHNIQUE: Multiplanar, multiecho pulse sequences of the brain and surrounding structures were obtained without and with intravenous contrast. CONTRAST:  8 mL of [employer]way COMPARISON:  CT head June 30, 21. FINDINGS: Brain: Multiple areas restricted diffusion the posterior right temporal lobe and right occipital lobe as well as the right thalamus (right PCA territory), with areas of associated curvilinear enhancement. Associated restricted diffusion and edema. No midline shift. No acute hemorrhage or hydrocephalus. Remote left cerebellar infarct. Vascular: Major arterial flow voids are maintained at the skull base. Skull and upper cervical spine: Normal marrow signal. Sinuses/Orbits: Clear sinuses.  No acute orbital findings. 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 These results will be called to the ordering clinician or representative by the Radiologist Assistant, and communication documented in the PACS or [employer]. Electronically  Signed   By: Feliberto Harts M.D.   On: 06/03/2023 14:03     PROCEDURES:  Critical Care performed: {CriticalCareYesNo:19197::"Yes, see critical care procedure note(s)","No"}  Procedures   MEDICATIONS ORDERED IN ED: Medications - No data to display  IMPRESSION / MDM / ASSESSMENT AND PLAN / ED COURSE  I reviewed the triage vital signs and the nursing notes.                              Differential diagnosis includes, but is not limited to, ***  Patient's presentation is most consistent with {EM COPA:27473}  {If the patient is on the monitor, remove the brackets and asterisks on the sentence below and remember to document it as a Procedure as well. Otherwise delete the sentence below:1} {**The patient is on the cardiac monitor to evaluate for evidence of arrhythmia and/or significant heart rate changes.**}  {Remember to include, when applicable, any/all of the following data: independent review of imaging independent review of labs (comment specifically on pertinent positives and negatives) review of specific prior hospitalizations, PCP/specialist notes, etc. discuss meds given and prescribed document any discussion with consultants (including hospitalists) any clinical decision tools you used and why (PECARN, NEXUS, etc.) did you consider admitting the patient? document social determinants of health affecting patient's care (homelessness, inability to follow up in a timely fashion, etc) document any pre-existing conditions increasing risk on current visit (e.g. diabetes and HTN increasing danger of high-risk chest pain/ACS) describes what meds you gave (especially parenteral) and why any other interventions?:1}      FINAL CLINICAL IMPRESSION(S) / ED DIAGNOSES   Final diagnoses:  None     Rx / DC Orders   ED Discharge Orders     None        Note:  This document was prepared using Dragon voice recognition software and may include unintentional dictation  errors.

## 2023-06-04 ENCOUNTER — Encounter: Payer: Self-pay | Admitting: Family Medicine

## 2023-06-04 ENCOUNTER — Inpatient Hospital Stay (HOSPITAL_COMMUNITY)
Admit: 2023-06-04 | Discharge: 2023-06-04 | Disposition: A | Discharge status: Home / Self Care | Payer: 59 | Attending: Internal Medicine | Admitting: Internal Medicine

## 2023-06-04 ENCOUNTER — Inpatient Hospital Stay: Payer: 59

## 2023-06-04 DIAGNOSIS — F419 Anxiety disorder, unspecified: Secondary | ICD-10-CM | POA: Insufficient documentation

## 2023-06-04 DIAGNOSIS — I10 Essential (primary) hypertension: Secondary | ICD-10-CM

## 2023-06-04 DIAGNOSIS — E785 Hyperlipidemia, unspecified: Secondary | ICD-10-CM | POA: Diagnosis not present

## 2023-06-04 DIAGNOSIS — Z7984 Long term (current) use of oral hypoglycemic drugs: Secondary | ICD-10-CM | POA: Diagnosis not present

## 2023-06-04 DIAGNOSIS — E119 Type 2 diabetes mellitus without complications: Secondary | ICD-10-CM

## 2023-06-04 DIAGNOSIS — Z888 Allergy status to other drugs, medicaments and biological substances status: Secondary | ICD-10-CM | POA: Diagnosis not present

## 2023-06-04 DIAGNOSIS — Z791 Long term (current) use of non-steroidal anti-inflammatories (NSAID): Secondary | ICD-10-CM | POA: Diagnosis not present

## 2023-06-04 DIAGNOSIS — I672 Cerebral atherosclerosis: Secondary | ICD-10-CM | POA: Diagnosis not present

## 2023-06-04 DIAGNOSIS — I6389 Other cerebral infarction: Secondary | ICD-10-CM

## 2023-06-04 DIAGNOSIS — Z833 Family history of diabetes mellitus: Secondary | ICD-10-CM | POA: Diagnosis not present

## 2023-06-04 DIAGNOSIS — Z87891 Personal history of nicotine dependence: Secondary | ICD-10-CM | POA: Diagnosis not present

## 2023-06-04 DIAGNOSIS — I639 Cerebral infarction, unspecified: Secondary | ICD-10-CM | POA: Diagnosis present

## 2023-06-04 DIAGNOSIS — E781 Pure hyperglyceridemia: Secondary | ICD-10-CM | POA: Diagnosis present

## 2023-06-04 DIAGNOSIS — M79605 Pain in left leg: Secondary | ICD-10-CM | POA: Diagnosis not present

## 2023-06-04 DIAGNOSIS — K219 Gastro-esophageal reflux disease without esophagitis: Secondary | ICD-10-CM | POA: Diagnosis present

## 2023-06-04 DIAGNOSIS — R29701 NIHSS score 1: Secondary | ICD-10-CM | POA: Diagnosis present

## 2023-06-04 DIAGNOSIS — M79604 Pain in right leg: Secondary | ICD-10-CM | POA: Diagnosis not present

## 2023-06-04 DIAGNOSIS — I63432 Cerebral infarction due to embolism of left posterior cerebral artery: Secondary | ICD-10-CM | POA: Diagnosis present

## 2023-06-04 DIAGNOSIS — F32A Depression, unspecified: Secondary | ICD-10-CM | POA: Diagnosis present

## 2023-06-04 DIAGNOSIS — Z79899 Other long term (current) drug therapy: Secondary | ICD-10-CM | POA: Diagnosis not present

## 2023-06-04 DIAGNOSIS — H538 Other visual disturbances: Secondary | ICD-10-CM | POA: Diagnosis present

## 2023-06-04 DIAGNOSIS — G4733 Obstructive sleep apnea (adult) (pediatric): Secondary | ICD-10-CM | POA: Diagnosis present

## 2023-06-04 DIAGNOSIS — Z7982 Long term (current) use of aspirin: Secondary | ICD-10-CM | POA: Diagnosis not present

## 2023-06-04 DIAGNOSIS — R410 Disorientation, unspecified: Secondary | ICD-10-CM | POA: Diagnosis present

## 2023-06-04 DIAGNOSIS — G43009 Migraine without aura, not intractable, without status migrainosus: Secondary | ICD-10-CM | POA: Diagnosis present

## 2023-06-04 DIAGNOSIS — Z7985 Long-term (current) use of injectable non-insulin antidiabetic drugs: Secondary | ICD-10-CM

## 2023-06-04 HISTORY — DX: Hyperlipidemia, unspecified: E78.5

## 2023-06-04 HISTORY — DX: Essential (primary) hypertension: I10

## 2023-06-04 HISTORY — DX: Depression, unspecified: F32.A

## 2023-06-04 HISTORY — DX: Type 2 diabetes mellitus without complications: E11.9

## 2023-06-04 HISTORY — DX: Long-term (current) use of injectable non-insulin antidiabetic drugs: Z79.85

## 2023-06-04 HISTORY — DX: Cerebral infarction, unspecified: I63.9

## 2023-06-04 LAB — ANTITHROMBIN III: AntiThromb III Func: 124 % — ABNORMAL HIGH (ref 75–120)

## 2023-06-04 LAB — ECHOCARDIOGRAM COMPLETE BUBBLE STUDY
AR max vel: 1.33 cm2
AV Area VTI: 1.2 cm2
AV Area mean vel: 1.19 cm2
AV Mean grad: 23 mm[Hg]
AV Peak grad: 36 mm[Hg]
Ao pk vel: 3 m/s
Area-P 1/2: 4.39 cm2
P 1/2 time: 568 ms
S' Lateral: 2.8 cm

## 2023-06-04 LAB — C-REACTIVE PROTEIN: CRP: 1 mg/dL — ABNORMAL HIGH (ref <1.0)

## 2023-06-04 LAB — LIPID PANEL
Cholesterol: 116 mg/dL (ref 0–200)
HDL: 27 mg/dL — ABNORMAL LOW (ref >40)
LDL Cholesterol: 38 mg/dL (ref 0–99)
Total CHOL/HDL Ratio: 4.3 {ratio}
Triglycerides: 255 mg/dL — ABNORMAL HIGH (ref <150)
VLDL: 51 mg/dL — ABNORMAL HIGH (ref 0–40)

## 2023-06-04 LAB — TSH: TSH: 1.184 u[IU]/mL (ref 0.350–4.500)

## 2023-06-04 LAB — GLUCOSE, CAPILLARY
Glucose-Capillary: 104 mg/dL — ABNORMAL HIGH (ref 70–99)
Glucose-Capillary: 116 mg/dL — ABNORMAL HIGH (ref 70–99)
Glucose-Capillary: 135 mg/dL — ABNORMAL HIGH (ref 70–99)
Glucose-Capillary: 104 mg/dL — ABNORMAL HIGH (ref 70–99)

## 2023-06-04 LAB — HEMOGLOBIN A1C
Hgb A1c MFr Bld: 5.5 % (ref 4.8–5.6)
Mean Plasma Glucose: 111.15 mg/dL

## 2023-06-04 LAB — HIV ANTIBODY (ROUTINE TESTING W REFLEX): HIV Screen 4th Generation wRfx: NONREACTIVE

## 2023-06-04 LAB — TROPONIN I (HIGH SENSITIVITY): Troponin I (High Sensitivity): 7 ng/L (ref <18)

## 2023-06-04 LAB — SEDIMENTATION RATE: Sed Rate: 2 mm/h (ref 0–20)

## 2023-06-04 MED ORDER — IRBESARTAN 75 MG PO TABS
37.5000 mg | ORAL_TABLET | Freq: Every day | ORAL | Status: DC
  Administered 2023-06-04: 37.5 mg via ORAL
  Filled 2023-06-04: qty 0.5

## 2023-06-04 MED ORDER — ASPIRIN 81 MG PO TBEC: 81.0000 mg | DELAYED_RELEASE_TABLET | Freq: Every day | ORAL | 12 refills | Status: AC

## 2023-06-04 MED ORDER — ACETAMINOPHEN 650 MG RE SUPP: 650.0000 mg | RECTAL | Status: DC | PRN

## 2023-06-04 MED ORDER — VITAMIN D (ERGOCALCIFEROL) 1.25 MG (50000 UNIT) PO CAPS
50000.0000 [IU] | ORAL_CAPSULE | ORAL | Status: DC
  Administered 2023-06-04: 50000 [IU] via ORAL
  Filled 2023-06-04: qty 1

## 2023-06-04 MED ORDER — IOHEXOL 350 MG/ML SOLN
75.0000 mL | Freq: Once | INTRAVENOUS | Status: AC | PRN
  Administered 2023-06-04: 75 mL via INTRAVENOUS

## 2023-06-04 MED ORDER — STROKE: EARLY STAGES OF RECOVERY BOOK: Freq: Once | Status: DC

## 2023-06-04 MED ORDER — ROSUVASTATIN CALCIUM 20 MG PO TABS: 20.0000 mg | ORAL_TABLET | Freq: Every day | ORAL | 1 refills | Status: DC

## 2023-06-04 MED ORDER — ROSUVASTATIN CALCIUM 10 MG PO TABS
10.0000 mg | ORAL_TABLET | Freq: Every day | ORAL | Status: DC
  Administered 2023-06-04: 10 mg via ORAL
  Filled 2023-06-04: qty 1

## 2023-06-04 MED ORDER — ENOXAPARIN SODIUM 40 MG/0.4ML IJ SOSY
40.0000 mg | PREFILLED_SYRINGE | INTRAMUSCULAR | Status: DC
  Filled 2023-06-04: qty 0.4

## 2023-06-04 MED ORDER — INSULIN ASPART 100 UNIT/ML IJ SOLN
0.0000 [IU] | INTRAMUSCULAR | Status: DC
  Administered 2023-06-04: 1 [IU] via SUBCUTANEOUS
  Filled 2023-06-04: qty 1

## 2023-06-04 MED ORDER — DIVALPROEX SODIUM 250 MG PO DR TAB: 250.0000 mg | DELAYED_RELEASE_TABLET | Freq: Two times a day (BID) | ORAL | 0 refills | Status: DC

## 2023-06-04 MED ORDER — TRAZODONE HCL 50 MG PO TABS: 25.0000 mg | ORAL_TABLET | Freq: Every evening | ORAL | Status: DC | PRN

## 2023-06-04 MED ORDER — ACETAMINOPHEN 160 MG/5ML PO SOLN: 650.0000 mg | ORAL | Status: DC | PRN

## 2023-06-04 MED ORDER — DIVALPROEX SODIUM 250 MG PO DR TAB: 250.0000 mg | DELAYED_RELEASE_TABLET | Freq: Two times a day (BID) | ORAL | Status: DC

## 2023-06-04 MED ORDER — SODIUM CHLORIDE 0.9 % IV SOLN: INTRAVENOUS | Status: DC

## 2023-06-04 MED ORDER — CYCLOBENZAPRINE HCL 10 MG PO TABS: 5.0000 mg | ORAL_TABLET | Freq: Every day | ORAL | Status: DC | PRN

## 2023-06-04 MED ORDER — CLOPIDOGREL BISULFATE 75 MG PO TABS: 75.0000 mg | ORAL_TABLET | Freq: Every day | ORAL | 0 refills | Status: AC

## 2023-06-04 MED ORDER — DIAZEPAM 5 MG PO TABS: 5.0000 mg | ORAL_TABLET | Freq: Four times a day (QID) | ORAL | Status: DC | PRN

## 2023-06-04 MED ORDER — DIVALPROEX SODIUM 250 MG PO DR TAB
250.0000 mg | DELAYED_RELEASE_TABLET | Freq: Two times a day (BID) | ORAL | Status: DC
  Filled 2023-06-04: qty 1

## 2023-06-04 MED ORDER — CLOPIDOGREL BISULFATE 75 MG PO TABS
300.0000 mg | ORAL_TABLET | Freq: Once | ORAL | Status: AC
  Administered 2023-06-04: 300 mg via ORAL
  Filled 2023-06-04: qty 4

## 2023-06-04 MED ORDER — ASPIRIN 81 MG PO TBEC
81.0000 mg | DELAYED_RELEASE_TABLET | Freq: Every day | ORAL | Status: DC
  Administered 2023-06-04: 81 mg via ORAL
  Filled 2023-06-04: qty 1

## 2023-06-04 MED ORDER — CLOPIDOGREL BISULFATE 75 MG PO TABS
75.0000 mg | ORAL_TABLET | Freq: Every day | ORAL | Status: DC
  Administered 2023-06-04: 75 mg via ORAL
  Filled 2023-06-04: qty 1

## 2023-06-04 MED ORDER — MAGNESIUM HYDROXIDE 400 MG/5ML PO SUSP: 30.0000 mL | Freq: Every day | ORAL | Status: DC | PRN

## 2023-06-04 MED ORDER — ASPIRIN 81 MG PO CHEW
324.0000 mg | CHEWABLE_TABLET | Freq: Once | ORAL | Status: AC
  Administered 2023-06-04: 324 mg via ORAL
  Filled 2023-06-04: qty 4

## 2023-06-04 MED ORDER — METOPROLOL SUCCINATE ER 25 MG PO TB24
25.0000 mg | ORAL_TABLET | Freq: Every day | ORAL | Status: DC
  Administered 2023-06-04: 25 mg via ORAL
  Filled 2023-06-04: qty 1

## 2023-06-04 MED ORDER — ONDANSETRON HCL 4 MG/2ML IJ SOLN: 4.0000 mg | INTRAMUSCULAR | Status: DC | PRN

## 2023-06-04 MED ORDER — ACETAMINOPHEN 325 MG PO TABS: 650.0000 mg | ORAL_TABLET | ORAL | Status: DC | PRN

## 2023-06-04 NOTE — Evaluation (Signed)
Physical Therapy Evaluation Patient Details Name: Keith Barber MRN: [id number] DOB: 15-Mar-1971 Today's Date: 06/04/2023  History of Present Illness  Pt is a 52 yo male that presented to ED for abnormal MRI, complaints of vision changes and disorientation MRI showed: Likely subacute enhancing infarcts in the posterior right temporal  lobe and right occipital lobe as well as the right thalamus (right PCA territory). PMH of GERD, migraines, HLD.   Clinical Impression  Pt A&Ox4, family at beside. Stated at baseline he is independent, works for himself ([employer]), no falls. Upon assessment the pt demosntrated symmetrical and strong BUE and BLE MMT, denied sensation/coordination deficits, and gait/transfers independent. Pt's main complaint of vision fluctuates, is sometimes the R eye, sometimes L, sometimes both and that low light is helpful. Described his vision as "hazy." Difficulty reading menu, and occlusion did not help. The patient demonstrated and reported return to baseline level of functioning in regards to mobility, no further acute PT needs indicated. PT to sign off. Please reconsult PT if pt status changes or acute needs are identified.           If plan is discharge home, recommend the following: Assist for transportation   Can travel by private vehicle        Equipment Recommendations None recommended by PT  Recommendations for Other Services       Functional Status Assessment Patient has not had a recent decline in their functional status     Precautions / Restrictions Restrictions Weight Bearing Restrictions: No      Mobility  Bed Mobility Overal bed mobility: Independent                  Transfers Overall transfer level: Independent                      Ambulation/Gait Ambulation/Gait assistance: Independent Gait Distance (Feet): 2 Feet           General Gait Details: pt deferred true ambulation but able to take several  steps in room forwards/backwards without complaints/concerns  Stairs            Wheelchair Mobility     Tilt Bed    Modified Rankin (Stroke Patients Only)       Balance Overall balance assessment: Independent                                           Pertinent Vitals/Pain Pain Assessment Pain Assessment: No/denies pain    Home Living Family/patient expects to be discharged to:: Private residence Living Arrangements: Spouse/significant other;Children Available Help at Discharge: Family Type of Home: House Home Access: Stairs to enter;Ramped entrance   Entrance Stairs-Number of Steps: 4   Home Layout: One level Home Equipment: Agricultural consultant (2 wheels);Cane - single point;Shower seat;BSC/3in1;Wheelchair - manual      Prior Function Prior Level of Function : Independent/Modified Independent             Mobility Comments: works for himself; 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       Extremity/Trunk Assessment   Upper Extremity Assessment Upper Extremity Assessment: Overall WFL for tasks assessed (reported chronic L shoulder strength deficits due to previous injuries/surgeries)    Lower Extremity Assessment Lower Extremity Assessment: Overall WFL for tasks assessed    Cervical / Trunk Assessment Cervical / Trunk Assessment: Normal  Communication  Cognition Arousal: Alert Behavior During Therapy: WFL for tasks assessed/performed Overall Cognitive Status: Within Functional Limits for tasks assessed                                          General Comments      Exercises     Assessment/Plan    PT Assessment Patient does not need any further PT services  PT Problem List         PT Treatment Interventions      PT Goals (Current goals can be found in the Care Plan section)       Frequency       Co-evaluation               AM-PAC PT "6 Clicks" Mobility  Outcome Measure Help needed turning from your back to  your side while in a flat bed without using bedrails?: None Help needed moving from lying on your back to sitting on the side of a flat bed without using bedrails?: None Help needed moving to and from a bed to a chair (including a wheelchair)?: None Help needed standing up from a chair using your arms (e.g., wheelchair or bedside chair)?: None Help needed to walk in hospital room?: None Help needed climbing 3-5 steps with a railing? : None 6 Click Score: 24    End of Session   Activity Tolerance: Patient tolerated treatment well Patient left: in bed;with call bell/phone within reach;with family/visitor present   PT Visit Diagnosis: Other symptoms and signs involving the nervous system (R29.898)    Time: 8315-1761 PT Time Calculation (min) (ACUTE ONLY): 14 min   Charges:   PT Evaluation $PT Eval Low Complexity: 1 Low   PT General Charges $$ ACUTE PT VISIT: 1 Visit        Olga Coaster PT, DPT 8:40 AM,06/04/23

## 2023-06-04 NOTE — Discharge Instructions (Addendum)
Pt advised to f/u Neurology Dr Cristopher Peru when appt is called in  . I have sent out pt referral for it  F/u with Ophthalmologist on your appt on Monday. No driving till cleared by your eye doctor

## 2023-06-04 NOTE — Assessment & Plan Note (Signed)
-   We will continue his Valium and hold off Elavil.

## 2023-06-04 NOTE — Assessment & Plan Note (Signed)
-   This is actually subacute and involves the posterior right temporal lobe and right occipital lobe as well as right thalamus and possibly left frontal cortex. - The patient will be admitted to a medically monitored bed.   - We will follow neuro checks q.4 hours for 24 hours.   - The patient will be placed on aspirin and Plavix.   - I ordered a head and neck CTA as mentioned above.  Will obtain 2D echo with bubble study. - A neurology consultation  as well as physical/occupation/speech therapy consults will be obtained in a.m..  I notified Dr. Iver Nestle about the patient - The patient will be placed on statin therapy and fasting lipids will be checked.

## 2023-06-04 NOTE — Evaluation (Signed)
Occupational Therapy Evaluation Patient Details Name: Keith Barber MRN: [id number] DOB: 1971-06-28 Today's Date: 06/04/2023   History of Present Illness Pt is a 52 yo male that presented to ED for abnormal MRI, complaints of vision changes and disorientation MRI showed: Likely subacute enhancing infarcts in the posterior right temporal  lobe and right occipital lobe as well as the right thalamus (right PCA territory). PMH of GERD, migraines, HLD.   Clinical Impression   Pt was seen for OT evaluation this date. 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 Pt presents to acute OT demonstrating independence with mobility and ADL performance and no visual deficits at time of evaluation (See OT problem list for additional detail). Pt endorses having "cloudy" or "hazy" vision in R or L eye intermittently, "hit or miss." Pt also notes he is much more light sensitive now. No double vision, loss of vision, or other deficits appreciated. Pt encouraged to seek outpatient OT vision specialists should his symptoms persist. Pt notes that it already seems to be getting better with less headaches as well. No acute OT needs at this time. Will sign off. Pt in agreement.     If plan is discharge home, recommend the following: Assist for transportation    Functional Status Assessment  Patient has not had a recent decline in their functional status  Equipment Recommendations  None recommended by OT    Recommendations for Other Services       Precautions / Restrictions Precautions Precautions: None Restrictions Weight Bearing Restrictions: No      Mobility Bed Mobility Overal bed mobility: Independent                  Transfers Overall transfer level: Independent                        Balance Overall balance assessment: Independent                                         ADL either performed or assessed  with clinical judgement   ADL Overall ADL's : Independent                                             Vision Baseline Vision/History: 1 Wears glasses Ability to See in Adequate Light: 0 Adequate Patient Visual Report: Blurring of vision Additional Comments: Pt noting intermittent R or L side blurry vision or "cloudy" vision, "hit or miss" and pt noting he is much more light sensitive. No difficulty reading/seeing clearly near and far, no double vision or loss of vision and denies blurriness at time of evaluation.     Perception Perception: Within Functional Limits       Praxis Praxis: WFL       Pertinent Vitals/Pain Pain Assessment Pain Assessment: No/denies pain     Extremity/Trunk Assessment Upper Extremity Assessment Upper Extremity Assessment: Overall WFL for tasks assessed (reported chronic L shoulder strength deficits due to previous injuries/surgeries)   Lower Extremity Assessment Lower Extremity Assessment: Overall WFL for tasks assessed   Cervical / Trunk Assessment Cervical / Trunk Assessment: Normal   Communication Communication Communication: No apparent difficulties   Cognition Arousal: Alert Behavior During Therapy: WFL for tasks assessed/performed Overall  Cognitive Status: Within Functional Limits for tasks assessed                                       General Comments       Exercises     Shoulder Instructions      Home Living Family/patient expects to be discharged to:: Private residence Living Arrangements: Spouse/significant other;Children Available Help at Discharge: Family Type of Home: House Home Access: Stairs to enter;Ramped entrance Entrance Stairs-Number of Steps: 4   Home Layout: One level               Home Equipment: Agricultural consultant (2 wheels);Cane - single point;Shower seat;BSC/3in1;Wheelchair - manual          Prior Functioning/Environment Prior Level of Function :  Independent/Modified Independent             Mobility Comments: works for himself; 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          OT Problem List: Impaired vision/perception      OT Treatment/Interventions:      OT Goals(Current goals can be found in the care plan section) Acute Rehab OT Goals Patient Stated Goal: be independent OT Goal Formulation: All assessment and education complete, DC therapy  OT Frequency:      Co-evaluation              AM-PAC OT "6 Clicks" Daily Activity     Outcome Measure Help from another person eating meals?: None Help from another person taking care of personal grooming?: None Help from another person toileting, which includes using toliet, bedpan, or urinal?: None Help from another person bathing (including washing, rinsing, drying)?: None Help from another person to put on and taking off regular upper body clothing?: None Help from another person to put on and taking off regular lower body clothing?: None 6 Click Score: 24   End of Session    Activity Tolerance: Patient tolerated treatment well Patient left: Other (comment) (in transport chair to be taken for ultrasound)  OT Visit Diagnosis: Low vision, both eyes (H54.2)                Time: 1610-9604 OT Time Calculation (min): 12 min Charges:  OT General Charges $OT Visit: 1 Visit OT Evaluation $OT Eval Low Complexity: 1 Low  Arman Filter., MPH, MS, OTR/L ascom [phone] 06/04/23, 2:41 PM

## 2023-06-04 NOTE — Progress Notes (Signed)
  Echocardiogram 2D Echocardiogram has been performed. A Saline Microcavitation (Bubbly Study) was requested and performed today.  Lenor Coffin 06/04/2023, 12:20 PM

## 2023-06-04 NOTE — H&P (Signed)
[place]   PATIENT NAME: Keith Barber    MR#:  [id number]  DATE OF BIRTH:  [birth year]  DATE OF ADMISSION:  06/03/2023  PRIMARY CARE PHYSICIAN: Larae Grooms, NP   Patient is coming from: Home  REQUESTING/REFERRING PHYSICIAN: Chiquita Loth, MD  CHIEF COMPLAINT:  Blurred vision and abnormal brain MRI  HISTORY OF PRESENT ILLNESS:  Keith Barber is a 52 y.o. Caucasian male with medical history significant for hypertension, sleep edema, migraine OSA, who presented to the emergency room due to abnormal brain MRI.  The patient has been having 1 week history of blurry vision as well as disorientation and mild confusion.  He admits to occasional left hand numbness.  He denies any other paresthesias or focal muscle weakness.  No dysarthria or dysphagia.  No tinnitus or vertigo.  No chest pain or palpitations.  No cough or wheezing or dyspnea.  No presyncope or syncope.  He stated that he donated plasma about 61 times since August of this year.  His brain MRI showed likely subacute enhancing infarcts in the posterior right temporal lobe and right occipital lobe as well as the right thalamus (right PCA territory).  ED Course: When he came to the ER, BP was 136/115 with heart rate of 55 with otherwise normal vital signs.  Labs revealed unremarkable CMP and CBC showed hemoconcentration.  Coagulation profile was within normal.  Alcohol level was less than 10. EKG as reviewed by me : EKG showed normal sinus rhythm with rate of 95. Imaging: CT of the head and neck revealed the following: 1. No emergent vascular finding. 2. There is mild atherosclerosis without irregularity or flow reducing stenosis of major arteries in the head and neck. 3. On prior brain MRI there is question of an additional punctate left frontal cortex infarct, consider central embolic disease.  The patient was given for review aspirin 300 mg p.o. Plavix.  He will be admitted to a medical telemetry bed for further  evaluation and management. PAST MEDICAL HISTORY:   Past Medical History:  Diagnosis Date   Bone spur    LEFT SHOULDER THAT MAKES HIS LEFT ARM TINGLE - had removed   Diverticulitis large intestine 09/2017   GERD (gastroesophageal reflux disease)    Hemorrhoids    Hyperlipidemia    Hypertension    Migraines    rare migraines   Sleep apnea     PAST SURGICAL HISTORY:   Past Surgical History:  Procedure Laterality Date   55 HOUR PH STUDY N/A 05/30/2018   Procedure: 24 HOUR PH STUDY;  Surgeon: Pasty Spillers, MD;  Location: [facility] ENDOSCOPY;  Service: Gastroenterology;  Laterality: N/A;   bicep tendon tear      COLONOSCOPY WITH PROPOFOL N/A 02/01/2018   Procedure: COLONOSCOPY WITH PROPOFOL;  Surgeon: Pasty Spillers, MD;  Location: [facility] ENDOSCOPY;  Service: Endoscopy;  Laterality: N/A;   COLONOSCOPY WITH PROPOFOL N/A 08/30/2022   Procedure: COLONOSCOPY WITH PROPOFOL;  Surgeon: Wyline Mood, MD;  Location: [facility] ENDOSCOPY;  Service: Gastroenterology;  Laterality: N/A;   ESOPHAGEAL MANOMETRY N/A 05/30/2018   Procedure: ESOPHAGEAL MANOMETRY (EM);  Surgeon: Pasty Spillers, MD;  Location: [facility] ENDOSCOPY;  Service: Gastroenterology;  Laterality: N/A;   ESOPHAGOGASTRODUODENOSCOPY (EGD) WITH PROPOFOL N/A 02/01/2018   Procedure: ESOPHAGOGASTRODUODENOSCOPY (EGD) WITH PROPOFOL;  Surgeon: Pasty Spillers, MD;  Location: [facility] ENDOSCOPY;  Service: Endoscopy;  Laterality: N/A;   INGUINAL HERNIA REPAIR Bilateral 01/21/2017   Procedure: LAPAROSCOPIC BILATERAL INGUINAL HERNIA REPAIR;  Surgeon: Everlene Farrier,  Merri Ray, MD;  Location: [facility] ORS;  Service: General;  Laterality: Bilateral;   KNEE SURGERY Left    SHOULDER ARTHROSCOPY WITH DISTAL CLAVICLE RESECTION Left 07/05/2017   Procedure: LEFT SHOULDER ARTHROSCOPY, SUBACROMIAL DECOMPRESSION, DISTAL CLAVICLE RESECTION, POSSIBLE MINI OPEN ROTATOR CUFF REPAIR;  Surgeon: Valeria Batman, MD;  Location: [facility] OR;  Service: Orthopedics;  Laterality:  Left;   SHOULDER CLOSED REDUCTION Left 12/13/2017   Procedure: LEFT SHOULDER MANIPULATION with steroid injection;  Surgeon: Valeria Batman, MD;  Location: [facility] OR;  Service: Orthopedics;  Laterality: Left;   SHOULDER SURGERY Right    UMBILICAL HERNIA REPAIR N/A 01/21/2017   Procedure: HERNIA REPAIR UMBILICAL ADULT;  Surgeon: Leafy Ro, MD;  Location: [facility] ORS;  Service: General;  Laterality: N/A;    SOCIAL HISTORY:   Social History   Tobacco Use   Smoking status: Former    Current packs/day: 0.00    Average packs/day: 0.5 packs/day for 26.0 years (13.0 ttl pk-yrs)    Types: Cigarettes    Start date: 01/14/1991    Quit date: 01/13/2017    Years since quitting: 6.3   Smokeless tobacco: Never  Substance Use Topics   Alcohol use: No    FAMILY HISTORY:   Family History  Problem Relation Age of Onset   Diabetes Mother    Migraines Mother    Lung cancer Father    Lung cancer Paternal Grandfather    Cancer Neg Hx    COPD Neg Hx    Heart disease Neg Hx    Stroke Neg Hx    Sleep apnea Neg Hx     DRUG ALLERGIES:   Allergies  Allergen Reactions   Amlodipine Itching    REVIEW OF SYSTEMS:   ROS As per history of present illness. All pertinent systems were reviewed above. Constitutional, HEENT, cardiovascular, respiratory, GI, GU, musculoskeletal, neuro, psychiatric, endocrine, integumentary and hematologic systems were reviewed and are otherwise negative/unremarkable except for positive findings mentioned above in the HPI.   MEDICATIONS AT HOME:   Prior to Admission medications   Medication Sig Start Date End Date Taking? Authorizing Provider  Accu-Chek Softclix Lancets lancets 1 each by Other route in the morning and at bedtime. Use as instructed 11/18/22   Larae Grooms, NP  amitriptyline (ELAVIL) 10 MG tablet Take 1 tablet (10 mg total) by mouth at bedtime. 02/28/23 04/29/23  Pearley, Sherran Needs, NP  Blood Glucose Monitoring Suppl (ACCU-CHEK AVIVA PLUS)  w/Device KIT 1 each by Does not apply route in the morning and at bedtime. 11/18/22   Larae Grooms, NP  cyclobenzaprine (FLEXERIL) 5 MG tablet Take 1 tablet (5 mg total) by mouth daily as needed for muscle spasms. 02/28/23   Pearley, Sherran Needs, NP  diazepam (VALIUM) 5 MG tablet Take 1 tablet (5 mg total) by mouth every 6 (six) hours as needed for anxiety. Take 1 tablet 1 hour prior to procedure and repeat with second tablet when walking into imaging. 06/01/23   Larae Grooms, NP  glucose blood (ACCU-CHEK AVIVA PLUS) test strip 1 each by Other route in the morning and at bedtime. Use as instructed 11/18/22   Larae Grooms, NP  metFORMIN (GLUCOPHAGE) 500 MG tablet TAKE 2 TABLETS BY MOUTH TWICE DAILY WITH MEALS 04/18/23   Larae Grooms, NP  metoprolol succinate (TOPROL-XL) 25 MG 24 hr tablet Take 1 tablet by mouth once daily 04/11/23   Larae Grooms, NP  naproxen sodium (ALEVE) 220 MG tablet Take 2 tablets by mouth daily.  [provider]  rizatriptan (MAXALT-MLT) 10 MG disintegrating tablet Take 1 tablet (10 mg total) by mouth as needed for migraine. May repeat in 2 hours if needed 05/29/23   Defelice, Para March, NP  rosuvastatin (CRESTOR) 10 MG tablet Take 1 tablet (10 mg total) by mouth daily. 03/31/23   Mecum, Oswaldo Conroy, PA-C  TRULICITY 3 MG/0.5ML SOAJ INJECT 3MG  ONCE A WEEK AS DIRECTED 05/13/23   Larae Grooms, NP  valsartan (DIOVAN) 40 MG tablet Take 1 tablet by mouth once daily 03/08/23   Mecum, Erin E, PA-C  Vitamin D, Ergocalciferol, (DRISDOL) 1.25 MG (50000 UNIT) CAPS capsule Take 1 capsule (50,000 Units total) by mouth every 7 (seven) days. 11/02/22   Johnson, Megan P, DO      VITAL SIGNS:  Blood pressure (!) 121/90, pulse 93, temperature 97.6 F (36.4 C), temperature source Oral, resp. rate 16, height 5\' 8"  (1.727 m), weight 89.1 kg, SpO2 98%.  PHYSICAL EXAMINATION:  Physical Exam  GENERAL:  52 y.o.-year-old patient lying in the bed with no acute distress.   EYES: Pupils equal, round, reactive to light and accommodation. No scleral icterus. Extraocular muscles intact.  HEENT: Head atraumatic, normocephalic. Oropharynx and nasopharynx clear.  NECK:  Supple, no jugular venous distention. No thyroid enlargement, no tenderness.  LUNGS: Normal breath sounds bilaterally, no wheezing, rales,rhonchi or crepitation. No use of accessory muscles of respiration.  CARDIOVASCULAR: Regular rate and rhythm, S1, S2 normal. No murmurs, rubs, or gallops.  ABDOMEN: Soft, nondistended, nontender. Bowel sounds present. No organomegaly or mass.  EXTREMITIES: No pedal edema, cyanosis, or clubbing.  NEUROLOGIC: Cranial nerves II through XII are intact. Muscle strength 5/5 in all extremities. Sensation intact. Gait not checked.  PSYCHIATRIC: The patient is alert and oriented x 3.  Normal affect and good eye contact. SKIN: No obvious rash, lesion, or ulcer.   LABORATORY PANEL:   CBC Recent Labs  Lab 06/03/23 1841  WBC 10.2  HGB 17.6*  HCT 52.6*  PLT 213   ------------------------------------------------------------------------------------------------------------------  Chemistries  Recent Labs  Lab 06/03/23 1841  NA 138  K 3.9  CL 108  CO2 24  GLUCOSE 102*  BUN 19  CREATININE 1.00  CALCIUM 8.5*  AST 25  ALT 26  ALKPHOS 74  BILITOT 0.7   ------------------------------------------------------------------------------------------------------------------  Cardiac Enzymes No results for input(s): "TROPONINI" in the last 168 hours. ------------------------------------------------------------------------------------------------------------------  RADIOLOGY:  CT ANGIO HEAD NECK W WO CM  Result Date: 06/04/2023 CLINICAL DATA:  Stroke, determine embolic source EXAM: CT ANGIOGRAPHY HEAD AND NECK WITH AND WITHOUT CONTRAST TECHNIQUE: Multidetector CT imaging of the head and neck was performed using the standard protocol during bolus administration of  intravenous contrast. Multiplanar CT image reconstructions and MIPs were obtained to evaluate the vascular anatomy. Carotid stenosis measurements (when applicable) are obtained utilizing NASCET criteria, using the distal internal carotid diameter as the denominator. RADIATION DOSE REDUCTION: This exam was performed according to the departmental dose-optimization program which includes automated exposure control, adjustment of the mA and/or kV according to patient size and/or use of iterative reconstruction technique. CONTRAST:  75mL OMNIPAQUE IOHEXOL 350 MG/ML SOLN COMPARISON:  Brain MRI from yesterday FINDINGS: CT HEAD FINDINGS Brain: Right PCA distribution infarcts are subtle compared to MRI. There is also a small left frontal cortex acute infarct suspected by MRI. No acute hemorrhage, hydrocephalus, or evidence of progressive ischemia. Vascular: See below Skull: Normal. Negative for fracture or focal lesion. Sinuses/Orbits: No acute finding. Review of the MIP images confirms the above findings CTA NECK  FINDINGS Aortic arch: Atheromatous plaque with 4 vessel branching. Right carotid system: Mild atheromatous calcification and low-density wall thickening. No stenosis or ulceration. Left carotid system: Mild intimal atheromatous thickening. Vertebral arteries: The left vertebral artery arises from the arch and is slightly non dominant. No subclavian stenosis. Both vertebral arteries are smoothly contoured and widely patent to the dura. Skeleton: Negative. Other neck: Unremarkable Upper chest: Clear apical lungs Review of the MIP images confirms the above findings CTA HEAD FINDINGS Anterior circulation: No significant stenosis, proximal occlusion, aneurysm, or vascular malformation. Posterior circulation: No significant stenosis, proximal occlusion, aneurysm, or vascular malformation. Venous sinuses: As permitted by contrast timing, patent. Anatomic variants: None significant Review of the MIP images confirms the  above findings IMPRESSION: 1. No emergent vascular finding. 2. There is mild atherosclerosis without irregularity or flow reducing stenosis of major arteries in the head and neck. 3. On prior brain MRI there is question of an additional punctate left frontal cortex infarct, consider central embolic disease. Electronically Signed   By: Tiburcio Pea M.D.   On: 06/04/2023 05:10   MR BRAIN W WO CONTRAST  Result Date: 06/03/2023 CLINICAL DATA:  Headache, neuro deficit Confusion and blurred vision EXAM: MRI HEAD WITHOUT AND WITH CONTRAST TECHNIQUE: Multiplanar, multiecho pulse sequences of the brain and surrounding structures were obtained without and with intravenous contrast. CONTRAST:  8 mL of Vueway COMPARISON:  CT head June 30, 21. FINDINGS: Brain: Multiple areas restricted diffusion the posterior right temporal lobe and right occipital lobe as well as the right thalamus (right PCA territory), with areas of associated curvilinear enhancement. Associated restricted diffusion and edema. No midline shift. No acute hemorrhage or hydrocephalus. Remote left cerebellar infarct. Vascular: Major arterial flow voids are maintained at the skull base. Skull and upper cervical spine: Normal marrow signal. Sinuses/Orbits: Clear sinuses.  No acute orbital findings. 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 These results will be called to the ordering clinician or representative by the Radiologist Assistant, and communication documented in the PACS or [employer]. Electronically Signed   By: Feliberto Harts M.D.   On: 06/03/2023 14:03      IMPRESSION AND PLAN:  Assessment and Plan: * Acute CVA (cerebrovascular accident) (HCC) - This is actually subacute and involves the posterior right temporal lobe and right occipital lobe as well as right thalamus and possibly left frontal cortex. - The  patient will be admitted to a medically monitored bed.   - We will follow neuro checks q.4 hours for 24 hours.   - The patient will be placed on aspirin and Plavix.   - I ordered a head and neck CTA as mentioned above.  Will obtain 2D echo with bubble study. - A neurology consultation  as well as physical/occupation/speech therapy consults will be obtained in a.m..  I notified Dr. Iver Nestle about the patient - The patient will be placed on statin therapy and fasting lipids will be checked.   Essential hypertension - We will continue antihypertensive therapy.  Dyslipidemia - We will continue therapy and check fasting lipids.  Type 2 diabetes mellitus without complications (HCC) - The patient will be placed on supplement coverage with NovoLog. - We will hold off metformin.  Anxiety and depression - We will continue his Valium and hold off Elavil.    DVT prophylaxis: Lovenox.  Advanced Care Planning:  Code Status: full code.  Family Communication:  The plan of care was discussed in details with the patient (and family). I answered all questions. The patient agreed to proceed with the above mentioned plan. Further management will depend upon hospital course. Disposition Plan: Back to previous home environment Consults called: Neurology All the records are reviewed and case discussed with ED provider.  Status is: Inpatient    At the time of the admission, it appears that the appropriate admission status for this patient is inpatient.  This is judged to be reasonable and necessary in order to provide the required intensity of service to ensure the patient's safety given the presenting symptoms, physical exam findings and initial radiographic and laboratory data in the context of comorbid conditions.  The patient requires inpatient status due to high intensity of service, high risk of further deterioration and high frequency of surveillance required.  I certify that at the time of  admission, it is my clinical judgment that the patient will require inpatient hospital care extending more than 2 midnights.                            Dispo: The patient is from: Home              Anticipated d/c is to: Home              Patient currently is not medically stable to d/c.              Difficult to place patient: No  Hannah Beat M.D on 06/04/2023 at 8:04 AM  [facility]   From 7 PM-7 AM, contact night-coverage [email or web address]  CC: Primary care physician; Larae Grooms, NP

## 2023-06-04 NOTE — Plan of Care (Signed)
Pt arrived this shift. AxO4. RA. Tele- ST. No complaints of pain. Ind in room. NIH Complete. Bilateral vision blurriness.      Problem: Education: Goal: Knowledge of General Education information will improve Description: Including pain rating scale, medication(s)/side effects and non-pharmacologic comfort measures Outcome: Progressing   Problem: Health Behavior/Discharge Planning: Goal: Ability to manage health-related needs will improve Outcome: Progressing   Problem: Clinical Measurements: Goal: Ability to maintain clinical measurements within normal limits will improve Outcome: Progressing Goal: Will remain free from infection Outcome: Progressing Goal: Diagnostic test results will improve Outcome: Progressing Goal: Respiratory complications will improve Outcome: Progressing Goal: Cardiovascular complication will be avoided Outcome: Progressing   Problem: Activity: Goal: Risk for activity intolerance will decrease Outcome: Progressing   Problem: Nutrition: Goal: Adequate nutrition will be maintained Outcome: Progressing   Problem: Coping: Goal: Level of anxiety will decrease Outcome: Progressing   Problem: Elimination: Goal: Will not experience complications related to bowel motility Outcome: Progressing Goal: Will not experience complications related to urinary retention Outcome: Progressing   Problem: Pain Management: Goal: General experience of comfort will improve Outcome: Progressing   Problem: Safety: Goal: Ability to remain free from injury will improve Outcome: Progressing   Problem: Skin Integrity: Goal: Risk for impaired skin integrity will decrease Outcome: Progressing   Problem: Clinical Measurements: Goal: Diagnostic test results will improve Outcome: Progressing   Problem: Coping: Goal: Level of anxiety will decrease Outcome: Progressing   Problem: Pain Management: Goal: General experience of comfort will improve Outcome:  Progressing   Problem: Safety: Goal: Ability to remain free from injury will improve Outcome: Progressing

## 2023-06-04 NOTE — Consult Note (Signed)
NEUROLOGY CONSULT NOTE   Date of service: June 04, 2023 Patient Name: Keith Barber MRN:  [id number] DOB:  August 16, 1970 Chief Complaint: "Migraine, stroke on MRI" Requesting Provider: [name], MD  History of Present Illness  Keith Barber is a 52 y.o. man with a past medical history significant for hypertension, hyperlipidemia, well-controlled diabetes, migraine headaches, sleep apnea not on CPAP   Presented to PCP on 05/30/2023 for 4 days of migraine that was intractable, having taken 4 of Maxalt without relief as well as Advil migraine; MRI was ordered which when obtained on 11/15 demonstrated subacute stroke for which neurology was consulted. Per PCP notes he gave plasma at noon and then 1:30 PM when he went to the bathroom started sweating, to the degree that he needed to change his shirt, had diarrhea when he got to the toilet, felt like he was going to fall, with persistent symptoms since and pain with coughing as well as confusion and blurred vision.  Patient corroborates the story to me today; confirming that he felt like his headache was his normal migraine headache, except that he typically gets an aura that since this event he has not been having an aura but just intermittent left-sided blurry vision lasting 5 to 30 minutes at a time.  He denies any other focal neurological deficits, cardiac symptoms, or systemic symptoms, including no concern for fevers, leg swelling, leg pain  He has ophthalmology follow-up in place for Monday at 8 AM and PCP follow-up Tuesday and would strongly prefer to be discharged as soon as possible today  He reports he has stopped using his CPAP for the last several months due to noting increased canker sores when he is using it.  Otherwise he is adherent with his medications.   LKW: 11/08  Modified rankin score: 0-Completely asymptomatic and back to baseline post- stroke IV Thrombolysis: No out of the window EVT: No exam not c/w LVO NIHSS  0   ROS  Comprehensive ROS performed and pertinent positives documented in HPI    Past History   Past Medical History:  Diagnosis Date   Bone spur    LEFT SHOULDER THAT MAKES HIS LEFT ARM TINGLE - had removed   Diverticulitis large intestine 09/2017   GERD (gastroesophageal reflux disease)    Hemorrhoids    Hyperlipidemia    Hypertension    Migraines    rare migraines   Sleep apnea     Past Surgical History:  Procedure Laterality Date   42 HOUR PH STUDY N/A 05/30/2018   Procedure: 24 HOUR PH STUDY;  Surgeon: Pasty Spillers, MD;  Location: [facility];  Service: Gastroenterology;  Laterality: N/A;   bicep tendon tear      COLONOSCOPY WITH PROPOFOL N/A 02/01/2018   Procedure: COLONOSCOPY WITH PROPOFOL;  Surgeon: Pasty Spillers, MD;  Location: [facility] END[facility];  Service: Endoscopy;  Laterality: N/A;   COLONOSCOPY WITH PROPOFOL N/A 08/30/2022   Procedure: COLONOSCOPY WITH PROPOFOL;  Surgeon: Wyline Mood, MD;  Location: [facility] END[facility]COPY;  Service: Gastroenterology;  Laterality: N/A;   ESOPHAGEAL MANOMETRY N/A 05/30/2018   Procedure: ESOPHAGEAL MANOMETRY (EM);  Surgeon: Pasty Spillers, MD;  Location: [facility] ENDOSCOPY;  Service: Gastroenterology;  Laterality: N/A;   ESOPHAGOGASTRODUODENOSCOPY (EGD) WITH PROPOFOL N/A 02/01/2018   Procedure: ESOPHAGOGASTRODUODENOSCOPY (EGD) WITH PROPOFOL;  Surgeon: Pasty Spillers, MD;  Location: [facility] END[facility];  Service: Endoscopy;  Laterality: N/A;   INGUINAL HERNIA REPAIR Bilateral 01/21/2017   Procedure: LAPAROSCOPIC BILATERAL INGUINAL HERNIA REPAIR;  Surgeon: Everlene Farrier,  Merri Ray, MD;  Location: [facility] ORS;  Service: General;  Laterality: Bilateral;   KNEE SURGERY Left    SHOULDER ARTHROSCOPY WITH DISTAL CLAVICLE RESECTION Left 07/05/2017   Procedure: LEFT SHOULDER ARTHROSCOPY, SUBACROMIAL DECOMPRESSION, DISTAL CLAVICLE RESECTION, POSSIBLE MINI OPEN ROTATOR CUFF REPAIR;  Surgeon: Valeria Batman, MD;  Location: [facility] OR;  Service:  Orthopedics;  Laterality: Left;   SHOULDER CLOSED REDUCTION Left 12/13/2017   Procedure: LEFT SHOULDER MANIPULATION with steroid injection;  Surgeon: Valeria Batman, MD;  Location: [facility] OR;  Service: Orthopedics;  Laterality: Left;   SHOULDER SURGERY Right    UMBILICAL HERNIA REPAIR N/A 01/21/2017   Procedure: HERNIA REPAIR UMBILICAL ADULT;  Surgeon: Leafy Ro, MD;  Location: [facility] OR[facility];  Service: General;  Laterality: N/A;    Family History: Family History  Problem Relation Age of Onset   Diabetes Mother    Migraines Mother    Lung cancer Father    Lung cancer Paternal Grandfather    Cancer Neg Hx    COPD Neg Hx    Heart disease Neg Hx    Stroke Neg Hx    Sleep apnea Neg Hx   He denies any family history of autoimmune disease  Social History  reports that he quit smoking about 6 years ago. His smoking use included cigarettes. He started smoking about 32 years ago. He has a 13 pack-year smoking history. He has never used smokeless tobacco. He reports that he does not drink alcohol and does not use drugs.  Allergies  Allergen Reactions   Amlodipine Itching    Medications   Current Facility-Administered Medications:    [START ON 06/05/2023]  stroke: early stages of recovery book, , Does not apply, Once, Mansy, Jan A, MD   acetaminophen (TYLENOL) tablet 650 mg, 650 mg, Oral, Q4H PRN **OR** acetaminophen (TYLENOL) 160 MG/5ML solution 650 mg, 650 mg, Per Tube, Q4H PRN **OR** acetaminophen (TYLENOL) suppository 650 mg, 650 mg, Rectal, Q4H PRN, Mansy, Jan A, MD   aspirin EC tablet 81 mg, 81 mg, Oral, Daily, Mansy, Jan A, MD, 81 mg at 06/04/23 1113   clopidogrel (PLAVIX) tablet 75 mg, 75 mg, Oral, Daily, Mansy, Jan A, MD, 75 mg at 06/04/23 1113   cyclobenzaprine (FLEXERIL) tablet 5 mg, 5 mg, Oral, Daily PRN, Mansy, Jan A, MD   enoxaparin (LOVENOX) injection 40 mg, 40 mg, Subcutaneous, Q24H, Mansy, Jan A, MD   insulin aspart (novoLOG) injection 0-9 Units, 0-9 Units, Subcutaneous,  Q4H, Mansy, Jan A, MD, 1 Units at 06/04/23 1149   irbesartan (AVAPRO) tablet 37.5 mg, 37.5 mg, Oral, Daily, Mansy, Jan A, MD, 37.5 mg at 06/04/23 1113   magnesium hydroxide (MILK OF MAGNESIA) suspension 30 mL, 30 mL, Oral, Daily PRN, Mansy, Jan A, MD   metoprolol succinate (TOPROL-XL) 24 hr tablet 25 mg, 25 mg, Oral, Daily, Mansy, Jan A, MD, 25 mg at 06/04/23 1113   ondansetron (ZOFRAN) injection 4 mg, 4 mg, Intravenous, Q4H PRN, Mansy, Jan A, MD   rosuvastatin (CRESTOR) tablet 10 mg, 10 mg, Oral, Daily, Mansy, Jan A, MD, 10 mg at 06/04/23 1113   traZODone (DESYREL) tablet 25 mg, 25 mg, Oral, QHS PRN, Mansy, Jan A, MD   Vitamin D (Ergocalciferol) (DRISDOL) 1.25 MG (50000 UNIT) capsule 50,000 Units, 50,000 Units, Oral, Q7 days, Mansy, Jan A, MD, 50,000 Units at 06/04/23 1114  Vitals   Vitals:   06/04/23 0059 06/04/23 0126 06/04/23 0836 06/04/23 1121  BP: (!) 136/115 (!) 121/90 121/88 117/84  Pulse: Marland Kitchen)  55 93 83 92  Resp: 16 16 16 18   Temp: 97.9 F (36.6 C) 97.6 F (36.4 C) 97.7 F (36.5 C) 98 F (36.7 C)  TempSrc: Oral Oral    SpO2: 100% 98% 98% 97%  Weight: 87.5 kg 89.1 kg    Height: 5\' 8"  (1.727 m) 5\' 8"  (1.727 m)      Body mass index is 29.86 kg/m.  Physical Exam   Constitutional: Appears well-developed and well-nourished.  Psych: Affect somewhat anxious but calm and cooperative Eyes: No scleral injection.  HENT: No OP obstruction.  Head: Normocephalic. Cardiovascular: Normal rate and regular rhythm.  Respiratory: Effort normal, non-labored breathing.  GI: Soft.  No distension. There is no tenderness.  Skin: Warm dry and intact visible skin.  No swelling of the lower extremities  Neurologic Examination   Neuro: Mental Status: Patient is awake, alert, oriented to person, place, month, year, and situation. Patient is able to give a clear and coherent history; at times tangential No signs of aphasia or neglect Cranial Nerves: II: Visual Fields are full. Pupils are  equal, round, and reactive to light.   III,IV, VI: EOMI without ptosis or diploplia.  V: Facial sensation is symmetric to temperature VII: Facial movement is symmetric.  VIII: hearing is intact to voice X: Uvula elevates symmetrically XI: Shoulder shrug is symmetric. XII: tongue is midline without atrophy or fasciculations.  Motor: Tone is normal. Bulk is normal. 5/5 strength was present in all four extremities.  Sensory: Sensation is symmetric to light touch and temperature in the arms and legs. Deep Tendon Reflexes: 2+ and symmetric in the biceps and patellae.  Brisk Plantars: Toes are downgoing bilaterally.  Cerebellar: FNF and HKS are intact bilaterally Gait: Able to rise on heels and toes, normal casual gait.  Negative Romberg   Labs/Imaging/Neurodiagnostic studies   CBC:  Recent Labs  Lab 06/23/2023 1841  WBC 10.2  NEUTROABS 6.0  HGB 17.6*  HCT 52.6*  MCV 89.2  PLT 213   Basic Metabolic Panel:  Lab Results  Component Value Date   NA 138 06/23/2023   K 3.9 06-23-2023   CO2 24 June 23, 2023   GLUCOSE 102 (H) 2023/06/23   BUN 19 06/23/2023   CREATININE 1.00 June 23, 2023   CALCIUM 8.5 (L) 06-23-23   GFRNONAA >60 2023-06-23   GFRAA >60 02/05/2020   Lipid Panel:  Lab Results  Component Value Date   CHOL 116 06/04/2023   HDL 27 (L) 06/04/2023   LDLCALC 38 06/04/2023   TRIG 255 (H) 06/04/2023   CHOLHDL 4.3 06/04/2023       VLDL 51 (high; 0 - 40 mg/dL ref range)  [id number]:  Lab Results  Component Value Date   HGBA1C 5.5 06/23/2023   Urine Drug Screen:     Component Value Date/Time   LABOPIA NONE DETECTED 01/16/2020 1954   COCAINSCRNUR NONE DETECTED 01/16/2020 1954   LABBENZ NONE DETECTED 01/16/2020 1954   AMPHETMU NONE DETECTED 01/16/2020 1954   THCU NONE DETECTED 01/16/2020 1954   LABBARB NONE DETECTED 01/16/2020 1954    Alcohol Level     Component Value Date/Time   ETH <10 23-Jun-2023 1841   INR  Lab Results  Component Value Date   INR 1.0  06/23/23   APTT  Lab Results  Component Value Date   APTT 25 06/23/23   CT angio Head and Neck with contrast(Personally reviewed): 1. No emergent vascular finding. 2. There is mild atherosclerosis without irregularity or flow reducing stenosis of major arteries in  the head and neck. 3. On prior brain MRI there is question of an additional punctate left frontal cortex infarct, consider central embolic disease.  MRI Brain(Personally reviewed): 1. No emergent vascular finding. 2. There is mild atherosclerosis without irregularity or flow reducing stenosis of major arteries in the head and neck. 3. On prior brain MRI there is question of an additional punctate left frontal cortex infarct, consider central embolic disease.  ECHO Prelim read: normal LV and RV size and function. Moderate, possibly moderate to severe aortic valve stenosis, valve is very calcified.    ASSESSMENT   Although this young patient has multiple stroke risk factors and they are all well-controlled other than his sleep apnea (and hypertriglyceridemia, elevated VLDL). He is eager for discharge today but willing to stay for labs with plans to follow-up for further workup with his outpatient providers  RECOMMENDATIONS  # Left PCA stroke, embolic of undetermined source - Hypercoag panel, ESR, CRP, TSH, RPR, (HIV pending) - Lower extremity duplex to assess for any DVT - Referral to cardiology for TEE and loop recorder placement; if loop recorder cannot be arranged would at minimum do a Holter monitor - Transcranial Doppler with bubble study to more sensitively detect PFO (more sensitive than echocardiogram with bubble); not available at [facility] will need to be arranged outpatient and follow up with cardiology for consideration of PFO closure if it is positive - Appreciate further management of his VLDL and triglycerides per PCP/cardiology; no stroke specific guidelines but these would expect to increase his risk of  stroke - Aspirin 81 mg daily lifelong - Continue Plavix 75 mg daily for 21-day course - Risk factor modification, CPAP adherence discussed with patient as well as diet and exercise - Blood pressure goal normotension given he is out of the permissive hypertension window - No driving until cleared by ophthalmology due to potential visual field deficit (though none on my bedside exam)  # Migraine headaches exacerbated in the setting of stroke - Initiate Depakote 250 mg twice daily, after 1 week could increase to 500 mg twice daily if he is tolerating it well - Depakote level should be checked after 1 month - Patient counseled not to use triptans more than 9 doses a month (Maxalt) - Okay to continue magnesium daily - Patient counseled not to overuse Tylenol and NSAIDs (no more than 3 doses a day, no more than 3 days a week) ______________________________________________________________________   Nunzio Cory MD-PhD [facility] [phone]  Discussed with Dr. Allena Katz via secure chat

## 2023-06-04 NOTE — Assessment & Plan Note (Signed)
-   We will continue anti-hypertensive therapy. 

## 2023-06-04 NOTE — Assessment & Plan Note (Signed)
-   We will continue therapy and check fasting lipids.

## 2023-06-04 NOTE — Progress Notes (Signed)
SLP Cancellation Note  Patient Details Name: Keith Barber MRN: [id number] DOB: [birth year]   Cancelled treatment:       Reason Eval/Treat Not Completed: SLP screened, no needs identified, will sign off (chart reviewed; consulted NSG then met w/ pt in room.)  Pt denied any difficulty swallowing and is currently on a regular diet; tolerates swallowing pills w/ water per NSG.  Pt conversed in full conversation w/out overt expressive/receptive deficits noted; pt denied any speech-language deficits. Speech clear, intelligible. Noted lengthy conversation on phone w/ family upon entering room. No further skilled ST services indicated as pt appears at his communication baseline. Pt agreed. NSG to reconsult if any change in status while admitted.     Jerilynn Som, MS, CCC-SLP Speech Language Pathologist Rehab Services; [facility] [phone] (ascom) Chastity Noland 06/04/2023, 11:57 AM

## 2023-06-04 NOTE — Assessment & Plan Note (Signed)
-   The patient will be placed on supplement coverage with NovoLog. - We will hold off metformin. 

## 2023-06-05 LAB — RPR: RPR Ser Ql: NONREACTIVE

## 2023-06-05 NOTE — Discharge Summary (Signed)
Physician Discharge Summary   Patient: Keith Barber MRN: [id number] DOB: 1971/03/09  Admit date:     06/03/2023  Discharge date: 06/04/2023  Discharge Physician: [name]   PCP: Larae Grooms, NP   Recommendations at discharge:   follow-up PCP in 1 to 2 weeks follow-up with [facility] cardiology Dr. Mariah Milling for TEE and loop recorder placement  Discharge Diagnoses: Principal Problem:   Acute CVA (cerebrovascular accident) [facility]) Active Problems:   Essential hypertension   Dyslipidemia   Type 2 diabetes mellitus without complications (HCC)   Anxiety and depression  CT angio Head and Neck with contrast(Personally reviewed): 1. No emergent vascular finding. 2. There is mild atherosclerosis without irregularity or flow reducing stenosis of major arteries in the head and neck. 3. On prior brain MRI there is question of an additional punctate left frontal cortex infarct, consider central embolic disease.   MRI Brain(Personally reviewed): 1. No emergent vascular finding. 2. There is mild atherosclerosis without irregularity or flow reducing stenosis of major arteries in the head and neck. 3. On prior brain MRI there is question of an additional punctate left frontal cortex infarct, consider central embolic disease.   ECHO Prelim read: normal LV and RV size and function. Moderate, possibly moderate to severe aortic valve stenosis, valve is very calcified.    Keith Barber is a 52 y.o. Caucasian male with medical history significant for hypertension, sleep edema, migraine OSA, who presented to the emergency room due to abnormal brain MRI.  The patient has been having 1 week history of blurry vision as well as disorientation and mild confusion.  He admits to occasional left hand numbness.    Left PCA stroke, embolic of undetermined source  -- patient was seen by neurology. Hyper coagulopathy workup was send outpatient follow-up with PCP as outpatient -- lower extremity  Doppler negative. -- Echo resulted as above. -- Patient is referred to cardiology for PE and loop recorder placement. Dr. Mariah Milling from [facility] will follow-up patient. He was notified. -- Continue aspirin daily lifelong -- continue Plavix 75 mg daily for 21 day. -- Follow-up ophthalmology as outpatient  Migraine headaches -- patient started on Depakote 250 mg daily BID and if able to tolerate increase to 500 mg BID per neurology -- okay to use magnesium daily.  Type II diabetes without complication -- resume metformin from Monday  Dyslipidemia -- started on Crestor in the setting of stroke  Stroke workup completed with some outpatient follow-up with cardiology and neurology along with PCP for labs  Was discussed with patient voice understanding will discharge to home.      Consultants: neurology   Diet recommendation:   DISCHARGE MEDICATION: Allergies as of 06/04/2023       Reactions   Amlodipine Itching        Medication List     TAKE these medications    Accu-Chek Aviva Plus test strip Generic drug: glucose blood 1 each by Other route in the morning and at bedtime. Use as instructed   Accu-Chek Aviva Plus w/Device Kit 1 each by Does not apply route in the morning and at bedtime.   Accu-Chek Softclix Lancets lancets 1 each by Other route in the morning and at bedtime. Use as instructed   amitriptyline 10 MG tablet Commonly known as: ELAVIL Take 1 tablet (10 mg total) by mouth at bedtime.   aspirin EC 81 MG tablet Take 1 tablet (81 mg total) by mouth daily. Swallow whole.   clopidogrel 75 MG  tablet Commonly known as: PLAVIX Take 1 tablet (75 mg total) by mouth daily for 20 days.   cyclobenzaprine 5 MG tablet Commonly known as: FLEXERIL Take 1 tablet (5 mg total) by mouth daily as needed for muscle spasms.   diazepam 5 MG tablet Commonly known as: Valium Take 1 tablet (5 mg total) by mouth every 6 (six) hours as needed for anxiety. Take 1 tablet 1 hour  prior to procedure and repeat with second tablet when walking into imaging.   divalproex 250 MG DR tablet Commonly known as: DEPAKOTE Take 1 tablet (250 mg total) by mouth 2 (two) times daily. Increase to 500 mg bid after 1 week if you tolerate it   metFORMIN 500 MG tablet Commonly known as: GLUCOPHAGE TAKE 2 TABLETS BY MOUTH TWICE DAILY WITH MEALS   metoprolol succinate 25 MG 24 hr tablet Commonly known as: TOPROL-XL Take 1 tablet by mouth once daily   naproxen sodium 220 MG tablet Commonly known as: ALEVE Take 2 tablets by mouth daily.   rizatriptan 10 MG disintegrating tablet Commonly known as: MAXALT-MLT Take 1 tablet (10 mg total) by mouth as needed for migraine. May repeat in 2 hours if needed   rosuvastatin 20 MG tablet Commonly known as: CRESTOR Take 1 tablet (20 mg total) by mouth daily. What changed:  medication strength how much to take   Trulicity 3 MG/0.5ML Soaj Generic drug: Dulaglutide INJECT 3MG  ONCE A WEEK AS DIRECTED   valsartan 40 MG tablet Commonly known as: DIOVAN Take 1 tablet by mouth once daily   Vitamin D (Ergocalciferol) 1.25 MG (50000 UNIT) Caps capsule Commonly known as: DRISDOL Take 1 capsule (50,000 Units total) by mouth every 7 (seven) days.        Follow-up Information     Gollan, Tollie Pizza, MD. Schedule an appointment as soon as possible for a visit in 1 week(s).   Specialty: Cardiology Why: for TEE and loop recorder placement with h/o Acute CVA Contact information: [address] [place] [address] [phone]         Larae Grooms, NP. Schedule an appointment as soon as possible for a visit in 1 week(s).   Specialty: Nurse Practitioner Contact information: [address] [place] [address] [phone]         Lonell Face, MD. Go to.   Specialty: Neurology Why: when you get your appt. if you do not hear from thier office in 7-10 days call them and schedule f/u appt Contact  information: [address] [facility] [place] [address] [phone]                Discharge Exam: Ceasar Mons Weights   06/03/23 1839 06/04/23 0059 06/04/23 0126  Weight: 88 kg 87.5 kg 89.1 kg   Alert and oriented times three cardiovascular S1 S2 normal respiratory clear to auscultation neuro- grossly nonfocal  Condition at discharge: fair  The results of significant diagnostics from this hospitalization (including imaging, microbiology, ancillary and laboratory) are listed below for reference.   Imaging Studies: US Venous Img Lower Bilateral (DVT)  Result Date: 06/04/2023 CLINICAL DATA:  Bilateral lower extremity pain. History of smoking. Evaluate for DVT. EXAM: BILATERAL LOWER EXTREMITY VENOUS DOPPLER ULTRASOUND TECHNIQUE: Gray-scale sonography with graded compression, as well as color Doppler and duplex ultrasound were performed to evaluate the lower extremity deep venous systems from the level of the common femoral vein and including the common femoral, femoral, profunda femoral, popliteal and calf  veins including the posterior tibial, peroneal and gastrocnemius veins when visible. The superficial great saphenous vein was also interrogated. Spectral Doppler was utilized to evaluate flow at rest and with distal augmentation maneuvers in the common femoral, femoral and popliteal veins. COMPARISON:  None Available. FINDINGS: RIGHT LOWER EXTREMITY Common Femoral Vein: No evidence of thrombus. Normal compressibility, respiratory phasicity and response to augmentation. Saphenofemoral Junction: No evidence of thrombus. Normal compressibility and flow on color Doppler imaging. Profunda Femoral Vein: No evidence of thrombus. Normal compressibility and flow on color Doppler imaging. Femoral Vein: No evidence of thrombus. Normal compressibility, respiratory phasicity and response to augmentation. Popliteal Vein: No evidence of thrombus. Normal compressibility,  respiratory phasicity and response to augmentation. Calf Veins: No evidence of thrombus. Normal compressibility and flow on color Doppler imaging. Superficial Great Saphenous Vein: No evidence of thrombus. Normal compressibility. Other Findings:  None. LEFT LOWER EXTREMITY Common Femoral Vein: No evidence of thrombus. Normal compressibility, respiratory phasicity and response to augmentation. Saphenofemoral Junction: No evidence of thrombus. Normal compressibility and flow on color Doppler imaging. Profunda Femoral Vein: No evidence of thrombus. Normal compressibility and flow on color Doppler imaging. Femoral Vein: No evidence of thrombus. Normal compressibility, respiratory phasicity and response to augmentation. Popliteal Vein: No evidence of thrombus. Normal compressibility, respiratory phasicity and response to augmentation. Calf Veins: No evidence of thrombus. Normal compressibility and flow on color Doppler imaging. Superficial Great Saphenous Vein: No evidence of thrombus. Normal compressibility. Other Findings:  None. IMPRESSION: No evidence of DVT within either lower extremity. Electronically Signed   By: Simonne Come M.D.   On: 06/04/2023 17:06   ECHOCARDIOGRAM COMPLETE BUBBLE STUDY  Result Date: 06/04/2023    ECHOCARDIOGRAM REPORT   Patient Name:   Keith Barber [facility] Date of Exam: 06/04/2023 Medical Rec #:  [id number]            Height:       68.0 in Accession #:    [id number]           Weight:       196.4 lb Date of Birth:  06/20/1971             BSA:          2.028 m Patient Age:    52 years             BP:           121/90 mmHg Patient Gender: M                    HR:           92 bpm. Exam Location:  [facility] Procedure: 2D Echo and Saline Contrast Bubble Study Indications:     Stroke I63.9  History:         Patient has no prior history of Echocardiogram examinations.  Sonographer:     Overton Mam RDCS, FASE Referring Phys:  [id number] Cienna Dumais Diagnosing Phys: Julien Nordmann MD IMPRESSIONS  1. Left  ventricular ejection fraction, by estimation, is 60 to 65%. The left ventricle has normal function. The left ventricle has no regional wall motion abnormalities. There is mild left ventricular hypertrophy. Left ventricular diastolic parameters are consistent with Grade I diastolic dysfunction (impaired relaxation).  2. Right ventricular systolic function is normal. The right ventricular size is normal. Tricuspid regurgitation signal is inadequate for assessing PA pressure.  3. The mitral valve is normal in structure. No evidence of mitral valve regurgitation. No evidence of mitral stenosis.  4. The aortic valve is normal in structure. There is moderate calcification of the aortic valve. Aortic valve regurgitation is not visualized. Moderate aortic valve stenosis. Aortic valve area, by VTI measures 1.20 cm. Aortic valve mean gradient measures 23.0 mmHg. Aortic valve Vmax measures 3.00 m/s.  5. The inferior vena cava is normal in size with greater than 50% respiratory variability, suggesting right atrial pressure of 3 mmHg.  6. Agitated saline contrast bubble study was negative, with no evidence of any interatrial shunt. FINDINGS  Left Ventricle: Left ventricular ejection fraction, by estimation, is 60 to 65%. The left ventricle has normal function. The left ventricle has no regional wall motion abnormalities. The left ventricular internal cavity size was normal in size. There is  mild left ventricular hypertrophy. Left ventricular diastolic parameters are consistent with Grade I diastolic dysfunction (impaired relaxation). Right Ventricle: The right ventricular size is normal. No increase in right ventricular wall thickness. Right ventricular systolic function is normal. Tricuspid regurgitation signal is inadequate for assessing PA pressure. Left Atrium: Left atrial size was normal in size. Right Atrium: Right atrial size was normal in size. Pericardium: There is no evidence of pericardial effusion. Mitral Valve:  The mitral valve is normal in structure. No evidence of mitral valve regurgitation. No evidence of mitral valve stenosis. Tricuspid Valve: The tricuspid valve is normal in structure. Tricuspid valve regurgitation is not demonstrated. No evidence of tricuspid stenosis. Aortic Valve: The aortic valve is normal in structure. There is moderate calcification of the aortic valve. Aortic valve regurgitation is not visualized. Aortic regurgitation PHT measures 568 msec. Moderate aortic stenosis is present. Aortic valve mean gradient measures 23.0 mmHg. Aortic valve peak gradient measures 36.0 mmHg. Aortic valve area, by VTI measures 1.20 cm. Pulmonic Valve: The pulmonic valve was normal in structure. Pulmonic valve regurgitation is not visualized. No evidence of pulmonic stenosis. Aorta: The aortic root is normal in size and structure. Venous: The inferior vena cava is normal in size with greater than 50% respiratory variability, suggesting right atrial pressure of 3 mmHg. IAS/Shunts: No atrial level shunt detected by color flow Doppler. Agitated saline contrast was given intravenously to evaluate for intracardiac shunting. Agitated saline contrast bubble study was negative, with no evidence of any interatrial shunt. There  is no evidence of a patent foramen ovale. There is no evidence of an atrial septal defect.  LEFT VENTRICLE PLAX 2D LVIDd:         4.10 cm   Diastology LVIDs:         2.80 cm   LV e' medial:    6.42 cm/s LV PW:         1.20 cm   LV E/e' medial:  11.3 LV IVS:        1.30 cm   LV e' lateral:   12.30 cm/s LVOT diam:     2.30 cm   LV E/e' lateral: 5.9 LV SV:         75 LV SV Index:   37 LVOT Area:     4.15 cm  RIGHT VENTRICLE RV Basal diam:  3.20 cm RV S prime:     13.60 cm/s TAPSE (M-mode): 2.0 cm LEFT ATRIUM             Index        RIGHT ATRIUM           Index LA diam:        2.80 cm 1.38 cm/m   RA Area:  19.70 cm LA Vol (A2C):   21.3 ml 10.50 ml/m  RA Volume:   50.90 ml  25.10 ml/m LA Vol (A4C):    25.7 ml 12.67 ml/m LA Biplane Vol: 24.4 ml 12.03 ml/m  AORTIC VALVE                     PULMONIC VALVE AV Area (Vmax):    1.33 cm      PV Vmax:        1.00 m/s AV Area (Vmean):   1.19 cm      PV Peak grad:   4.0 mmHg AV Area (VTI):     1.20 cm      RVOT Peak grad: 3 mmHg AV Vmax:           300.20 cm/s AV Vmean:          224.600 cm/s AV VTI:            0.624 m AV Peak Grad:      36.0 mmHg AV Mean Grad:      23.0 mmHg LVOT Vmax:         95.80 cm/s LVOT Vmean:        64.100 cm/s LVOT VTI:          0.181 m LVOT/AV VTI ratio: 0.29 AI PHT:            568 msec  AORTA Ao Root diam: 3.70 cm Ao Asc diam:  3.70 cm MITRAL VALVE MV Area (PHT): 4.39 cm     SHUNTS MV Decel Time: 173 msec     Systemic VTI:  0.18 m MV E velocity: 72.80 cm/s   Systemic Diam: 2.30 cm MV A velocity: 113.00 cm/s MV E/A ratio:  0.64 Julien Nordmann MD Electronically signed by Julien Nordmann MD Signature Date/Time: 06/04/2023/3:49:18 PM    Final    CT ANGIO HEAD NECK W WO CM  Result Date: 06/04/2023 CLINICAL DATA:  Stroke, determine embolic source EXAM: CT ANGIOGRAPHY HEAD AND NECK WITH AND WITHOUT CONTRAST TECHNIQUE: Multidetector CT imaging of the head and neck was performed using the standard protocol during bolus administration of intravenous contrast. Multiplanar CT image reconstructions and MIPs were obtained to evaluate the vascular anatomy. Carotid stenosis measurements (when applicable) are obtained utilizing NASCET criteria, using the distal internal carotid diameter as the denominator. RADIATION DOSE REDUCTION: This exam was performed according to the departmental dose-optimization program which includes automated exposure control, adjustment of the mA and/or kV according to patient size and/or use of iterative reconstruction technique. CONTRAST:  75mL OMNIPAQUE IOHEXOL 350 MG/ML SOLN COMPARISON:  Brain MRI from yesterday FINDINGS: CT HEAD FINDINGS Brain: Right PCA distribution infarcts are subtle compared to MRI. There is also a  small left frontal cortex acute infarct suspected by MRI. No acute hemorrhage, hydrocephalus, or evidence of progressive ischemia. Vascular: See below Skull: Normal. Negative for fracture or focal lesion. Sinuses/Orbits: No acute finding. Review of the MIP images confirms the above findings CTA NECK FINDINGS Aortic arch: Atheromatous plaque with 4 vessel branching. Right carotid system: Mild atheromatous calcification and low-density wall thickening. No stenosis or ulceration. Left carotid system: Mild intimal atheromatous thickening. Vertebral arteries: The left vertebral artery arises from the arch and is slightly non dominant. No subclavian stenosis. Both vertebral arteries are smoothly contoured and widely patent to the dura. Skeleton: Negative. Other neck: Unremarkable Upper chest: Clear apical lungs Review of the MIP images confirms the above findings CTA HEAD FINDINGS Anterior circulation: No significant  stenosis, proximal occlusion, aneurysm, or vascular malformation. Posterior circulation: No significant stenosis, proximal occlusion, aneurysm, or vascular malformation. Venous sinuses: As permitted by contrast timing, patent. Anatomic variants: None significant Review of the MIP images confirms the above findings IMPRESSION: 1. No emergent vascular finding. 2. There is mild atherosclerosis without irregularity or flow reducing stenosis of major arteries in the head and neck. 3. On prior brain MRI there is question of an additional punctate left frontal cortex infarct, consider central embolic disease. Electronically Signed   By: Tiburcio Pea M.D.   On: 06/04/2023 05:10   MR BRAIN W WO CONTRAST  Result Date: 06/03/2023 CLINICAL DATA:  Headache, neuro deficit Confusion and blurred vision EXAM: MRI HEAD WITHOUT AND WITH CONTRAST TECHNIQUE: Multiplanar, multiecho pulse sequences of the brain and surrounding structures were obtained without and with intravenous contrast. CONTRAST:  8 mL of Vueway  COMPARISON:  CT head June 30, 21. FINDINGS: Brain: Multiple areas restricted diffusion the posterior right temporal lobe and right occipital lobe as well as the right thalamus (right PCA territory), with areas of associated curvilinear enhancement. Associated restricted diffusion and edema. No midline shift. No acute hemorrhage or hydrocephalus. Remote left cerebellar infarct. Vascular: Major arterial flow voids are maintained at the skull base. Skull and upper cervical spine: Normal marrow signal. Sinuses/Orbits: Clear sinuses.  No acute orbital findings. 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 These results will be called to the ordering clinician or representative by the Radiologist Assistant, and communication documented in the PACS or [employer]. Electronically Signed   By: Feliberto Harts M.D.   On: 06/03/2023 14:03    Microbiology: Results for orders placed or performed during the hospital encounter of 07/12/22  Resp panel by RT-PCR (RSV, Flu A&B, Covid) Anterior Nasal Swab     Status: Abnormal   Collection Time: 07/12/22 12:11 AM   Specimen: Anterior Nasal Swab  Result Value Ref Range Status   SARS Coronavirus 2 by RT PCR NEGATIVE NEGATIVE Final    Comment: (NOTE) SARS-CoV-2 target nucleic acids are NOT DETECTED.  The SARS-CoV-2 RNA is generally detectable in upper respiratory specimens during the acute phase of infection. The lowest concentration of SARS-CoV-2 viral copies this assay can detect is 138 copies/mL. A negative result does not preclude SARS-Cov-2 infection and should not be used as the sole basis for treatment or other patient management decisions. A negative result may occur with  improper specimen collection/handling, submission of specimen other than nasopharyngeal swab, presence of viral mutation(s) within the areas targeted by this assay, and  inadequate number of viral copies(<138 copies/mL). A negative result must be combined with clinical observations, patient history, and epidemiological information. The expected result is Negative.  Fact Sheet for Patients:  BloggerCourse.com  Fact Sheet for Healthcare Providers:  SeriousBroker.it  This test is no t yet approved or cleared by the [place] FDA and  has been authorized for detection and/or diagnosis of SARS-CoV-2 by FDA under an Emergency Use Authorization (EUA). This EUA will remain  in effect (meaning this test can be used) for the duration of the COVID-19 declaration under Section 564(b)(1) of the Act, 21 U.S.C.section 360bbb-3(b)(1), unless the authorization is terminated  or revoked sooner.       Influenza A by PCR POSITIVE (A) NEGATIVE Final   Influenza B by PCR NEGATIVE NEGATIVE Final    Comment: (NOTE) The Xpert Xpress SARS-CoV-2/FLU/RSV plus  assay is intended as an aid in the diagnosis of influenza from Nasopharyngeal swab specimens and should not be used as a sole basis for treatment. Nasal washings and aspirates are unacceptable for Xpert Xpress SARS-CoV-2/FLU/RSV testing.  Fact Sheet for Patients: BloggerCourse.com  Fact Sheet for Healthcare Providers: SeriousBroker.it  This test is not yet approved or cleared by the [place] FDA and has been authorized for detection and/or diagnosis of SARS-CoV-2 by FDA under an Emergency Use Authorization (EUA). This EUA will remain in effect (meaning this test can be used) for the duration of the COVID-19 declaration under Section 564(b)(1) of the Act, 21 U.S.C. section 360bbb-3(b)(1), unless the authorization is terminated or revoked.     Resp Syncytial Virus by PCR NEGATIVE NEGATIVE Final    Comment: (NOTE) Fact Sheet for Patients: BloggerCourse.com  Fact Sheet for  Healthcare Providers: SeriousBroker.it  This test is not yet approved or cleared by the [place] FDA and has been authorized for detection and/or diagnosis of SARS-CoV-2 by FDA under an Emergency Use Authorization (EUA). This EUA will remain in effect (meaning this test can be used) for the duration of the COVID-19 declaration under Section 564(b)(1) of the Act, 21 U.S.C. section 360bbb-3(b)(1), unless the authorization is terminated or revoked.  Performed at [facility], [address] [place], [place] [address]     Labs: CBC: Recent Labs  Lab 06/03/23 1841  WBC 10.2  NEUTROABS 6.0  HGB 17.6*  HCT 52.6*  MCV 89.2  PLT 213   Basic Metabolic Panel: Recent Labs  Lab 06/03/23 1841  NA 138  K 3.9  CL 108  CO2 24  GLUCOSE 102*  BUN 19  CREATININE 1.00  CALCIUM 8.5*   Liver Function Tests: Recent Labs  Lab 06/03/23 1841  AST 25  ALT 26  ALKPHOS 74  BILITOT 0.7  PROT 6.7  ALBUMIN 3.9   CBG: Recent Labs  Lab 06/04/23 0518 06/04/23 0818 06/04/23 1123 06/04/23 1529  GLUCAP 104* 116* 135* 104*    Discharge time spent: greater than 30 minutes.  Signed: [name], MD [facility] 06/05/2023

## 2023-06-06 ENCOUNTER — Telehealth: Payer: Self-pay

## 2023-06-06 DIAGNOSIS — H35033 Hypertensive retinopathy, bilateral: Secondary | ICD-10-CM | POA: Diagnosis not present

## 2023-06-06 DIAGNOSIS — E119 Type 2 diabetes mellitus without complications: Secondary | ICD-10-CM | POA: Diagnosis not present

## 2023-06-06 DIAGNOSIS — Z8673 Personal history of transient ischemic attack (TIA), and cerebral infarction without residual deficits: Secondary | ICD-10-CM | POA: Diagnosis not present

## 2023-06-06 LAB — HOMOCYSTEINE: Homocysteine: 12.6 umol/L (ref 0.0–14.5)

## 2023-06-06 LAB — LUPUS ANTICOAGULANT PANEL
DRVVT: 33.9 s (ref 0.0–47.0)
PTT Lupus Anticoagulant: 32.1 s (ref 0.0–43.5)

## 2023-06-06 LAB — BETA-2-GLYCOPROTEIN I ABS, IGG/M/A
Beta-2 Glyco I IgG: <9 GPI IgG units (ref 0–20)
Beta-2-Glycoprotein I IgA: <9 GPI IgA units (ref 0–25)
Beta-2-Glycoprotein I IgM: <9 GPI IgM units (ref 0–32)

## 2023-06-06 LAB — CARDIOLIPIN ANTIBODIES, IGG, IGM, IGA
Anticardiolipin IgA: <9 U/mL (ref 0–11)
Anticardiolipin IgG: <9 [GPL'U]/mL (ref 0–14)
Anticardiolipin IgM: <9 [MPL'U]/mL (ref 0–12)

## 2023-06-06 LAB — PROTEIN S ACTIVITY: Protein S Activity: 93 % (ref 63–140)

## 2023-06-06 LAB — PROTEIN S, TOTAL: Protein S Ag, Total: 75 % (ref 60–150)

## 2023-06-06 LAB — PROTEIN C ACTIVITY: Protein C Activity: 119 % (ref 73–180)

## 2023-06-06 NOTE — Telephone Encounter (Signed)
Patient was called on Friday and sent to the ER.  Please see result note.

## 2023-06-06 NOTE — Transitions of Care (Post Inpatient/ED Visit) (Signed)
   06/06/2023  Name: Keith Barber MRN: [id number] DOB: 1971/05/11  Today's TOC FU Call Status: Today's TOC FU Call Status:: Unsuccessful Call (1st Attempt) Unsuccessful Call (1st Attempt) Date: 06/06/23  Attempted to reach the patient regarding the most recent Inpatient/ED visit.  Follow Up Plan: Additional outreach attempts will be made to reach the patient to complete the Transitions of Care (Post Inpatient/ED visit) call.   Susa Loffler , BSN, RN Care Management Coordinator [place]   [facility] [email or web address] Direct Dial: [phone]

## 2023-06-07 ENCOUNTER — Other Ambulatory Visit: Payer: Self-pay

## 2023-06-07 ENCOUNTER — Encounter: Payer: Self-pay | Admitting: Nurse Practitioner

## 2023-06-07 ENCOUNTER — Telehealth: Payer: Self-pay

## 2023-06-07 ENCOUNTER — Telehealth (INDEPENDENT_AMBULATORY_CARE_PROVIDER_SITE_OTHER): Payer: 59 | Admitting: Nurse Practitioner

## 2023-06-07 VITALS — Ht 68.0 in | Wt 194.0 lb

## 2023-06-07 DIAGNOSIS — E785 Hyperlipidemia, unspecified: Secondary | ICD-10-CM | POA: Insufficient documentation

## 2023-06-07 DIAGNOSIS — G4730 Sleep apnea, unspecified: Secondary | ICD-10-CM | POA: Insufficient documentation

## 2023-06-07 DIAGNOSIS — I10 Essential (primary) hypertension: Secondary | ICD-10-CM | POA: Insufficient documentation

## 2023-06-07 DIAGNOSIS — I639 Cerebral infarction, unspecified: Secondary | ICD-10-CM | POA: Diagnosis not present

## 2023-06-07 DIAGNOSIS — I63531 Cerebral infarction due to unspecified occlusion or stenosis of right posterior cerebral artery: Secondary | ICD-10-CM | POA: Diagnosis not present

## 2023-06-07 DIAGNOSIS — G43009 Migraine without aura, not intractable, without status migrainosus: Secondary | ICD-10-CM | POA: Diagnosis not present

## 2023-06-07 DIAGNOSIS — M779 Enthesopathy, unspecified: Secondary | ICD-10-CM | POA: Insufficient documentation

## 2023-06-07 DIAGNOSIS — K649 Unspecified hemorrhoids: Secondary | ICD-10-CM | POA: Insufficient documentation

## 2023-06-07 LAB — PROTEIN C, TOTAL: Protein C, Total: 102 % (ref 60–150)

## 2023-06-07 NOTE — Progress Notes (Signed)
Ht 5\' 8"  (1.727 m)   Wt 194 lb (88 kg)   BMI 29.50 kg/m    Subjective:    Patient ID: Keith Barber, male    DOB: 06/14/1971, 52 y.o.   MRN: [id number]  HPI: Keith Barber is a 51 y.o. male  Chief Complaint  Patient presents with   Migraine    Would like to discuss MRI results, medication for migraines   Patient was admitted overnight in the hospital for observation and had several tests done.    He has follow up with Neurology this afternoon.  He was started on depakote for the migraines which he hasn't noticed migraines.  He has been trying to refrain from taking anything but he did take one tylenol this morning.  He has not been taking NSAIDS.  He is taking the baby ASA and Plavix.    He has follow up with Cardiology tomorrow.  Has plans to get a TEE and loop monitor.     Relevant past medical, surgical, family and social history reviewed and updated as indicated. Interim medical history since our last visit reviewed. Allergies and medications reviewed and updated.  Review of Systems  Neurological:  Positive for headaches.    Per HPI unless specifically indicated above     Objective:    Ht 5\' 8"  (1.727 m)   Wt 194 lb (88 kg)   BMI 29.50 kg/m   Wt Readings from Last 3 Encounters:  06/07/23 194 lb (88 kg)  06/04/23 196 lb 6.4 oz (89.1 kg)  05/30/23 195 lb 6.4 oz (88.6 kg)    Physical Exam Vitals and nursing note reviewed.  Constitutional:      General: He is not in acute distress.    Appearance: He is not ill-appearing.  HENT:     Head: Normocephalic.     Right Ear: Hearing normal.     Left Ear: Hearing normal.     Nose: Nose normal.  Pulmonary:     Effort: Pulmonary effort is normal. No respiratory distress.  Neurological:     Mental Status: He is alert.  Psychiatric:        Mood and Affect: Mood normal.        Behavior: Behavior normal.        Thought Content: Thought content normal.        Judgment: Judgment normal.     Results for  orders placed or performed during the hospital encounter of 06/03/23  Protime-INR  Result Value Ref Range   Prothrombin Time 13.0 11.4 - 15.2 seconds   INR 1.0 0.8 - 1.2  APTT  Result Value Ref Range   aPTT 25 24 - 36 seconds  CBC  Result Value Ref Range   WBC 10.2 4.0 - 10.5 K/uL   RBC 5.90 (H) 4.22 - 5.81 MIL/uL   Hemoglobin 17.6 (H) 13.0 - 17.0 g/dL   HCT 57.3 (H) 22.0 - 25.4 %   MCV 89.2 80.0 - 100.0 fL   MCH 29.8 26.0 - 34.0 pg   MCHC 33.5 30.0 - 36.0 g/dL   RDW 27.0 62.3 - 76.2 %   Platelets 213 150 - 400 K/uL   nRBC 0.0 0.0 - 0.2 %  Differential  Result Value Ref Range   Neutrophils Relative % 60 %   Neutro Abs 6.0 1.7 - 7.7 K/uL   Lymphocytes Relative 32 %   Lymphs Abs 3.2 0.7 - 4.0 K/uL   Monocytes Relative 7 %   Monocytes  Absolute 0.7 0.1 - 1.0 K/uL   Eosinophils Relative 1 %   Eosinophils Absolute 0.1 0.0 - 0.5 K/uL   Basophils Relative 0 %   Basophils Absolute 0.0 0.0 - 0.1 K/uL   Immature Granulocytes 0 %   Abs Immature Granulocytes 0.04 0.00 - 0.07 K/uL  Comprehensive metabolic panel  Result Value Ref Range   Sodium 138 135 - 145 mmol/L   Potassium 3.9 3.5 - 5.1 mmol/L   Chloride 108 98 - 111 mmol/L   CO2 24 22 - 32 mmol/L   Glucose, Bld 102 (H) 70 - 99 mg/dL   BUN 19 6 - 20 mg/dL   Creatinine, Ser 1.61 0.61 - 1.24 mg/dL   Calcium 8.5 (L) 8.9 - 10.3 mg/dL   Total Protein 6.7 6.5 - 8.1 g/dL   Albumin 3.9 3.5 - 5.0 g/dL   AST 25 15 - 41 U/L   ALT 26 0 - 44 U/L   Alkaline Phosphatase 74 38 - 126 U/L   Total Bilirubin 0.7 <1.2 mg/dL   GFR, Estimated >09 >60 mL/min   Anion gap 6 5 - 15  Ethanol  Result Value Ref Range   Alcohol, Ethyl (B) <10 <10 mg/dL  Hemoglobin A5W  Result Value Ref Range   Hgb A1c MFr Bld 5.5 4.8 - 5.6 %   Mean Plasma Glucose 111.15 mg/dL  HIV Antibody (routine testing w rflx)  Result Value Ref Range   HIV Screen 4th Generation wRfx Non Reactive Non Reactive  Lipid panel  Result Value Ref Range   Cholesterol 116 0 - 200  mg/dL   Triglycerides 098 (H) <150 mg/dL   HDL 27 (L) >11 mg/dL   Total CHOL/HDL Ratio 4.3 RATIO   VLDL 51 (H) 0 - 40 mg/dL   LDL Cholesterol 38 0 - 99 mg/dL  Glucose, capillary  Result Value Ref Range   Glucose-Capillary 104 (H) 70 - 99 mg/dL  Glucose, capillary  Result Value Ref Range   Glucose-Capillary 116 (H) 70 - 99 mg/dL  Glucose, capillary  Result Value Ref Range   Glucose-Capillary 135 (H) 70 - 99 mg/dL   Comment 1 Notify RN   Antithrombin III  Result Value Ref Range   AntiThromb III Func 124 (H) 75 - 120 %  Protein C activity  Result Value Ref Range   Protein C Activity 119 73 - 180 %  Protein S activity  Result Value Ref Range   Protein S Activity 93 63 - 140 %  Protein S, total  Result Value Ref Range   Protein S Ag, Total 75 60 - 150 %  Lupus anticoagulant panel  Result Value Ref Range   PTT Lupus Anticoagulant 32.1 0.0 - 43.5 sec   DRVVT 33.9 0.0 - 47.0 sec   Lupus Anticoag Interp Comment:   Beta-2-glycoprotein i abs, IgG/M/A  Result Value Ref Range   Beta-2 Glyco I IgG <9 0 - 20 GPI IgG units   Beta-2-Glycoprotein I IgM <9 0 - 32 GPI IgM units   Beta-2-Glycoprotein I IgA <9 0 - 25 GPI IgA units  Homocysteine, serum  Result Value Ref Range   Homocysteine 12.6 0.0 - 14.5 umol/L  Cardiolipin antibodies, IgG, IgM, IgA  Result Value Ref Range   Anticardiolipin IgG <9 0 - 14 GPL U/mL   Anticardiolipin IgM <9 0 - 12 MPL U/mL   Anticardiolipin IgA <9 0 - 11 APL U/mL  Sedimentation rate  Result Value Ref Range   Sed Rate 2 0 -  20 mm/hr  C-reactive protein  Result Value Ref Range   CRP 1.0 (H) <1.0 mg/dL  RPR  Result Value Ref Range   RPR Ser Ql NON REACTIVE NON REACTIVE  TSH  Result Value Ref Range   TSH 1.184 0.350 - 4.500 uIU/mL  Glucose, capillary  Result Value Ref Range   Glucose-Capillary 104 (H) 70 - 99 mg/dL  ECHOCARDIOGRAM COMPLETE BUBBLE STUDY  Result Value Ref Range   Ao pk vel 3.00 m/s   AV Area VTI 1.20 cm2   AR max vel 1.33 cm2   AV  Mean grad 23.0 mmHg   AV Peak grad 36.0 mmHg   S' Lateral 2.80 cm   AV Area mean vel 1.19 cm2   Area-P 1/2 4.39 cm2   P 1/2 time 568 msec   Est EF 60 - 65%   Troponin I (High Sensitivity)  Result Value Ref Range   Troponin I (High Sensitivity) 7 <18 ng/L      Assessment & Plan:   Problem List Items Addressed This Visit       Cardiovascular and Mediastinum   Acute CVA (cerebrovascular accident) (HCC) - Primary    New problem. Abnormal MRI on 06/04/23.  Was admitted for observation and testing.  ECHO showed moderate to severe with calcifications of the Aortic Valve.  He will have a TEE and Loop Monitor.  Has an appointment with Cardiology on 06/08/23.  He also has an appointment with Neurology today.  Has been taking the Depakote. Headaches are improving. He is using Tylenol PRN for pain control. Will defer to Neurology for management of Depakote. Continue with ASA and Plavix.  Will plan to increase Rosuvastatin to 40mg  at next visit.   Follow up in 2 months.  Call sooner if concerns arise.          Follow up plan: Return in about 2 months (around 08/07/2023) for HTN, HLD, DM2 FU.   This visit was completed via MyChart due to the restrictions of the COVID-19 pandemic. All issues as above were discussed and addressed. Physical exam was done as above through visual confirmation on MyChart. If it was felt that the patient should be evaluated in the office, they were directed there. The patient verbally consented to this visit. Location of the patient: Home Location of the provider: Office Those involved with this call:  Provider: Larae Grooms, NP CMA: Oswaldo Conroy, CMA Front Desk/Registration: Servando Snare This encounter was conducted via video.  I spent 20 dedicated to the care of this patient on the date of this encounter to include previsit review of symptoms, plan of care and follow up, face to face time with the patient, and post visit ordering of testing.

## 2023-06-07 NOTE — Assessment & Plan Note (Signed)
New problem. Abnormal MRI on 06/04/23.  Was admitted for observation and testing.  ECHO showed moderate to severe with calcifications of the Aortic Valve.  He will have a TEE and Loop Monitor.  Has an appointment with Cardiology on 06/08/23.  He also has an appointment with Neurology today.  Has been taking the Depakote. Headaches are improving. He is using Tylenol PRN for pain control. Will defer to Neurology for management of Depakote. Continue with ASA and Plavix.  Will plan to increase Rosuvastatin to 40mg  at next visit.   Follow up in 2 months.  Call sooner if concerns arise.

## 2023-06-07 NOTE — Transitions of Care (Post Inpatient/ED Visit) (Signed)
06/07/2023  Name: Keith Barber MRN: [id number] DOB: 10-Jul-1971  Today's TOC FU Call Status: Today's TOC FU Call Status:: Successful TOC FU Call Completed Unsuccessful Call (1st Attempt) Date: 06/06/23 [facility] FU Call Complete Date: 06/07/23 Patient's Name and Date of Birth confirmed.  Transition Care Management Follow-up Telephone Call Date of Discharge: 06/04/23 Discharge Facility: [facility]) Type of Discharge: Inpatient Admission Primary Inpatient Discharge Diagnosis:: Acute CVA How have you been since you were released from the hospital?: Better Any questions or concerns?: No  Items Reviewed: Did you receive and understand the discharge instructions provided?: Yes Any new allergies since your discharge?: No Dietary orders reviewed?: Yes Type of Diet Ordered:: Reg , heart healthy Do you have support at home?: Yes People in Home: spouse Name of Support/Comfort Primary Source: [name]  Medications Reviewed Today: Medications Reviewed Today     Reviewed by [name], RN (Registered Nurse) on 06/07/23 at 1028  Med List Status: <None>   Medication Order Taking? Sig Documenting Provider Last Dose Status Informant  Accu-Chek Softclix Lancets lancets [id number] Yes 1 each by Other route in the morning and at bedtime. Use as instructed Larae Grooms, NP Taking Active            Med Note [name], [name] L   Tue Jun 07, 2023 10:19 AM) Checks 1 x day   amitriptyline (ELAVIL) 10 MG tablet [id number]  Take 1 tablet (10 mg total) by mouth at bedtime. [name], NP  Expired 04/29/23 2359   aspirin EC 81 MG tablet [id number] Yes Take 1 tablet (81 mg total) by mouth daily. Swallow whole. [name], MD Taking Active   Blood Glucose Monitoring Suppl (ACCU-CHEK AVIVA PLUS) w/Device KIT [id number] Yes 1 each by Does not apply route in the morning and at bedtime. Larae Grooms, NP Taking Active   clopidogrel (PLAVIX) 75 MG tablet  [id number] Yes Take 1 tablet (75 mg total) by mouth daily for 20 days. [name], MD Taking Active   cyclobenzaprine (FLEXERIL) 5 MG tablet [id number] Yes Take 1 tablet (5 mg total) by mouth daily as needed for muscle spasms. [name], NP Taking Active   diazepam (VALIUM) 5 MG tablet [id number] No Take 1 tablet (5 mg total) by mouth every 6 (six) hours as needed for anxiety. Take 1 tablet 1 hour prior to procedure and repeat with second tablet when walking into imaging.  Patient not taking: Reported on 06/07/2023   Larae Grooms, NP Not Taking Active   divalproex (DEPAKOTE) 250 MG DR tablet [id number] Yes Take 1 tablet (250 mg total) by mouth 2 (two) times daily. Increase to 500 mg bid after 1 week if you tolerate it [name], MD Taking Active   glucose blood (ACCU-CHEK AVIVA PLUS) test strip [id number] Yes 1 each by Other route in the morning and at bedtime. Use as instructed Larae Grooms, NP Taking Active   magnesium chloride (SLOW-MAG) 64 MG TBEC SR tablet [id number] Yes Take 1 tablet by mouth daily. [provider] Taking Active Self  metFORMIN (GLUCOPHAGE) 500 MG tablet [id number] Yes TAKE 2 TABLETS BY MOUTH TWICE DAILY WITH MEALS Larae Grooms, NP Taking Active            Med Note (Sneha Willig L   Tue Jun 07, 2023 10:24 AM) [name] 500mg  2 x day ( decreased)  metoprolol succinate (TOPROL-XL) 25 MG 24 hr tablet [id number] No Take 1 tablet by mouth once daily  Patient not taking: Reported  on 06/07/2023   Larae Grooms, NP Not Taking Active   naproxen sodium (ALEVE) 220 MG tablet [id number] Yes Take 2 tablets by mouth daily. [provider] Taking Active Self  rizatriptan (MAXALT-MLT) 10 MG disintegrating tablet [id number] Yes Take 1 tablet (10 mg total) by mouth as needed for migraine. May repeat in 2 hours if needed Defelice, Para March, NP Taking Active   rosuvastatin (CRESTOR) 20 MG tablet [id number] Yes Take 1 tablet (20 mg total) by mouth daily.  [name], MD Taking Active   TRULICITY 3 MG/0.5ML Ivory Broad [id number] Yes INJECT 3MG  ONCE A WEEK AS DIRECTED Larae Grooms, NP Taking Active   valsartan (DIOVAN) 40 MG tablet [id number] Yes Take 1 tablet by mouth once daily Mecum, Erin E, PA-C Taking Active   Vitamin D, Ergocalciferol, (DRISDOL) 1.25 MG (50000 UNIT) CAPS capsule [id number] Yes Take 1 capsule (50,000 Units total) by mouth every 7 (seven) days. [name], DO Taking Active            Med Note [name], [name] L   Tue Jun 07, 2023 10:27 AM) [name] OTC to supplement             Home Care and Equipment/Supplies: Were Home Health Services Ordered?: No Any new equipment or medical supplies ordered?: No  Functional Questionnaire: Do you need assistance with bathing/showering or dressing?: No Do you need assistance with meal preparation?: No Do you need assistance with eating?: No Do you have difficulty maintaining continence: No Do you need assistance with getting out of bed/getting out of a chair/moving?: No Do you have difficulty managing or taking your medications?: No  Follow up appointments reviewed: PCP Follow-up appointment confirmed?: Yes Date of PCP follow-up appointment?: 06/07/23 Follow-up Provider: Larae Grooms Specialist [name] Follow-up appointment confirmed?: Yes Date of Specialist follow-up appointment?: 06/07/23 Follow-Up Specialty Provider:: Cardiologist 11/20,Neurology 11/19 ( today),Opthalmology 11/19( today) Do you need transportation to your follow-up appointment?: No (He's driving) Do you understand care options if your condition(s) worsen?: Yes-patient verbalized understanding  SDOH Interventions Today    Flowsheet Row Most Recent Value  SDOH Interventions   Food Insecurity Interventions Intervention Not Indicated  Housing Interventions Intervention Not Indicated  Transportation Interventions Intervention Not Indicated, Patient Resources (Friends/Family)  [He's driving]  Utilities  Interventions Intervention Not Indicated        Discussed [facility]BCI  TOC program and weekly calls to patient to assess condition/status, medication management  and provide support/education as indicated . Patient/ Caregiver voiced understanding and declined enrollment in the 30-day TOC Program.   He feels he does not need it at this time. He is managing appts and meds without difficulty and is currently at work.  The patient has been provided with contact information for the care management team and has been advised to call with any health related questions or concerns.    Susa Loffler , BSN, RN Care Management Coordinator [facility] [email or web address] Direct Dial: [phone]

## 2023-06-08 ENCOUNTER — Ambulatory Visit: Payer: 59 | Attending: Cardiology | Admitting: Cardiology

## 2023-06-08 ENCOUNTER — Encounter: Payer: Self-pay | Admitting: Cardiology

## 2023-06-08 ENCOUNTER — Other Ambulatory Visit: Payer: Self-pay | Admitting: Physician Assistant

## 2023-06-08 ENCOUNTER — Ambulatory Visit: Payer: 59 | Admitting: Family Medicine

## 2023-06-08 VITALS — BP 100/64 | HR 98 | Ht 68.0 in | Wt 193.0 lb

## 2023-06-08 DIAGNOSIS — E785 Hyperlipidemia, unspecified: Secondary | ICD-10-CM | POA: Diagnosis not present

## 2023-06-08 DIAGNOSIS — Z8673 Personal history of transient ischemic attack (TIA), and cerebral infarction without residual deficits: Secondary | ICD-10-CM | POA: Insufficient documentation

## 2023-06-08 DIAGNOSIS — I10 Essential (primary) hypertension: Secondary | ICD-10-CM | POA: Diagnosis not present

## 2023-06-08 DIAGNOSIS — I350 Nonrheumatic aortic (valve) stenosis: Secondary | ICD-10-CM | POA: Insufficient documentation

## 2023-06-08 HISTORY — DX: Personal history of transient ischemic attack (TIA), and cerebral infarction without residual deficits: Z86.73

## 2023-06-08 HISTORY — DX: Nonrheumatic aortic (valve) stenosis: I35.0

## 2023-06-08 MED ORDER — FISH OIL 1000 MG PO CAPS
2.0000 | ORAL_CAPSULE | Freq: Two times a day (BID) | ORAL | Status: AC
Start: 2023-06-08 — End: ?

## 2023-06-08 NOTE — Patient Instructions (Signed)
Medication Instructions:  Your physician has recommended you make the following change in your medication:   Start taking 2 gm fish oil twice daily.  *If you need a refill on your cardiac medications before your next appointment, please call your pharmacy*   Lab Work: None ordered If you have labs (blood work) drawn today and your tests are completely normal, you will receive your results only by: MyChart Message (if you have MyChart) OR A paper copy in the mail If you have any lab test that is abnormal or we need to change your treatment, we will call you to review the results.   Testing/Procedures: None ordered   Follow-Up: At [facility], you and your health needs are our priority.  As part of our continuing mission to provide you with exceptional heart care, we have created designated Provider Care Teams.  These Care Teams include your primary Cardiologist (physician) and Advanced Practice Providers (APPs -  Physician Assistants and Nurse Practitioners) who all work together to provide you with the care you need, when you need it.  We recommend signing up for the patient portal called "MyChart".  Sign up information is provided on this After Visit Summary.  MyChart is used to connect with patients for Virtual Visits (Telemedicine).  Patients are able to view lab/test results, encounter notes, upcoming appointments, etc.  Non-urgent messages can be sent to your provider as well.   To learn more about what you can do with MyChart, go to [email or web address].    Your next appointment:   9 month(s)  The format for your next appointment:   In Person  Provider:   Belva Crome, MD    Other Instructions none  Important Information About Sugar

## 2023-06-08 NOTE — Progress Notes (Signed)
Scheduled appointment on 08/09/2023 @ 1:40 pm.

## 2023-06-08 NOTE — Progress Notes (Signed)
Cardiology Office Note:    Date:  06/08/2023   ID:  Keith Barber, DOB August 31, 1970, MRN [id number]  PCP:  Keith Grooms, NP  Cardiologist:  Garwin Brothers, MD   Referring MD: Keith Grooms, NP    ASSESSMENT:    1. Hyperlipidemia, unspecified hyperlipidemia type   2. Essential hypertension   3. History of stroke   4. Moderate aortic stenosis    PLAN:    In order of problems listed above:  Primary prevention stressed with the patient.  Importance of compliance with diet medication stressed and patient verbalized standing. Moderate aortic stenosis: Stable at this time.  Asymptomatic.  Symptoms discussed and education given to him and his wife and he vocalized understanding and questions were answered to their satisfaction. Mixed dyslipidemia: On lipid-lowering medications followed by primary care.  LDL is fine.  Triglycerides are elevated.  Diet emphasized.  Advised the patient to initiate fish oil 2 g twice daily and follow lipids with primary care.  Goal LDL less than 150. Obesity: Weight reduction stressed diet emphasized and he vocalized understanding. Diabetes mellitus: Managed by primary care.  Diet emphasized.  He promises to do better. Patient will be seen in follow-up appointment in 6 months or earlier if the patient has any concerns.   Medication Adjustments/Labs and Tests Ordered: Current medicines are reviewed at length with the patient today.  Concerns regarding medicines are outlined above.  Orders Placed This Encounter  Procedures   EKG 12-Lead   No orders of the defined types were placed in this encounter.    History of Present Illness:    Keith Barber is a 52 y.o. male who is being seen today for the evaluation of moderate aortic stenosis at the request of Keith Grooms, NP.  Patient is a pleasant 52 year old male.  He has past medical history of essential hypertension, diabetes mellitus, mixed dyslipidemia and stroke.  The patient  has been diagnosed to have moderate aortic stenosis.  He denies any chest pain orthopnea or PND.  No shortness of breath on exertion or syncope.  He is here to be established.  At the time of my evaluation, the patient is alert awake oriented and in no distress.  Past Medical History:  Diagnosis Date   Acute CVA (cerebrovascular accident) (HCC) 06/04/2023   Adhesive capsulitis of left shoulder 12/15/2017   Anxiety and depression 06/04/2023   Benign neoplasm of rectosigmoid junction    Bilateral inguinal hernia without obstruction or gangrene    Bone spur    LEFT SHOULDER THAT MAKES HIS LEFT ARM TINGLE - had removed   Diverticulitis large intestine 09/2017   Diverticulosis of large intestine without diverticulitis    Dyslipidemia 06/04/2023   Encounter for medication refill 05/29/2023   Erythrocytosis 05/07/2022   Essential hypertension 06/04/2023   GERD (gastroesophageal reflux disease)    H/O migraine with aura 05/29/2023   Hand pain, left 07/09/2020   Hemorrhoids    History of colonic polyps 08/30/2022   History of diverticulitis 12/26/2017   History of tobacco abuse 11/26/2016   Hyperlipidemia    Hyperlipidemia associated with type 2 diabetes mellitus (HCC)    Hypertension    Hypertension associated with diabetes (HCC)    Impingement syndrome of left shoulder region 07/05/2017   Internal hemorrhoids    Labral tear of shoulder, left, initial encounter 11/12/2015   Lateral epicondylitis, left elbow 07/09/2020   Migraines    rare migraines   Ocular migraine with status migrainosus 03/04/2023  Onychomycosis 12/26/2017   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   Radial nerve compression, left 07/09/2020   Residual hemorrhoidal skin tags    Rotator cuff tendinitis, left 11/12/2015   Rupture of left long head biceps tendon 01/14/2020   Sleep apnea    Stomach irritation    Stress reaction 05/29/2023   Traumatic ecchymosis of left hand 07/09/2020   Type  2 diabetes mellitus without complications (HCC) 06/04/2023   Umbilical hernia without obstruction and without gangrene    Uncontrolled type 2 diabetes mellitus with hyperglycemia (HCC) 04/18/2018   Vitamin D deficiency 11/02/2022    Past Surgical History:  Procedure Laterality Date   16 HOUR PH STUDY N/A 05/30/2018   Procedure: 24 HOUR PH STUDY;  Surgeon: Pasty Spillers, MD;  Location: [facility] ENDOSCOPY;  Service: Gastroenterology;  Laterality: N/A;   bicep tendon tear      COLONOSCOPY WITH PROPOFOL N/A 02/01/2018   Procedure: COLONOSCOPY WITH PROPOFOL;  Surgeon: Pasty Spillers, MD;  Location: [facility] ENDOSCOPY;  Service: Endoscopy;  Laterality: N/A;   COLONOSCOPY WITH PROPOFOL N/A 08/30/2022   Procedure: COLONOSCOPY WITH PROPOFOL;  Surgeon: Wyline Mood, MD;  [facility];  Service: Gastroenterology;  Laterality: N/A;   ESOPHAGEAL MANOMETRY N/A 05/30/2018   Procedure: ESOPHAGEAL MANOMETRY (EM);  Surgeon: Pasty Spillers, MD;  Location: [facility] ENDOSCOPY;  Service: Gastroenterology;  Laterality: N/A;   ESOPHAGOGASTRODUODENOSCOPY (EGD) WITH PROPOFOL N/A 02/01/2018   Procedure: ESOPHAGOGASTRODUODENOSCOPY (EGD) WITH PROPOFOL;  Surgeon: Pasty Spillers, MD;  Location: [facility] ENDOSCOPY;  Service: Endoscopy;  Laterality: N/A;   INGUINAL HERNIA REPAIR Bilateral 01/21/2017   Procedure: LAPAROSCOPIC BILATERAL INGUINAL HERNIA REPAIR;  Surgeon: Leafy Ro, MD;  Location: [facility] ORS;  Service: General;  Laterality: Bilateral;   KNEE SURGERY Left    SHOULDER ARTHROSCOPY WITH DISTAL CLAVICLE RESECTION Left 07/05/2017   Procedure: LEFT SHOULDER ARTHROSCOPY, SUBACROMIAL DECOMPRESSION, DISTAL CLAVICLE RESECTION, POSSIBLE MINI OPEN ROTATOR CUFF REPAIR;  Surgeon: Valeria Batman, MD;  Location[facility];  Service: Orthopedics;  Laterality: Left;   SHOULDER CLOSED REDUCTION Left 12/13/2017   Procedure: LEFT SHOULDER MANIPULATION with steroid injection;  Surgeon: Valeria Batman, MD;   Location: [facility] OR;  Service: Orthopedics;  Laterality: Left;   SHOULDER SURGERY Right    UMBILICAL HERNIA REPAIR N/A 01/21/2017   Procedure: HERNIA REPAIR UMBILICAL ADULT;  Surgeon: Leafy Ro, MD;  Location: [facility] ORS;  Service: General;  Laterality: N/A;    Current Medications: Current Meds  Medication Sig   Accu-Chek Softclix Lancets lancets 1 each by Other route in the morning and at bedtime. Use as instructed   amitriptyline (ELAVIL) 10 MG tablet Take 10 mg by mouth at bedtime.   aspirin EC 81 MG tablet Take 1 tablet (81 mg total) by mouth daily. Swallow whole.   Blood Glucose Monitoring Suppl (ACCU-CHEK AVIVA PLUS) w/Device KIT 1 each by Does not apply route in the morning and at bedtime.   clopidogrel (PLAVIX) 75 MG tablet Take 1 tablet (75 mg total) by mouth daily for 20 days.   cyclobenzaprine (FLEXERIL) 5 MG tablet Take 1 tablet (5 mg total) by mouth daily as needed for muscle spasms.   divalproex (DEPAKOTE) 250 MG DR tablet Take 1 tablet (250 mg total) by mouth 2 (two) times daily. Increase to 500 mg bid after 1 week if you tolerate it   glucose blood (ACCU-CHEK AVIVA PLUS) test strip 1 each by Other route in the morning and at bedtime. Use as instructed  magnesium chloride (SLOW-MAG) 64 MG TBEC SR tablet Take 1 tablet by mouth daily.   metFORMIN (GLUCOPHAGE) 500 MG tablet TAKE 2 TABLETS BY MOUTH TWICE DAILY WITH MEALS   metoprolol succinate (TOPROL-XL) 25 MG 24 hr tablet Take 1 tablet by mouth once daily   naproxen sodium (ALEVE) 220 MG tablet Take 2 tablets by mouth daily.   rosuvastatin (CRESTOR) 20 MG tablet Take 1 tablet (20 mg total) by mouth daily.   TRULICITY 3 MG/0.5ML SOAJ INJECT 3MG  ONCE A WEEK AS DIRECTED   valsartan (DIOVAN) 40 MG tablet Take 1 tablet by mouth once daily   Vitamin D, Ergocalciferol, (DRISDOL) 1.25 MG (50000 UNIT) CAPS capsule Take 1 capsule (50,000 Units total) by mouth every 7 (seven) days.     Allergies:   Amlodipine   Social History    Socioeconomic History   Marital status: Divorced    Spouse name: Not on file   Number of children: Not on file   Years of education: Not on file   Highest education level: 12th grade  Occupational History   Not on file  Tobacco Use   Smoking status: Former    Current packs/day: 0.00    Average packs/day: 0.5 packs/day for 26.0 years (13.0 ttl pk-yrs)    Types: Cigarettes    Start date: 01/14/1991    Quit date: 01/13/2017    Years since quitting: 6.4   Smokeless tobacco: Never  Vaping Use   Vaping status: Never Used  Substance and Sexual Activity   Alcohol use: No   Drug use: No   Sexual activity: Yes  Other Topics Concern   Not on file  Social History Narrative   Not on file   Social Determinants of Health   Financial Resource Strain: Patient Declined (05/30/2023)   Overall Financial Resource Strain (CARDIA)    Difficulty of Paying Living Expenses: Patient declined  Food Insecurity: No Food Insecurity (06/07/2023)   Hunger Vital Sign    Worried About Running Out of Food in the Last Year: Never true    Ran Out of Food in the Last Year: Never true  Transportation Needs: No Transportation Needs (06/07/2023)   PRAPARE - Administrator, Civil Service (Medical): No    Lack of Transportation (Non-Medical): No  Physical Activity: Insufficiently Active (05/30/2023)   Exercise Vital Sign    Days of Exercise per Week: 3 days    Minutes of Exercise per Session: 20 min  Stress: Stress Concern Present (05/30/2023)   [employer] of Occupational Health - Occupational Stress Questionnaire    Feeling of Stress : To some extent  Social Connections: Unknown (05/30/2023)   Social Connection and Isolation Panel [NHANES]    Frequency of Communication with Friends and Family: More than three times a week    Frequency of Social Gatherings with Friends and Family: Once a week    Attends Religious Services: Patient declined    Database administrator or Organizations: No     Attends Engineer, structural: Not on file    Marital Status: Married     Family History: The patient's family history includes Diabetes in his mother; Lung cancer in his father and paternal grandfather; Migraines in his mother. There is no history of Cancer, COPD, Heart disease, Stroke, or Sleep apnea.  ROS:   Please see the history of present illness.    All other systems reviewed and are negative.  EKGs/Labs/Other Studies Reviewed:    The following studies were  reviewed today:  EKG Interpretation Date/Time:  Wednesday June 08 2023 15:54:52 EST Ventricular Rate:  98 PR Interval:  176 QRS Duration:  80 QT Interval:  366 QTC Calculation: 467 R Axis:   -10  Text Interpretation: Normal sinus rhythm Inferior infarct , age undetermined Abnormal ECG When compared with ECG of 29-May-2023 12:25, Inferior infarct is now Present Confirmed by Belva Crome [phone]) on 06/08/2023 2:55:23 PM     Recent Labs: 06/03/2023: ALT 26; BUN 19; Creatinine, Ser 1.00; Hemoglobin 17.6; Platelets 213; Potassium 3.9; Sodium 138 06/04/2023: TSH 1.184  Recent Lipid Panel    Component Value Date/Time   CHOL 116 06/04/2023 0313   CHOL 125 01/27/2023 0937   TRIG 255 (H) 06/04/2023 0313   HDL 27 (L) 06/04/2023 0313   HDL 37 (L) 01/27/2023 0937   CHOLHDL 4.3 06/04/2023 0313   VLDL 51 (H) 06/04/2023 0313   LDLCALC 38 06/04/2023 0313   LDLCALC 58 01/27/2023 0937    Physical Exam:    VS:  BP 100/64   Pulse 98   Ht 5\' 8"  (1.727 m)   Wt 193 lb (87.5 kg)   SpO2 95%   BMI 29.35 kg/m     Wt Readings from Last 3 Encounters:  06/08/23 193 lb (87.5 kg)  06/07/23 194 lb (88 kg)  06/04/23 196 lb 6.4 oz (89.1 kg)     GEN: Patient is in no acute distress HEENT: Normal NECK: No JVD; No carotid bruits LYMPHATICS: No lymphadenopathy CARDIAC: S1 S2 regular, 2/6 systolic murmur at the apex. RESPIRATORY:  Clear to auscultation without rales, wheezing or rhonchi  ABDOMEN: Soft,  non-tender, non-distended MUSCULOSKELETAL:  No edema; No deformity  SKIN: Warm and dry NEUROLOGIC:  Alert and oriented x 3 PSYCHIATRIC:  Normal affect    Signed, Garwin Brothers, MD  06/08/2023 3:11 PM    [facility]

## 2023-06-09 LAB — PROTHROMBIN GENE MUTATION

## 2023-06-09 NOTE — Telephone Encounter (Signed)
Requested Prescriptions  Pending Prescriptions Disp Refills   valsartan (DIOVAN) 40 MG tablet [Pharmacy Med Name: Valsartan 40 MG Oral Tablet] 90 tablet 1    Sig: Take 1 tablet by mouth once daily     Cardiovascular:  Angiotensin Receptor Blockers Passed - 06/08/2023  6:51 AM      Passed - Cr in normal range and within 180 days    Creatinine, Ser  Date Value Ref Range Status  06/03/2023 1.00 0.61 - 1.24 mg/dL Final         Passed - K in normal range and within 180 days    Potassium  Date Value Ref Range Status  06/03/2023 3.9 3.5 - 5.1 mmol/L Final         Passed - Patient is not pregnant      Passed - Last BP in normal range    BP Readings from Last 1 Encounters:  06/08/23 100/64         Passed - Valid encounter within last 6 months    Recent Outpatient Visits           2 days ago Acute CVA (cerebrovascular accident) [facility])   [facility]ae [facility]rooms, NP   1 week ago Other migraine with status migrainosus, intractable   [facility] [facility] Larae Grooms, NP   2 months ago Hypertension associated with diabetes (HCC)   [place] [facility] Mecum, Oswaldo Conroy, PA-C   3 months ago Need for shingles vaccine   [place] Pearley, Sherran Needs, NP   3 months ago COVID-19   [facility] Mecum, Oswaldo Conroy, PA-C       Future Appointments             In 2 months Larae Grooms, NP [place] [facility] [facility], [facility]

## 2023-06-13 ENCOUNTER — Other Ambulatory Visit: Payer: Self-pay | Admitting: Nurse Practitioner

## 2023-06-13 DIAGNOSIS — E1165 Type 2 diabetes mellitus with hyperglycemia: Secondary | ICD-10-CM

## 2023-06-13 LAB — FACTOR 5 LEIDEN

## 2023-06-14 NOTE — Telephone Encounter (Signed)
Requested Prescriptions  Refused Prescriptions Disp Refills   TRULICITY 3 MG/0.5ML SOAJ [Pharmacy Med Name: Trulicity 3 MG/0.5ML Subcutaneous Solution Pen-injector] 4 mL 0    Sig: INJECT 3MG  SUBCUTANEOUSLY ONCE A WEEK AS DIRECTED     Endocrinology:  Diabetes - GLP-1 Receptor Agonists Passed - 06/13/2023  6:51 AM      Passed - HBA1C is between 0 and 7.9 and within 180 days    HB A1C (BAYER DCA - WAIVED)  Date Value Ref Range Status  01/27/2023 7.0 (H) 4.8 - 5.6 % Final    Comment:             Prediabetes: 5.7 - 6.4          Diabetes: >6.4          Glycemic control for adults with diabetes: <7.0    Hgb A1c MFr Bld  Date Value Ref Range Status  06/03/2023 5.5 4.8 - 5.6 % Final    Comment:    (NOTE) Pre diabetes:          5.7%-6.4%  Diabetes:              >6.4%  Glycemic control for   <7.0% adults with diabetes          Passed - Valid encounter within last 6 months    Recent Outpatient Visits           1 week ago Acute CVA (cerebrovascular accident) [facility])   [place] [facility] Larae Grooms, NP   2 weeks ago Other migraine with status migrainosus, intractable   [place] [facility] Larae Grooms, NP   2 months ago Hypertension associated with diabetes (HCC)   [place] [facility] Mecum, Oswaldo Conroy, PA-C   3 months ago Need for shingles vaccine   [place] [facility] Pearley, Sherran Needs, NP   3 months ago COVID-19   [facility] Mecum, Oswaldo Conroy, PA-C       Future Appointments             In 1 month Larae Grooms, NP [place] [facility], [facility]

## 2023-06-20 ENCOUNTER — Other Ambulatory Visit: Payer: Self-pay | Admitting: Nurse Practitioner

## 2023-06-20 DIAGNOSIS — E1165 Type 2 diabetes mellitus with hyperglycemia: Secondary | ICD-10-CM

## 2023-06-21 ENCOUNTER — Other Ambulatory Visit: Payer: Self-pay | Admitting: Nurse Practitioner

## 2023-06-21 DIAGNOSIS — E1165 Type 2 diabetes mellitus with hyperglycemia: Secondary | ICD-10-CM

## 2023-06-21 NOTE — Telephone Encounter (Signed)
Copied from CRM [id number]. Topic: General - Other >> Jun 21, 2023  1:49 PM Everette C wrote: Reason for CRM: Medication Refill - Most Recent Primary Care Visit:  Provider: Larae Grooms Department: [facility] Visit Type: MYCHART VIDEO VISIT Date: 06/07/2023  Medication: TRULICITY 3 MG/0.5ML SOAJ [[id number]]  Has the patient contacted their pharmacy? Yes (Agent: If no, request that the patient contact the pharmacy for the refill. If patient does not wish to contact the pharmacy document the reason why and proceed with request.) (Agent: If yes, when and what did the pharmacy advise?)  Is this the correct pharmacy for this prescription? Yes If no, delete pharmacy and type the correct one.  This is the patient's preferred pharmacy:  [facility] [address], [place] - [address] [place] [address] Phone: [phone] Fax: [phone]   Has the prescription been filled recently? No  Is the patient out of the medication? Yes  Has the patient been seen for an appointment in the last year OR does the patient have an upcoming appointment? Yes  Can we respond through MyChart? No  Agent: Please be advised that Rx refills may take up to 3 business days. We ask that you follow-up with your pharmacy.

## 2023-06-21 NOTE — Telephone Encounter (Signed)
Already requested 06/20/23 by the pharmacy in a separate refill encounter.

## 2023-06-23 NOTE — Telephone Encounter (Signed)
Requested Prescriptions  Pending Prescriptions Disp Refills   TRULICITY 3 MG/0.5ML SOAJ [Pharmacy Med Name: Trulicity 3 MG/0.5ML Subcutaneous Solution Pen-injector] 4 mL 0    Sig: INJECT 3MG  SUBCUTANEOUSLY ONCE A WEEK AS DIRECTED     Endocrinology:  Diabetes - GLP-1 Receptor Agonists Passed - 06/20/2023 10:19 AM      Passed - HBA1C is between 0 and 7.9 and within 180 days    HB A1C (BAYER DCA - WAIVED)  Date Value Ref Range Status  01/27/2023 7.0 (H) 4.8 - 5.6 % Final    Comment:             Prediabetes: 5.7 - 6.4          Diabetes: >6.4          Glycemic control for adults with diabetes: <7.0    Hgb A1c MFr Bld  Date Value Ref Range Status  06/03/2023 5.5 4.8 - 5.6 % Final    Comment:    (NOTE) Pre diabetes:          5.7%-6.4%  Diabetes:              >6.4%  Glycemic control for   <7.0% adults with diabetes          Passed - Valid encounter within last 6 months    Recent Outpatient Visits           2 weeks ago Acute CVA (cerebrovascular accident) [facility])   [place] [facility] Larae Grooms, NP   3 weeks ago Other migraine with status migrainosus, intractable   [place] [facility] Larae Grooms, NP   2 months ago Hypertension associated with diabetes (HCC)   [place] [facility] Mecum, Oswaldo Conroy, PA-C   3 months ago Need for shingles vaccine   [place] [facility] Pearley, Sherran Needs, NP   4 months ago COVID-19   [facility] Mecum, Oswaldo Conroy, PA-C       Future Appointments             In 1 month Larae Grooms, NP [place] [facility], [facility]

## 2023-06-27 ENCOUNTER — Other Ambulatory Visit: Payer: Self-pay

## 2023-06-27 DIAGNOSIS — I639 Cerebral infarction, unspecified: Secondary | ICD-10-CM

## 2023-06-27 DIAGNOSIS — Z8673 Personal history of transient ischemic attack (TIA), and cerebral infarction without residual deficits: Secondary | ICD-10-CM

## 2023-06-28 ENCOUNTER — Ambulatory Visit: Payer: 59 | Admitting: Cardiology

## 2023-07-06 DIAGNOSIS — I639 Cerebral infarction, unspecified: Secondary | ICD-10-CM | POA: Diagnosis not present

## 2023-07-06 DIAGNOSIS — Z8673 Personal history of transient ischemic attack (TIA), and cerebral infarction without residual deficits: Secondary | ICD-10-CM | POA: Diagnosis not present

## 2023-07-07 ENCOUNTER — Other Ambulatory Visit: Payer: Self-pay | Admitting: Nurse Practitioner

## 2023-07-07 NOTE — Telephone Encounter (Signed)
Requested Prescriptions  Pending Prescriptions Disp Refills   metoprolol succinate (TOPROL-XL) 25 MG 24 hr tablet [Pharmacy Med Name: Metoprolol Succinate ER 25 MG Oral Tablet Extended Release 24 Hour] 90 tablet 1    Sig: Take 1 tablet by mouth once daily     Cardiovascular:  Beta Blockers Passed - 07/07/2023 10:46 AM      Passed - Last BP in normal range    BP Readings from Last 1 Encounters:  06/08/23 100/64         Passed - Last Heart Rate in normal range    Pulse Readings from Last 1 Encounters:  06/08/23 98         Passed - Valid encounter within last 6 months    Recent Outpatient Visits           1 month ago Acute CVA (cerebrovascular accident) [facility])   [place] [facility] Larae Grooms, NP   1 month ago Other migraine with status migrainosus, intractable   [place] [facility] Lar[facility] Grooms, NP   3 months ago Hypertension associated with diabetes (HCC)   [place] [facility] Mecum, Oswaldo Conroy, PA-C   4 months ago Need for shingles vaccine   [place] [facility] Pearley, Sherran Needs, NP   4 months ago COVID-19   [facility] Mecum, Oswaldo Conroy, PA-C       Future Appointments             In 1 month Larae Grooms, NP [place] [facility]   In 1 month Nobie Putnam, MD [facility]

## 2023-07-15 ENCOUNTER — Other Ambulatory Visit: Payer: Self-pay | Admitting: Nurse Practitioner

## 2023-07-15 DIAGNOSIS — E1165 Type 2 diabetes mellitus with hyperglycemia: Secondary | ICD-10-CM

## 2023-07-18 ENCOUNTER — Other Ambulatory Visit: Payer: Self-pay | Admitting: Nurse Practitioner

## 2023-07-19 NOTE — Telephone Encounter (Signed)
 Requested Prescriptions  Pending Prescriptions Disp Refills   TRULICITY  3 MG/0.5ML SOAJ [Pharmacy Med Name: Trulicity  3 MG/0.5ML Subcutaneous Solution Pen-injector] 4 mL 0    Sig: INJECT 3 MG UNDER THE SKIN ONCE WEEKLY AS DIRECTED     Endocrinology:  Diabetes - GLP-1 Receptor Agonists Passed - 07/19/2023  5:40 PM      Passed - HBA1C is between 0 and 7.9 and within 180 days    HB A1C (BAYER DCA - WAIVED)  Date Value Ref Range Status  01/27/2023 7.0 (H) 4.8 - 5.6 % Final    Comment:             Prediabetes: 5.7 - 6.4          Diabetes: >6.4          Glycemic control for adults with diabetes: <7.0    Hgb A1c MFr Bld  Date Value Ref Range Status  06/03/2023 5.5 4.8 - 5.6 % Final    Comment:    (NOTE) Pre diabetes:          5.7%-6.4%  Diabetes:              >6.4%  Glycemic control for   <7.0% adults with diabetes          Passed - Valid encounter within last 6 months    Recent Outpatient Visits           1 month ago Acute CVA (cerebrovascular accident) (HCC)   [place] [facility] Melvin Pao, NP   1 month ago Other migraine with status migrainosus, intractable   [place] [facility] Melvin Pao, NP   3 months ago Hypertension associated with diabetes (HCC)   [place] [facility] Mecum, Erin E, PA-C   4 months ago Need for shingles vaccine   [place] [facility] Pearley, Hyla Givens, NP   4 months ago COVID-19   [facility] Mecum, Rocky BRAVO, PA-C       Future Appointments             In 3 weeks Melvin Pao, NP [place] [facility]   In 4 weeks Kennyth Chew, MD [facility]

## 2023-07-20 DIAGNOSIS — Z419 Encounter for procedure for purposes other than remedying health state, unspecified: Secondary | ICD-10-CM | POA: Diagnosis not present

## 2023-07-21 NOTE — Telephone Encounter (Signed)
 Requested Prescriptions  Pending Prescriptions Disp Refills   metFORMIN  (GLUCOPHAGE ) 500 MG tablet [Pharmacy Med Name: metFORMIN  HCl 500 MG Oral Tablet] 360 tablet 0    Sig: TAKE 2 TABLETS BY MOUTH TWICE DAILY WITH MEALS     Endocrinology:  Diabetes - Biguanides Failed - 07/21/2023  5:32 PM      Failed - B12 Level in normal range and within 720 days    No results found for: VITAMINB12       Passed - Cr in normal range and within 360 days    Creatinine, Ser  Date Value Ref Range Status  06/03/2023 1.00 0.61 - 1.24 mg/dL Final         Passed - HBA1C is between 0 and 7.9 and within 180 days    HB A1C (BAYER DCA - WAIVED)  Date Value Ref Range Status  01/27/2023 7.0 (H) 4.8 - 5.6 % Final    Comment:             Prediabetes: 5.7 - 6.4          Diabetes: >6.4          Glycemic control for adults with diabetes: <7.0    Hgb A1c MFr Bld  Date Value Ref Range Status  06/03/2023 5.5 4.8 - 5.6 % Final    Comment:    (NOTE) Pre diabetes:          5.7%-6.4%  Diabetes:              >6.4%  Glycemic control for   <7.0% adults with diabetes          Passed - eGFR in normal range and within 360 days    GFR calc Af Amer  Date Value Ref Range Status  02/05/2020 >60 >60 mL/min Final   GFR, Estimated  Date Value Ref Range Status  06/03/2023 >60 >60 mL/min Final    Comment:    (NOTE) Calculated using the CKD-EPI Creatinine Equation (2021)    eGFR  Date Value Ref Range Status  01/27/2023 85 >59 mL/min/1.73 Final         Passed - Valid encounter within last 6 months    Recent Outpatient Visits           1 month ago Acute CVA (cerebrovascular accident) [facility])   [place] [facility] Melvin Pao, NP   1 month ago Other migraine with status migrainosus, intractable   [place] [facility] Melvin Pao, NP   3 months ago Hypertension associated with diabetes (HCC)   [place] [facility] Mecum, Rocky BRAVO, PA-C   4 months ago  Need for shingles vaccine   [place] [facility], Hyla Givens, NP   4 months ago COVID-19   [place] [facility] Mecum, Rocky BRAVO, PA-C       Future Appointments             In 2 weeks Melvin Pao, NP  [facility], [facility]   In 3 weeks Kennyth Chew, MD [facility] - CBC within normal limits and completed in the last 12 months    WBC  Date Value Ref Range Status  06/03/2023 10.2 4.0 - 10.5 K/uL Final   RBC  Date Value Ref Range Status  06/03/2023 5.90 (H) 4.22 - 5.81 MIL/uL Final   Hemoglobin  Date Value Ref Range Status  06/03/2023 17.6 (H) 13.0 - 17.0 g/dL Final  95/88/7980 82.7 13.0 - 17.7 g/dL Final   HCT  Date Value Ref Range Status  06/03/2023 52.6 (H) 39.0 - 52.0 % Final   Hematocrit  Date Value Ref Range Status  10/27/2017 51.0 37.5 - 51.0 % Final   MCHC  Date Value Ref Range Status  06/03/2023 33.5 30.0 - 36.0 g/dL Final   [facility]  Date Value Ref Range Status  06/03/2023 29.8 26.0 - 34.0 pg Final   MCV  Date Value Ref Range Status  06/03/2023 89.2 80.0 - 100.0 fL Final  10/27/2017 90 79 - 97 fL Final   No results found for: PLTCOUNTKUC, LABPLAT, POCPLA RDW  Date Value Ref Range Status  06/03/2023 12.8 11.5 - 15.5 % Final  10/27/2017 12.8 12.3 - 15.4 % Final

## 2023-07-27 ENCOUNTER — Encounter: Payer: Self-pay | Admitting: Oncology

## 2023-08-05 ENCOUNTER — Other Ambulatory Visit: Payer: Self-pay | Admitting: Nurse Practitioner

## 2023-08-05 DIAGNOSIS — E1165 Type 2 diabetes mellitus with hyperglycemia: Secondary | ICD-10-CM

## 2023-08-05 NOTE — Telephone Encounter (Signed)
Medication Refill -  Most Recent Primary Care Visit:  Provider: Larae Grooms  Department: [facility]  Visit Type: MYCHART VIDEO VISIT  Date: 06/07/2023  Medication:  TRULICITY 3 MG/0.5ML SOAJ   Has the patient contacted their pharmacy? yes (Agent: If yes, when and what did the pharmacy advise?)contact pcp  Pt is all out, says has missed a week of dosage, only had 4 per week when he started/last refill was Dec 5  This is the patient's preferred pharmacy:  [facility] [address]), [place] - [address]) [place] [address] Phone: [phone] Fax: [phone]   Has the prescription been filled recently? no  Is the patient out of the medication? yes  Has the patient been seen for an appointment in the last year OR does the patient have an upcoming appointment? yes  Can we respond through MyChart? yes  Agent: Please be advised that Rx refills may take up to 3 business days. We ask that you follow-up with your pharmacy.

## 2023-08-06 ENCOUNTER — Other Ambulatory Visit: Payer: Self-pay | Admitting: Nurse Practitioner

## 2023-08-06 DIAGNOSIS — E1165 Type 2 diabetes mellitus with hyperglycemia: Secondary | ICD-10-CM

## 2023-08-08 ENCOUNTER — Encounter: Payer: Self-pay | Admitting: Oncology

## 2023-08-08 NOTE — Telephone Encounter (Signed)
Requested Prescriptions  Pending Prescriptions Disp Refills   TRULICITY 3 MG/0.5ML SOAJ [Pharmacy Med Name: Trulicity 3 MG/0.5ML Subcutaneous Solution Pen-injector] 4 mL 0    Sig: INJECT 3 MG UNDER THE SKIN ONCE WEEKLY AS DIRECTED     Endocrinology:  Diabetes - GLP-1 Receptor Agonists Passed - 08/08/2023 12:06 PM      Passed - HBA1C is between 0 and 7.9 and within 180 days    HB A1C (BAYER DCA - WAIVED)  Date Value Ref Range Status  01/27/2023 7.0 (H) 4.8 - 5.6 % Final    Comment:             Prediabetes: 5.7 - 6.4          Diabetes: >6.4          Glycemic control for adults with diabetes: <7.0    Hgb A1c MFr Bld  Date Value Ref Range Status  06/03/2023 5.5 4.8 - 5.6 % Final    Comment:    (NOTE) Pre diabetes:          5.7%-6.4%  Diabetes:              >6.4%  Glycemic control for   <7.0% adults with diabetes          Passed - Valid encounter within last 6 months    Recent Outpatient Visits           2 months ago Acute CVA (cerebrovascular accident) [facility])   [place] [facility] Larae Grooms, NP   2 months ago Other migraine with status migrainosus, intractable   [place] [facility] Larae Grooms, NP   4 months ago Hypertension associated with diabetes (HCC)   [place] [facility] Mecum, Oswaldo Conroy, PA-C   5 months ago Need for shingles vaccine   [place] [facility] Pearley, Sherran Needs, NP   5 months ago COVID-19   [employer] Mecum, Oswaldo Conroy, PA-C       Future Appointments             Tomorrow Larae Grooms, NP  [facility]   In 1 week Nobie Putnam, MD [facility]

## 2023-08-09 ENCOUNTER — Ambulatory Visit: Payer: Medicaid Other | Admitting: Nurse Practitioner

## 2023-08-09 ENCOUNTER — Encounter: Payer: Self-pay | Admitting: Nurse Practitioner

## 2023-08-09 VITALS — BP 118/73 | HR 97 | Temp 98.0°F | Ht 68.0 in | Wt 198.0 lb

## 2023-08-09 DIAGNOSIS — I152 Hypertension secondary to endocrine disorders: Secondary | ICD-10-CM

## 2023-08-09 DIAGNOSIS — Z7985 Long-term (current) use of injectable non-insulin antidiabetic drugs: Secondary | ICD-10-CM

## 2023-08-09 DIAGNOSIS — E1169 Type 2 diabetes mellitus with other specified complication: Secondary | ICD-10-CM | POA: Diagnosis not present

## 2023-08-09 DIAGNOSIS — G43109 Migraine with aura, not intractable, without status migrainosus: Secondary | ICD-10-CM

## 2023-08-09 DIAGNOSIS — E119 Type 2 diabetes mellitus without complications: Secondary | ICD-10-CM | POA: Diagnosis not present

## 2023-08-09 DIAGNOSIS — E785 Hyperlipidemia, unspecified: Secondary | ICD-10-CM | POA: Diagnosis not present

## 2023-08-09 DIAGNOSIS — E782 Mixed hyperlipidemia: Secondary | ICD-10-CM

## 2023-08-09 DIAGNOSIS — I10 Essential (primary) hypertension: Secondary | ICD-10-CM | POA: Diagnosis not present

## 2023-08-09 DIAGNOSIS — Z8673 Personal history of transient ischemic attack (TIA), and cerebral infarction without residual deficits: Secondary | ICD-10-CM

## 2023-08-09 DIAGNOSIS — E1159 Type 2 diabetes mellitus with other circulatory complications: Secondary | ICD-10-CM | POA: Diagnosis not present

## 2023-08-09 LAB — MICROALBUMIN, URINE WAIVED
Creatinine, Urine Waived: 200 mg/dL (ref 10–300)
Microalb, Ur Waived: 30 mg/L — ABNORMAL HIGH (ref 0–19)
Microalb/Creat Ratio: <30 mg/g (ref <30)

## 2023-08-09 MED ORDER — CYCLOBENZAPRINE HCL 5 MG PO TABS: 5.0000 mg | ORAL_TABLET | Freq: Every day | ORAL | 1 refills | Status: DC | PRN

## 2023-08-09 NOTE — Assessment & Plan Note (Signed)
Chronic.  Goal is LDL <150.  Currently on Rosuvastatin 20mg .  Continue with current medication regimen.  Labs ordered today.  Follow up in 6 months.  Call sooner if concerns arise.

## 2023-08-09 NOTE — Assessment & Plan Note (Signed)
Continue with ASA.  Continue to follow up with Neurology. Work on diet and keeping A1c well controlled.

## 2023-08-09 NOTE — Progress Notes (Signed)
BP 118/73 (BP Location: Right Arm, Patient Position: Sitting, Cuff Size: Large)   Pulse 97   Temp 98 F (36.7 C) (Oral)   Ht 5\' 8"  (1.727 m)   Wt 198 lb (89.8 kg)   SpO2 96%   BMI 30.11 kg/m    Subjective:    Patient ID: Keith Barber, male    DOB: May 22, 1971, 53 y.o.   MRN: [id number]  HPI: Keith Barber is a 53 y.o. male  Chief Complaint  Patient presents with   Diabetes   Hypertension   Hyperlipidemia   Medication Management    Trulicity, stated he was declined by provider and would like to know if he's being taken off the medication    DIABETES Patient states his vision has been a lot better since decreasing the Metformin dose.  Trulicity 3mg  weekly  and tolerating it well.  He is also taking Metformin 500mg  BID. Hypoglycemic episodes: no Polydipsia/polyuria: yes Visual disturbance: no Chest pain: no Paresthesias: no Glucose Monitoring: yes  Accucheck frequency: daily  Fasting glucose: 115-130  Post prandial:  Evening:  Before meals: Taking Insulin?: no  Long acting insulin:  Short acting insulin: Blood Pressure Monitoring: daily Retinal Examination: Up to Date Foot Exam: Up to Date Diabetic Education: Not Completed Pneumovax: Up to Date Influenza: Up to Date Aspirin: no  HYPERTENSION / HYPERLIPIDEMIA Satisfied with current treatment? yes Duration of hypertension: years BP monitoring frequency: not checking BP range:  BP medication side effects: no Past BP meds: valsartan Duration of hyperlipidemia: years Cholesterol medication side effects: no Cholesterol supplements: none Past cholesterol medications: rosuvastatin (crestor) Medication compliance: excellent compliance Aspirin: yes Recent stressors: no Recurrent headaches: no Visual changes: no Palpitations: no Dyspnea: no Chest pain: no Lower extremity edema: no Dizzy/lightheaded: no  MIGRAINES Patient stopped the Depakote due to hallucinations.  He has not been taking the  maxalt since the stroke in October.  He has had some headaches and he is only taking 1 tylenol.     STROKE HISTORY He is taking ASA daily and following up with Neurology.    SLEEP APNEA Has a full face mask which causes a lot of mouth sores because he is a mouth breather. Hasn't used it in 6 months.   Sleep apnea status: uncontrolled Duration: months Satisfied with current treatment?:  no CPAP use:  no Sleep quality with CPAP use:  not using Treament compliance:poor compliance    Relevant past medical, surgical, family and social history reviewed and updated as indicated. Interim medical history since our last visit reviewed. Allergies and medications reviewed and updated.  Review of Systems  Eyes:  Negative for visual disturbance.  Respiratory:  Negative for chest tightness and shortness of breath.   Cardiovascular:  Negative for chest pain, palpitations and leg swelling.  Endocrine: Positive for polyuria. Negative for polydipsia.  Neurological:  Positive for headaches. Negative for dizziness, light-headedness and numbness.    Per HPI unless specifically indicated above     Objective:    BP 118/73 (BP Location: Right Arm, Patient Position: Sitting, Cuff Size: Large)   Pulse 97   Temp 98 F (36.7 C) (Oral)   Ht 5\' 8"  (1.727 m)   Wt 198 lb (89.8 kg)   SpO2 96%   BMI 30.11 kg/m   Wt Readings from Last 3 Encounters:  08/09/23 198 lb (89.8 kg)  06/08/23 193 lb (87.5 kg)  06/07/23 194 lb (88 kg)    Physical Exam Vitals and nursing note reviewed.  Constitutional:      General: He is not in acute distress.    Appearance: Normal appearance. He is not ill-appearing, toxic-appearing or diaphoretic.  HENT:     Head: Normocephalic.     Right Ear: External ear normal.     Left Ear: External ear normal.     Nose: Nose normal. No congestion or rhinorrhea.     Mouth/Throat:     Mouth: Mucous membranes are moist.  Eyes:     General:        Right eye: No discharge.         Left eye: No discharge.     Extraocular Movements: Extraocular movements intact.     Conjunctiva/sclera: Conjunctivae normal.     Pupils: Pupils are equal, round, and reactive to light.  Cardiovascular:     Rate and Rhythm: Normal rate and regular rhythm.     Heart sounds: No murmur heard. Pulmonary:     Effort: Pulmonary effort is normal. No respiratory distress.     Breath sounds: Normal breath sounds. No wheezing, rhonchi or rales.  Abdominal:     General: Abdomen is flat. Bowel sounds are normal.  Musculoskeletal:     Cervical back: Normal range of motion and neck supple.  Skin:    General: Skin is warm and dry.     Capillary Refill: Capillary refill takes less than 2 seconds.  Neurological:     General: No focal deficit present.     Mental Status: He is alert and oriented to person, place, and time.  Psychiatric:        Mood and Affect: Mood normal.        Behavior: Behavior normal.        Thought Content: Thought content normal.        Judgment: Judgment normal.     Results for orders placed or performed during the hospital encounter of 06/03/23  Protime-INR   Collection Time: 06/03/23  6:41 PM  Result Value Ref Range   Prothrombin Time 13.0 11.4 - 15.2 seconds   INR 1.0 0.8 - 1.2  APTT   Collection Time: 06/03/23  6:41 PM  Result Value Ref Range   aPTT 25 24 - 36 seconds  CBC   Collection Time: 06/03/23  6:41 PM  Result Value Ref Range   WBC 10.2 4.0 - 10.5 K/uL   RBC 5.90 (H) 4.22 - 5.81 MIL/uL   Hemoglobin 17.6 (H) 13.0 - 17.0 g/dL   HCT 54.0 (H) 98.1 - 19.1 %   MCV 89.2 80.0 - 100.0 fL   MCH 29.8 26.0 - 34.0 pg   MCHC 33.5 30.0 - 36.0 g/dL   RDW 47.8 29.5 - 62.1 %   Platelets 213 150 - 400 K/uL   nRBC 0.0 0.0 - 0.2 %  Differential   Collection Time: 06/03/23  6:41 PM  Result Value Ref Range   Neutrophils Relative % 60 %   Neutro Abs 6.0 1.7 - 7.7 K/uL   Lymphocytes Relative 32 %   Lymphs Abs 3.2 0.7 - 4.0 K/uL   Monocytes Relative 7 %   Monocytes  Absolute 0.7 0.1 - 1.0 K/uL   Eosinophils Relative 1 %   Eosinophils Absolute 0.1 0.0 - 0.5 K/uL   Basophils Relative 0 %   Basophils Absolute 0.0 0.0 - 0.1 K/uL   Immature Granulocytes 0 %   Abs Immature Granulocytes 0.04 0.00 - 0.07 K/uL  Comprehensive metabolic panel   Collection Time: 06/03/23  6:41 PM  Result Value Ref Range   Sodium 138 135 - 145 mmol/L   Potassium 3.9 3.5 - 5.1 mmol/L   Chloride 108 98 - 111 mmol/L   CO2 24 22 - 32 mmol/L   Glucose, Bld 102 (H) 70 - 99 mg/dL   BUN 19 6 - 20 mg/dL   Creatinine, Ser 1.61 0.61 - 1.24 mg/dL   Calcium 8.5 (L) 8.9 - 10.3 mg/dL   Total Protein 6.7 6.5 - 8.1 g/dL   Albumin 3.9 3.5 - 5.0 g/dL   AST 25 15 - 41 U/L   ALT 26 0 - 44 U/L   Alkaline Phosphatase 74 38 - 126 U/L   Total Bilirubin 0.7 <1.2 mg/dL   GFR, Estimated >09 >60 mL/min   Anion gap 6 5 - 15  Ethanol   Collection Time: 06/03/23  6:41 PM  Result Value Ref Range   Alcohol, Ethyl (B) <10 <10 mg/dL  Hemoglobin A5W   Collection Time: 06/03/23  6:41 PM  Result Value Ref Range   Hgb A1c MFr Bld 5.5 4.8 - 5.6 %   Mean Plasma Glucose 111.15 mg/dL  Troponin I (High Sensitivity)   Collection Time: 06/03/23  6:41 PM  Result Value Ref Range   Troponin I (High Sensitivity) 7 <18 ng/L  HIV Antibody (routine testing w rflx)   Collection Time: 06/04/23  3:13 AM  Result Value Ref Range   HIV Screen 4th Generation wRfx Non Reactive Non Reactive  Lipid panel   Collection Time: 06/04/23  3:13 AM  Result Value Ref Range   Cholesterol 116 0 - 200 mg/dL   Triglycerides 098 (H) <150 mg/dL   HDL 27 (L) >11 mg/dL   Total CHOL/HDL Ratio 4.3 RATIO   VLDL 51 (H) 0 - 40 mg/dL   LDL Cholesterol 38 0 - 99 mg/dL  Glucose, capillary   Collection Time: 06/04/23  5:18 AM  Result Value Ref Range   Glucose-Capillary 104 (H) 70 - 99 mg/dL  Glucose, capillary   Collection Time: 06/04/23  8:18 AM  Result Value Ref Range   Glucose-Capillary 116 (H) 70 - 99 mg/dL  Glucose, capillary    Collection Time: 06/04/23 11:23 AM  Result Value Ref Range   Glucose-Capillary 135 (H) 70 - 99 mg/dL   Comment 1 Notify RN   ECHOCARDIOGRAM COMPLETE BUBBLE STUDY   Collection Time: 06/04/23 12:19 PM  Result Value Ref Range   Ao pk vel 3.00 m/s   AV Area VTI 1.20 cm2   AR max vel 1.33 cm2   AV Mean grad 23.0 mmHg   AV Peak grad 36.0 mmHg   S' Lateral 2.80 cm   AV Area mean vel 1.19 cm2   Area-P 1/2 4.39 cm2   P 1/2 time 568 msec   Est EF 60 - 65%   Antithrombin III   Collection Time: 06/04/23  2:10 PM  Result Value Ref Range   AntiThromb III Func 124 (H) 75 - 120 %  Protein C activity   Collection Time: 06/04/23  2:10 PM  Result Value Ref Range   Protein C Activity 119 73 - 180 %  Protein C, total   Collection Time: 06/04/23  2:10 PM  Result Value Ref Range   Protein C, Total 102 60 - 150 %  Protein S activity   Collection Time: 06/04/23  2:10 PM  Result Value Ref Range   Protein S Activity 93 63 - 140 %  Protein S, total   Collection Time: 06/04/23  2:10 PM  Result Value Ref Range   Protein S Ag, Total 75 60 - 150 %  Lupus anticoagulant panel   Collection Time: 06/04/23  2:10 PM  Result Value Ref Range   PTT Lupus Anticoagulant 32.1 0.0 - 43.5 sec   DRVVT 33.9 0.0 - 47.0 sec   Lupus Anticoag Interp Comment:   Beta-2-glycoprotein i abs, IgG/M/A   Collection Time: 06/04/23  2:10 PM  Result Value Ref Range   Beta-2 Glyco I IgG <9 0 - 20 GPI IgG units   Beta-2-Glycoprotein I IgM <9 0 - 32 GPI IgM units   Beta-2-Glycoprotein I IgA <9 0 - 25 GPI IgA units  Homocysteine, serum   Collection Time: 06/04/23  2:10 PM  Result Value Ref Range   Homocysteine 12.6 0.0 - 14.5 umol/L  Factor 5 leiden   Collection Time: 06/04/23  2:10 PM  Result Value Ref Range   Recommendations-F5LEID: Comment    Reviewed By: Comment   Prothrombin gene mutation   Collection Time: 06/04/23  2:10 PM  Result Value Ref Range   Recommendations-PTGENE: Comment    Reviewed by: Comment    Cardiolipin antibodies, IgG, IgM, IgA   Collection Time: 06/04/23  2:10 PM  Result Value Ref Range   Anticardiolipin IgG <9 0 - 14 GPL U/mL   Anticardiolipin IgM <9 0 - 12 MPL U/mL   Anticardiolipin IgA <9 0 - 11 APL U/mL  Sedimentation rate   Collection Time: 06/04/23  2:10 PM  Result Value Ref Range   Sed Rate 2 0 - 20 mm/hr  C-reactive protein   Collection Time: 06/04/23  2:10 PM  Result Value Ref Range   CRP 1.0 (H) <1.0 mg/dL  RPR   Collection Time: 06/04/23  2:10 PM  Result Value Ref Range   RPR Ser Ql NON REACTIVE NON REACTIVE  TSH   Collection Time: 06/04/23  2:10 PM  Result Value Ref Range   TSH 1.184 0.350 - 4.500 uIU/mL  Glucose, capillary   Collection Time: 06/04/23  3:29 PM  Result Value Ref Range   Glucose-Capillary 104 (H) 70 - 99 mg/dL      Assessment & Plan:   Problem List Items Addressed This Visit       Cardiovascular and Mediastinum   Migraines   Chronic.  Improved from prior.  Followed by Neurology.  No longer taking Depakote due to hallucinations.  Continue with PRN tylenol for headaches. Continue to follow up with Neurology.       Relevant Medications   cyclobenzaprine (FLEXERIL) 5 MG tablet   Essential hypertension   Chronic.  Controlled.  Continue with current medication regimen of Valsartan 40mg .  Labs ordered today.  Return to clinic in 3 months for reevaluation.  Call sooner if concerns arise.        RESOLVED: Hypertension associated with diabetes (HCC) - Primary   Relevant Orders   Comp Met (CMET)   HgB A1c     Endocrine   Diabetes mellitus treated with injections of non-insulin medication (HCC)   Chronic.  A1c improved in November to 5.5%.  Sugars are running 115-130.  Eye exam up to date.  Microalbumin done today.  Continue with Trulicity 3mg  and Metformin.  Labs ordered today. Will make recommendations based on results.       Relevant Orders   Microalbumin, Urine Waived   RESOLVED: Hyperlipidemia associated with type 2  diabetes mellitus (HCC)   Relevant Orders   Lipid Profile  Other   Hyperlipidemia   Chronic.  Goal is LDL <150.  Currently on Rosuvastatin 20mg .  Continue with current medication regimen.  Labs ordered today.  Follow up in 6 months.  Call sooner if concerns arise.       History of stroke   Continue with ASA.  Continue to follow up with Neurology. Work on diet and keeping A1c well controlled.         Follow up plan: Return in about 3 months (around 11/07/2023) for HTN, HLD, DM2 FU.

## 2023-08-09 NOTE — Assessment & Plan Note (Signed)
Chronic.  Improved from prior.  Followed by Neurology.  No longer taking Depakote due to hallucinations.  Continue with PRN tylenol for headaches. Continue to follow up with Neurology.

## 2023-08-09 NOTE — Assessment & Plan Note (Signed)
Chronic.  Controlled.  Continue with current medication regimen of Valsartan 40mg .  Labs ordered today.  Return to clinic in 3 months for reevaluation.  Call sooner if concerns arise.

## 2023-08-09 NOTE — Assessment & Plan Note (Signed)
Chronic.  A1c improved in November to 5.5%.  Sugars are running 115-130.  Eye exam up to date.  Microalbumin done today.  Continue with Trulicity 3mg  and Metformin.  Labs ordered today. Will make recommendations based on results.

## 2023-08-10 ENCOUNTER — Encounter: Payer: Self-pay | Admitting: Oncology

## 2023-08-10 ENCOUNTER — Encounter: Payer: Self-pay | Admitting: Nurse Practitioner

## 2023-08-10 LAB — LIPID PANEL
Chol/HDL Ratio: 3.7 {ratio} (ref 0.0–5.0)
Cholesterol, Total: 123 mg/dL (ref 100–199)
HDL: 33 mg/dL — ABNORMAL LOW (ref >39)
LDL Chol Calc (NIH): 42 mg/dL (ref 0–99)
Triglycerides: 322 mg/dL — ABNORMAL HIGH (ref 0–149)
VLDL Cholesterol Cal: 48 mg/dL — ABNORMAL HIGH (ref 5–40)

## 2023-08-10 LAB — COMPREHENSIVE METABOLIC PANEL
ALT: 38 [IU]/L (ref 0–44)
AST: 30 [IU]/L (ref 0–40)
Albumin: 4.4 g/dL (ref 3.8–4.9)
Alkaline Phosphatase: 127 [IU]/L — ABNORMAL HIGH (ref 44–121)
BUN/Creatinine Ratio: 8 — ABNORMAL LOW (ref 9–20)
BUN: 9 mg/dL (ref 6–24)
Bilirubin Total: 0.8 mg/dL (ref 0.0–1.2)
CO2: 23 mmol/L (ref 20–29)
Calcium: 9 mg/dL (ref 8.7–10.2)
Chloride: 102 mmol/L (ref 96–106)
Creatinine, Ser: 1.12 mg/dL (ref 0.76–1.27)
Globulin, Total: 2.7 g/dL (ref 1.5–4.5)
Glucose: 206 mg/dL — ABNORMAL HIGH (ref 70–99)
Potassium: 4.1 mmol/L (ref 3.5–5.2)
Sodium: 142 mmol/L (ref 134–144)
Total Protein: 7.1 g/dL (ref 6.0–8.5)
eGFR: 79 mL/min/{1.73_m2} (ref >59)

## 2023-08-10 LAB — HEMOGLOBIN A1C
Est. average glucose Bld gHb Est-mCnc: 131 mg/dL
Hgb A1c MFr Bld: 6.2 % — ABNORMAL HIGH (ref 4.8–5.6)

## 2023-08-12 ENCOUNTER — Ambulatory Visit: Payer: Medicaid Other | Attending: Cardiology

## 2023-08-12 DIAGNOSIS — I639 Cerebral infarction, unspecified: Secondary | ICD-10-CM

## 2023-08-12 DIAGNOSIS — Z8673 Personal history of transient ischemic attack (TIA), and cerebral infarction without residual deficits: Secondary | ICD-10-CM

## 2023-08-13 ENCOUNTER — Encounter: Payer: Self-pay | Admitting: Cardiology

## 2023-08-14 DIAGNOSIS — M545 Low back pain, unspecified: Secondary | ICD-10-CM | POA: Diagnosis not present

## 2023-08-16 ENCOUNTER — Encounter: Payer: Self-pay | Admitting: Cardiology

## 2023-08-16 ENCOUNTER — Ambulatory Visit: Payer: Medicaid Other | Attending: Cardiology | Admitting: Cardiology

## 2023-08-16 VITALS — BP 123/87 | HR 92 | Ht 68.0 in | Wt 200.0 lb

## 2023-08-16 DIAGNOSIS — Z8673 Personal history of transient ischemic attack (TIA), and cerebral infarction without residual deficits: Secondary | ICD-10-CM | POA: Diagnosis not present

## 2023-08-16 DIAGNOSIS — I639 Cerebral infarction, unspecified: Secondary | ICD-10-CM

## 2023-08-16 NOTE — Patient Instructions (Signed)
Medication Instructions:  Your physician recommends that you continue on your current medications as directed. Please refer to the Current Medication list given to you today.  *If you need a refill on your cardiac medications before your next appointment, please call your pharmacy*  Follow-Up: At [facility], you and your health needs are our priority.  As part of our continuing mission to provide you with exceptional heart care, we have created designated Provider Care Teams.  These Care Teams include your primary Cardiologist (physician) and Advanced Practice Providers (APPs -  Physician Assistants and Nurse Practitioners) who all work together to provide you with the care you need, when you need it.  Your next appointment:   Call us if you decide that you would like to schedule a loop recorder implant

## 2023-08-16 NOTE — Progress Notes (Unsigned)
Electrophysiology Office Note:   Date:  08/17/2023  ID:  Keith Barber, DOB 1971/06/08, MRN [id number]  Primary Cardiologist: None Electrophysiologist: Nobie Putnam, MD      History of Present Illness:   Keith Barber is a 53 y.o. male with h/o hypertension, diabetes mellitus, mixed dyslipidemia and stroke who is being seen today for evaluation for implantable loop recorder.  Patient presented to PCP in November of 2024 with a chief complaint of blurry vision and disorientation. His PCP ordered an MRI which showed likely subacute enhancing infarcts in the posterior right temporal lobe and right occipital lobe as well as the right thalamus (right PCA territory). He was discharged with a cardiac monitor, which he wore for almost 26 days and did not show atrial fibrillation. He has no known history of atrial fibrillation or significant palpitations. He monitors his HR with a watch. He has recovered from stroke relatively well. He recently required steroids for a strained back muscle which is improving. He otherwise has no new or acute complaints today.   Review of systems complete and found to be negative unless listed in HPI.   EP Information / Studies Reviewed:    EKG is ordered today. Personal review as below.  EKG Interpretation Date/Time:  Tuesday August 16 2023 13:33:08 EST Ventricular Rate:  92 PR Interval:  190 QRS Duration:  92 QT Interval:  366 QTC Calculation: 452 R Axis:   -22  Text Interpretation: Normal sinus rhythm Minimal voltage criteria for LVH, may be normal variant ( R in aVL ) When compared with ECG of 08-Jun-2023 15:54, No significant change was found Confirmed by Nobie Putnam [phone]) on 08/16/2023 2:03:23 PM   Echo 06/04/23:  1. Left ventricular ejection fraction, by estimation, is 60 to 65%. The  left ventricle has normal function. The left ventricle has no regional  wall motion abnormalities. There is mild left ventricular hypertrophy.  Left  ventricular diastolic parameters  are consistent with Grade I diastolic dysfunction (impaired relaxation).   2. Right ventricular systolic function is normal. The right ventricular  size is normal. Tricuspid regurgitation signal is inadequate for assessing  PA pressure.   3. The mitral valve is normal in structure. No evidence of mitral valve  regurgitation. No evidence of mitral stenosis.   4. The aortic valve is normal in structure. There is moderate  calcification of the aortic valve. Aortic valve regurgitation is not  visualized. Moderate aortic valve stenosis. Aortic valve area, by VTI  measures 1.20 cm. Aortic valve mean gradient  measures 23.0 mmHg. Aortic valve Vmax measures 3.00 m/s.   5. The inferior vena cava is normal in size with greater than 50%  respiratory variability, suggesting right atrial pressure of 3 mmHg.   6. Agitated saline contrast bubble study was negative, with no evidence  of any interatrial shunt.    Cardiac Monitor 06/2023: HR 70 - 145, average 93 bpm. There were no sustained or non-sustained arrhythmias.  No atrial fibrillation detected. Occasional supraventricular ectopy, 5%. Rare ventricular ectopy. No sustained arrhythmias. Symptom trigger episodes correspond to sinus rhythm.      Physical Exam:   VS:  BP 123/87 (BP Location: Left Arm, Patient Position: Sitting, Cuff Size: Normal)   Pulse 92   Ht 5\' 8"  (1.727 m)   Wt 200 lb (90.7 kg)   SpO2 95%   BMI 30.41 kg/m    Wt Readings from Last 3 Encounters:  08/16/23 200 lb (90.7 kg)  08/09/23 198 lb (89.8 kg)  06/08/23 193 lb (87.5 kg)     GEN: Well nourished, well developed in no acute distress NECK: No JVD CARDIAC: Normal rate and regular rhythm RESPIRATORY:  Clear to auscultation without rales, wheezing or rhonchi  ABDOMEN: Soft,  non-distended EXTREMITIES:  No edema; No deformity   ASSESSMENT AND PLAN:   Keith Barber is a 53 y.o. male with h/o hypertension, diabetes mellitus,  mixed dyslipidemia and stroke who is being seen today for evaluation for implantable loop recorder.  #Cryptogenic stroke:  - He has completed a 26d monitor with no evidence of atrial fibrillation. Long-term monitoring for occult AF with ILR is reasonable. Explained risks, benefits, and alternatives to ILR implantation in depth with patient and his wife (on phone), including but not limited to bleeding, infection, damage to heart or lungs.  Pt verbalized understanding and would like to think about his options further before making a decision.   Follow up with Dr. Jimmey Ralph  for ILR if patient wishes.   Signed, Nobie Putnam, MD

## 2023-08-18 MED ORDER — ROSUVASTATIN CALCIUM 20 MG PO TABS: 20.0000 mg | ORAL_TABLET | Freq: Every day | ORAL | 1 refills | Status: DC

## 2023-08-20 DIAGNOSIS — Z419 Encounter for procedure for purposes other than remedying health state, unspecified: Secondary | ICD-10-CM | POA: Diagnosis not present

## 2023-08-22 ENCOUNTER — Inpatient Hospital Stay: Payer: Medicaid Other

## 2023-08-22 ENCOUNTER — Inpatient Hospital Stay: Payer: Medicaid Other | Admitting: Oncology

## 2023-08-31 ENCOUNTER — Encounter: Payer: Self-pay | Admitting: Nurse Practitioner

## 2023-09-05 ENCOUNTER — Encounter: Payer: Self-pay | Admitting: Nurse Practitioner

## 2023-09-07 ENCOUNTER — Encounter: Payer: Self-pay | Admitting: Nurse Practitioner

## 2023-09-07 ENCOUNTER — Telehealth (INDEPENDENT_AMBULATORY_CARE_PROVIDER_SITE_OTHER): Payer: Medicaid Other | Admitting: Nurse Practitioner

## 2023-09-07 VITALS — Ht 68.0 in | Wt 195.0 lb

## 2023-09-07 DIAGNOSIS — E1165 Type 2 diabetes mellitus with hyperglycemia: Secondary | ICD-10-CM | POA: Diagnosis not present

## 2023-09-07 DIAGNOSIS — G43109 Migraine with aura, not intractable, without status migrainosus: Secondary | ICD-10-CM

## 2023-09-07 MED ORDER — NURTEC 75 MG PO TBDP: 75.0000 mg | ORAL_TABLET | ORAL | 1 refills | Status: DC | PRN

## 2023-09-07 MED ORDER — TRULICITY 3 MG/0.5ML ~~LOC~~ SOAJ
3.0000 mg | SUBCUTANEOUS | 1 refills | Status: DC
Start: 2023-09-07 — End: 2023-11-01

## 2023-09-07 NOTE — Progress Notes (Signed)
Ht 5\' 8"  (1.727 m)   Wt 195 lb (88.5 kg)   SpO2 95%   BMI 29.65 kg/m    Subjective:    Patient ID: Keith Barber, male    DOB: 1970/10/29, 53 y.o.   MRN: [id number]  HPI: Keith Barber is a 53 y.o. male  Chief Complaint  Patient presents with   Headache    Migraine over the weekend, feeling ready to vomit and requesting meds.   MIGRAINES Patient states he was having a migraine recently.  He woke up at 4-5 am with a headache and took tylenol but it didn't resolve the symptoms.  Since he had the stroke he has been able to take tylenol.  He had nausea and vomiting associated with the headache.  He is not currently having a migraine but wanted something on hand when he does have a migraine.    Relevant past medical, surgical, family and social history reviewed and updated as indicated. Interim medical history since our last visit reviewed. Allergies and medications reviewed and updated.  Review of Systems  Neurological:  Positive for headaches.    Per HPI unless specifically indicated above     Objective:    Ht 5\' 8"  (1.727 m)   Wt 195 lb (88.5 kg)   SpO2 95%   BMI 29.65 kg/m   Wt Readings from Last 3 Encounters:  09/07/23 195 lb (88.5 kg)  08/16/23 200 lb (90.7 kg)  08/09/23 198 lb (89.8 kg)    Physical Exam Vitals and nursing note reviewed.  Constitutional:      General: He is not in acute distress.    Appearance: He is not ill-appearing.  HENT:     Head: Normocephalic.     Right Ear: Hearing normal.     Left Ear: Hearing normal.     Nose: Nose normal.  Pulmonary:     Effort: Pulmonary effort is normal. No respiratory distress.  Neurological:     Mental Status: He is alert.  Psychiatric:        Mood and Affect: Mood normal.        Behavior: Behavior normal.        Thought Content: Thought content normal.        Judgment: Judgment normal.     Results for orders placed or performed in visit on 08/09/23  Comp Met (CMET)   Collection Time:  08/09/23  2:12 PM  Result Value Ref Range   Glucose 206 (H) 70 - 99 mg/dL   BUN 9 6 - 24 mg/dL   Creatinine, Ser 4.09 0.76 - 1.27 mg/dL   eGFR 79 >81 XB/JYN/8.29   BUN/Creatinine Ratio 8 (L) 9 - 20   Sodium 142 134 - 144 mmol/L   Potassium 4.1 3.5 - 5.2 mmol/L   Chloride 102 96 - 106 mmol/L   CO2 23 20 - 29 mmol/L   Calcium 9.0 8.7 - 10.2 mg/dL   Total Protein 7.1 6.0 - 8.5 g/dL   Albumin 4.4 3.8 - 4.9 g/dL   Globulin, Total 2.7 1.5 - 4.5 g/dL   Bilirubin Total 0.8 0.0 - 1.2 mg/dL   Alkaline Phosphatase 127 (H) 44 - 121 IU/L   AST 30 0 - 40 IU/L   ALT 38 0 - 44 IU/L  Lipid Profile   Collection Time: 08/09/23  2:12 PM  Result Value Ref Range   Cholesterol, Total 123 100 - 199 mg/dL   Triglycerides 562 (H) 0 - 149 mg/dL  HDL 33 (L) >39 mg/dL   VLDL Cholesterol Cal 48 (H) 5 - 40 mg/dL   LDL Chol Calc (NIH) 42 0 - 99 mg/dL   Chol/HDL Ratio 3.7 0.0 - 5.0 ratio  HgB A1c   Collection Time: 08/09/23  2:12 PM  Result Value Ref Range   Hgb A1c MFr Bld 6.2 (H) 4.8 - 5.6 %   Est. average glucose Bld gHb Est-mCnc 131 mg/dL  Microalbumin, Urine Waived   Collection Time: 08/09/23  2:50 PM  Result Value Ref Range   Microalb, Ur Waived 30 (H) 0 - 19 mg/L   Creatinine, Urine Waived 200 10 - 300 mg/dL   Microalb/Creat Ratio <30 <30 mg/g      Assessment & Plan:   Problem List Items Addressed This Visit       Cardiovascular and Mediastinum   Migraines - Primary   Chronic.  Exacerbated recently.  Has been on Amitriptyline, Maxalt and Imitrex.  Will start Nurtec PRN for migraine treatment.  Will follow up in 1 month.  Call sooner if concerns arise.       Relevant Medications   Rimegepant Sulfate (NURTEC) 75 MG TBDP     Endocrine   Uncontrolled type 2 diabetes mellitus with hyperglycemia (HCC)   Relevant Medications   Dulaglutide (TRULICITY) 3 MG/0.5ML SOAJ     Follow up plan: No follow-ups on file.  This visit was completed via MyChart due to the restrictions of the COVID-19  pandemic. All issues as above were discussed and addressed. Physical exam was done as above through visual confirmation on MyChart. If it was felt that the patient should be evaluated in the office, they were directed there. The patient verbally consented to this visit. Location of the patient: Home Location of the provider: Office Those involved with this call:  Provider: Larae Grooms, NP CMA: Irene Pap, CMA Front Desk/Registration: Servando Snare This encounter was conducted via video.  I spent 30 dedicated to the care of this patient on the date of this encounter to include previsit review of symptoms, plan of care and follow up, face to face time with the patient, and post visit ordering of testing.

## 2023-09-07 NOTE — Assessment & Plan Note (Signed)
Chronic.  Exacerbated recently.  Has been on Amitriptyline, Maxalt and Imitrex.  Will start Nurtec PRN for migraine treatment.  Will follow up in 1 month.  Call sooner if concerns arise.

## 2023-09-12 DIAGNOSIS — M545 Low back pain, unspecified: Secondary | ICD-10-CM | POA: Diagnosis not present

## 2023-09-12 NOTE — Telephone Encounter (Unsigned)
 Copied from CRM [id number]. Topic: Clinical - Prescription Issue >> Sep 12, 2023 11:41 AM Gildardo Pounds wrote: Reason for CRM: Addison with [employer] is calling to check the status of the prior authorization for Rimegepant Sulfate (NURTEC) 75 MG TBDP. Callback number is [phone] option 1

## 2023-09-17 ENCOUNTER — Encounter: Payer: Self-pay | Admitting: Oncology

## 2023-09-19 ENCOUNTER — Inpatient Hospital Stay: Payer: Medicaid Other

## 2023-09-19 ENCOUNTER — Inpatient Hospital Stay (HOSPITAL_BASED_OUTPATIENT_CLINIC_OR_DEPARTMENT_OTHER): Payer: Medicaid Other | Admitting: Oncology

## 2023-09-19 ENCOUNTER — Inpatient Hospital Stay: Payer: Medicaid Other | Attending: Oncology

## 2023-09-19 VITALS — BP 124/100 | HR 98 | Temp 98.8°F | Ht 68.0 in | Wt 199.6 lb

## 2023-09-19 DIAGNOSIS — Z833 Family history of diabetes mellitus: Secondary | ICD-10-CM | POA: Insufficient documentation

## 2023-09-19 DIAGNOSIS — Z801 Family history of malignant neoplasm of trachea, bronchus and lung: Secondary | ICD-10-CM | POA: Diagnosis not present

## 2023-09-19 DIAGNOSIS — K5730 Diverticulosis of large intestine without perforation or abscess without bleeding: Secondary | ICD-10-CM | POA: Insufficient documentation

## 2023-09-19 DIAGNOSIS — Z87891 Personal history of nicotine dependence: Secondary | ICD-10-CM | POA: Diagnosis not present

## 2023-09-19 DIAGNOSIS — Z860101 Personal history of adenomatous and serrated colon polyps: Secondary | ICD-10-CM | POA: Diagnosis not present

## 2023-09-19 DIAGNOSIS — Z8673 Personal history of transient ischemic attack (TIA), and cerebral infarction without residual deficits: Secondary | ICD-10-CM | POA: Diagnosis not present

## 2023-09-19 DIAGNOSIS — D751 Secondary polycythemia: Secondary | ICD-10-CM | POA: Insufficient documentation

## 2023-09-19 DIAGNOSIS — E785 Hyperlipidemia, unspecified: Secondary | ICD-10-CM | POA: Diagnosis not present

## 2023-09-19 DIAGNOSIS — R5383 Other fatigue: Secondary | ICD-10-CM | POA: Diagnosis not present

## 2023-09-19 DIAGNOSIS — G4730 Sleep apnea, unspecified: Secondary | ICD-10-CM | POA: Insufficient documentation

## 2023-09-19 DIAGNOSIS — R160 Hepatomegaly, not elsewhere classified: Secondary | ICD-10-CM | POA: Insufficient documentation

## 2023-09-19 DIAGNOSIS — Z5986 Financial insecurity: Secondary | ICD-10-CM | POA: Diagnosis not present

## 2023-09-19 DIAGNOSIS — Z8719 Personal history of other diseases of the digestive system: Secondary | ICD-10-CM | POA: Insufficient documentation

## 2023-09-19 DIAGNOSIS — Z888 Allergy status to other drugs, medicaments and biological substances status: Secondary | ICD-10-CM | POA: Insufficient documentation

## 2023-09-19 DIAGNOSIS — K760 Fatty (change of) liver, not elsewhere classified: Secondary | ICD-10-CM | POA: Diagnosis not present

## 2023-09-19 DIAGNOSIS — F32A Depression, unspecified: Secondary | ICD-10-CM | POA: Insufficient documentation

## 2023-09-19 DIAGNOSIS — E119 Type 2 diabetes mellitus without complications: Secondary | ICD-10-CM | POA: Insufficient documentation

## 2023-09-19 DIAGNOSIS — Z8249 Family history of ischemic heart disease and other diseases of the circulatory system: Secondary | ICD-10-CM | POA: Insufficient documentation

## 2023-09-19 DIAGNOSIS — Z79899 Other long term (current) drug therapy: Secondary | ICD-10-CM | POA: Insufficient documentation

## 2023-09-19 DIAGNOSIS — I10 Essential (primary) hypertension: Secondary | ICD-10-CM | POA: Diagnosis not present

## 2023-09-19 LAB — CBC WITH DIFFERENTIAL (CANCER CENTER ONLY)
Abs Immature Granulocytes: 0.04 10*3/uL (ref 0.00–0.07)
Basophils Absolute: 0.1 10*3/uL (ref 0.0–0.1)
Basophils Relative: 1 %
Eosinophils Absolute: 0.1 10*3/uL (ref 0.0–0.5)
Eosinophils Relative: 1 %
HCT: 51.5 % (ref 39.0–52.0)
Hemoglobin: 18 g/dL — ABNORMAL HIGH (ref 13.0–17.0)
Immature Granulocytes: 0 %
Lymphocytes Relative: 33 %
Lymphs Abs: 3 10*3/uL (ref 0.7–4.0)
MCH: 30.1 pg (ref 26.0–34.0)
MCHC: 35 g/dL (ref 30.0–36.0)
MCV: 86 fL (ref 80.0–100.0)
Monocytes Absolute: 0.7 10*3/uL (ref 0.1–1.0)
Monocytes Relative: 8 %
Neutro Abs: 5.3 10*3/uL (ref 1.7–7.7)
Neutrophils Relative %: 57 %
Platelet Count: 230 10*3/uL (ref 150–400)
RBC: 5.99 MIL/uL — ABNORMAL HIGH (ref 4.22–5.81)
RDW: 12.5 % (ref 11.5–15.5)
WBC Count: 9.3 10*3/uL (ref 4.0–10.5)
nRBC: 0 % (ref 0.0–0.2)

## 2023-09-19 NOTE — Progress Notes (Signed)
 Pt here for follow up. Reports that he had 2 mini strokes back in October.

## 2023-09-19 NOTE — Assessment & Plan Note (Addendum)
 Erythrocytosis due to sleep apnea,not using CPAP normal erythropoietin, slightly elevated carbo monoxide level JAK2 V617F mutation negative, with reflex to other mutations CALR, MPL, JAK 2 Ex 12-15 mutations negative. negative BCR-ABL1 FISH.    Labs are reviewed and discussed with patient. Hct <52, hold off phlebotomy.  Encourage CPAP use for treatment of sleep apnea

## 2023-09-19 NOTE — Progress Notes (Signed)
 No phleb today per MD

## 2023-09-19 NOTE — Progress Notes (Signed)
 Hematology/Oncology Progress note [phone] [phone]) [phone]     CHIEF COMPLAINTS/REASON FOR VISIT:  Follow up for erythrocytosis  ASSESSMENT & PLAN:   Erythrocytosis Erythrocytosis due to sleep apnea,not using CPAP normal erythropoietin, slightly elevated carbo monoxide level JAK2 V617F mutation negative, with reflex to other mutations CALR, MPL, JAK 2 Ex 12-15 mutations negative. negative BCR-ABL1 FISH.    Labs are reviewed and discussed with patient. Hct <52, hold off phlebotomy.  Encourage CPAP use for treatment of sleep apnea   Orders Placed This Encounter  Procedures   CBC with Differential (Cancer Center Only)    Standing Status:   Future    Expiration Date:   09/18/2024   Follow up 12 months.  All questions were answered. The patient knows to call the clinic with any problems, questions or concerns.  Rickard Patience, MD, PhD [facility] Hematology Oncology 09/19/2023      HISTORY OF PRESENTING ILLNESS:   Keith Barber is a  53 y.o.  male who presents for follow up of erythrocytosis.    Patient has abnormal CBC with hemoglobin of 18.5, Hct 55,  Reviewed patient's previous labs.  elevated hemoglobin is chronic onset, dated back to 2018.   Patient denies unintentional weight loss, fever, chills, fatigue, night sweats, cough, SOB, chest pain.  Former smoker, 13 pack year smoking history,  Denies previous VTE history. Denies known history of sleep apnea. He denies any testosterone use.   + fatigue, not refreshed when he wakes up in the morning. + nausea, no vomiting, improves as day goes along.  Family history of small cell lung cancer.   08/10/22 CT abdomen pelvis showed   1. No acute abnormality identified within the abdomen or specifically, no abdominal wall hernia visualized. 2. Left-sided colonic diverticulosis without findings of acute diverticulitis. 3. Hepatomegaly with diffuse hepatic steatosis.  INTERVAL HISTORY Keith Barber is a 53 y.o. male who has above history reviewed by me today presents for follow up visit for erythrocytosis.  He has sleep apnea and was recommended to use CPAP machine. He has some difficulty with CPAP mask and has not been using lately  Report having a stressful day today.  BP is 124/100 in clinic.   MEDICAL HISTORY:  Past Medical History:  Diagnosis Date   Acute CVA (cerebrovascular accident) (HCC) 06/04/2023   Adhesive capsulitis of left shoulder 12/15/2017   Anxiety and depression 06/04/2023   Benign neoplasm of rectosigmoid junction    Bilateral inguinal hernia without obstruction or gangrene    Bone spur    LEFT SHOULDER THAT MAKES HIS LEFT ARM TINGLE - had removed   Diverticulitis large intestine 09/2017   Diverticulosis of large intestine without diverticulitis    Dyslipidemia 06/04/2023   Encounter for medication refill 05/29/2023   Erythrocytosis 05/07/2022   Essential hypertension 06/04/2023   GERD (gastroesophageal reflux disease)    H/O migraine with aura 05/29/2023   Hand pain, left 07/09/2020   Hemorrhoids    History of colonic polyps 08/30/2022   History of diverticulitis 12/26/2017   History of tobacco abuse 11/26/2016   Hyperlipidemia    Hyperlipidemia associated with type 2 diabetes mellitus (HCC)    Hypertension    Hypertension associated with diabetes (HCC)    Impingement syndrome of left shoulder region 07/05/2017   Internal hemorrhoids    Labral tear of shoulder, left, initial encounter 11/12/2015   Lateral epicondylitis, left elbow 07/09/2020   Migraines    rare migraines   Ocular migraine with  status migrainosus 03/04/2023   Onychomycosis 12/26/2017   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   Radial nerve compression, left 07/09/2020   Residual hemorrhoidal skin tags    Rotator cuff tendinitis, left 11/12/2015   Rupture of left long head biceps tendon 01/14/2020   Sleep apnea    Stomach irritation    Stress  reaction 05/29/2023   Traumatic ecchymosis of left hand 07/09/2020   Type 2 diabetes mellitus without complications (HCC) 06/04/2023   Umbilical hernia without obstruction and without gangrene    Uncontrolled type 2 diabetes mellitus with hyperglycemia (HCC) 04/18/2018   Vitamin D deficiency 11/02/2022    SURGICAL HISTORY: Past Surgical History:  Procedure Laterality Date   56 HOUR PH STUDY N/A 05/30/2018   Procedure: 24 HOUR PH STUDY;  Surgeon: Pasty Spillers, MD;  Location: [facility] ENDOSCOPY;  Service: Gastroenterology;  Laterality: N/A;   bicep tendon tear      COLONOSCOPY WITH PROPOFOL N/A 02/01/2018   Procedure: COLONOSCOPY WITH PROPOFOL;  Surgeon: Pasty Spillers, MD;  Location: [facility] ENDOSCOPY;  Service: Endoscopy;  Laterality: N/A;   COLONOSCOPY WITH PROPOFOL N/A 08/30/2022   Procedure: COLONOSCOPY WITH PROPOFOL;  Surgeon: Wyline Mood, MD;  Location: [facility] ENDOSCOPY;  Service: Gastroenterology;  Laterality: N/A;   ESOPHAGEAL MANOMETRY N/A 05/30/2018   Procedure: ESOPHAGEAL MANOMETRY (EM);  Surgeon: Pasty Spillers, MD;  Location: [facility] ENDOSCOPY;  Service: Gastroenterology;  Laterality: N/A;   ESOPHAGOGASTRODUODENOSCOPY (EGD) WITH PROPOFOL N/A 02/01/2018   Procedure: ESOPHAGOGASTRODUODENOSCOPY (EGD) WITH PROPOFOL;  Surgeon: Pasty Spillers, MD;  Location: [facility] ENDOSCOPY;  Service: Endoscopy;  Laterality: N/A;   INGUINAL HERNIA REPAIR Bilateral 01/21/2017   Procedure: LAPAROSCOPIC BILATERAL INGUINAL HERNIA REPAIR;  Surgeon: Leafy Ro, MD;  Location: [facility] ORS;  Service: General;  Laterality: Bilateral;   KNEE SURGERY Left    SHOULDER ARTHROSCOPY WITH DISTAL CLAVICLE RESECTION Left 07/05/2017   Procedure: LEFT SHOULDER ARTHROSCOPY, SUBACROMIAL DECOMPRESSION, DISTAL CLAVICLE RESECTION, POSSIBLE MINI OPEN ROTATOR CUFF REPAIR;  Surgeon: Valeria Batman, MD;  Location: [facility] OR;  Service: Orthopedics;  Laterality: Left;   SHOULDER CLOSED REDUCTION Left 12/13/2017    Procedure: LEFT SHOULDER MANIPULATION with steroid injection;  Surgeon: Valeria Batman, MD;  Location: [facility] OR;  Service: Orthopedics;  Laterality: Left;   SHOULDER SURGERY Right    UMBILICAL HERNIA REPAIR N/A 01/21/2017   Procedure: HERNIA REPAIR UMBILICAL ADULT;  Surgeon: Leafy Ro, MD;  Location: [facility] ORS;  Service: General;  Laterality: N/A;    SOCIAL HISTORY: Social History   Socioeconomic History   Marital status: Divorced    Spouse name: Not on file   Number of children: Not on file   Years of education: Not on file   Highest education level: 12th grade  Occupational History   Not on file  Tobacco Use   Smoking status: Former    Current packs/day: 0.00    Average packs/day: 0.5 packs/day for 26.0 years (13.0 ttl pk-yrs)    Types: Cigarettes    Start date: 01/14/1991    Quit date: 01/13/2017    Years since quitting: 6.6   Smokeless tobacco: Never  Vaping Use   Vaping status: Never Used  Substance and Sexual Activity   Alcohol use: No   Drug use: No   Sexual activity: Yes  Other Topics Concern   Not on file  Social History Narrative   Not on file   Social Drivers of Health   Financial Resource Strain: Medium Risk (08/09/2023)   Overall Financial  Resource Strain (CARDIA)    Difficulty of Paying Living Expenses: Somewhat hard  Food Insecurity: No Food Insecurity (08/09/2023)   Hunger Vital Sign    Worried About Running Out of Food in the Last Year: Never true    Ran Out of Food in the Last Year: Never true  Transportation Needs: No Transportation Needs (08/09/2023)   PRAPARE - Administrator, Civil Service (Medical): No    Lack of Transportation (Non-Medical): No  Physical Activity: Insufficiently Active (08/09/2023)   Exercise Vital Sign    Days of Exercise per Week: 7 days    Minutes of Exercise per Session: 20 min  Stress: Stress Concern Present (08/09/2023)   [employer] of Occupational Health - Occupational Stress Questionnaire     Feeling of Stress : To some extent  Social Connections: Moderately Integrated (08/09/2023)   Social Connection and Isolation Panel [NHANES]    Frequency of Communication with Friends and Family: More than three times a week    Frequency of Social Gatherings with Friends and Family: More than three times a week    Attends Religious Services: 1 to 4 times per year    Active Member of [employer] or Organizations: No    Attends Engineer, structural: Not on file    Marital Status: Married  Catering manager Violence: Not At Risk (06/07/2023)   Humiliation, Afraid, Rape, and Kick questionnaire    Fear of Current or Ex-Partner: No    Emotionally Abused: No    Physically Abused: No    Sexually Abused: No    FAMILY HISTORY: Family History  Problem Relation Age of Onset   Diabetes Mother    Migraines Mother    Lung cancer Father    Lung cancer Paternal Grandfather    Cancer Neg Hx    COPD Neg Hx    Heart disease Neg Hx    Stroke Neg Hx    Sleep apnea Neg Hx     ALLERGIES:  is allergic to amlodipine.  MEDICATIONS:  Current Outpatient Medications  Medication Sig Dispense Refill   Accu-Chek Softclix Lancets lancets 1 each by Other route in the morning and at bedtime. Use as instructed 100 each 6   aspirin EC 81 MG tablet Take 1 tablet (81 mg total) by mouth daily. Swallow whole. 30 tablet 12   Blood Glucose Monitoring Suppl (ACCU-CHEK AVIVA PLUS) w/Device KIT 1 each by Does not apply route in the morning and at bedtime. 1 kit 0   cyclobenzaprine (FLEXERIL) 5 MG tablet Take 1 tablet (5 mg total) by mouth daily as needed for muscle spasms. 30 tablet 1   Dulaglutide (TRULICITY) 3 MG/0.5ML SOAJ Inject 3 mg into the skin once a week. 6 mL 1   glucose blood (ACCU-CHEK AVIVA PLUS) test strip 1 each by Other route in the morning and at bedtime. Use as instructed 100 each 6   magnesium chloride (SLOW-MAG) 64 MG TBEC SR tablet Take 1 tablet by mouth daily.     metFORMIN (GLUCOPHAGE) 500 MG  tablet TAKE 2 TABLETS BY MOUTH TWICE DAILY WITH MEALS 360 tablet 0   Omega-3 Fatty Acids (FISH OIL) 1000 MG CAPS Take 2 capsules (2,000 mg total) by mouth 2 (two) times daily.     rosuvastatin (CRESTOR) 20 MG tablet Take 1 tablet (20 mg total) by mouth daily. 90 tablet 1   tiZANidine (ZANAFLEX) 4 MG tablet Take 1 tablet every 8 hours by oral route.     valsartan (DIOVAN)  40 MG tablet Take 1 tablet by mouth once daily 90 tablet 1   Vitamin D, Ergocalciferol, (DRISDOL) 1.25 MG (50000 UNIT) CAPS capsule Take 1 capsule (50,000 Units total) by mouth every 7 (seven) days. 12 capsule 1   amitriptyline (ELAVIL) 10 MG tablet Take 10 mg by mouth at bedtime. (Patient not taking: Reported on 09/19/2023)     naproxen sodium (ALEVE) 220 MG tablet Take 2 tablets by mouth daily. (Patient not taking: Reported on 09/19/2023)     Rimegepant Sulfate (NURTEC) 75 MG TBDP Take 1 tablet (75 mg total) by mouth as needed (Migraine). (Patient not taking: Reported on 09/19/2023) 16 tablet 1   No current facility-administered medications for this visit.     Review of Systems  Constitutional:  Positive for fatigue. Negative for appetite change, chills, fever and unexpected weight change.  HENT:   Negative for hearing loss and voice change.   Eyes:  Negative for eye problems and icterus.  Respiratory:  Negative for chest tightness, cough and shortness of breath.   Cardiovascular:  Negative for chest pain and leg swelling.  Gastrointestinal:  Negative for abdominal distention, abdominal pain and nausea.  Endocrine: Negative for hot flashes.  Genitourinary:  Negative for difficulty urinating, dysuria and frequency.   Musculoskeletal:  Negative for arthralgias.  Skin:  Negative for itching and rash.  Neurological:  Negative for light-headedness and numbness.  Hematological:  Negative for adenopathy. Does not bruise/bleed easily.  Psychiatric/Behavioral:  Negative for confusion.     PHYSICAL EXAMINATION: ECOG PERFORMANCE  STATUS: 0 - Asymptomatic Vitals:   09/19/23 1316 09/19/23 1318  BP: (!) 124/104 (!) 124/100  Pulse: 98   Temp: 98.8 F (37.1 C)    Filed Weights   09/19/23 1316  Weight: 199 lb 9.6 oz (90.5 kg)    Physical Exam Constitutional:      General: He is not in acute distress.    Appearance: He is obese.  HENT:     Head: Normocephalic.  Eyes:     General: No scleral icterus. Cardiovascular:     Rate and Rhythm: Normal rate.  Pulmonary:     Effort: Pulmonary effort is normal. No respiratory distress.  Abdominal:     General: There is no distension.  Musculoskeletal:        General: Normal range of motion.     Cervical back: Normal range of motion.  Skin:    Findings: No rash.  Neurological:     Mental Status: He is alert and oriented to person, place, and time. Mental status is at baseline.     Cranial Nerves: No cranial nerve deficit.  Psychiatric:        Mood and Affect: Mood normal.     LABORATORY DATA:  I have reviewed the data as listed    Latest Ref Rng & Units 09/19/2023    1:03 PM 06/03/2023    6:41 PM 02/21/2023    1:39 PM  CBC  WBC 4.0 - 10.5 K/uL 9.3  10.2  8.8   Hemoglobin 13.0 - 17.0 g/dL 40.9  81.1  91.4   Hematocrit 39.0 - 52.0 % 51.5  52.6  48.6   Platelets 150 - 400 K/uL 230  213  209       Latest Ref Rng & Units 08/09/2023    2:12 PM 06/03/2023    6:41 PM 01/27/2023    9:37 AM  CMP  Glucose 70 - 99 mg/dL 782  956  213   BUN 6 -  24 mg/dL 9  19  14    Creatinine 0.76 - 1.27 mg/dL 1.61  0.96  0.45   Sodium 134 - 144 mmol/L 142  138  140   Potassium 3.5 - 5.2 mmol/L 4.1  3.9  4.7   Chloride 96 - 106 mmol/L 102  108  107   CO2 20 - 29 mmol/L 23  24  20    Calcium 8.7 - 10.2 mg/dL 9.0  8.5  8.6   Total Protein 6.0 - 8.5 g/dL 7.1  6.7  6.3   Total Bilirubin 0.0 - 1.2 mg/dL 0.8  0.7  0.9   Alkaline Phos 44 - 121 IU/L 127  74  91   AST 0 - 40 IU/L 30  25  30    ALT 0 - 44 IU/L 38  26  27      RADIOGRAPHIC STUDIES: I have personally reviewed the  radiological images as listed and agreed with the findings in the report. CARDIAC EVENT MONITOR Result Date: 08/13/2023 HR 70 - 145, average 93 bpm. There were no sustained or non-sustained arrhythmias. No atrial fibrillation detected. Occasional supraventricular ectopy, 5%. Rare ventricular ectopy. No sustained arrhythmias. Symptom trigger episodes correspond to sinus rhythm. Nobie Putnam Cardiac Electrophysiology

## 2023-09-20 ENCOUNTER — Telehealth: Payer: Self-pay

## 2023-09-20 NOTE — Telephone Encounter (Signed)
 PA for Nurtec initiated and submitted via Cover My Meds. Key: [id number]

## 2023-09-21 NOTE — Telephone Encounter (Signed)
 Reached out to Keith Barber to confirm if she got the information needed for it was not to please resend the fax.

## 2023-09-22 ENCOUNTER — Encounter: Payer: Self-pay | Admitting: Nurse Practitioner

## 2023-09-22 MED ORDER — RIZATRIPTAN BENZOATE 10 MG PO TABS: 10.0000 mg | ORAL_TABLET | ORAL | 0 refills | Status: DC | PRN

## 2023-09-22 NOTE — Telephone Encounter (Signed)
 PA has been denied. Is there anything else we can do or send in for the patient? Mychart message sent to provider about this as well.

## 2023-09-22 NOTE — Telephone Encounter (Signed)
 I sent in Maxalt.  This is the recommended medication from Neurology.

## 2023-09-23 NOTE — Telephone Encounter (Signed)
 Patient notified of the change via mychart message.

## 2023-09-24 ENCOUNTER — Encounter: Payer: Self-pay | Admitting: Oncology

## 2023-09-26 ENCOUNTER — Ambulatory Visit: Payer: Self-pay | Admitting: Nurse Practitioner

## 2023-09-26 NOTE — Telephone Encounter (Signed)
 Copied from CRM [id number]. Topic: Clinical - Red Word Triage >> Sep 26, 2023  8:11 AM Payton Doughty wrote: Red Word that prompted transfer to Nurse Triage: headache 2 1/2 days (migraine), blurred vision w/ migraine, elevated heart rate (can't get under 100) high blood press highest  yesterday 151/112 Pt has had severBPal strokes and afraid these are same sx/acid reflux Reason for Disposition  Systolic BP  >= 160 OR Diastolic >= 100  Answer Assessment - Initial Assessment Questions 1. BLOOD PRESSURE: "What is the blood pressure?" "Did you take at least two measurements 5 minutes apart?"     BP is high.   Having a headache 2 1/2 days now.   I just got my migraine rx refilled.   I was trying not to take them.   I only get 10 at a time.  I've taken 2 of them yesterday afternoon.     My BP has been high for 2 days and HR elevated.   2. ONSET: "When did you take your blood pressure?"     Yesterday 151/112.     This morning 137/99.     I took something for acid reflux.   This morning I had acid reflux too.   It helped a little.    Denies having chest pain.    GERD is coming back.  3. HOW: "How did you take your blood pressure?" (e.g., automatic home BP monitor, visiting nurse)     Home monitor.    I have a smart watch.   Oxygen 93-95%.    HR 99 this morning.   BUt it's been 100-105.   4. HISTORY: "Do you have a history of high blood pressure?"     I've had 2 mini strokes.   146/102  P 105 now.   5. MEDICINES: "Are you taking any medicines for blood pressure?" "Have you missed any doses recently?"     Yes I do take medications.     6. OTHER SYMPTOMS: "Do you have any symptoms?" (e.g., blurred vision, chest pain, difficulty breathing, headache, weakness)     Headache, a little dizziness, vision slightly blurry.   I drove to the gas station this morning.   I see a hazy blur when looking at the gas pump.   The 2 strokes I had affected my peripheral vision.    No physical effects from strokes.    7. PREGNANCY:  "Is there any chance you are pregnant?" "When was your last menstrual period?"     N/A  Protocols used: Blood Pressure - High-A-AH  Chief Complaint: headache, GERD, mild dizziness with migraine Symptoms: above Frequency: intermittently Pertinent Negatives: Patient denies N/A Disposition: [] ED /[] Urgent Care (no appt availability in office) / [x] Appointment(In office/virtual)/ []  [facility]/ [] Home Care/ [] Refused Recommended Disposition /[] [facility]/ []  Follow-up with PCP Additional Notes: Appt made with understanding to go to the ED if symptoms change or become worse.   Pt. agreeable

## 2023-09-27 ENCOUNTER — Ambulatory Visit: Admitting: Nurse Practitioner

## 2023-09-27 ENCOUNTER — Encounter: Payer: Self-pay | Admitting: Nurse Practitioner

## 2023-09-27 VITALS — BP 114/87 | HR 102 | Ht 68.0 in | Wt 195.0 lb

## 2023-09-27 DIAGNOSIS — R000 Tachycardia, unspecified: Secondary | ICD-10-CM | POA: Diagnosis not present

## 2023-09-27 DIAGNOSIS — G43109 Migraine with aura, not intractable, without status migrainosus: Secondary | ICD-10-CM

## 2023-09-27 DIAGNOSIS — A084 Viral intestinal infection, unspecified: Secondary | ICD-10-CM | POA: Diagnosis not present

## 2023-09-27 MED ORDER — CYCLOBENZAPRINE HCL 5 MG PO TABS: 5.0000 mg | ORAL_TABLET | Freq: Every day | ORAL | 1 refills | Status: DC | PRN

## 2023-09-27 NOTE — Progress Notes (Signed)
 BP 114/87 (BP Location: Left Arm, Patient Position: Sitting, Cuff Size: Large)   Pulse (!) 102   Ht 5\' 8"  (1.727 m)   Wt 195 lb (88.5 kg)   SpO2 95%   BMI 29.65 kg/m    Subjective:    Patient ID: Keith Barber, male    DOB: 24-Dec-1970, 53 y.o.   MRN: [id number]  HPI: Keith Barber is a 53 y.o. male  Chief Complaint  Patient presents with   Migraine   Hypertension   Gastroesophageal Reflux   Dizziness   Shortness of Breath    Has been going on for a few days     HYPERTENSION / HYPERLIPIDEMIA States his blood pressure and HR were previously elevated.  This morning his blood pressure was back down.  HR is still mildly elevated but patient has been having vomiting and diarrhea over the last 24 hours. Satisfied with current treatment? yes Duration of hypertension: years BP monitoring frequency: not checking BP range:  BP medication side effects: no Past BP meds: valsartan Duration of hyperlipidemia: years Cholesterol medication side effects: no Cholesterol supplements: none Past cholesterol medications: rosuvastatin (crestor) Medication compliance: excellent compliance Aspirin: yes Recent stressors: no Recurrent headaches: no Visual changes: no Palpitations: no Dyspnea: no Chest pain: no Lower extremity edema: no Dizzy/lightheaded: no  MIGRAINES Patient was not able to get Nurtec due to insurance.  He was able to pick up the Maxalt.  He has had to take 2-3 already.  He was not able to take the depakote due to hallucinations.    He feels like he recently had food poisoning or a stomach bug recently which exacerbated his headaches.    Relevant past medical, surgical, family and social history reviewed and updated as indicated. Interim medical history since our last visit reviewed. Allergies and medications reviewed and updated.  Review of Systems  Eyes:  Negative for visual disturbance.  Respiratory:  Negative for shortness of breath.    Cardiovascular:  Negative for chest pain and leg swelling.  Gastrointestinal:  Positive for diarrhea, nausea and vomiting.  Neurological:  Positive for headaches. Negative for light-headedness.    Per HPI unless specifically indicated above     Objective:    BP 114/87 (BP Location: Left Arm, Patient Position: Sitting, Cuff Size: Large)   Pulse (!) 102   Ht 5\' 8"  (1.727 m)   Wt 195 lb (88.5 kg)   SpO2 95%   BMI 29.65 kg/m   Wt Readings from Last 3 Encounters:  09/27/23 195 lb (88.5 kg)  09/19/23 199 lb 9.6 oz (90.5 kg)  09/07/23 195 lb (88.5 kg)    Physical Exam Vitals and nursing note reviewed.  Constitutional:      General: He is not in acute distress.    Appearance: Normal appearance. He is not ill-appearing, toxic-appearing or diaphoretic.  HENT:     Head: Normocephalic.     Right Ear: External ear normal.     Left Ear: External ear normal.     Nose: Nose normal. No congestion or rhinorrhea.     Mouth/Throat:     Mouth: Mucous membranes are moist.  Eyes:     General:        Right eye: No discharge.        Left eye: No discharge.     Extraocular Movements: Extraocular movements intact.     Conjunctiva/sclera: Conjunctivae normal.     Pupils: Pupils are equal, round, and reactive to light.  Cardiovascular:  Rate and Rhythm: Normal rate and regular rhythm.     Heart sounds: No murmur heard. Pulmonary:     Effort: Pulmonary effort is normal. No respiratory distress.     Breath sounds: Normal breath sounds. No wheezing, rhonchi or rales.  Abdominal:     General: Abdomen is flat. Bowel sounds are normal.  Musculoskeletal:     Cervical back: Normal range of motion and neck supple.  Skin:    General: Skin is warm and dry.     Capillary Refill: Capillary refill takes less than 2 seconds.  Neurological:     General: No focal deficit present.     Mental Status: He is alert and oriented to person, place, and time.  Psychiatric:        Mood and Affect: Mood  normal.        Behavior: Behavior normal.        Thought Content: Thought content normal.        Judgment: Judgment normal.     Results for orders placed or performed in visit on 09/19/23  CBC with Differential (Cancer Center Only)   Collection Time: 09/19/23  1:03 PM  Result Value Ref Range   WBC Count 9.3 4.0 - 10.5 K/uL   RBC 5.99 (H) 4.22 - 5.81 MIL/uL   Hemoglobin 18.0 (H) 13.0 - 17.0 g/dL   HCT 21.3 08.6 - 57.8 %   MCV 86.0 80.0 - 100.0 fL   MCH 30.1 26.0 - 34.0 pg   MCHC 35.0 30.0 - 36.0 g/dL   RDW 46.9 62.9 - 52.8 %   Platelet Count 230 150 - 400 K/uL   nRBC 0.0 0.0 - 0.2 %   Neutrophils Relative % 57 %   Neutro Abs 5.3 1.7 - 7.7 K/uL   Lymphocytes Relative 33 %   Lymphs Abs 3.0 0.7 - 4.0 K/uL   Monocytes Relative 8 %   Monocytes Absolute 0.7 0.1 - 1.0 K/uL   Eosinophils Relative 1 %   Eosinophils Absolute 0.1 0.0 - 0.5 K/uL   Basophils Relative 1 %   Basophils Absolute 0.1 0.0 - 0.1 K/uL   Immature Granulocytes 0 %   Abs Immature Granulocytes 0.04 0.00 - 0.07 K/uL      Assessment & Plan:   Problem List Items Addressed This Visit       Cardiovascular and Mediastinum   Migraines - Primary   Chronic.  Nurtec was not covered. Maxalt was sent in.  He did not tolerate the Depakote.  Recommend patient follow up with Neurology for management of migraines after stroke.       Relevant Medications   cyclobenzaprine (FLEXERIL) 5 MG tablet   Other Visit Diagnoses       Viral gastroenteritis       Suspect patient has recently had viral gastroenteritis.  Recommend hydrating well.     Tachycardia       Suspect HR is elevated in the setting of viral gastroenteritis.  Recommend hydrating well.  Keep follow up for 2 weeks for reevaluation.         Follow up plan: No follow-ups on file.

## 2023-09-27 NOTE — Assessment & Plan Note (Signed)
 Chronic.  Nurtec was not covered. Maxalt was sent in.  He did not tolerate the Depakote.  Recommend patient follow up with Neurology for management of migraines after stroke.

## 2023-09-29 ENCOUNTER — Ambulatory Visit: Payer: Self-pay | Admitting: Podiatry

## 2023-09-29 ENCOUNTER — Encounter: Payer: Self-pay | Admitting: Oncology

## 2023-10-04 ENCOUNTER — Ambulatory Visit (INDEPENDENT_AMBULATORY_CARE_PROVIDER_SITE_OTHER): Admitting: Podiatry

## 2023-10-04 ENCOUNTER — Ambulatory Visit (INDEPENDENT_AMBULATORY_CARE_PROVIDER_SITE_OTHER)

## 2023-10-04 ENCOUNTER — Other Ambulatory Visit: Payer: Self-pay | Admitting: Podiatry

## 2023-10-04 DIAGNOSIS — S9032XA Contusion of left foot, initial encounter: Secondary | ICD-10-CM | POA: Diagnosis not present

## 2023-10-04 DIAGNOSIS — M76822 Posterior tibial tendinitis, left leg: Secondary | ICD-10-CM

## 2023-10-04 NOTE — Progress Notes (Signed)
 Subjective:  Patient ID: Keith Barber, male    DOB: Dec 12, 1970,  MRN: [id number]  Chief Complaint  Patient presents with   Foot Pain    Left ankle pain    53 y.o. male presents with the above complaint.  Patient presents with left ankle posterior tibial tendinitis/peroneal tendinitis.  Patient states painful to touch is progressive gotten worse worse with ambulation worse with pressure on the inside of the foot started hurting first then started spreading to the outside of the foot.  He mostly works as a Hospital doctor.  Denies any other acute, pain scale 7 out of 10 dull aching nature   Review of Systems: Negative except as noted in the HPI. Denies N/V/F/Ch.  Past Medical History:  Diagnosis Date   Acute CVA (cerebrovascular accident) (HCC) 06/04/2023   Adhesive capsulitis of left shoulder 12/15/2017   Anxiety and depression 06/04/2023   Benign neoplasm of rectosigmoid junction    Bilateral inguinal hernia without obstruction or gangrene    Bone spur    LEFT SHOULDER THAT MAKES HIS LEFT ARM TINGLE - had removed   Diverticulitis large intestine 09/2017   Diverticulosis of large intestine without diverticulitis    Dyslipidemia 06/04/2023   Encounter for medication refill 05/29/2023   Erythrocytosis 05/07/2022   Essential hypertension 06/04/2023   GERD (gastroesophageal reflux disease)    H/O migraine with aura 05/29/2023   Hand pain, left 07/09/2020   Hemorrhoids    History of colonic polyps 08/30/2022   History of diverticulitis 12/26/2017   History of tobacco abuse 11/26/2016   Hyperlipidemia    Hyperlipidemia associated with type 2 diabetes mellitus (HCC)    Hypertension    Hypertension associated with diabetes (HCC)    Impingement syndrome of left shoulder region 07/05/2017   Internal hemorrhoids    Labral tear of shoulder, left, initial encounter 11/12/2015   Lateral epicondylitis, left elbow 07/09/2020   Migraines    rare migraines   Ocular migraine with status  migrainosus 03/04/2023   Onychomycosis 12/26/2017   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   Radial nerve compression, left 07/09/2020   Residual hemorrhoidal skin tags    Rotator cuff tendinitis, left 11/12/2015   Rupture of left long head biceps tendon 01/14/2020   Sleep apnea    Stomach irritation    Stress reaction 05/29/2023   Traumatic ecchymosis of left hand 07/09/2020   Type 2 diabetes mellitus without complications (HCC) 06/04/2023   Umbilical hernia without obstruction and without gangrene    Uncontrolled type 2 diabetes mellitus with hyperglycemia (HCC) 04/18/2018   Vitamin D deficiency 11/02/2022    Current Outpatient Medications:    Accu-Chek Softclix Lancets lancets, 1 each by Other route in the morning and at bedtime. Use as instructed, Disp: 100 each, Rfl: 6   amitriptyline (ELAVIL) 10 MG tablet, Take 10 mg by mouth at bedtime. (Patient not taking: Reported on 09/27/2023), Disp: , Rfl:    aspirin EC 81 MG tablet, Take 1 tablet (81 mg total) by mouth daily. Swallow whole., Disp: 30 tablet, Rfl: 12   Blood Glucose Monitoring Suppl (ACCU-CHEK AVIVA PLUS) w/Device KIT, 1 each by Does not apply route in the morning and at bedtime., Disp: 1 kit, Rfl: 0   cyclobenzaprine (FLEXERIL) 5 MG tablet, Take 1 tablet (5 mg total) by mouth daily as needed for muscle spasms., Disp: 30 tablet, Rfl: 1   Dulaglutide (TRULICITY) 3 MG/0.5ML SOAJ, Inject 3 mg into the skin once a week., Disp: 6 mL, Rfl:  1   glucose blood (ACCU-CHEK AVIVA PLUS) test strip, 1 each by Other route in the morning and at bedtime. Use as instructed, Disp: 100 each, Rfl: 6   magnesium chloride (SLOW-MAG) 64 MG TBEC SR tablet, Take 1 tablet by mouth daily., Disp: , Rfl:    metFORMIN (GLUCOPHAGE) 500 MG tablet, TAKE 2 TABLETS BY MOUTH TWICE DAILY WITH MEALS, Disp: 360 tablet, Rfl: 0   naproxen sodium (ALEVE) 220 MG tablet, Take 2 tablets by mouth daily. (Patient not taking: Reported on 09/19/2023),  Disp: , Rfl:    Omega-3 Fatty Acids (FISH OIL) 1000 MG CAPS, Take 2 capsules (2,000 mg total) by mouth 2 (two) times daily., Disp: , Rfl:    rizatriptan (MAXALT) 10 MG tablet, Take 1 tablet (10 mg total) by mouth as needed for migraine. May repeat in 2 hours if needed, Disp: 10 tablet, Rfl: 0   rosuvastatin (CRESTOR) 20 MG tablet, Take 1 tablet (20 mg total) by mouth daily., Disp: 90 tablet, Rfl: 1   tiZANidine (ZANAFLEX) 4 MG tablet, Take 1 tablet every 8 hours by oral route., Disp: , Rfl:    valsartan (DIOVAN) 40 MG tablet, Take 1 tablet by mouth once daily, Disp: 90 tablet, Rfl: 1   Vitamin D, Ergocalciferol, (DRISDOL) 1.25 MG (50000 UNIT) CAPS capsule, Take 1 capsule (50,000 Units total) by mouth every 7 (seven) days., Disp: 12 capsule, Rfl: 1  Social History   Tobacco Use  Smoking Status Former   Current packs/day: 0.00   Average packs/day: 0.5 packs/day for 26.0 years (13.0 ttl pk-yrs)   Types: Cigarettes   Start date: 01/14/1991   Quit date: 01/13/2017   Years since quitting: 6.7  Smokeless Tobacco Never    Allergies  Allergen Reactions   Amlodipine Itching   Objective:  There were no vitals filed for this visit. There is no height or weight on file to calculate BMI. Constitutional Well developed. Well nourished.  Vascular Dorsalis pedis pulses palpable bilaterally. Posterior tibial pulses palpable bilaterally. Capillary refill normal to all digits.  No cyanosis or clubbing noted. Pedal hair growth normal.  Neurologic Normal speech. Oriented to person, place, and time. Epicritic sensation to light touch grossly present bilaterally.  Dermatologic Nails well groomed and normal in appearance. No open wounds. No skin lesions.  Orthopedic: Pain on palpation along the course of the posterior tibial tendon pain along the course of the peroneal tendon mild pain on palpation no pain at the Achilles tendon.  No pain at the ATFL ligament   Radiographs: 3 views of skeletally  mature adult left ankle: No fractures noted mild midfoot arthritis noted no other bony abnormalities identified Assessment:   1. Contusion of left foot, initial encounter   2. Posterior tibial tendinitis, left    Plan:  Patient was evaluated and treated and all questions answered.  Left peroneal tendinitis/posterior tibial tendinitis -All questions and concerns were discussed with the patient extensive detail given the amount of pain that he is having he will benefit from cam boot immobilization Cam boot was dispensed.  I will allow this to immobilize the ankle to allow the soft tissue inflammation to calm down.  If there is still some residual pain he will benefit from steroid injection during next visit  No follow-ups on file.

## 2023-10-05 ENCOUNTER — Encounter: Payer: Self-pay | Admitting: Oncology

## 2023-10-06 DIAGNOSIS — G43009 Migraine without aura, not intractable, without status migrainosus: Secondary | ICD-10-CM | POA: Diagnosis not present

## 2023-10-06 DIAGNOSIS — I63531 Cerebral infarction due to unspecified occlusion or stenosis of right posterior cerebral artery: Secondary | ICD-10-CM | POA: Diagnosis not present

## 2023-10-06 DIAGNOSIS — G4733 Obstructive sleep apnea (adult) (pediatric): Secondary | ICD-10-CM | POA: Diagnosis not present

## 2023-10-17 LAB — HM DIABETES EYE EXAM

## 2023-10-18 DIAGNOSIS — H5213 Myopia, bilateral: Secondary | ICD-10-CM | POA: Diagnosis not present

## 2023-10-31 ENCOUNTER — Other Ambulatory Visit: Payer: Self-pay | Admitting: Nurse Practitioner

## 2023-10-31 DIAGNOSIS — E1165 Type 2 diabetes mellitus with hyperglycemia: Secondary | ICD-10-CM

## 2023-11-01 ENCOUNTER — Telehealth: Payer: Self-pay | Admitting: Nurse Practitioner

## 2023-11-01 DIAGNOSIS — E1165 Type 2 diabetes mellitus with hyperglycemia: Secondary | ICD-10-CM

## 2023-11-01 MED ORDER — TRULICITY 3 MG/0.5ML ~~LOC~~ SOAJ: 3.0000 mg | SUBCUTANEOUS | 1 refills | Status: DC

## 2023-11-01 NOTE — Telephone Encounter (Signed)
 Patient walked into the office requesting a refill on his Trulicity.  Refill sent in today.

## 2023-11-01 NOTE — Telephone Encounter (Signed)
 Too soon for refills.  Requested Prescriptions  Pending Prescriptions Disp Refills   TRULICITY 3 MG/0.5ML SOAJ [Pharmacy Med Name: Trulicity 3 MG/0.5ML Subcutaneous Solution Pen-injector] 4 mL 0    Sig: INJECT 3 MG INTO THE SKIN ONCE A WEEK.     Endocrinology:  Diabetes - GLP-1 Receptor Agonists Passed - 11/01/2023 12:01 PM      Passed - HBA1C is between 0 and 7.9 and within 180 days    HB A1C (BAYER DCA - WAIVED)  Date Value Ref Range Status  01/27/2023 7.0 (H) 4.8 - 5.6 % Final    Comment:             Prediabetes: 5.7 - 6.4          Diabetes: >6.4          Glycemic control for adults with diabetes: <7.0    Hgb A1c MFr Bld  Date Value Ref Range Status  08/09/2023 6.2 (H) 4.8 - 5.6 % Final    Comment:             Prediabetes: 5.7 - 6.4          Diabetes: >6.4          Glycemic control for adults with diabetes: <7.0          Passed - Valid encounter within last 6 months    Recent Outpatient Visits           1 month ago Migraine with aura and without status migrainosus, not intractable   [facility] Aileen Alexanders, NP   1 month ago Migraine with aura and without status migrainosus, not intractable   [facility] Aileen Alexanders, NP       Future Appointments             In 2 weeks Aileen Alexanders, NP [facility]

## 2023-11-03 ENCOUNTER — Ambulatory Visit: Admitting: Podiatry

## 2023-11-08 ENCOUNTER — Ambulatory Visit: Admitting: Podiatry

## 2023-11-08 ENCOUNTER — Ambulatory Visit (INDEPENDENT_AMBULATORY_CARE_PROVIDER_SITE_OTHER)

## 2023-11-08 DIAGNOSIS — M7662 Achilles tendinitis, left leg: Secondary | ICD-10-CM | POA: Diagnosis not present

## 2023-11-08 DIAGNOSIS — M7672 Peroneal tendinitis, left leg: Secondary | ICD-10-CM

## 2023-11-08 DIAGNOSIS — M76822 Posterior tibial tendinitis, left leg: Secondary | ICD-10-CM

## 2023-11-08 NOTE — Progress Notes (Signed)
 Subjective:  Patient ID: Keith Barber, male    DOB: 1971-05-01,  MRN: [id number]  Chief Complaint  Patient presents with   Foot Pain    53 y.o. male presents with the above complaint.  Patient presents with left ankle posterior tibial tendinitis/peroneal tendinitis.  Patient states painful to touch is progressive gotten worse worse with ambulation worse with pressure on the inside of the foot started hurting first then started spreading to the outside of the foot.  He mostly works as a Hospital doctor.  Denies any other acute, pain scale 7 out of 10 dull aching nature   Review of Systems: Negative except as noted in the HPI. Denies N/V/F/Ch.  Past Medical History:  Diagnosis Date   Acute CVA (cerebrovascular accident) (HCC) 06/04/2023   Adhesive capsulitis of left shoulder 12/15/2017   Anxiety and depression 06/04/2023   Benign neoplasm of rectosigmoid junction    Bilateral inguinal hernia without obstruction or gangrene    Bone spur    LEFT SHOULDER THAT MAKES HIS LEFT ARM TINGLE - had removed   Diverticulitis large intestine 09/2017   Diverticulosis of large intestine without diverticulitis    Dyslipidemia 06/04/2023   Encounter for medication refill 05/29/2023   Erythrocytosis 05/07/2022   Essential hypertension 06/04/2023   GERD (gastroesophageal reflux disease)    H/O migraine with aura 05/29/2023   Hand pain, left 07/09/2020   Hemorrhoids    History of colonic polyps 08/30/2022   History of diverticulitis 12/26/2017   History of tobacco abuse 11/26/2016   Hyperlipidemia    Hyperlipidemia associated with type 2 diabetes mellitus (HCC)    Hypertension    Hypertension associated with diabetes (HCC)    Impingement syndrome of left shoulder region 07/05/2017   Internal hemorrhoids    Labral tear of shoulder, left, initial encounter 11/12/2015   Lateral epicondylitis, left elbow 07/09/2020   Migraines    rare migraines   Ocular migraine with status migrainosus 03/04/2023    Onychomycosis 12/26/2017   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   Radial nerve compression, left 07/09/2020   Residual hemorrhoidal skin tags    Rotator cuff tendinitis, left 11/12/2015   Rupture of left long head biceps tendon 01/14/2020   Sleep apnea    Stomach irritation    Stress reaction 05/29/2023   Traumatic ecchymosis of left hand 07/09/2020   Type 2 diabetes mellitus without complications (HCC) 06/04/2023   Umbilical hernia without obstruction and without gangrene    Uncontrolled type 2 diabetes mellitus with hyperglycemia (HCC) 04/18/2018   Vitamin D  deficiency 11/02/2022    Current Outpatient Medications:    Accu-Chek Softclix Lancets lancets, 1 each by Other route in the morning and at bedtime. Use as instructed, Disp: 100 each, Rfl: 6   amitriptyline  (ELAVIL ) 10 MG tablet, Take 10 mg by mouth at bedtime. (Patient not taking: Reported on 09/27/2023), Disp: , Rfl:    aspirin  EC 81 MG tablet, Take 1 tablet (81 mg total) by mouth daily. Swallow whole., Disp: 30 tablet, Rfl: 12   Blood Glucose Monitoring Suppl (ACCU-CHEK AVIVA PLUS) w/Device KIT, 1 each by Does not apply route in the morning and at bedtime., Disp: 1 kit, Rfl: 0   cyclobenzaprine  (FLEXERIL ) 5 MG tablet, Take 1 tablet (5 mg total) by mouth daily as needed for muscle spasms., Disp: 30 tablet, Rfl: 1   Dulaglutide  (TRULICITY ) 3 MG/0.5ML SOAJ, Inject 3 mg into the skin once a week., Disp: 6 mL, Rfl: 1   glucose blood (ACCU-CHEK  AVIVA PLUS) test strip, 1 each by Other route in the morning and at bedtime. Use as instructed, Disp: 100 each, Rfl: 6   magnesium  chloride (SLOW-MAG) 64 MG TBEC SR tablet, Take 1 tablet by mouth daily., Disp: , Rfl:    metFORMIN  (GLUCOPHAGE ) 500 MG tablet, TAKE 2 TABLETS BY MOUTH TWICE DAILY WITH MEALS, Disp: 360 tablet, Rfl: 0   naproxen sodium (ALEVE) 220 MG tablet, Take 2 tablets by mouth daily. (Patient not taking: Reported on 09/19/2023), Disp: , Rfl:    Omega-3  Fatty Acids (FISH OIL ) 1000 MG CAPS, Take 2 capsules (2,000 mg total) by mouth 2 (two) times daily., Disp: , Rfl:    rizatriptan  (MAXALT ) 10 MG tablet, Take 1 tablet (10 mg total) by mouth as needed for migraine. May repeat in 2 hours if needed, Disp: 10 tablet, Rfl: 0   rosuvastatin  (CRESTOR ) 20 MG tablet, Take 1 tablet (20 mg total) by mouth daily., Disp: 90 tablet, Rfl: 1   tiZANidine (ZANAFLEX) 4 MG tablet, Take 1 tablet every 8 hours by oral route., Disp: , Rfl:    valsartan  (DIOVAN ) 40 MG tablet, Take 1 tablet by mouth once daily, Disp: 90 tablet, Rfl: 1   Vitamin D , Ergocalciferol , (DRISDOL ) 1.25 MG (50000 UNIT) CAPS capsule, Take 1 capsule (50,000 Units total) by mouth every 7 (seven) days., Disp: 12 capsule, Rfl: 1  Social History   Tobacco Use  Smoking Status Former   Current packs/day: 0.00   Average packs/day: 0.5 packs/day for 26.0 years (13.0 ttl pk-yrs)   Types: Cigarettes   Start date: 01/14/1991   Quit date: 01/13/2017   Years since quitting: 6.8  Smokeless Tobacco Never    Allergies  Allergen Reactions   Amlodipine  Itching   Objective:  There were no vitals filed for this visit. There is no height or weight on file to calculate BMI. Constitutional Well developed. Well nourished.  Vascular Dorsalis pedis pulses palpable bilaterally. Posterior tibial pulses palpable bilaterally. Capillary refill normal to all digits.  No cyanosis or clubbing noted. Pedal hair growth normal.  Neurologic Normal speech. Oriented to person, place, and time. Epicritic sensation to light touch grossly present bilaterally.  Dermatologic Nails well groomed and normal in appearance. No open wounds. No skin lesions.  Orthopedic: Pain on palpation along the course of the posterior tibial tendon pain along the course of the peroneal tendon mild pain on palpation no pain at the Achilles tendon.  No pain at the ATFL ligament   Radiographs: 3 views of skeletally mature adult left ankle: No  fractures noted mild midfoot arthritis noted no other bony abnormalities identified Assessment:   1. Posterior tibial tendinitis, left    Plan:  Patient was evaluated and treated and all questions answered.  Left peroneal tendinitis/posterior tibial tendinitis -All questions and concerns were discussed with the patient extensive detail clinically cam boot immobilization decrease above his pain he still has primary pain on the lateral aspect of his foot.  I discussed with the patient given the amount of pain that he is having he will benefit from a steroid injection to help decrease acute inflammatory component associate with pain.  Patient agrees with plan like to proceed with steroid injection -A steroid injection was performed at left lateral foot a point of maximal tenderness using 1% plain Lidocaine  and none mg of Kenalog. This was well tolerated. - Given that he is still continue to hurt he will benefit from MRI - MRI was ordered -   No follow-ups  on file.

## 2023-11-10 ENCOUNTER — Encounter: Payer: Self-pay | Admitting: Podiatry

## 2023-11-11 ENCOUNTER — Encounter: Payer: Self-pay | Admitting: Podiatry

## 2023-11-12 ENCOUNTER — Other Ambulatory Visit

## 2023-11-15 ENCOUNTER — Ambulatory Visit: Payer: Self-pay | Admitting: Nurse Practitioner

## 2023-11-15 NOTE — Progress Notes (Deleted)
 There were no vitals taken for this visit.   Subjective:    Patient ID: Keith Barber, male    DOB: 25-Mar-1971, 53 y.o.   MRN: [id number]  HPI: Keith Barber is a 53 y.o. male  No chief complaint on file.  DIABETES Patient states his vision has been a lot better since decreasing the Metformin  dose.  Trulicity  3mg  weekly  and tolerating it well.  He is also taking Metformin  500mg  BID. Hypoglycemic episodes: no Polydipsia/polyuria: yes Visual disturbance: no Chest pain: no Paresthesias: no Glucose Monitoring: yes  Accucheck frequency: daily  Fasting glucose: 115-130  Post prandial:  Evening:  Before meals: Taking Insulin ?: no  Long acting insulin :  Short acting insulin : Blood Pressure Monitoring: daily Retinal Examination: Up to Date Foot Exam: Up to Date Diabetic Education: Not Completed Pneumovax: Up to Date Influenza: Up to Date Aspirin : no  HYPERTENSION / HYPERLIPIDEMIA Satisfied with current treatment? yes Duration of hypertension: years BP monitoring frequency: not checking BP range:  BP medication side effects: no Past BP meds: valsartan  Duration of hyperlipidemia: years Cholesterol medication side effects: no Cholesterol supplements: none Past cholesterol medications: rosuvastatin  (crestor ) Medication compliance: excellent compliance Aspirin : yes Recent stressors: no Recurrent headaches: no Visual changes: no Palpitations: no Dyspnea: no Chest pain: no Lower extremity edema: no Dizzy/lightheaded: no  MIGRAINES Patient stopped the Depakote  due to hallucinations.  He has not been taking the maxalt  since the stroke in October.  He has had some headaches and he is only taking 1 tylenol .     STROKE HISTORY He is taking ASA daily and following up with Neurology.    SLEEP APNEA Has a full face mask which causes a lot of mouth sores because he is a mouth breather. Hasn't used it in 6 months.   Sleep apnea status: uncontrolled Duration:  months Satisfied with current treatment?:  no CPAP use:  no Sleep quality with CPAP use:  not using Treament compliance:poor compliance    Relevant past medical, surgical, family and social history reviewed and updated as indicated. Interim medical history since our last visit reviewed. Allergies and medications reviewed and updated.  Review of Systems  Eyes:  Negative for visual disturbance.  Respiratory:  Negative for chest tightness and shortness of breath.   Cardiovascular:  Negative for chest pain, palpitations and leg swelling.  Endocrine: Positive for polyuria. Negative for polydipsia.  Neurological:  Positive for headaches. Negative for dizziness, light-headedness and numbness.    Per HPI unless specifically indicated above     Objective:    There were no vitals taken for this visit.  Wt Readings from Last 3 Encounters:  09/27/23 195 lb (88.5 kg)  09/19/23 199 lb 9.6 oz (90.5 kg)  09/07/23 195 lb (88.5 kg)    Physical Exam Vitals and nursing note reviewed.  Constitutional:      General: He is not in acute distress.    Appearance: Normal appearance. He is not ill-appearing, toxic-appearing or diaphoretic.  HENT:     Head: Normocephalic.     Right Ear: External ear normal.     Left Ear: External ear normal.     Nose: Nose normal. No congestion or rhinorrhea.     Mouth/Throat:     Mouth: Mucous membranes are moist.  Eyes:     General:        Right eye: No discharge.        Left eye: No discharge.     Extraocular Movements: Extraocular movements intact.  Conjunctiva/sclera: Conjunctivae normal.     Pupils: Pupils are equal, round, and reactive to light.  Cardiovascular:     Rate and Rhythm: Normal rate and regular rhythm.     Heart sounds: No murmur heard. Pulmonary:     Effort: Pulmonary effort is normal. No respiratory distress.     Breath sounds: Normal breath sounds. No wheezing, rhonchi or rales.  Abdominal:     General: Abdomen is flat. Bowel  sounds are normal.  Musculoskeletal:     Cervical back: Normal range of motion and neck supple.  Skin:    General: Skin is warm and dry.     Capillary Refill: Capillary refill takes less than 2 seconds.  Neurological:     General: No focal deficit present.     Mental Status: He is alert and oriented to person, place, and time.  Psychiatric:        Mood and Affect: Mood normal.        Behavior: Behavior normal.        Thought Content: Thought content normal.        Judgment: Judgment normal.    Results for orders placed or performed in visit on 10/20/23  HM DIABETES EYE EXAM   Collection Time: 10/17/23 11:26 AM  Result Value Ref Range   HM Diabetic Eye Exam No Retinopathy No Retinopathy      Assessment & Plan:   Problem List Items Addressed This Visit       Cardiovascular and Mediastinum   Essential hypertension     Endocrine   Uncontrolled type 2 diabetes mellitus with hyperglycemia (HCC) - Primary   Diabetes mellitus treated with injections of non-insulin  medication (HCC)     Other   Hyperlipidemia     Follow up plan: No follow-ups on file.

## 2023-11-18 ENCOUNTER — Encounter: Payer: Self-pay | Admitting: Podiatry

## 2023-11-19 ENCOUNTER — Other Ambulatory Visit

## 2023-11-22 DIAGNOSIS — R0981 Nasal congestion: Secondary | ICD-10-CM | POA: Diagnosis not present

## 2023-11-22 DIAGNOSIS — G4733 Obstructive sleep apnea (adult) (pediatric): Secondary | ICD-10-CM | POA: Diagnosis not present

## 2023-11-22 DIAGNOSIS — Z789 Other specified health status: Secondary | ICD-10-CM | POA: Diagnosis not present

## 2023-11-22 DIAGNOSIS — Z6830 Body mass index (BMI) 30.0-30.9, adult: Secondary | ICD-10-CM | POA: Diagnosis not present

## 2023-11-23 ENCOUNTER — Encounter: Payer: Self-pay | Admitting: Podiatry

## 2023-12-01 ENCOUNTER — Other Ambulatory Visit: Payer: Self-pay | Admitting: Nurse Practitioner

## 2023-12-02 NOTE — Telephone Encounter (Signed)
 Requested Prescriptions  Pending Prescriptions Disp Refills   valsartan  (DIOVAN ) 40 MG tablet [Pharmacy Med Name: Valsartan  40 MG Oral Tablet] 90 tablet 0    Sig: Take 1 tablet by mouth once daily     Cardiovascular:  Angiotensin Receptor Blockers Passed - 12/02/2023  1:52 PM      Passed - Cr in normal range and within 180 days    Creatinine, Ser  Date Value Ref Range Status  08/09/2023 1.12 0.76 - 1.27 mg/dL Final         Passed - K in normal range and within 180 days    Potassium  Date Value Ref Range Status  08/09/2023 4.1 3.5 - 5.2 mmol/L Final         Passed - Patient is not pregnant      Passed - Last BP in normal range    BP Readings from Last 1 Encounters:  09/27/23 114/87         Passed - Valid encounter within last 6 months    Recent Outpatient Visits           2 months ago Migraine with aura and without status migrainosus, not intractable   [place] [facility] Aileen Alexanders, NP   2 months ago Migraine with aura and without status migrainosus, not intractable   [place] [facility] Aileen Alexanders, NP

## 2023-12-07 DIAGNOSIS — M25532 Pain in left wrist: Secondary | ICD-10-CM | POA: Diagnosis not present

## 2023-12-23 DIAGNOSIS — J3489 Other specified disorders of nose and nasal sinuses: Secondary | ICD-10-CM | POA: Diagnosis not present

## 2023-12-23 DIAGNOSIS — G4733 Obstructive sleep apnea (adult) (pediatric): Secondary | ICD-10-CM | POA: Diagnosis not present

## 2023-12-23 DIAGNOSIS — Z6830 Body mass index (BMI) 30.0-30.9, adult: Secondary | ICD-10-CM | POA: Diagnosis not present

## 2023-12-23 DIAGNOSIS — Z789 Other specified health status: Secondary | ICD-10-CM | POA: Diagnosis not present

## 2023-12-26 ENCOUNTER — Encounter: Payer: Self-pay | Admitting: Podiatry

## 2023-12-27 ENCOUNTER — Other Ambulatory Visit: Payer: Self-pay | Admitting: Podiatry

## 2023-12-27 DIAGNOSIS — M7712 Lateral epicondylitis, left elbow: Secondary | ICD-10-CM | POA: Diagnosis not present

## 2023-12-27 DIAGNOSIS — M25532 Pain in left wrist: Secondary | ICD-10-CM | POA: Diagnosis not present

## 2023-12-27 DIAGNOSIS — M7672 Peroneal tendinitis, left leg: Secondary | ICD-10-CM

## 2023-12-27 DIAGNOSIS — G5622 Lesion of ulnar nerve, left upper limb: Secondary | ICD-10-CM | POA: Diagnosis not present

## 2023-12-27 DIAGNOSIS — M76822 Posterior tibial tendinitis, left leg: Secondary | ICD-10-CM

## 2023-12-29 ENCOUNTER — Encounter: Payer: Self-pay | Admitting: Podiatry

## 2024-01-05 ENCOUNTER — Other Ambulatory Visit

## 2024-01-07 ENCOUNTER — Ambulatory Visit
Admission: RE | Admit: 2024-01-07 | Discharge: 2024-01-07 | Disposition: A | Discharge status: Home / Self Care | Source: Ambulatory Visit | Attending: Podiatry

## 2024-01-07 DIAGNOSIS — M7672 Peroneal tendinitis, left leg: Secondary | ICD-10-CM

## 2024-01-07 DIAGNOSIS — M76822 Posterior tibial tendinitis, left leg: Secondary | ICD-10-CM

## 2024-01-12 ENCOUNTER — Ambulatory Visit (INDEPENDENT_AMBULATORY_CARE_PROVIDER_SITE_OTHER): Admitting: Podiatry

## 2024-01-12 DIAGNOSIS — M76822 Posterior tibial tendinitis, left leg: Secondary | ICD-10-CM

## 2024-01-12 DIAGNOSIS — M7672 Peroneal tendinitis, left leg: Secondary | ICD-10-CM

## 2024-01-12 DIAGNOSIS — M7751 Other enthesopathy of right foot: Secondary | ICD-10-CM

## 2024-01-12 MED ORDER — CYCLOBENZAPRINE HCL 10 MG PO TABS: 10.0000 mg | ORAL_TABLET | Freq: Three times a day (TID) | ORAL | 0 refills | Status: DC | PRN

## 2024-01-12 NOTE — Progress Notes (Signed)
 Subjective:  Patient ID: Keith Barber, male    DOB: March 04, 1971,  MRN: [id number]  Chief Complaint  Patient presents with   Foot Pain    53 y.o. male presents with the above complaint.  Patient presents for follow-up of left posterior tibial tendinitis/peroneal tendinitis here to go over the MRI results still bothers him a little bit.  He has secondary complaint of right ankle pain.  He denies any other acute complaints he would like to discuss treatment options for the ankle pain as well   Review of Systems: Negative except as noted in the HPI. Denies N/V/F/Ch.  Past Medical History:  Diagnosis Date   Acute CVA (cerebrovascular accident) (HCC) 06/04/2023   Adhesive capsulitis of left shoulder 12/15/2017   Anxiety and depression 06/04/2023   Benign neoplasm of rectosigmoid junction    Bilateral inguinal hernia without obstruction or gangrene    Bone spur    LEFT SHOULDER THAT MAKES HIS LEFT ARM TINGLE - had removed   Diverticulitis large intestine 09/2017   Diverticulosis of large intestine without diverticulitis    Dyslipidemia 06/04/2023   Encounter for medication refill 05/29/2023   Erythrocytosis 05/07/2022   Essential hypertension 06/04/2023   GERD (gastroesophageal reflux disease)    H/O migraine with aura 05/29/2023   Hand pain, left 07/09/2020   Hemorrhoids    History of colonic polyps 08/30/2022   History of diverticulitis 12/26/2017   History of tobacco abuse 11/26/2016   Hyperlipidemia    Hyperlipidemia associated with type 2 diabetes mellitus (HCC)    Hypertension    Hypertension associated with diabetes (HCC)    Impingement syndrome of left shoulder region 07/05/2017   Internal hemorrhoids    Labral tear of shoulder, left, initial encounter 11/12/2015   Lateral epicondylitis, left elbow 07/09/2020   Migraines    rare migraines   Ocular migraine with status migrainosus 03/04/2023   Onychomycosis 12/26/2017   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/2017   Radial nerve compression, left 07/09/2020   Residual hemorrhoidal skin tags    Rotator cuff tendinitis, left 11/12/2015   Rupture of left long head biceps tendon 01/14/2020   Sleep apnea    Stomach irritation    Stress reaction 05/29/2023   Traumatic ecchymosis of left hand 07/09/2020   Type 2 diabetes mellitus without complications (HCC) 06/04/2023   Umbilical hernia without obstruction and without gangrene    Uncontrolled type 2 diabetes mellitus with hyperglycemia (HCC) 04/18/2018   Vitamin D  deficiency 11/02/2022    Current Outpatient Medications:    cyclobenzaprine  (FLEXERIL ) 10 MG tablet, Take 1 tablet (10 mg total) by mouth 3 (three) times daily as needed for muscle spasms., Disp: 30 tablet, Rfl: 0   Accu-Chek Softclix Lancets lancets, 1 each by Other route in the morning and at bedtime. Use as instructed, Disp: 100 each, Rfl: 6   amitriptyline  (ELAVIL ) 10 MG tablet, Take 10 mg by mouth at bedtime. (Patient not taking: Reported on 09/27/2023), Disp: , Rfl:    aspirin  EC 81 MG tablet, Take 1 tablet (81 mg total) by mouth daily. Swallow whole., Disp: 30 tablet, Rfl: 12   Blood Glucose Monitoring Suppl (ACCU-CHEK AVIVA PLUS) w/Device KIT, 1 each by Does not apply route in the morning and at bedtime., Disp: 1 kit, Rfl: 0   cyclobenzaprine  (FLEXERIL ) 5 MG tablet, Take 1 tablet (5 mg total) by mouth daily as needed for muscle spasms., Disp: 30 tablet, Rfl: 1   Dulaglutide  (TRULICITY ) 3 MG/0.5ML SOAJ, Inject 3  mg into the skin once a week., Disp: 6 mL, Rfl: 1   glucose blood (ACCU-CHEK AVIVA PLUS) test strip, 1 each by Other route in the morning and at bedtime. Use as instructed, Disp: 100 each, Rfl: 6   magnesium  chloride (SLOW-MAG) 64 MG TBEC SR tablet, Take 1 tablet by mouth daily., Disp: , Rfl:    metFORMIN  (GLUCOPHAGE ) 500 MG tablet, TAKE 2 TABLETS BY MOUTH TWICE DAILY WITH MEALS, Disp: 360 tablet, Rfl: 0   naproxen sodium (ALEVE) 220 MG tablet,  Take 2 tablets by mouth daily. (Patient not taking: Reported on 09/19/2023), Disp: , Rfl:    Omega-3 Fatty Acids (FISH OIL ) 1000 MG CAPS, Take 2 capsules (2,000 mg total) by mouth 2 (two) times daily., Disp: , Rfl:    rizatriptan  (MAXALT ) 10 MG tablet, Take 1 tablet (10 mg total) by mouth as needed for migraine. May repeat in 2 hours if needed, Disp: 10 tablet, Rfl: 0   rosuvastatin  (CRESTOR ) 20 MG tablet, Take 1 tablet (20 mg total) by mouth daily., Disp: 90 tablet, Rfl: 1   tiZANidine (ZANAFLEX) 4 MG tablet, Take 1 tablet every 8 hours by oral route., Disp: , Rfl:    valsartan  (DIOVAN ) 40 MG tablet, Take 1 tablet by mouth once daily, Disp: 90 tablet, Rfl: 0   Vitamin D , Ergocalciferol , (DRISDOL ) 1.25 MG (50000 UNIT) CAPS capsule, Take 1 capsule (50,000 Units total) by mouth every 7 (seven) days., Disp: 12 capsule, Rfl: 1  Social History   Tobacco Use  Smoking Status Former   Current packs/day: 0.00   Average packs/day: 0.5 packs/day for 26.0 years (13.0 ttl pk-yrs)   Types: Cigarettes   Start date: 01/14/1991   Quit date: 01/13/2017   Years since quitting: 7.0  Smokeless Tobacco Never    Allergies  Allergen Reactions   Amlodipine  Itching   Objective:  There were no vitals filed for this visit. There is no height or weight on file to calculate BMI. Constitutional Well developed. Well nourished.  Vascular Dorsalis pedis pulses palpable bilaterally. Posterior tibial pulses palpable bilaterally. Capillary refill normal to all digits.  No cyanosis or clubbing noted. Pedal hair growth normal.  Neurologic Normal speech. Oriented to person, place, and time. Epicritic sensation to light touch grossly present bilaterally.  Dermatologic Nails well groomed and normal in appearance. No open wounds. No skin lesions.  Orthopedic: Pain on palpation along the course of the posterior tibial tendon pain along the course of the peroneal tendon mild pain on palpation no pain at the Achilles tendon.   No pain at the ATFL ligament  Right ankle pain hurts with ambulation and deep intra-articular ankle pain noted.  No crepitus noted   Radiographs: 3 views of skeletally mature adult left ankle: No fractures noted mild midfoot arthritis noted no other bony abnormalities identified Assessment:   1. Posterior tibial tendinitis, left    Plan:  Patient was evaluated and treated and all questions answered.  Left peroneal tendinitis/posterior tibial tendinitis - All questions and concerns were discussed with the patient in extensive detail - MRI did not show any pathology.  At this time patient would like to try physical therapy with dry needling dry needling prescription with physical therapy was given to the patient  Right ankle capsulitis - Given the amount of pain that is having he will benefit from steroid injection help decrease of inflammatory components of joint pain.  Patient agrees with plan like to proceed with steroid injection -A steroid injection was performed at  right ankle using 1% plain Lidocaine  and 10 mg of Kenalog. This was well tolerated.    No follow-ups on file.

## 2024-01-19 ENCOUNTER — Ambulatory Visit: Admitting: Podiatry

## 2024-02-06 ENCOUNTER — Other Ambulatory Visit: Payer: Self-pay | Admitting: Nurse Practitioner

## 2024-02-08 NOTE — Telephone Encounter (Signed)
 Requested Prescriptions  Pending Prescriptions Disp Refills   rosuvastatin  (CRESTOR ) 20 MG tablet [Pharmacy Med Name: Rosuvastatin  Calcium  20 MG Oral Tablet] 90 tablet 0    Sig: Take 1 tablet by mouth once daily     Cardiovascular:  Antilipid - Statins 2 Failed - 02/08/2024  2:21 PM      Failed - Lipid Panel in normal range within the last 12 months    Cholesterol, Total  Date Value Ref Range Status  08/09/2023 123 100 - 199 mg/dL Final   LDL Chol Calc (NIH)  Date Value Ref Range Status  08/09/2023 42 0 - 99 mg/dL Final   HDL  Date Value Ref Range Status  08/09/2023 33 (L) >39 mg/dL Final   Triglycerides  Date Value Ref Range Status  08/09/2023 322 (H) 0 - 149 mg/dL Final         Passed - Cr in normal range and within 360 days    Creatinine, Ser  Date Value Ref Range Status  08/09/2023 1.12 0.76 - 1.27 mg/dL Final         Passed - Patient is not pregnant      Passed - Valid encounter within last 12 months    Recent Outpatient Visits           4 months ago Migraine with aura and without status migrainosus, not intractable   [place] [facility] [place], Darice, NP   5 months ago Migraine with aura and without status migrainosus, not intractable    [facility] Melvin Darice, NP       Future Appointments             In 4 weeks Revankar, Jennifer SAUNDERS, MD [facility]

## 2024-02-14 ENCOUNTER — Encounter: Payer: Self-pay | Admitting: Podiatry

## 2024-02-14 ENCOUNTER — Ambulatory Visit: Admitting: Nurse Practitioner

## 2024-02-14 ENCOUNTER — Encounter: Payer: Self-pay | Admitting: Nurse Practitioner

## 2024-02-14 VITALS — BP 109/67 | HR 87 | Temp 97.9°F | Ht 68.0 in | Wt 190.8 lb

## 2024-02-14 DIAGNOSIS — G43109 Migraine with aura, not intractable, without status migrainosus: Secondary | ICD-10-CM | POA: Diagnosis not present

## 2024-02-14 DIAGNOSIS — E119 Type 2 diabetes mellitus without complications: Secondary | ICD-10-CM | POA: Diagnosis not present

## 2024-02-14 DIAGNOSIS — I10 Essential (primary) hypertension: Secondary | ICD-10-CM | POA: Diagnosis not present

## 2024-02-14 DIAGNOSIS — E782 Mixed hyperlipidemia: Secondary | ICD-10-CM | POA: Diagnosis not present

## 2024-02-14 DIAGNOSIS — Z7985 Long-term (current) use of injectable non-insulin antidiabetic drugs: Secondary | ICD-10-CM | POA: Diagnosis not present

## 2024-02-14 DIAGNOSIS — E1165 Type 2 diabetes mellitus with hyperglycemia: Secondary | ICD-10-CM | POA: Diagnosis not present

## 2024-02-14 MED ORDER — CYCLOBENZAPRINE HCL 10 MG PO TABS
10.0000 mg | ORAL_TABLET | Freq: Three times a day (TID) | ORAL | 0 refills | Status: DC | PRN
Start: 2024-02-14 — End: 2024-04-02

## 2024-02-14 NOTE — Assessment & Plan Note (Signed)
 Chronic.  Goal is LDL <150.  Currently on Rosuvastatin 20mg .  Continue with current medication regimen.  Labs ordered today.  Follow up in 6 months.  Call sooner if concerns arise.

## 2024-02-14 NOTE — Assessment & Plan Note (Signed)
 Chronic, controlled.  Last A1c in January.  Metformin  is likely the cause of patient's diarrhea.  Will stop metformin .  Can increase Trulicity  to 4.5mg  weekly if needed.  Labs ordered.  Eye exam up to date. Will follow up in 3 months.  Call sooner if concerns arise.
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
## 2024-02-14 NOTE — Assessment & Plan Note (Signed)
 Chronic.  Controlled.  Continue with current medication regimen of valsartan .  Labs ordered today.  Return to clinic in 6 months for reevaluation.  Call sooner if concerns arise.

## 2024-02-14 NOTE — Assessment & Plan Note (Signed)
 Chronic. Followed by Neurology.  Continue with Nurtec.  Recommend speaking with Neurologist about using Nurtec for prevention.

## 2024-02-15 ENCOUNTER — Ambulatory Visit: Payer: Self-pay | Admitting: Nurse Practitioner

## 2024-02-15 LAB — COMPREHENSIVE METABOLIC PANEL WITH GFR
ALT: 30 IU/L (ref 0–44)
AST: 41 IU/L — ABNORMAL HIGH (ref 0–40)
Albumin: 4.3 g/dL (ref 3.8–4.9)
Alkaline Phosphatase: 103 IU/L (ref 44–121)
BUN/Creatinine Ratio: 12 (ref 9–20)
BUN: 13 mg/dL (ref 6–24)
Bilirubin Total: 1.2 mg/dL (ref 0.0–1.2)
CO2: 20 mmol/L (ref 20–29)
Calcium: 9.3 mg/dL (ref 8.7–10.2)
Chloride: 102 mmol/L (ref 96–106)
Creatinine, Ser: 1.08 mg/dL (ref 0.76–1.27)
Globulin, Total: 2.6 g/dL (ref 1.5–4.5)
Glucose: 127 mg/dL — ABNORMAL HIGH (ref 70–99)
Potassium: 3.9 mmol/L (ref 3.5–5.2)
Sodium: 137 mmol/L (ref 134–144)
Total Protein: 6.9 g/dL (ref 6.0–8.5)
eGFR: 82 mL/min/1.73 (ref >59)

## 2024-02-15 LAB — LIPID PANEL
Chol/HDL Ratio: 3.3 ratio (ref 0.0–5.0)
Cholesterol, Total: 127 mg/dL (ref 100–199)
HDL: 39 mg/dL — ABNORMAL LOW (ref >39)
LDL Chol Calc (NIH): 58 mg/dL (ref 0–99)
Triglycerides: 181 mg/dL — ABNORMAL HIGH (ref 0–149)
VLDL Cholesterol Cal: 30 mg/dL (ref 5–40)

## 2024-02-15 LAB — HEMOGLOBIN A1C
Est. average glucose Bld gHb Est-mCnc: 151 mg/dL
Hgb A1c MFr Bld: 6.9 % — ABNORMAL HIGH (ref 4.8–5.6)

## 2024-02-15 MED ORDER — TRULICITY 4.5 MG/0.5ML ~~LOC~~ SOAJ: 4.5000 mg | SUBCUTANEOUS | 1 refills | Status: DC

## 2024-02-15 NOTE — Addendum Note (Signed)
 Addended by: MELVIN PAO on: 02/15/2024 08:51 AM   Modules accepted: Orders

## 2024-02-21 ENCOUNTER — Ambulatory Visit: Admitting: Podiatry

## 2024-02-21 DIAGNOSIS — M76822 Posterior tibial tendinitis, left leg: Secondary | ICD-10-CM

## 2024-02-21 DIAGNOSIS — M7672 Peroneal tendinitis, left leg: Secondary | ICD-10-CM

## 2024-02-21 DIAGNOSIS — M7751 Other enthesopathy of right foot: Secondary | ICD-10-CM

## 2024-02-21 NOTE — Progress Notes (Signed)
 Subjective:  Patient ID: Keith Barber, male    DOB: 06-Jun-1971,  MRN: [id number]  Chief Complaint  Patient presents with   Foot Pain    Pt stated that he is still having some pain     53 y.o. male presents with the above complaint.  Patient presents for follow-up of left posterior tibial tendinitis/peroneal tendinitis here to go over the MRI results still bothers him a little bit.  He has secondary complaint of right ankle pain.  He denies any other acute complaints he would like to discuss treatment options for the ankle pain as well   Review of Systems: Negative except as noted in the HPI. Denies N/V/F/Ch.  Past Medical History:  Diagnosis Date   Acute CVA (cerebrovascular accident) (HCC) 06/04/2023   Adhesive capsulitis of left shoulder 12/15/2017   Anxiety and depression 06/04/2023   Benign neoplasm of rectosigmoid junction    Bilateral inguinal hernia without obstruction or gangrene    Bone spur    LEFT SHOULDER THAT MAKES HIS LEFT ARM TINGLE - had removed   Diverticulitis large intestine 09/2017   Diverticulosis of large intestine without diverticulitis    Dyslipidemia 06/04/2023   Encounter for medication refill 05/29/2023   Erythrocytosis 05/07/2022   Essential hypertension 06/04/2023   GERD (gastroesophageal reflux disease)    H/O migraine with aura 05/29/2023   Hand pain, left 07/09/2020   Hemorrhoids    History of colonic polyps 08/30/2022   History of diverticulitis 12/26/2017   History of tobacco abuse 11/26/2016   Hyperlipidemia    Hyperlipidemia associated with type 2 diabetes mellitus (HCC)    Hypertension    Hypertension associated with diabetes (HCC)    Impingement syndrome of left shoulder region 07/05/2017   Internal hemorrhoids    Labral tear of shoulder, left, initial encounter 11/12/2015   Lateral epicondylitis, left elbow 07/09/2020   Migraines    rare migraines   Ocular migraine with status migrainosus 03/04/2023   Onychomycosis  12/26/2017   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   Radial nerve compression, left 07/09/2020   Residual hemorrhoidal skin tags    Rotator cuff tendinitis, left 11/12/2015   Rupture of left long head biceps tendon 01/14/2020   Sleep apnea    Stomach irritation    Stress reaction 05/29/2023   Traumatic ecchymosis of left hand 07/09/2020   Type 2 diabetes mellitus without complications (HCC) 06/04/2023   Umbilical hernia without obstruction and without gangrene    Uncontrolled type 2 diabetes mellitus with hyperglycemia (HCC) 04/18/2018   Vitamin D  deficiency 11/02/2022    Current Outpatient Medications:    Accu-Chek Softclix Lancets lancets, 1 each by Other route in the morning and at bedtime. Use as instructed, Disp: 100 each, Rfl: 6   aspirin  EC 81 MG tablet, Take 1 tablet (81 mg total) by mouth daily. Swallow whole., Disp: 30 tablet, Rfl: 12   Blood Glucose Monitoring Suppl (ACCU-CHEK AVIVA PLUS) w/Device KIT, 1 each by Does not apply route in the morning and at bedtime., Disp: 1 kit, Rfl: 0   cyclobenzaprine  (FLEXERIL ) 10 MG tablet, Take 1 tablet (10 mg total) by mouth 3 (three) times daily as needed for muscle spasms., Disp: 30 tablet, Rfl: 0   Dulaglutide  (TRULICITY ) 4.5 MG/0.5ML SOAJ, Inject 4.5 mg as directed once a week., Disp: 6 mL, Rfl: 1   glucose blood (ACCU-CHEK AVIVA PLUS) test strip, 1 each by Other route in the morning and at bedtime. Use as instructed, Disp: 100 each,  Rfl: 6   magnesium  chloride (SLOW-MAG) 64 MG TBEC SR tablet, Take 1 tablet by mouth daily., Disp: , Rfl:    Omega-3 Fatty Acids (FISH OIL ) 1000 MG CAPS, Take 2 capsules (2,000 mg total) by mouth 2 (two) times daily., Disp: , Rfl:    Rimegepant Sulfate (NURTEC) 75 MG TBDP, Take 75 mg by mouth as needed., Disp: , Rfl:    rosuvastatin  (CRESTOR ) 20 MG tablet, Take 1 tablet by mouth once daily, Disp: 90 tablet, Rfl: 0   tiZANidine (ZANAFLEX) 4 MG tablet, Take 1 tablet every 8 hours by  oral route., Disp: , Rfl:    valsartan  (DIOVAN ) 40 MG tablet, Take 1 tablet by mouth once daily, Disp: 90 tablet, Rfl: 0   Vitamin D , Ergocalciferol , (DRISDOL ) 1.25 MG (50000 UNIT) CAPS capsule, Take 1 capsule (50,000 Units total) by mouth every 7 (seven) days., Disp: 12 capsule, Rfl: 1  Social History   Tobacco Use  Smoking Status Former   Current packs/day: 0.00   Average packs/day: 0.5 packs/day for 26.0 years (13.0 ttl pk-yrs)   Types: Cigarettes   Start date: 01/14/1991   Quit date: 01/13/2017   Years since quitting: 7.1  Smokeless Tobacco Never    Allergies  Allergen Reactions   Amlodipine  Itching   Objective:  There were no vitals filed for this visit. There is no height or weight on file to calculate BMI. Constitutional Well developed. Well nourished.  Vascular Dorsalis pedis pulses palpable bilaterally. Posterior tibial pulses palpable bilaterally. Capillary refill normal to all digits.  No cyanosis or clubbing noted. Pedal hair growth normal.  Neurologic Normal speech. Oriented to person, place, and time. Epicritic sensation to light touch grossly present bilaterally.  Dermatologic Nails well groomed and normal in appearance. No open wounds. No skin lesions.  Orthopedic: Pain on palpation along the course of the posterior tibial tendon pain along the course of the peroneal tendon mild pain on palpation no pain at the Achilles tendon.  No pain at the ATFL ligament  Right ankle pain hurts with ambulation and deep intra-articular ankle pain noted.  No crepitus noted   Radiographs: 3 views of skeletally mature adult left ankle: No fractures noted mild midfoot arthritis noted no other bony abnormalities identified Assessment:   1. Posterior tibial tendinitis, left   2. Peroneal tendinitis, left   3. Capsulitis of right ankle    Plan:  Patient was evaluated and treated and all questions answered.  Left peroneal tendinitis/posterior tibial tendinitis - All  questions and concerns were discussed with the patient in extensive detail - MRI did not show any pathology.  At this time patient would like to try physical therapy with dry needling dry needling prescription with physical therapy was given to the patient  Right ankle capsulitis - Given the amount of pain that is having he will benefit from steroid injection help decrease of inflammatory components of joint pain.  Patient agrees with plan like to proceed with steroid injection -A steroid injection was performed at right ankle using 1% plain Lidocaine  and 10 mg of Kenalog. This was well tolerated.    No follow-ups on file.

## 2024-02-22 ENCOUNTER — Encounter: Payer: Self-pay | Admitting: Physical Therapy

## 2024-02-22 ENCOUNTER — Ambulatory Visit: Attending: Podiatry | Admitting: Physical Therapy

## 2024-02-22 DIAGNOSIS — M25571 Pain in right ankle and joints of right foot: Secondary | ICD-10-CM | POA: Diagnosis not present

## 2024-02-22 DIAGNOSIS — M7751 Other enthesopathy of right foot: Secondary | ICD-10-CM | POA: Diagnosis not present

## 2024-02-22 DIAGNOSIS — M25572 Pain in left ankle and joints of left foot: Secondary | ICD-10-CM | POA: Insufficient documentation

## 2024-02-22 DIAGNOSIS — M7672 Peroneal tendinitis, left leg: Secondary | ICD-10-CM | POA: Diagnosis not present

## 2024-02-22 DIAGNOSIS — M76822 Posterior tibial tendinitis, left leg: Secondary | ICD-10-CM | POA: Diagnosis not present

## 2024-02-22 DIAGNOSIS — M6281 Muscle weakness (generalized): Secondary | ICD-10-CM | POA: Diagnosis not present

## 2024-02-22 DIAGNOSIS — M25671 Stiffness of right ankle, not elsewhere classified: Secondary | ICD-10-CM | POA: Diagnosis not present

## 2024-02-22 DIAGNOSIS — R269 Unspecified abnormalities of gait and mobility: Secondary | ICD-10-CM | POA: Diagnosis not present

## 2024-02-22 NOTE — Therapy (Signed)
 OUTPATIENT PHYSICAL THERAPY LOWER EXTREMITY EVALUATION  Patient Name: Keith Barber MRN: [id number] DOB:01-15-1971, 53 y.o., male Today's Date: 02/22/2024  END OF SESSION:  PT End of Session - 02/22/24 0906     Visit Number 1    Number of Visits 12    Date for PT Re-Evaluation 04/04/24    PT Start Time 0901    PT Stop Time 0959    PT Time Calculation (min) 58 min          Past Medical History:  Diagnosis Date   Acute CVA (cerebrovascular accident) (HCC) 06/04/2023   Adhesive capsulitis of left shoulder 12/15/2017   Anxiety and depression 06/04/2023   Benign neoplasm of rectosigmoid junction    Bilateral inguinal hernia without obstruction or gangrene    Bone spur    LEFT SHOULDER THAT MAKES HIS LEFT ARM TINGLE - had removed   Diverticulitis large intestine 09/2017   Diverticulosis of large intestine without diverticulitis    Dyslipidemia 06/04/2023   Encounter for medication refill 05/29/2023   Erythrocytosis 05/07/2022   Essential hypertension 06/04/2023   GERD (gastroesophageal reflux disease)    H/O migraine with aura 05/29/2023   Hand pain, left 07/09/2020   Hemorrhoids    History of colonic polyps 08/30/2022   History of diverticulitis 12/26/2017   History of tobacco abuse 11/26/2016   Hyperlipidemia    Hyperlipidemia associated with type 2 diabetes mellitus (HCC)    Hypertension    Hypertension associated with diabetes (HCC)    Impingement syndrome of left shoulder region 07/05/2017   Internal hemorrhoids    Labral tear of shoulder, left, initial encounter 11/12/2015   Lateral epicondylitis, left elbow 07/09/2020   Migraines    rare migraines   Ocular migraine with status migrainosus 03/04/2023   Onychomycosis 12/26/2017   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   Radial nerve compression, left 07/09/2020   Residual hemorrhoidal skin tags    Rotator cuff tendinitis, left 11/12/2015   Rupture of left long head biceps tendon  01/14/2020   Sleep apnea    Stomach irritation    Stress reaction 05/29/2023   Traumatic ecchymosis of left hand 07/09/2020   Type 2 diabetes mellitus without complications (HCC) 06/04/2023   Umbilical hernia without obstruction and without gangrene    Uncontrolled type 2 diabetes mellitus with hyperglycemia (HCC) 04/18/2018   Vitamin D  deficiency 11/02/2022   Past Surgical History:  Procedure Laterality Date   24 HOUR PH STUDY N/A 05/30/2018   Procedure: 24 HOUR PH STUDY;  Surgeon: Janalyn Keene NOVAK, MD;  Location: [facility];  Service: Gastroenterology;  Laterality: N/A;   bicep tendon tear      COLONOSCOPY WITH PROPOFOL  N/A 02/01/2018   Procedure: COLONOSCOPY WITH PROPOFOL ;  Surgeon: Janalyn Keene NOVAK, MD;  Location: [facility] ENDOSCOPY;  Service: Endoscopy;  Laterality: N/A;   COLONOSCOPY WITH PROPOFOL  N/A 08/30/2022   Procedure: COLONOSCOPY WITH PROPOFOL ;  Surgeon: Therisa Bi, MD;  Location: [facility] ENDOSCOPY;  Service: Gastroenterology;  Laterality: N/A;   ESOPHAGEAL MANOMETRY N/A 05/30/2018   Procedure: ESOPHAGEAL MANOMETRY (EM);  Surgeon: Janalyn Keene NOVAK, MD;  Location: [facility] ENDOSCOPY;  Service: Gastroenterology;  Laterality: N/A;   ESOPHAGOGASTRODUODENOSCOPY (EGD) WITH PROPOFOL  N/A 02/01/2018   Procedure: ESOPHAGOGASTRODUODENOSCOPY (EGD) WITH PROPOFOL ;  Surgeon: Janalyn Keene NOVAK, MD;  Location: [facility] ENDOS[facility];  Service: Endoscopy;  Laterality: N/A;   HERNIA REPAIR     INGUINAL HERNIA REPAIR Bilateral 01/21/2017   Procedure: LAPAROSCOPIC BILATERAL INGUINAL HERNIA REPAIR;  Surgeon: Jordis Laneta FALCON, MD;  Location: [facility] ORS;  Service: General;  Laterality: Bilateral;   KNEE SURGERY Left    SHOULDER ARTHROSCOPY WITH DISTAL CLAVICLE RESECTION Left 07/05/2017   Procedure: LEFT SHOULDER ARTHROSCOPY, SUBACROMIAL DECOMPRESSION, DISTAL CLAVICLE RESECTION, POSSIBLE MINI OPEN ROTATOR CUFF REPAIR;  Surgeon: Anderson Maude ORN, MD;  Location: [facility] OR;  Service: Orthopedics;   Laterality: Left;   SHOULDER CLOSED REDUCTION Left 12/13/2017   Procedure: LEFT SHOULDER MANIPULATION with steroid injection;  Surgeon: Anderson Maude ORN, MD;  Location: [facility] OR;  Service: Orthopedics;  Laterality: Left;   SHOULDER SURGERY Right    UMBILICAL HERNIA REPAIR N/A 01/21/2017   Procedure: HERNIA REPAIR UMBILICAL ADULT;  Surgeon: Jordis Laneta FALCON, MD;  Location: [facility] ORS;  Service: General;  Laterality: N/A;   Patient Active Problem List   Diagnosis Date Noted   History of stroke 06/08/2023   Moderate aortic stenosis 06/08/2023   Bone spur    Hemorrhoids    Hyperlipidemia    Hypertension    Sleep apnea    Essential hypertension 06/04/2023   Diabetes mellitus treated with injections of non-insulin  medication (HCC) 06/04/2023   Anxiety and depression 06/04/2023   Dyslipidemia 06/04/2023   H/O migraine with aura 05/29/2023   Stress reaction 05/29/2023   Encounter for medication refill 05/29/2023   Ocular migraine with status migrainosus 03/04/2023   Vitamin D  deficiency 11/02/2022   History of colonic polyps 08/30/2022   Erythrocytosis 05/07/2022   Hand pain, left 07/09/2020   Lateral epicondylitis, left elbow 07/09/2020   Radial nerve compression, left 07/09/2020   Traumatic ecchymosis of left hand 07/09/2020   Rupture of left long head biceps tendon 01/14/2020   Uncontrolled type 2 diabetes mellitus with hyperglycemia (HCC) 04/18/2018   Benign neoplasm of rectosigmoid junction    Internal hemorrhoids    Residual hemorrhoidal skin tags    Diverticulosis of large intestine without diverticulitis    Stomach irritation    GERD (gastroesophageal reflux disease)    History of diverticulitis 12/26/2017   Onychomycosis 12/26/2017   Adhesive capsulitis of left shoulder 12/15/2017   Diverticulitis large intestine 09/2017   Impingement syndrome of left shoulder region 07/05/2017   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   Umbilical hernia without  obstruction and without gangrene    Bilateral inguinal hernia without obstruction or gangrene    History of tobacco abuse 11/26/2016   Migraines    Rotator cuff tendinitis, left 11/12/2015   Labral tear of shoulder, left, initial encounter 11/12/2015   PCP: Melvin Pao, NP  REFERRING PROVIDER: Tobie Franky SQUIBB, DPM  REFERRING DIAG:  Diagnosis  [phone] (ICD-10-CM) - Posterior tibial tendinitis, left  M76.72 (ICD-10-CM) - Peroneal tendinitis, left  M77.51 (ICD-10-CM) - Capsulitis of right ankle   THERAPY DIAG:  Pain in right ankle and joints of right foot  Ankle joint stiffness, right  Muscle weakness (generalized)  Gait difficulty  Pain in left ankle and joints of left foot  Rationale for Evaluation and Treatment: Rehabilitation  ONSET DATE: chronic  SUBJECTIVE:   SUBJECTIVE STATEMENT: Pt. Reports chronic L ankle/foot pain.  Pt. States pain started 3-4 months ago.  No specific MOI and pt. Received a cortisone injection yesterday to L ankle.  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    PERTINENT HISTORY:  Foot Pain      Pt stated that he is still having some pain     53 y.o. male presents with the above complaint.  Patient presents for follow-up of left posterior tibial tendinitis/peroneal tendinitis here  to go over the MRI results still bothers him a little bit.  He has secondary complaint of right ankle pain.  He denies any other acute complaints he would like to discuss treatment options for the ankle pain as well   Orthopedic: Pain on palpation along the course of the posterior tibial tendon pain along the course of the peroneal tendon mild pain on palpation no pain at the Achilles tendon.  No pain at the ATFL ligament   Right ankle pain hurts with ambulation and deep intra-articular ankle pain noted.  No crepitus noted   Patient was evaluated and treated and all questions answered.   Left peroneal tendinitis/posterior tibial tendinitis - All questions  and concerns were discussed with the patient in extensive detail - MRI did not show any pathology.  At this time patient would like to try physical therapy with dry needling dry needling prescription with physical therapy was given to the patient   Right ankle capsulitis - Given the amount of pain that is having he will benefit from steroid injection help decrease of inflammatory components of joint pain.  Patient agrees with plan like to proceed with steroid injection -A steroid injection was performed at right ankle using 1% plain Lidocaine  and 10 mg of Kenalog. This was well tolerated.  PAIN:  Are you having pain? Yes: NPRS scale: 2/10 Pain location: L ankle Pain description: sharp pain/ feels like someone is pushing a knuckle into ankle/heel Aggravating factors: increase activity Relieving factors: Topical gel, rest, using ace bandage to L ankle  0/10 pain at best and 10/10 at worst.    PRECAUTIONS: None  RED FLAGS: None   WEIGHT BEARING RESTRICTIONS: No  FALLS:  Has patient fallen in last 6 months? 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    PLOF: Independent  PATIENT GOALS: Decrease L foot/ankle pain with walking/ work-related tasks.   NEXT MD VISIT: 6 weeks  OBJECTIVE:  Note: Objective measures were completed at Evaluation unless otherwise noted.  DIAGNOSTIC FINDINGS: EXAM DESCRIPTION: MR FOOT LEFT WO CONTRAST   CLINICAL HISTORY: Left peroneal tendinitis/posterior tibial tendinitis   COMPARISON: None Available.   TECHNIQUE: MRI of the foot is performed according to our usual protocol with multiplanar multi sequence imaging.   FINDINGS: No fracture. No erosions. No significant degenerative change. The marrow signal is unremarkable. No joint effusion. Adequate fat in the sinus tarsi.    Moderate subcutaneous edema. There is no significant tendinopathy or tear to the peroneal tendons. The tendons and ligaments are unremarkable. The musculature is unremarkable.   IMPRESSION: Moderate subcutaneous edema. Correlate for lymphedema versus cellulitis.   The exam is otherwise unremarkable.   Electronically signed by: Reyes Frees MD 01/07/2024 10:13 AM EDT RP  PATIENT SURVEYS:  LEFS: 37 out of 80.    COGNITION: Overall cognitive status: Within functional limits for tasks assessed     SENSATION: WFL  EDEMA:  No palpable edema noted.   MUSCLE LENGTH: Hamstrings: NT Thomas test: NT  POSTURE: rounded shoulders  PALPATION: (+) L ankle (medial > lateral) tenderness with generalized. palpation   LOWER EXTREMITY ROM:  Active ROM Right eval Left eval  Hip flexion WNL WNL  Hip extension    Hip abduction WNL WNL  Hip adduction    Hip internal rotation    Hip external rotation  Knee flexion WNL WNL  Knee extension WNL WNL  Ankle dorsiflexion 16 deg. 18 deg.  Ankle plantarflexion 23 deg. 15 deg.  Ankle inversion 30 deg. 10 deg. (Pain)  Ankle eversion 28 deg. 12 deg. (Pain)   (Blank rows = not tested)  LOWER EXTREMITY MMT:  MMT Right eval Left eval  Hip flexion 4 (LBP) 4 (LBP)  Hip extension    Hip abduction 4 4  Hip adduction 4- 4-  Hip internal rotation    Hip external rotation    Knee flexion 4 4-  Knee extension 4 (LBP) 4- (LBP)  Ankle dorsiflexion 4+ 4- (pain)  Ankle plantarflexion 4 3+  Ankle inversion    Ankle eversion     (Blank rows = not tested)  LOWER EXTREMITY SPECIAL TESTS:  Assessment of R ankle mobility (pain limited with hand placement).    FUNCTIONAL TESTS:  5 times sit to stand: TBD  GAIT: Distance walked: in clinic Assistive device utilized: None Level of assistance: Complete Independence Comments: Normalized gait with decrease stride length/ increase ankle pain with increase distance walked. Discussed orthotics/  Fleet Feet to assess feet for proper supportive sneakers.                                                                                            TREATMENT DATE: 02/22/2024  See evaluation/ HEP  PATIENT EDUCATION:  Education details: Fleet Feet/ L ankle isometrics (all planes).   Person educated: Patient Education method: Explanation, Demonstration, and Handouts Education comprehension: verbalized understanding and returned demonstration  HOME EXERCISE PROGRAM: Access Code: [email or web address] URL: [email or web address] Date: 02/22/2024 Prepared by: Ozell Sero  Exercises - Standing Gastroc Stretch  - 1 x daily - 7 x weekly - 3 reps - 30 hold - Long Sitting Isometric Ankle Eversion in Dorsiflexion with Ball at Wall  - 1 x daily - 7 x weekly - 3 sets - 8 reps - 5 hold - Long Sitting Isometric Ankle Inversion in Plantar Flexion with Ball at Wall  - 1 x daily - 7 x weekly - 3 sets - 8 reps - 5 hold - Long Sitting Isometric Ankle Plantarflexion with Ball at Wall  - 1 x daily - 7 x weekly - 3 sets - 8 reps - Towel Scrunches  - 1 x daily - 7 x weekly - 1 sets - 10 reps  ASSESSMENT:  CLINICAL IMPRESSION: Patient is a 53 y.o. male who was seen today for physical therapy evaluation and treatment for chronic L ankle/foot pain.  Pt. Presents with decrease L ankle AROM as compared to R ankle.  Marked B LE muscle weakness noted with increase c/o L ankle pain with resisted DF/IV/EV.  Pt. C/o increase ankle pain with increase distance walked and during work-related tasks.  Pt. Will benefit from skilled PT services to develop ex. Program/ manual tx to improve pain-free mobility with walking/ work-related tasks.    OBJECTIVE IMPAIRMENTS: Abnormal gait, decreased activity tolerance, decreased balance, decreased mobility, difficulty walking, decreased ROM, decreased strength, hypomobility, improper body mechanics, postural dysfunction, and pain.   ACTIVITY LIMITATIONS: lifting, squatting, stairs,  and locomotion level  PARTICIPATION  LIMITATIONS: community activity, occupation, and yard work  PERSONAL FACTORS: Fitness and Past/current experiences are also affecting patient's functional outcome.   REHAB POTENTIAL: Good  CLINICAL DECISION MAKING: Evolving/moderate complexity  EVALUATION COMPLEXITY: Moderate   GOALS: Goals reviewed with patient? Yes  SHORT TERM GOALS: Target date: 03/14/24 Pt. Independent with HEP to increase L ankle AROM to WNL as compared to R ankle to improve pain-free mobility.   Baseline:  see above Goal status: INITIAL  2.  Pt. Will report no palpable pain along L medial/lateral aspects of ankle to improve pain-free mobility.   Baseline:  (+) tenderness noted.  Goal status: INITIAL   LONG TERM GOALS: Target date: 04/04/24  Pt. Will increase LEFS to > 60 out of 80 to improve pain-free mobility with work-related tasks.   Baseline: initial 37 out of 80.   Goal status: INITIAL  2.  Pt. Able to ambulate with normalized gait pattern with proper fitting shoes with on c/o ankle/foot pain to improve pain-free mobility with work-related tasks.  Baseline:  Goal status: INITIAL  3.  Pt. Able to push lawnmower with no c/o ankle/foot pain to improve pain-free mobility.   Baseline: ankle pain with work-related tasks.  Goal status: INITIAL   PLAN:  PT FREQUENCY: 2x/week  PT DURATION: 6 weeks  PLANNED INTERVENTIONS: 97750- Physical Performance Testing, 97110-Therapeutic exercises, 97530- Therapeutic activity, [id number]- Neuromuscular re-education, 97535- Self Care, [id number]- Manual therapy, [phone]- Gait training, [phone]- Electrical stimulation (unattended), [phone]- Electrical stimulation (manual), 20560 (1-2 muscles), 20561 (3+ muscles)- Dry Needling, Patient/Family education, Balance training, Stair training, Taping, Joint mobilization, Cryotherapy, and Moist heat  PLAN FOR NEXT SESSION: Reassess HEP   Ozell JAYSON Sero, PT, DPT # [phone] 02/22/2024, 10:56 AM

## 2024-02-27 ENCOUNTER — Encounter: Payer: Self-pay | Admitting: Physical Therapy

## 2024-02-27 ENCOUNTER — Ambulatory Visit: Admitting: Physical Therapy

## 2024-02-27 DIAGNOSIS — M25671 Stiffness of right ankle, not elsewhere classified: Secondary | ICD-10-CM | POA: Diagnosis not present

## 2024-02-27 DIAGNOSIS — M25572 Pain in left ankle and joints of left foot: Secondary | ICD-10-CM

## 2024-02-27 DIAGNOSIS — M76822 Posterior tibial tendinitis, left leg: Secondary | ICD-10-CM | POA: Diagnosis not present

## 2024-02-27 DIAGNOSIS — M25571 Pain in right ankle and joints of right foot: Secondary | ICD-10-CM | POA: Diagnosis not present

## 2024-02-27 DIAGNOSIS — R269 Unspecified abnormalities of gait and mobility: Secondary | ICD-10-CM | POA: Diagnosis not present

## 2024-02-27 DIAGNOSIS — M6281 Muscle weakness (generalized): Secondary | ICD-10-CM

## 2024-02-27 DIAGNOSIS — M7751 Other enthesopathy of right foot: Secondary | ICD-10-CM | POA: Diagnosis not present

## 2024-02-27 DIAGNOSIS — M7672 Peroneal tendinitis, left leg: Secondary | ICD-10-CM | POA: Diagnosis not present

## 2024-02-27 NOTE — Progress Notes (Signed)
 OUTPATIENT PHYSICAL THERAPY LOWER EXTREMITY TREATMENT   Patient Name: Keith Barber MRN: [id number] DOB:11/25/1970, 53 y.o., male Today's Date: 02/27/2024   END OF SESSION:  PT End of Session - 02/27/24 1008     Visit Number 2    Number of Visits 12    Date for PT Re-Evaluation 04/04/24    PT Start Time 0901    PT Stop Time 0947    PT Time Calculation (min) 46 min    Activity Tolerance Patient limited by pain    Behavior During Therapy [facility] for tasks assessed/performed                 Past Medical History:  Diagnosis Date   Acute CVA (cerebrovascular accident) (HCC) 06/04/2023   Adhesive capsulitis of left shoulder 12/15/2017   Anxiety and depression 06/04/2023   Benign neoplasm of rectosigmoid junction     Bilateral inguinal hernia without obstruction or gangrene     Bone spur      LEFT SHOULDER THAT MAKES HIS LEFT ARM TINGLE - had removed   Diverticulitis large intestine 09/2017   Diverticulosis of large intestine without diverticulitis     Dyslipidemia 06/04/2023   Encounter for medication refill 05/29/2023   Erythrocytosis 05/07/2022   Essential hypertension 06/04/2023   GERD (gastroesophageal reflux disease)     H/O migraine with aura 05/29/2023   Hand pain, left 07/09/2020   Hemorrhoids     History of colonic polyps 08/30/2022   History of diverticulitis 12/26/2017   History of tobacco abuse 11/26/2016   Hyperlipidemia     Hyperlipidemia associated with type 2 diabetes mellitus (HCC)     Hypertension     Hypertension associated with diabetes (HCC)     Impingement syndrome of left shoulder region 07/05/2017   Internal hemorrhoids     Labral tear of shoulder, left, initial encounter 11/12/2015   Lateral epicondylitis, left elbow 07/09/2020   Migraines      rare migraines   Ocular migraine with status migrainosus 03/04/2023   Onychomycosis 12/26/2017   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   Radial nerve compression, left  07/09/2020   Residual hemorrhoidal skin tags     Rotator cuff tendinitis, left 11/12/2015   Rupture of left long head biceps tendon 01/14/2020   Sleep apnea     Stomach irritation     Stress reaction 05/29/2023   Traumatic ecchymosis of left hand 07/09/2020   Type 2 diabetes mellitus without complications (HCC) 06/04/2023   Umbilical hernia without obstruction and without gangrene     Uncontrolled type 2 diabetes mellitus with hyperglycemia (HCC) 04/18/2018   Vitamin D  deficiency 11/02/2022             Past Surgical History:  Procedure Laterality Date   24 HOUR PH STUDY N/A 05/30/2018    Procedure: 24 HOUR PH STUDY;  Surgeon: Janalyn Keene NOVAK, MD;  Location: [facility] ENDOSCOPY;  Service: Gastroenterology;  Laterality: N/A;   bicep tendon tear        COLONOSCOPY WITH PROPOFOL  N/A 02/01/2018    Procedure: COLONOSCOPY WITH PROPOFOL ;  Surgeon: Janalyn Keene NOVAK, MD;  Location: [facility] ENDOSCOPY;  Service: Endoscopy;  Laterality: N/A;   COLONOSCOPY WITH PROPOFOL  N/A 08/30/2022    Procedure: COLONOSCOPY WITH PROPOFOL ;  Surgeon: Therisa Bi, MD;  Location: [facility] ENDOSCOPY;  Service: Gastroenterology;  Laterality: N/A;   ESOPHAGEAL MANOMETRY N/A 05/30/2018    Procedure: ESOPHAGEAL MANOMETRY (EM);  Surgeon: Janalyn Keene NOVAK, MD;  Location: [facility] ENDOSCOPY;  Service:  Gastroenterology;  Laterality: N/A;   ESOPHAGOGASTRODUODENOSCOPY (EGD) WITH PROPOFOL  N/A 02/01/2018    Procedure: ESOPHAGOGASTRODUODENOSCOPY (EGD) WITH PROPOFOL ;  Surgeon: Janalyn Keene NOVAK, MD;  Location: [facility] ENDOSCOPY;  Service: Endoscopy;  Laterality: N/A;   HERNIA REPAIR       INGUINAL HERNIA REPAIR Bilateral 01/21/2017    Procedure: LAPAROSCOPIC BILATERAL INGUINAL HERNIA REPAIR;  Surgeon: Jordis Laneta FALCON, MD;  Location: [facility] ORS;  Service: General;  Laterality: Bilateral;   KNEE SURGERY Left     SHOULDER ARTHROSCOPY WITH DISTAL CLAVICLE RESECTION Left 07/05/2017    Procedure: LEFT SHOULDER ARTHROSCOPY, SUBACROMIAL  DECOMPRESSION, DISTAL CLAVICLE RESECTION, POSSIBLE MINI OPEN ROTATOR CUFF REPAIR;  Surgeon: Anderson Maude ORN, MD;  Location: [facility] OR;  Service: Orthopedics;  Laterality: Left;   SHOULDER CLOSED REDUCTION Left 12/13/2017    Procedure: LEFT SHOULDER MANIPULATION with steroid injection;  Surgeon: Anderson Maude ORN, MD;  Location: [facility] OR;  Service: Orthopedics;  Laterality: Left;   SHOULDER SURGERY Right     UMBILICAL HERNIA REPAIR N/A 01/21/2017    Procedure: HERNIA REPAIR UMBILICAL ADULT;  Surgeon: Jordis Laneta FALCON, MD;  Location: [facility] ORS;  Service: General;  Laterality: N/A;            Patient Active Problem List    Diagnosis Date Noted   History of stroke 06/08/2023   Moderate aortic stenosis 06/08/2023   Bone spur     Hemorrhoids     Hyperlipidemia     Hypertension     Sleep apnea     Essential hypertension 06/04/2023   Diabetes mellitus treated with injections of non-insulin  medication (HCC) 06/04/2023   Anxiety and depression 06/04/2023   Dyslipidemia 06/04/2023   H/O migraine with aura 05/29/2023   Stress reaction 05/29/2023   Encounter for medication refill 05/29/2023   Ocular migraine with status migrainosus 03/04/2023   Vitamin D  deficiency 11/02/2022   History of colonic polyps 08/30/2022   Erythrocytosis 05/07/2022   Hand pain, left 07/09/2020   Lateral epicondylitis, left elbow 07/09/2020   Radial nerve compression, left 07/09/2020   Traumatic ecchymosis of left hand 07/09/2020   Rupture of left long head biceps tendon 01/14/2020   Uncontrolled type 2 diabetes mellitus with hyperglycemia (HCC) 04/18/2018   Benign neoplasm of rectosigmoid junction     Internal hemorrhoids     Residual hemorrhoidal skin tags     Diverticulosis of large intestine without diverticulitis     Stomach irritation     GERD (gastroesophageal reflux disease)     History of diverticulitis 12/26/2017   Onychomycosis 12/26/2017   Adhesive capsulitis of left shoulder 12/15/2017   Diverticulitis  large intestine 09/2017   Impingement syndrome of left shoulder region 07/05/2017   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   Umbilical hernia without obstruction and without gangrene     Bilateral inguinal hernia without obstruction or gangrene     History of tobacco abuse 11/26/2016   Migraines     Rotator cuff tendinitis, left 11/12/2015   Labral tear of shoulder, left, initial encounter 11/12/2015    PCP: Melvin Pao, NP   REFERRING PROVIDER: Tobie Franky SQUIBB, DPM   REFERRING DIAG:  Diagnosis  [phone] (ICD-10-CM) - Posterior tibial tendinitis, left  M76.72 (ICD-10-CM) - Peroneal tendinitis, left  M77.51 (ICD-10-CM) - Capsulitis of right ankle    THERAPY DIAG:  Pain in right ankle and joints of right foot   Ankle joint stiffness, right   Muscle weakness (generalized)   Gait difficulty   Pain in left ankle  and joints of left foot   Rationale for Evaluation and Treatment: Rehabilitation   ONSET DATE: chronic   SUBJECTIVE:    SUBJECTIVE STATEMENT: Pt. Reports chronic L ankle/foot pain.  Pt. States pain started 3-4 months ago.  No specific MOI and pt. Received a cortisone injection yesterday to L ankle.  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     PERTINENT HISTORY:      Foot Pain      Pt stated that he is still having some pain     53 y.o. male presents with the above complaint.  Patient presents for follow-up of left posterior tibial tendinitis/peroneal tendinitis here to go over the MRI results still bothers him a little bit.  He has secondary complaint of right ankle pain.  He denies any other acute complaints he would like to discuss treatment options for the ankle pain as well   Orthopedic: Pain on palpation along the course of the posterior tibial tendon pain along the course of the peroneal tendon mild pain on palpation no pain at the Achilles tendon.  No pain at the ATFL ligament   Right ankle pain hurts with ambulation and  deep intra-articular ankle pain noted.  No crepitus noted    Patient was evaluated and treated and all questions answered.   Left peroneal tendinitis/posterior tibial tendinitis - All questions and concerns were discussed with the patient in extensive detail - MRI did not show any pathology.  At this time patient would like to try physical therapy with dry needling dry needling prescription with physical therapy was given to the patient   Right ankle capsulitis - Given the amount of pain that is having he will benefit from steroid injection help decrease of inflammatory components of joint pain.  Patient agrees with plan like to proceed with steroid injection -A steroid injection was performed at right ankle using 1% plain Lidocaine  and 10 mg of Kenalog. This was well tolerated.   PAIN:  Are you having pain? Yes: NPRS scale: 2/10 Pain location: L ankle Pain description: sharp pain/ feels like someone is pushing a knuckle into ankle/heel Aggravating factors: increase activity Relieving factors: Topical gel, rest, using ace bandage to L ankle   0/10 pain at best and 10/10 at worst.     PRECAUTIONS: None   RED FLAGS: None      WEIGHT BEARING RESTRICTIONS: No   FALLS:  Has patient fallen in last 6 months? 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     PLOF: Independent   PATIENT GOALS: Decrease L foot/ankle pain with walking/ work-related tasks.    NEXT MD VISIT: 6 weeks   OBJECTIVE:  Note: Objective measures were completed at Evaluation unless otherwise noted.   DIAGNOSTIC FINDINGS: EXAM DESCRIPTION: MR FOOT LEFT WO CONTRAST   CLINICAL HISTORY: Left peroneal tendinitis/posterior tibial tendinitis   COMPARISON: None Available.   TECHNIQUE: MRI of the foot is performed according to our usual  protocol with multiplanar multi sequence imaging.   FINDINGS: No fracture. No erosions. No significant degenerative change. The marrow signal is unremarkable. No joint effusion. Adequate fat in the sinus tarsi.   Moderate subcutaneous edema. There is no significant tendinopathy or tear to the peroneal tendons. The tendons and ligaments are unremarkable. The musculature is unremarkable.   IMPRESSION: Moderate subcutaneous edema. Correlate  for lymphedema versus cellulitis.   The exam is otherwise unremarkable.   Electronically signed by: Reyes Frees MD 01/07/2024 10:13 AM EDT RP   PATIENT SURVEYS:  LEFS: 37 out of 80.     COGNITION: Overall cognitive status: Within functional limits for tasks assessed                         SENSATION: WFL   EDEMA:  No palpable edema noted.    MUSCLE LENGTH: Hamstrings: NT Thomas test: NT   POSTURE: rounded shoulders   PALPATION: (+) L ankle (medial > lateral) tenderness with generalized. palpation    LOWER EXTREMITY ROM:   Active ROM Right eval Left eval  Hip flexion WNL WNL  Hip extension      Hip abduction WNL WNL  Hip adduction      Hip internal rotation      Hip external rotation      Knee flexion WNL WNL  Knee extension WNL WNL  Ankle dorsiflexion 16 deg. 18 deg.  Ankle plantarflexion 23 deg. 15 deg.  Ankle inversion 30 deg. 10 deg. (Pain)  Ankle eversion 28 deg. 12 deg. (Pain)   (Blank rows = not tested)   LOWER EXTREMITY MMT:   MMT Right eval Left eval  Hip flexion 4 (LBP) 4 (LBP)  Hip extension      Hip abduction 4 4  Hip adduction 4- 4-  Hip internal rotation      Hip external rotation      Knee flexion 4 4-  Knee extension 4 (LBP) 4- (LBP)  Ankle dorsiflexion 4+ 4- (pain)  Ankle plantarflexion 4 3+  Ankle inversion      Ankle eversion       (Blank rows = not tested)   LOWER EXTREMITY SPECIAL TESTS:  Assessment of R ankle mobility (pain limited with hand placement).     FUNCTIONAL TESTS:  5  times sit to stand: TBD   GAIT: Distance walked: in clinic Assistive device utilized: None Level of assistance: Complete Independence Comments: Normalized gait with decrease stride length/ increase ankle pain with increase distance walked. Discussed orthotics/ [employer] to assess feet for proper supportive sneakers.                                                                                            TREATMENT DATE: 02/27/2024   Subjective:  Pt. reports 4/10 left ankle pain at the start of today's session. Pt. also states that he has been able to complete his HEP without significant difficulty or pain but that he has been experiencing left calf and achilles tendon soreness/pain during and after working outside on lawns. Pt. was not able to make it to [employer] since last session but reports finding an old, unused pair of Sketchers in his closet which he believes gives him more support and therefore less pain while working.   Ther Ex.  Long Sitting Isometric Ankle Inversion/Eversion 2x10 Long Sitting Isometric Ankle Plantarflexion 2x10 Seated Towel Scrunches 2x10 Seated Heel Raises 1x10 Standing Gastroc Stretch 4x20 sec.  Manual Therapy:  Supine position Manual Left  Gastroc Stretching  STM to medial arch region of left ankle  Grade I-II Subtalar Mobilizations all directions  Grade I-II A/P Talocrural Mobilizations   5 time sit to stand time: 23.2 seconds  See evaluation/ HEP   PATIENT EDUCATION:  Education details: [employer]/ L ankle isometrics (all planes).   Person educated: Patient Education method: Explanation, Demonstration, and Handouts Education comprehension: verbalized understanding and returned demonstration   HOME EXERCISE PROGRAM: Access Code: [email or web address] URL: [email or web address] Date: 02/22/2024 Prepared by: Ozell Sero   Exercises - Standing Gastroc Stretch  - 1 x daily - 7 x weekly - 3 reps - 30 hold - Long Sitting Isometric Ankle  Eversion in Dorsiflexion with Ball at Wall  - 1 x daily - 7 x weekly - 3 sets - 8 reps - 5 hold - Long Sitting Isometric Ankle Inversion in Plantar Flexion with Ball at Wall  - 1 x daily - 7 x weekly - 3 sets - 8 reps - 5 hold - Long Sitting Isometric Ankle Plantarflexion with Ball at [employer]  - 1 x daily - 7 x weekly - 3 sets - 8 reps - Towel Scrunches  - 1 x daily - 7 x weekly - 1 sets - 10 reps   ASSESSMENT:   CLINICAL IMPRESSION: Pt. L ankle PF measured to be 38 degrees limited by pain. Pt. presenting with limitations in talocrural and subtalar mobility along with pain and tenderness upon palpation to the left ankle medial arch region along with over the distal achilles tendon. Pt. introduced to new exercise on this day which was seated heel raises which he was able to complete with mild discomfort in the calf region. Observed pt. gait mechanics on this day and demonstrated good ability to perform proper heel strike but antalgic to left side and decreased gait speed noted. Pt. 5x sit to stand time recorded at 23.2 seconds on this day with left lateral knee pain reported. Pt. will continue to benefit from skilled physical therapy to address current deficits and maximize functional outcomes.    OBJECTIVE IMPAIRMENTS: Abnormal gait, decreased activity tolerance, decreased balance, decreased mobility, difficulty walking, decreased ROM, decreased strength, hypomobility, improper body mechanics, postural dysfunction, and pain.    ACTIVITY LIMITATIONS: lifting, squatting, stairs, and locomotion level   PARTICIPATION LIMITATIONS: community activity, occupation, and yard work   PERSONAL FACTORS: Fitness and Past/current experiences are also affecting patient's functional outcome.    REHAB POTENTIAL: Good   CLINICAL DECISION MAKING: Evolving/moderate complexity   EVALUATION COMPLEXITY: Moderate     GOALS: Goals reviewed with patient? Yes   SHORT TERM GOALS: Target date: 03/14/24 Pt. Independent  with HEP to increase L ankle AROM to WNL as compared to R ankle to improve pain-free mobility.   Baseline:  see above Goal status: INITIAL   2.  Pt. Will report no palpable pain along L medial/lateral aspects of ankle to improve pain-free mobility.   Baseline:  (+) tenderness noted.  Goal status: INITIAL     LONG TERM GOALS: Target date: 04/04/24   Pt. Will increase LEFS to > 60 out of 80 to improve pain-free mobility with work-related tasks.   Baseline: initial 37 out of 80.   Goal status: INITIAL   2.  Pt. Able to ambulate with normalized gait pattern with proper fitting shoes with on c/o ankle/foot pain to improve pain-free mobility with work-related tasks.  Baseline:  Goal status: INITIAL   3.  Pt. Able to push lawnmower with no c/o ankle/foot pain  to improve pain-free mobility.   Baseline: ankle pain with work-related tasks.  Goal status: INITIAL     PLAN:   PT FREQUENCY: 2x/week     PT DURATION: 6 weeks   PLANNED INTERVENTIONS: 97750- Physical Performance Testing, 97110-Therapeutic exercises, 97530- Therapeutic activity, [id number]- Neuromuscular re-education, 97535- Self Care, [id number]- Manual therapy, [phone]- Gait training, [phone]- Electrical stimulation (unattended), [phone]- Electrical stimulation (manual), 20560 (1-2 muscles), 20561 (3+ muscles)- Dry Needling, Patient/Family education, Balance training, Stair training, Taping, Joint mobilization, Cryotherapy, and Moist heat   PLAN FOR NEXT SESSION: Update HEP with new exercises and assess pain intensity and irritability during work.    Curtistine Bracket, SPT  Ozell JAYSON Sero, PT, DPT # [phone] 02/27/2024

## 2024-02-29 ENCOUNTER — Encounter: Payer: Self-pay | Admitting: Physical Therapy

## 2024-02-29 ENCOUNTER — Ambulatory Visit: Admitting: Physical Therapy

## 2024-02-29 ENCOUNTER — Other Ambulatory Visit: Payer: Self-pay | Admitting: Nurse Practitioner

## 2024-02-29 DIAGNOSIS — M25572 Pain in left ankle and joints of left foot: Secondary | ICD-10-CM

## 2024-02-29 DIAGNOSIS — M6281 Muscle weakness (generalized): Secondary | ICD-10-CM

## 2024-02-29 DIAGNOSIS — M7672 Peroneal tendinitis, left leg: Secondary | ICD-10-CM | POA: Diagnosis not present

## 2024-02-29 DIAGNOSIS — M25571 Pain in right ankle and joints of right foot: Secondary | ICD-10-CM | POA: Diagnosis not present

## 2024-02-29 DIAGNOSIS — M25671 Stiffness of right ankle, not elsewhere classified: Secondary | ICD-10-CM | POA: Diagnosis not present

## 2024-02-29 DIAGNOSIS — R269 Unspecified abnormalities of gait and mobility: Secondary | ICD-10-CM | POA: Diagnosis not present

## 2024-02-29 DIAGNOSIS — M7751 Other enthesopathy of right foot: Secondary | ICD-10-CM | POA: Diagnosis not present

## 2024-02-29 DIAGNOSIS — M76822 Posterior tibial tendinitis, left leg: Secondary | ICD-10-CM | POA: Diagnosis not present

## 2024-02-29 NOTE — Therapy (Signed)
 OUTPATIENT PHYSICAL THERAPY LOWER EXTREMITY TREATMENT   Patient Name: Keith Barber MRN: [id number] DOB:08-29-1970, 53 y.o., male Today's Date: 02/29/2024    PT End of Session - 02/29/24 1008     Visit Number 3    Number of Visits 12    Date for PT Re-Evaluation 04/04/24    PT Start Time 0903    PT Stop Time 0949    PT Time Calculation (min) 46 min    Activity Tolerance Patient tolerated treatment well    Behavior During Therapy [facility] for tasks assessed/performed                 Past Medical History:  Diagnosis Date   Acute CVA (cerebrovascular accident) (HCC) 06/04/2023   Adhesive capsulitis of left shoulder 12/15/2017   Anxiety and depression 06/04/2023   Benign neoplasm of rectosigmoid junction     Bilateral inguinal hernia without obstruction or gangrene     Bone spur      LEFT SHOULDER THAT MAKES HIS LEFT ARM TINGLE - had removed   Diverticulitis large intestine 09/2017   Diverticulosis of large intestine without diverticulitis     Dyslipidemia 06/04/2023   Encounter for medication refill 05/29/2023   Erythrocytosis 05/07/2022   Essential hypertension 06/04/2023   GERD (gastroesophageal reflux disease)     H/O migraine with aura 05/29/2023   Hand pain, left 07/09/2020   Hemorrhoids     History of colonic polyps 08/30/2022   History of diverticulitis 12/26/2017   History of tobacco abuse 11/26/2016   Hyperlipidemia     Hyperlipidemia associated with type 2 diabetes mellitus (HCC)     Hypertension     Hypertension associated with diabetes (HCC)     Impingement syndrome of left shoulder region 07/05/2017   Internal hemorrhoids     Labral tear of shoulder, left, initial encounter 11/12/2015   Lateral epicondylitis, left elbow 07/09/2020   Migraines      rare migraines   Ocular migraine with status migrainosus 03/04/2023   Onychomycosis 12/26/2017   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   Radial nerve compression, left 07/09/2020    Residual hemorrhoidal skin tags     Rotator cuff tendinitis, left 11/12/2015   Rupture of left long head biceps tendon 01/14/2020   Sleep apnea     Stomach irritation     Stress reaction 05/29/2023   Traumatic ecchymosis of left hand 07/09/2020   Type 2 diabetes mellitus without complications (HCC) 06/04/2023   Umbilical hernia without obstruction and without gangrene     Uncontrolled type 2 diabetes mellitus with hyperglycemia (HCC) 04/18/2018   Vitamin D  deficiency 11/02/2022                  Past Surgical History:  Procedure Laterality Date   24 HOUR PH STUDY N/A 05/30/2018    Procedure: 24 HOUR PH STUDY;  Surgeon: Janalyn Keene NOVAK, MD;  Location: [facility] ENDOSCOPY;  Service: Gastroenterology;  Laterality: N/A;   bicep tendon tear        COLONOSCOPY WITH PROPOFOL  N/A 02/01/2018    Procedure: COLONOSCOPY WITH PROPOFOL ;  Surgeon: Janalyn Keene NOVAK, MD;  Location: [facility] ENDOSCOPY;  Service: Endoscopy;  Laterality: N/A;   COLONOSCOPY WITH PROPOFOL  N/A 08/30/2022    Procedure: COLONOSCOPY WITH PROPOFOL ;  Surgeon: Therisa Bi, MD;  Location: [facility] ENDOSCOPY;  Service: Gastroenterology;  Laterality: N/A;   ESOPHAGEAL MANOMETRY N/A 05/30/2018    Procedure: ESOPHAGEAL MANOMETRY (EM);  Surgeon: Janalyn Keene NOVAK, MD;  Location: [facility] ENDOSCOPY;  Service: Gastroenterology;  Laterality: N/A;   ESOPHAGOGASTRODUODENOSCOPY (EGD) WITH PROPOFOL  N/A 02/01/2018    Procedure: ESOPHAGOGASTRODUODENOSCOPY (EGD) WITH PROPOFOL ;  Surgeon: Janalyn Keene NOVAK, MD;  Location: [facility] ENDOSCOPY;  Service: Endoscopy;  Laterality: N/A;   HERNIA REPAIR       INGUINAL HERNIA REPAIR Bilateral 01/21/2017    Procedure: LAPAROSCOPIC BILATERAL INGUINAL HERNIA REPAIR;  Surgeon: Jordis Laneta FALCON, MD;  Location: [facility] ORS;  Service: General;  Laterality: Bilateral;   KNEE SURGERY Left     SHOULDER ARTHROSCOPY WITH DISTAL CLAVICLE RESECTION Left 07/05/2017    Procedure: LEFT SHOULDER ARTHROSCOPY, SUBACROMIAL DECOMPRESSION,  DISTAL CLAVICLE RESECTION, POSSIBLE MINI OPEN ROTATOR CUFF REPAIR;  Surgeon: Anderson Maude ORN, MD;  Location: [facility] OR;  Service: Orthopedics;  Laterality: Left;   SHOULDER CLOSED REDUCTION Left 12/13/2017    Procedure: LEFT SHOULDER MANIPULATION with steroid injection;  Surgeon: Anderson Maude ORN, MD;  Location: [facility] OR;  Service: Orthopedics;  Laterality: Left;   SHOULDER SURGERY Right     UMBILICAL HERNIA REPAIR N/A 01/21/2017    Procedure: HERNIA REPAIR UMBILICAL ADULT;  Surgeon: Jordis Laneta FALCON, MD;  Location: [facility] ORS;  Service: General;  Laterality: N/A;                Patient Active Problem List    Diagnosis Date Noted   History of stroke 06/08/2023   Moderate aortic stenosis 06/08/2023   Bone spur     Hemorrhoids     Hyperlipidemia     Hypertension     Sleep apnea     Essential hypertension 06/04/2023   Diabetes mellitus treated with injections of non-insulin  medication (HCC) 06/04/2023   Anxiety and depression 06/04/2023   Dyslipidemia 06/04/2023   H/O migraine with aura 05/29/2023   Stress reaction 05/29/2023   Encounter for medication refill 05/29/2023   Ocular migraine with status migrainosus 03/04/2023   Vitamin D  deficiency 11/02/2022   History of colonic polyps 08/30/2022   Erythrocytosis 05/07/2022   Hand pain, left 07/09/2020   Lateral epicondylitis, left elbow 07/09/2020   Radial nerve compression, left 07/09/2020   Traumatic ecchymosis of left hand 07/09/2020   Rupture of left long head biceps tendon 01/14/2020   Uncontrolled type 2 diabetes mellitus with hyperglycemia (HCC) 04/18/2018   Benign neoplasm of rectosigmoid junction     Internal hemorrhoids     Residual hemorrhoidal skin tags     Diverticulosis of large intestine without diverticulitis     Stomach irritation     GERD (gastroesophageal reflux disease)     History of diverticulitis 12/26/2017   Onychomycosis 12/26/2017   Adhesive capsulitis of left shoulder 12/15/2017   Diverticulitis large  intestine 09/2017   Impingement syndrome of left shoulder region 07/05/2017   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   Umbilical hernia without obstruction and without gangrene     Bilateral inguinal hernia without obstruction or gangrene     History of tobacco abuse 11/26/2016   Migraines     Rotator cuff tendinitis, left 11/12/2015   Labral tear of shoulder, left, initial encounter 11/12/2015      PCP: Melvin Pao, NP   REFERRING PROVIDER: Tobie Franky SQUIBB, DPM   REFERRING DIAG:  Diagnosis  [phone] (ICD-10-CM) - Posterior tibial tendinitis, left  M76.72 (ICD-10-CM) - Peroneal tendinitis, left  M77.51 (ICD-10-CM) - Capsulitis of right ankle    THERAPY DIAG:  Pain in right ankle and joints of right foot   Ankle joint stiffness, right   Muscle weakness (generalized)   Gait  difficulty   Pain in left ankle and joints of left foot   Rationale for Evaluation and Treatment: Rehabilitation   ONSET DATE: chronic   SUBJECTIVE:    SUBJECTIVE STATEMENT: Pt. Reports chronic L ankle/foot pain.  Pt. States pain started 3-4 months ago.  No specific MOI and pt. Received a cortisone injection yesterday to L ankle.  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     PERTINENT HISTORY:         Foot Pain      Pt stated that he is still having some pain     53 y.o. male presents with the above complaint.  Patient presents for follow-up of left posterior tibial tendinitis/peroneal tendinitis here to go over the MRI results still bothers him a little bit.  He has secondary complaint of right ankle pain.  He denies any other acute complaints he would like to discuss treatment options for the ankle pain as well   Orthopedic: Pain on palpation along the course of the posterior tibial tendon pain along the course of the peroneal tendon mild pain on palpation no pain at the Achilles tendon.  No pain at the ATFL ligament   Right ankle pain hurts with ambulation and  deep intra-articular ankle pain noted.  No crepitus noted    Patient was evaluated and treated and all questions answered.   Left peroneal tendinitis/posterior tibial tendinitis - All questions and concerns were discussed with the patient in extensive detail - MRI did not show any pathology.  At this time patient would like to try physical therapy with dry needling dry needling prescription with physical therapy was given to the patient   Right ankle capsulitis - Given the amount of pain that is having he will benefit from steroid injection help decrease of inflammatory components of joint pain.  Patient agrees with plan like to proceed with steroid injection -A steroid injection was performed at right ankle using 1% plain Lidocaine  and 10 mg of Kenalog. This was well tolerated.   PAIN:  Are you having pain? Yes: NPRS scale: 2/10 Pain location: L ankle Pain description: sharp pain/ feels like someone is pushing a knuckle into ankle/heel Aggravating factors: increase activity Relieving factors: Topical gel, rest, using ace bandage to L ankle   0/10 pain at best and 10/10 at worst.     PRECAUTIONS: None   RED FLAGS: None      WEIGHT BEARING RESTRICTIONS: No   FALLS:  Has patient fallen in last 6 months? 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     PLOF: Independent   PATIENT GOALS: Decrease L foot/ankle pain with walking/ work-related tasks.    NEXT MD VISIT: 6 weeks   OBJECTIVE:  Note: Objective measures were completed at Evaluation unless otherwise noted.   DIAGNOSTIC FINDINGS: EXAM DESCRIPTION: MR FOOT LEFT WO CONTRAST   CLINICAL HISTORY: Left peroneal tendinitis/posterior tibial tendinitis   COMPARISON: None Available.   TECHNIQUE: MRI of the foot is performed according to our usual  protocol with multiplanar multi sequence imaging.   FINDINGS: No fracture. No erosions. No significant degenerative change. The marrow signal is unremarkable. No joint effusion. Adequate fat in the sinus tarsi.   Moderate subcutaneous edema. There is no significant tendinopathy or tear to the peroneal tendons. The tendons and ligaments are unremarkable. The  musculature is unremarkable.   IMPRESSION: Moderate subcutaneous edema. Correlate for lymphedema versus cellulitis.   The exam is otherwise unremarkable.   Electronically signed by: Reyes Frees MD 01/07/2024 10:13 AM EDT RP   PATIENT SURVEYS:  LEFS: 37 out of 80.     COGNITION: Overall cognitive status: Within functional limits for tasks assessed                         SENSATION: WFL   EDEMA:  No palpable edema noted.    MUSCLE LENGTH: Hamstrings: NT Thomas test: NT   POSTURE: rounded shoulders   PALPATION: (+) L ankle (medial > lateral) tenderness with generalized. palpation    LOWER EXTREMITY ROM:   Active ROM Right eval Left eval  Hip flexion WNL WNL  Hip extension      Hip abduction WNL WNL  Hip adduction      Hip internal rotation      Hip external rotation      Knee flexion WNL WNL  Knee extension WNL WNL  Ankle dorsiflexion 16 deg. 18 deg.  Ankle plantarflexion 23 deg. 15 deg.  Ankle inversion 30 deg. 10 deg. (Pain)  Ankle eversion 28 deg. 12 deg. (Pain)   (Blank rows = not tested)   LOWER EXTREMITY MMT:   MMT Right eval Left eval  Hip flexion 4 (LBP) 4 (LBP)  Hip extension      Hip abduction 4 4  Hip adduction 4- 4-  Hip internal rotation      Hip external rotation      Knee flexion 4 4-  Knee extension 4 (LBP) 4- (LBP)  Ankle dorsiflexion 4+ 4- (pain)  Ankle plantarflexion 4 3+  Ankle inversion      Ankle eversion       (Blank rows = not tested)   LOWER EXTREMITY SPECIAL TESTS:  Assessment of R ankle mobility (pain limited with hand placement).     FUNCTIONAL TESTS:  5  times sit to stand: 23.2 seconds   GAIT: Distance walked: in clinic Assistive device utilized: None Level of assistance: Complete Independence Comments: Normalized gait with decrease stride length/ increase ankle pain with increase distance walked. Discussed orthotics/ Fleet Feet to assess feet for proper supportive sneakers.                                                                                            TREATMENT DATE: 02/29/2024   Subjective:  Pt. reports 3/10 left medial arch and distal achilles tendon pain at the start of today's session which he attributes to not doing much yet today. Pt. States that the lawns he mowed on Monday were not too bad since the yards were small and didn't have many hills. Pt. also reports not sleeping well last night and waking up multiple times which he attributes to the medication he took for getting his tooth pulled. Pt. States that he is feeling a slight improvement in pain levels since initially starting PT.    Ther Ex.  Long Sitting Isometric Ankle Inversion/Eversion 2x10 Long Sitting Isometric Ankle Plantarflexion 2x10 Standing Towel Scrunches  2x12 Seated Heel Raises with ball squeezes, 3x8  Standing Gastroc/Soleus Stretch 4x20 sec. Standing Eccentric Heel Raises, 3x8   Manual Therapy:  Supine position Contract-relax technique into PF Manual Left Gastroc Stretching  IASTM to medial arch region of left ankle  Grade I-II Subtalar Mobilizations all directions  Grade I-II A/P Talocrural Mobilizations    Pt. Educated on anatomy of plantar fascia, warm up phenomenon of involved tendons, importance of continued activity to tolerance, and importance of proper footwear during work activities.    See evaluation/ HEP   PATIENT EDUCATION:  Education details: [employer] Feet/ L ankle isometrics (all planes).   Person educated: Patient Education method: Explanation, Demonstration, and Handouts Education comprehension: verbalized understanding and  returned demonstration   HOME EXERCISE PROGRAM: Access Code: [email or web address] URL: [email or web address] Date: 02/22/2024 Prepared by: Ozell Sero   Exercises - Standing Gastroc Stretch  - 1 x daily - 7 x weekly - 3 reps - 30 hold - Long Sitting Isometric Ankle Eversion in Dorsiflexion with Ball at Wall  - 1 x daily - 7 x weekly - 3 sets - 8 reps - 5 hold - Long Sitting Isometric Ankle Inversion in Plantar Flexion with Ball at Wall  - 1 x daily - 7 x weekly - 3 sets - 8 reps - 5 hold - Long Sitting Isometric Ankle Plantarflexion with Ball at Wall  - 1 x daily - 7 x weekly - 3 sets - 8 reps - Towel Scrunches  - 1 x daily - 7 x weekly - 1 sets - 10 reps  Access Code: [email or web address] URL: [email or web address] Date: 02/29/2024 Prepared by: Ozell Sero Exercises - Standing Gastroc Stretch - 1 x daily - 7 x weekly - 3 reps - 30 hold - Soleus Stretch on Wall - 1 x daily - 7 x weekly - 3 sets - 30 hold - Long Sitting Isometric Ankle Eversion in Dorsiflexion with Ball at Wall - 1 x daily - 7 x weekly - 3 sets - 8 reps - 5 hold - Long Sitting Isometric Ankle Inversion in Plantar Flexion with Ball at Wall - 1 x daily - 7 x weekly - 3 sets - 8 reps - 5 hold - Towel Scrunches - 1 x daily - 7 x weekly - 2 sets - 12 reps - Seated Calf Raise With Small Ball at Heels - 1 x daily - 7 x weekly - 3 sets - 8 reps - Standing Heel Raises - 1 x daily - 7 x weekly - 3 sets - 8 reps    ASSESSMENT:   CLINICAL IMPRESSION: Pt. Presents to PT with minimal pain on this day and focus of tx. On increasing ankle mobility and updating HEP. Pt. Continues to present with moderate left gastroc restriction. Initiated contract-relax technique for left PF in supine which patient tolerated well. Initiated IASTM with [employer] on this day which pt. Responded well too but did report occasional tenderness in lateral region of plantar left foot. Pt. Was progressed to completing towel scrunches in standing and completing  seated heel raises with ball squeeze and for increased reps on this day. Pt. Was also introduced to eccentric standing calf raises which did not increase any pain on this day. Pt. will continue to benefit from skilled physical therapy to address current deficits and maximize functional outcomes.    OBJECTIVE IMPAIRMENTS: Abnormal gait, decreased activity tolerance, decreased balance, decreased mobility, difficulty walking, decreased ROM, decreased strength, hypomobility, improper body mechanics, postural dysfunction, and pain.    ACTIVITY  LIMITATIONS: lifting, squatting, stairs, and locomotion level   PARTICIPATION LIMITATIONS: community activity, occupation, and yard work   PERSONAL FACTORS: Fitness and Past/current experiences are also affecting patient's functional outcome.    REHAB POTENTIAL: Good   CLINICAL DECISION MAKING: Evolving/moderate complexity   EVALUATION COMPLEXITY: Moderate     GOALS: Goals reviewed with patient? Yes   SHORT TERM GOALS: Target date: 03/14/24 Pt. Independent with HEP to increase L ankle AROM to WNL as compared to R ankle to improve pain-free mobility.   Baseline:  see above Goal status: INITIAL   2.  Pt. Will report no palpable pain along L medial/lateral aspects of ankle to improve pain-free mobility.   Baseline:  (+) tenderness noted.  Goal status: INITIAL     LONG TERM GOALS: Target date: 04/04/24   Pt. Will increase LEFS to > 60 out of 80 to improve pain-free mobility with work-related tasks.   Baseline: initial 37 out of 80.   Goal status: INITIAL   2.  Pt. Able to ambulate with normalized gait pattern with proper fitting shoes with on c/o ankle/foot pain to improve pain-free mobility with work-related tasks.  Baseline:  Goal status: INITIAL   3.  Pt. Able to push lawnmower with no c/o ankle/foot pain to improve pain-free mobility.   Baseline: ankle pain with work-related tasks.  Goal status: INITIAL     PLAN:   PT FREQUENCY: 2x/week      PT DURATION: 6 weeks   PLANNED INTERVENTIONS: 97750- Physical Performance Testing, 97110-Therapeutic exercises, 97530- Therapeutic activity, [id number]- Neuromuscular re-education, 97535- Self Care, 02859- Manual therapy, [phone]- Gait training, [phone]- Electrical stimulation (unattended), [phone]- Electrical stimulation (manual), 20560 (1-2 muscles), 20561 (3+ muscles)- Dry Needling, Patient/Family education, Balance training, Stair training, Taping, Joint mobilization, Cryotherapy, and Moist heat   PLAN FOR NEXT SESSION: Consider ankle mobilizations at higher grade now that pain has decreased.      Curtistine Bracket, SPT  Ozell JAYSON Sero, PT, DPT # [phone] 02/29/2024

## 2024-03-02 NOTE — Telephone Encounter (Signed)
 Requested Prescriptions  Pending Prescriptions Disp Refills   valsartan  (DIOVAN ) 40 MG tablet [Pharmacy Med Name: Valsartan  40 MG Oral Tablet] 90 tablet 0    Sig: Take 1 tablet by mouth once daily     Cardiovascular:  Angiotensin Receptor Blockers Passed - 03/02/2024  2:02 PM      Passed - Cr in normal range and within 180 days    Creatinine, Ser  Date Value Ref Range Status  02/14/2024 1.08 0.76 - 1.27 mg/dL Final         Passed - K in normal range and within 180 days    Potassium  Date Value Ref Range Status  02/14/2024 3.9 3.5 - 5.2 mmol/L Final         Passed - Patient is not pregnant      Passed - Last BP in normal range    BP Readings from Last 1 Encounters:  02/14/24 109/67         Passed - Valid encounter within last 6 months    Recent Outpatient Visits           2 weeks ago Uncontrolled type 2 diabetes mellitus with hyperglycemia [facility])   [place] [facility] Melvin Pao, NP   5 months ago Migraine with aura and without status migrainosus, not intractable    [facility] Melvin Pao, NP   5 months ago Migraine with aura and without status migrainosus, not intractable    [facility] Melvin Pao, NP       Future Appointments             In 6 days Revankar, Jennifer SAUNDERS, MD [facility]

## 2024-03-05 ENCOUNTER — Ambulatory Visit: Admitting: Physical Therapy

## 2024-03-05 ENCOUNTER — Encounter: Payer: Self-pay | Admitting: Emergency Medicine

## 2024-03-05 ENCOUNTER — Ambulatory Visit
Admission: EM | Admit: 2024-03-05 | Discharge: 2024-03-05 | Disposition: A | Discharge status: Home / Self Care | Attending: Emergency Medicine | Admitting: Emergency Medicine

## 2024-03-05 ENCOUNTER — Encounter: Payer: Self-pay | Admitting: Oncology

## 2024-03-05 ENCOUNTER — Encounter: Payer: Self-pay | Admitting: Nurse Practitioner

## 2024-03-05 ENCOUNTER — Ambulatory Visit: Payer: Self-pay

## 2024-03-05 DIAGNOSIS — R269 Unspecified abnormalities of gait and mobility: Secondary | ICD-10-CM

## 2024-03-05 DIAGNOSIS — M6281 Muscle weakness (generalized): Secondary | ICD-10-CM

## 2024-03-05 DIAGNOSIS — M76822 Posterior tibial tendinitis, left leg: Secondary | ICD-10-CM | POA: Diagnosis not present

## 2024-03-05 DIAGNOSIS — M7751 Other enthesopathy of right foot: Secondary | ICD-10-CM | POA: Diagnosis not present

## 2024-03-05 DIAGNOSIS — M25671 Stiffness of right ankle, not elsewhere classified: Secondary | ICD-10-CM | POA: Diagnosis not present

## 2024-03-05 DIAGNOSIS — M25572 Pain in left ankle and joints of left foot: Secondary | ICD-10-CM

## 2024-03-05 DIAGNOSIS — T63441A Toxic effect of venom of bees, accidental (unintentional), initial encounter: Secondary | ICD-10-CM | POA: Diagnosis not present

## 2024-03-05 DIAGNOSIS — T7840XA Allergy, unspecified, initial encounter: Secondary | ICD-10-CM

## 2024-03-05 DIAGNOSIS — M7672 Peroneal tendinitis, left leg: Secondary | ICD-10-CM | POA: Diagnosis not present

## 2024-03-05 DIAGNOSIS — M25571 Pain in right ankle and joints of right foot: Secondary | ICD-10-CM | POA: Diagnosis not present

## 2024-03-05 MED ORDER — FAMOTIDINE 20 MG PO TABS
20.0000 mg | ORAL_TABLET | Freq: Once | ORAL | Status: AC
  Administered 2024-03-05: 20 mg via ORAL

## 2024-03-05 MED ORDER — PREDNISONE 10 MG (21) PO TBPK
ORAL_TABLET | ORAL | 0 refills | Status: DC
Start: 2024-03-05 — End: 2024-05-17

## 2024-03-05 MED ORDER — DEXAMETHASONE SODIUM PHOSPHATE 10 MG/ML IJ SOLN
10.0000 mg | Freq: Once | INTRAMUSCULAR | Status: AC
  Administered 2024-03-05: 10 mg via INTRAMUSCULAR

## 2024-03-05 NOTE — ED Triage Notes (Signed)
 Pt states he was stung by about 10 yellow jackets yesterday around 3:15 pm. He was stung on bilateral arms and back. He has been applying calamine lotions, taking benadryl, tylenol  and flexeril . He denies any shortness of breath after the stings.

## 2024-03-05 NOTE — ED Provider Notes (Signed)
 [facility]    CSN: [id number] Arrival date & time: 03/05/24  1123      History   Chief Complaint Chief Complaint  Patient presents with   Insect Bite    HPI Keith Barber is a 53 y.o. male.   HPI  53 year old male with past medical history significant for erythrocytosis, migraine headaches, hypertension, hyperlipidemia, GERD, sleep apnea, uncontrolled type 2 diabetes, acute CVA, and multiple orthopedic issues presents for evaluation of multiple yellowjacket stings that he sustained yesterday at 3:15 in the afternoon.  He reports that he was stung by approximately 10-12 yellowjacket's.  He is endorsing some nausea and headache, along with itching at the areas of envenomation.  He has been taking Tylenol  and using calamine lotion for the itch and headache.  He denies any swelling of the lips or tongue, tightness in throat, or shortness of breath.  Past Medical History:  Diagnosis Date   Acute CVA (cerebrovascular accident) (HCC) 06/04/2023   Adhesive capsulitis of left shoulder 12/15/2017   Anxiety and depression 06/04/2023   Benign neoplasm of rectosigmoid junction    Bilateral inguinal hernia without obstruction or gangrene    Bone spur    LEFT SHOULDER THAT MAKES HIS LEFT ARM TINGLE - had removed   Diverticulitis large intestine 09/2017   Diverticulosis of large intestine without diverticulitis    Dyslipidemia 06/04/2023   Encounter for medication refill 05/29/2023   Erythrocytosis 05/07/2022   Essential hypertension 06/04/2023   GERD (gastroesophageal reflux disease)    H/O migraine with aura 05/29/2023   Hand pain, left 07/09/2020   Hemorrhoids    History of colonic polyps 08/30/2022   History of diverticulitis 12/26/2017   History of tobacco abuse 11/26/2016   Hyperlipidemia    Hyperlipidemia associated with type 2 diabetes mellitus (HCC)    Hypertension    Hypertension associated with diabetes (HCC)    Impingement syndrome of left shoulder  region 07/05/2017   Internal hemorrhoids    Labral tear of shoulder, left, initial encounter 11/12/2015   Lateral epicondylitis, left elbow 07/09/2020   Migraines    rare migraines   Ocular migraine with status migrainosus 03/04/2023   Onychomycosis 12/26/2017   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   Radial nerve compression, left 07/09/2020   Residual hemorrhoidal skin tags    Rotator cuff tendinitis, left 11/12/2015   Rupture of left long head biceps tendon 01/14/2020   Sleep apnea    Stomach irritation    Stress reaction 05/29/2023   Traumatic ecchymosis of left hand 07/09/2020   Type 2 diabetes mellitus without complications (HCC) 06/04/2023   Umbilical hernia without obstruction and without gangrene    Uncontrolled type 2 diabetes mellitus with hyperglycemia (HCC) 04/18/2018   Vitamin D  deficiency 11/02/2022    Patient Active Problem List   Diagnosis Date Noted   History of stroke 06/08/2023   Moderate aortic stenosis 06/08/2023   Bone spur    Hemorrhoids    Hyperlipidemia    Hypertension    Sleep apnea    Essential hypertension 06/04/2023   Diabetes mellitus treated with injections of non-insulin  medication (HCC) 06/04/2023   Anxiety and depression 06/04/2023   Dyslipidemia 06/04/2023   H/O migraine with aura 05/29/2023   Stress reaction 05/29/2023   Encounter for medication refill 05/29/2023   Ocular migraine with status migrainosus 03/04/2023   Vitamin D  deficiency 11/02/2022   History of colonic polyps 08/30/2022   Erythrocytosis 05/07/2022   Hand pain, left 07/09/2020  Lateral epicondylitis, left elbow 07/09/2020   Radial nerve compression, left 07/09/2020   Traumatic ecchymosis of left hand 07/09/2020   Rupture of left long head biceps tendon 01/14/2020   Uncontrolled type 2 diabetes mellitus with hyperglycemia (HCC) 04/18/2018   Benign neoplasm of rectosigmoid junction    Internal hemorrhoids    Residual hemorrhoidal skin tags     Diverticulosis of large intestine without diverticulitis    Stomach irritation    GERD (gastroesophageal reflux disease)    History of diverticulitis 12/26/2017   Onychomycosis 12/26/2017   Adhesive capsulitis of left shoulder 12/15/2017   Diverticulitis large intestine 09/2017   Impingement syndrome of left shoulder region 07/05/2017   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   Umbilical hernia without obstruction and without gangrene    Bilateral inguinal hernia without obstruction or gangrene    History of tobacco abuse 11/26/2016   Migraines    Rotator cuff tendinitis, left 11/12/2015   Labral tear of shoulder, left, initial encounter 11/12/2015    Past Surgical History:  Procedure Laterality Date   71 HOUR PH STUDY N/A 05/30/2018   Procedure: 24 HOUR PH STUDY;  Surgeon: Janalyn Keene NOVAK, MD;  Location: [facility];  Service: Gastroenterology;  Laterality: N/A;   bicep tendon tear      COLONOSCOPY WITH PROPOFOL  N/A 02/01/2018   Procedure: COLONOSCOPY WITH PROPOFOL ;  Surgeon: Janalyn Keene NOVAK, MD;  Location: [facility];  Service: Endoscopy;  Laterality: N/A;   COLONOSCOPY WITH PROPOFOL  N/A 08/30/2022   Procedure: COLONOSCOPY WITH PROPOFOL ;  Surgeon: Therisa Bi, MD;  Location: [facility] ENDOSCOPY;  Service: Gastroenterology;  Laterality: N/A;   ESOPHAGEAL MANOMETRY N/A 05/30/2018   Procedure: ESOPHAGEAL MANOMETRY (EM);  Surgeon: Janalyn Keene NOVAK, MD;  Location: [facility];  Service: Gastroenterology;  Laterality: N/A;   ESOPHAGOGASTRODUODENOSCOPY (EGD) WITH PROPOFOL  N/A 02/01/2018   Procedure: ESOPHAGOGASTRODUODENOSCOPY (EGD) WITH PROPOFOL ;  Surgeon: Janalyn Keene NOVAK, MD;  Location: [facility];  Service: Endoscopy;  Laterality: N/A;   HERNIA REPAIR     INGUINAL HERNIA REPAIR Bilateral 01/21/2017   Procedure: LAPAROSCOPIC BILATERAL INGUINAL HERNIA REPAIR;  Surgeon: Jordis Laneta FALCON, MD;  Location: [facility] ORS;  Service: General;   Laterality: Bilateral;   KNEE SURGERY Left    SHOULDER ARTHROSCOPY WITH DISTAL CLAVICLE RESECTION Left 07/05/2017   Procedure: LEFT SHOULDER ARTHROSCOPY, SUBACROMIAL DECOMPRESSION, DISTAL CLAVICLE RESECTION, POSSIBLE MINI OPEN ROTATOR CUFF REPAIR;  Surgeon: Anderson Maude ORN, MD;  Location: [facility] OR;  Service: Orthopedics;  Laterality: Left;   SHOULDER CLOSED REDUCTION Left 12/13/2017   Procedure: LEFT SHOULDER MANIPULATION with steroid injection;  Surgeon: Anderson Maude ORN, MD;  Location: [facility] OR;  Service: Orthopedics;  Laterality: Left;   SHOULDER SURGERY Right    UMBILICAL HERNIA REPAIR N/A 01/21/2017   Procedure: HERNIA REPAIR UMBILICAL ADULT;  Surgeon: Jordis Laneta FALCON, MD;  Location: [facility] OR[facility];  Service: General;  Laterality: N/A;       Home Medications    Prior to Admission medications   Medication Sig Start Date End Date Taking? Authorizing Provider  predniSONE  (STERAPRED UNI-PAK 21 TAB) 10 MG (21) TBPK tablet Take 6 tablets on day 1, 5 tablets day 2, 4 tablets day 3, 3 tablets day 4, 2 tablets day 5, 1 tablet day 6 03/05/24  Yes Bernardino Ditch, NP  Accu-Chek Softclix Lancets lancets 1 each by Other route in the morning and at bedtime. Use as instructed 11/18/22   Melvin Pao, NP  aspirin  EC 81 MG tablet Take 1 tablet (81 mg total) by  mouth daily. Swallow whole. 06/05/23   Patel, Sona, MD  Blood Glucose Monitoring Suppl (ACCU-CHEK AVIVA PLUS) w/Device KIT 1 each by Does not apply route in the morning and at bedtime. 11/18/22   Melvin Pao, NP  cyclobenzaprine  (FLEXERIL ) 10 MG tablet Take 1 tablet (10 mg total) by mouth 3 (three) times daily as needed for muscle spasms. 02/14/24   Melvin Pao, NP  Dulaglutide  (TRULICITY ) 4.5 MG/0.5ML SOAJ Inject 4.5 mg as directed once a week. 02/15/24   Melvin Pao, NP  glucose blood (ACCU-CHEK AVIVA PLUS) test strip 1 each by Other route in the morning and at bedtime. Use as instructed 11/18/22   Melvin Pao, NP  magnesium  chloride  (SLOW-MAG) 64 MG TBEC SR tablet Take 1 tablet by mouth daily.    [provider]  Omega-3 Fatty Acids (FISH OIL ) 1000 MG CAPS Take 2 capsules (2,000 mg total) by mouth 2 (two) times daily. 06/08/23   Revankar, Jennifer SAUNDERS, MD  Rimegepant Sulfate (NURTEC) 75 MG TBDP Take 75 mg by mouth as needed.    [provider]  rosuvastatin  (CRESTOR ) 20 MG tablet Take 1 tablet by mouth once daily 02/08/24   Melvin Pao, NP  tiZANidine (ZANAFLEX) 4 MG tablet Take 1 tablet every 8 hours by oral route. 08/14/23   [provider]  valsartan  (DIOVAN ) 40 MG tablet Take 1 tablet by mouth once daily 03/02/24   Melvin Pao, NP  Vitamin D , Ergocalciferol , (DRISDOL ) 1.25 MG (50000 UNIT) CAPS capsule Take 1 capsule (50,000 Units total) by mouth every 7 (seven) days. 11/02/22   Vicci Duwaine SQUIBB, DO    Family History Family History  Problem Relation Age of Onset   Diabetes Mother    Migraines Mother    Lung cancer Father    Cancer Father    Lung cancer Paternal Grandfather    COPD Neg Hx    Heart disease Neg Hx    Stroke Neg Hx    Sleep apnea Neg Hx     Social History Social History   Tobacco Use   Smoking status: Former    Current packs/day: 0.00    Average packs/day: 0.5 packs/day for 26.0 years (13.0 ttl pk-yrs)    Types: Cigarettes    Start date: 01/14/1991    Quit date: 01/13/2017    Years since quitting: 7.1   Smokeless tobacco: Never  Vaping Use   Vaping status: Never Used  Substance Use Topics   Alcohol use: No   Drug use: No     Allergies   Amlodipine    Review of Systems Review of Systems  Constitutional:  Negative for fever.  Respiratory:  Negative for shortness of breath, wheezing and stridor.   Gastrointestinal:  Positive for nausea. Negative for vomiting.  Neurological:  Positive for headaches.     Physical Exam Triage Vital Signs ED Triage Vitals  Encounter Vitals Group     BP      Girls Systolic BP Percentile      Girls Diastolic BP  Percentile      Boys Systolic BP Percentile      Boys Diastolic BP Percentile      Pulse      Resp      Temp      Temp src      SpO2      Weight      Height      Head Circumference      Peak Flow      Pain Score  Pain Loc      Pain Education      Exclude from Growth Chart    No data found.  Updated Vital Signs BP (!) 129/90 (BP Location: Left Arm)   Pulse (!) 102   Temp 98.2 F (36.8 C) (Oral)   Resp 18   Wt 190 lb 12.8 oz (86.5 kg)   SpO2 98%   BMI 29.01 kg/m   Visual Acuity Right Eye Distance:   Left Eye Distance:   Bilateral Distance:    Right Eye Near:   Left Eye Near:    Bilateral Near:     Physical Exam Vitals and nursing note reviewed.  Constitutional:      Appearance: Normal appearance. He is not ill-appearing.  HENT:     Head: Normocephalic and atraumatic.  Cardiovascular:     Rate and Rhythm: Normal rate and regular rhythm.     Pulses: Normal pulses.     Heart sounds: Normal heart sounds. No murmur heard.    No friction rub. No gallop.  Pulmonary:     Effort: Pulmonary effort is normal.     Breath sounds: Normal breath sounds. No stridor. No wheezing or rhonchi.  Skin:    General: Skin is warm and dry.     Capillary Refill: Capillary refill takes less than 2 seconds.     Findings: Erythema present.  Neurological:     General: No focal deficit present.     Mental Status: He is alert and oriented to person, place, and time.      UC Treatments / Results  Labs (all labs ordered are listed, but only abnormal results are displayed) Labs Reviewed - No data to display  EKG   Radiology No results found.  Procedures Procedures (including critical care time)  Medications Ordered in UC Medications  dexamethasone  (DECADRON ) injection 10 mg (has no administration in time range)  famotidine  (PEPCID ) tablet 20 mg (has no administration in time range)    Initial Impression / Assessment and Plan / UC Course  I have reviewed the triage  vital signs and the nursing notes.  Pertinent labs & imaging results that were available during my care of the patient were reviewed by me and considered in my medical decision making (see chart for details).   Patient is a pleasant, nontoxic-appearing 54 year old male presenting for evaluation of multiple yellowjacket stings as outlined in the HPI above.  He is not experiencing any respiratory distress.  He is able to speak in full sentence without dyspnea or tachypnea.  No stridor when auscultating over the trachea.  His lungs are clear to auscultation all fields.          As you can see in image above, the patient has multiple areas of envenomation.  He is complaining of a headache and nausea which is most likely a secondary effect of the multiple envenomation.  The incident happened 21 hours ago.  No signs of anaphylaxis.  I will treat him as an allergic reaction with 10 mg of IM Decadron  here in clinic and discharge him home on a 6-day prednisone  taper.  Also dual antihistamine therapy with Claritin, Zyrtec, Allegra during the day, Benadryl at night, and Pepcid  20 mg twice daily.  I will have staff administer the Decadron  and the patient's first dose of Pepcid  prior to discharge.   Final Clinical Impressions(s) / UC Diagnoses   Final diagnoses:  Allergic reaction, initial encounter  Bee sting, accidental or unintentional, initial encounter  Bee sting  reaction, accidental or unintentional, initial encounter     Discharge Instructions      Take over-the-counter Allegra 180 mg daily or Zyrtec or Claritin 10 mg daily to help with your itching.  You can take over-the-counter Benadryl, 50 mg at bedtime, as needed for itching and sleep.  Take the prednisone  pack according to the package instructions.  You will taken on tapering dose over a period of 6 days.  Take it with food and always take it first in the morning with breakfast.  Take over-the-counter Pepcid  20 mg twice daily to  help with itching as well.  If you develop any swelling of your lips or tongue, tightness in your throat, or difficulty breathing you need to go to the ER for evaluation.      ED Prescriptions     Medication Sig Dispense Auth. Provider   predniSONE  (STERAPRED UNI-PAK 21 TAB) 10 MG (21) TBPK tablet Take 6 tablets on day 1, 5 tablets day 2, 4 tablets day 3, 3 tablets day 4, 2 tablets day 5, 1 tablet day 6 21 tablet Bernardino Ditch, NP      PDMP not reviewed this encounter.   Bernardino Ditch, NP 03/05/24 1213

## 2024-03-05 NOTE — Telephone Encounter (Signed)
 Patient advises he has already been seen in urgent care.

## 2024-03-05 NOTE — Telephone Encounter (Signed)
 FYI Only or Action Required?: FYI only for provider.  Patient was last seen in primary care on 02/14/2024 by Melvin Pao, NP.  Called Nurse Triage reporting Insect Bite.  Symptoms began yesterday.  Interventions attempted: OTC medications: benadryl, tylenol  flexeril .  Symptoms are: unchanged.  Triage Disposition: See HCP Within 4 Hours (Or PCP Triage)  Patient/caregiver understands and will follow disposition?: Yes, will follow disposition  Copied from CRM #[id number]. Topic: Clinical - Red Word Triage >> Mar 05, 2024 11:01 AM Debby BROCKS wrote: Red Word that prompted transfer to Nurse Triage: Cutting yard and ran into [employer] 10-12 times Experiencing itching, headache, forehead feels hot, cant sleep Reason for Disposition  [1] Red streak or red line AND [2] length > 2 inches (5 cm)  Answer Assessment - Initial Assessment Questions 1. TYPE: What type of sting was it? (e.g., bee, yellow jacket, unknown)      yellowjacket 2. ONSET: When did it occur?      yesterday 3. LOCATION: Where is the sting located?  How many stings?     10-12 all over 4. SWELLING SIZE: How big is the swelling? (e.g., inches or cm)     Average welt size is dollar coin, quarter,  5. REDNESS: Is the area red or pink? If Yes, ask: What size is area of redness? (e.g., inches or cm). When did the redness start?     Yes, 6. PAIN: Is there any pain? If Yes, ask: How bad is it?  (Scale 0-10; or none, mild, moderate, severe)     severe 7. ITCHING: Is there any itching? If Yes, ask: How bad is it?      Moderate to severe 8. RESPIRATORY DISTRESS: Describe your breathing.     denies 9. PRIOR REACTIONS: Have you had any severe allergic reactions to stings in the past? If Yes, ask: What happened?     denies 10. OTHER SYMPTOMS: Do you have any other symptoms? (e.g., abdomen pain, face or tongue swelling, new rash elsewhere, vomiting)       Welts, nausea, HA,  redness  Pt states that he had a raspy throat this morning, states that it has resolved. No availability today in clinic, pt advised to go to UC, pt agreeable.  Protocols used: Bee or Yellow Jacket Sting-A-AH

## 2024-03-05 NOTE — Progress Notes (Signed)
 OUTPATIENT PHYSICAL THERAPY LOWER EXTREMITY TREATMENT   Patient Name: Keith Barber MRN: [id number] DOB:08-30-1970, 53 y.o., male Today's Date: 03/05/2024   END OF SESSION:  PT End of Session - 03/05/24 0729     Visit Number 4    Number of Visits 12    Date for PT Re-Evaluation 04/04/24    PT Start Time 0729    PT Stop Time 0812    PT Time Calculation (min) 43 min    Activity Tolerance Patient tolerated treatment well    Behavior During Therapy [facility] for tasks assessed/performed                Past Medical History:  Diagnosis Date   Acute CVA (cerebrovascular accident) (HCC) 06/04/2023   Adhesive capsulitis of left shoulder 12/15/2017   Anxiety and depression 06/04/2023   Benign neoplasm of rectosigmoid junction     Bilateral inguinal hernia without obstruction or gangrene     Bone spur      LEFT SHOULDER THAT MAKES HIS LEFT ARM TINGLE - had removed   Diverticulitis large intestine 09/2017   Diverticulosis of large intestine without diverticulitis     Dyslipidemia 06/04/2023   Encounter for medication refill 05/29/2023   Erythrocytosis 05/07/2022   Essential hypertension 06/04/2023   GERD (gastroesophageal reflux disease)     H/O migraine with aura 05/29/2023   Hand pain, left 07/09/2020   Hemorrhoids     History of colonic polyps 08/30/2022   History of diverticulitis 12/26/2017   History of tobacco abuse 11/26/2016   Hyperlipidemia     Hyperlipidemia associated with type 2 diabetes mellitus (HCC)     Hypertension     Hypertension associated with diabetes (HCC)     Impingement syndrome of left shoulder region 07/05/2017   Internal hemorrhoids     Labral tear of shoulder, left, initial encounter 11/12/2015   Lateral epicondylitis, left elbow 07/09/2020   Migraines      rare migraines   Ocular migraine with status migrainosus 03/04/2023   Onychomycosis 12/26/2017   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   Radial nerve compression,  left 07/09/2020   Residual hemorrhoidal skin tags     Rotator cuff tendinitis, left 11/12/2015   Rupture of left long head biceps tendon 01/14/2020   Sleep apnea     Stomach irritation     Stress reaction 05/29/2023   Traumatic ecchymosis of left hand 07/09/2020   Type 2 diabetes mellitus without complications (HCC) 06/04/2023   Umbilical hernia without obstruction and without gangrene     Uncontrolled type 2 diabetes mellitus with hyperglycemia (HCC) 04/18/2018   Vitamin D  deficiency 11/02/2022             Past Surgical History:  Procedure Laterality Date   24 HOUR PH STUDY N/A 05/30/2018    Procedure: 24 HOUR PH STUDY;  Surgeon: Janalyn Keene NOVAK, MD;  Location: [facility] ENDOSCOPY;  Service: Gastroenterology;  Laterality: N/A;   bicep tendon tear        COLONOSCOPY WITH PROPOFOL  N/A 02/01/2018    Procedure: COLONOSCOPY WITH PROPOFOL ;  Surgeon: Janalyn Keene NOVAK, MD;  Location: [facility] ENDOSCOPY;  Service: Endoscopy;  Laterality: N/A;   COLONOSCOPY WITH PROPOFOL  N/A 08/30/2022    Procedure: COLONOSCOPY WITH PROPOFOL ;  Surgeon: Therisa Bi, MD;  Location: [facility]COPY;  Service: Gastroenterology;  Laterality: N/A;   ESOPHAGEAL MANOMETRY N/A 05/30/2018    Procedure: ESOPHAGEAL MANOMETRY (EM);  Surgeon: Janalyn Keene NOVAK, MD;  Location: [facility] ENDOSCOPY;  Service: Gastroenterology;  Laterality: N/A;   ESOPHAGOGASTRODUODENOSCOPY (EGD) WITH PROPOFOL  N/A 02/01/2018    Procedure: ESOPHAGOGASTRODUODENOSCOPY (EGD) WITH PROPOFOL ;  Surgeon: Janalyn Keene NOVAK, MD;  Location: [facility] ENDOSCOPY;  Service: Endoscopy;  Laterality: N/A;   HERNIA REPAIR       INGUINAL HERNIA REPAIR Bilateral 01/21/2017    Procedure: LAPAROSCOPIC BILATERAL INGUINAL HERNIA REPAIR;  Surgeon: Jordis Laneta FALCON, MD;  Location: [facility] ORS;  Service: General;  Laterality: Bilateral;   KNEE SURGERY Left     SHOULDER ARTHROSCOPY WITH DISTAL CLAVICLE RESECTION Left 07/05/2017    Procedure: LEFT SHOULDER ARTHROSCOPY, SUBACROMIAL  DECOMPRESSION, DISTAL CLAVICLE RESECTION, POSSIBLE MINI OPEN ROTATOR CUFF REPAIR;  Surgeon: Anderson Maude ORN, MD;  Location: [facility] OR;  Service: Orthopedics;  Laterality: Left;   SHOULDER CLOSED REDUCTION Left 12/13/2017    Procedure: LEFT SHOULDER MANIPULATION with steroid injection;  Surgeon: Anderson Maude ORN, MD;  Location: [facility] OR;  Service: Orthopedics;  Laterality: Left;   SHOULDER SURGERY Right     UMBILICAL HERNIA REPAIR N/A 01/21/2017    Procedure: HERNIA REPAIR UMBILICAL ADULT;  Surgeon: Jordis Laneta FALCON, MD;  Location: [facility] ORS;  Service: General;  Laterality: N/A;            Patient Active Problem List    Diagnosis Date Noted   History of stroke 06/08/2023   Moderate aortic stenosis 06/08/2023   Bone spur     Hemorrhoids     Hyperlipidemia     Hypertension     Sleep apnea     Essential hypertension 06/04/2023   Diabetes mellitus treated with injections of non-insulin  medication (HCC) 06/04/2023   Anxiety and depression 06/04/2023   Dyslipidemia 06/04/2023   H/O migraine with aura 05/29/2023   Stress reaction 05/29/2023   Encounter for medication refill 05/29/2023   Ocular migraine with status migrainosus 03/04/2023   Vitamin D  deficiency 11/02/2022   History of colonic polyps 08/30/2022   Erythrocytosis 05/07/2022   Hand pain, left 07/09/2020   Lateral epicondylitis, left elbow 07/09/2020   Radial nerve compression, left 07/09/2020   Traumatic ecchymosis of left hand 07/09/2020   Rupture of left long head biceps tendon 01/14/2020   Uncontrolled type 2 diabetes mellitus with hyperglycemia (HCC) 04/18/2018   Benign neoplasm of rectosigmoid junction     Internal hemorrhoids     Residual hemorrhoidal skin tags     Diverticulosis of large intestine without diverticulitis     Stomach irritation     GERD (gastroesophageal reflux disease)     History of diverticulitis 12/26/2017   Onychomycosis 12/26/2017   Adhesive capsulitis of left shoulder 12/15/2017   Diverticulitis  large intestine 09/2017   Impingement syndrome of left shoulder region 07/05/2017   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   Umbilical hernia without obstruction and without gangrene     Bilateral inguinal hernia without obstruction or gangrene     History of tobacco abuse 11/26/2016   Migraines     Rotator cuff tendinitis, left 11/12/2015   Labral tear of shoulder, left, initial encounter 11/12/2015    PCP: Melvin Pao, NP   REFERRING PROVIDER: Tobie Franky SQUIBB, DPM   REFERRING DIAG:  Diagnosis  [phone] (ICD-10-CM) - Posterior tibial tendinitis, left  M76.72 (ICD-10-CM) - Peroneal tendinitis, left  M77.51 (ICD-10-CM) - Capsulitis of right ankle    THERAPY DIAG:  Pain in right ankle and joints of right foot   Ankle joint stiffness, right   Muscle weakness (generalized)   Gait difficulty   Pain in left ankle and joints  of left foot   Rationale for Evaluation and Treatment: Rehabilitation   ONSET DATE: chronic   SUBJECTIVE:    SUBJECTIVE STATEMENT: Pt. Reports chronic L ankle/foot pain.  Pt. States pain started 3-4 months ago.  No specific MOI and pt. Received a cortisone injection yesterday to L ankle.  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     PERTINENT HISTORY:      Foot Pain      Pt stated that he is still having some pain     53 y.o. male presents with the above complaint.  Patient presents for follow-up of left posterior tibial tendinitis/peroneal tendinitis here to go over the MRI results still bothers him a little bit.  He has secondary complaint of right ankle pain.  He denies any other acute complaints he would like to discuss treatment options for the ankle pain as well   Orthopedic: Pain on palpation along the course of the posterior tibial tendon pain along the course of the peroneal tendon mild pain on palpation no pain at the Achilles tendon.  No pain at the ATFL ligament   Right ankle pain hurts with ambulation and  deep intra-articular ankle pain noted.  No crepitus noted    Patient was evaluated and treated and all questions answered.   Left peroneal tendinitis/posterior tibial tendinitis - All questions and concerns were discussed with the patient in extensive detail - MRI did not show any pathology.  At this time patient would like to try physical therapy with dry needling dry needling prescription with physical therapy was given to the patient   Right ankle capsulitis - Given the amount of pain that is having he will benefit from steroid injection help decrease of inflammatory components of joint pain.  Patient agrees with plan like to proceed with steroid injection -A steroid injection was performed at right ankle using 1% plain Lidocaine  and 10 mg of Kenalog. This was well tolerated.   PAIN:  Are you having pain? Yes: NPRS scale: 2/10 Pain location: L ankle Pain description: sharp pain/ feels like someone is pushing a knuckle into ankle/heel Aggravating factors: increase activity Relieving factors: Topical gel, rest, using ace bandage to L ankle   0/10 pain at best and 10/10 at worst.     PRECAUTIONS: None   RED FLAGS: None      WEIGHT BEARING RESTRICTIONS: No   FALLS:  Has patient fallen in last 6 months? 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     PLOF: Independent   PATIENT GOALS: Decrease L foot/ankle pain with walking/ work-related tasks.    NEXT MD VISIT: 6 weeks   OBJECTIVE:  Note: Objective measures were completed at Evaluation unless otherwise noted.   DIAGNOSTIC FINDINGS: EXAM DESCRIPTION: MR FOOT LEFT WO CONTRAST   CLINICAL HISTORY: Left peroneal tendinitis/posterior tibial tendinitis   COMPARISON: None Available.   TECHNIQUE: MRI of the foot is performed according to our usual  protocol with multiplanar multi sequence imaging.   FINDINGS: No fracture. No erosions. No significant degenerative change. The marrow signal is unremarkable. No joint effusion. Adequate fat in the sinus tarsi.   Moderate subcutaneous edema. There is no significant tendinopathy or tear to the peroneal tendons. The tendons and ligaments are unremarkable. The musculature is unremarkable.   IMPRESSION: Moderate subcutaneous edema. Correlate for lymphedema  versus cellulitis.   The exam is otherwise unremarkable.   Electronically signed by: Reyes Frees MD 01/07/2024 10:13 AM EDT RP   PATIENT SURVEYS:  LEFS: 37 out of 80.     COGNITION: Overall cognitive status: Within functional limits for tasks assessed                         SENSATION: WFL   EDEMA:  No palpable edema noted.    MUSCLE LENGTH: Hamstrings: NT Thomas test: NT   POSTURE: rounded shoulders   PALPATION: (+) L ankle (medial > lateral) tenderness with generalized. palpation    LOWER EXTREMITY ROM:   Active ROM Right eval Left eval  Hip flexion WNL WNL  Hip extension      Hip abduction WNL WNL  Hip adduction      Hip internal rotation      Hip external rotation      Knee flexion WNL WNL  Knee extension WNL WNL  Ankle dorsiflexion 16 deg. 18 deg.  Ankle plantarflexion 23 deg. 15 deg.  Ankle inversion 30 deg. 10 deg. (Pain)  Ankle eversion 28 deg. 12 deg. (Pain)   (Blank rows = not tested)   LOWER EXTREMITY MMT:   MMT Right eval Left eval  Hip flexion 4 (LBP) 4 (LBP)  Hip extension      Hip abduction 4 4  Hip adduction 4- 4-  Hip internal rotation      Hip external rotation      Knee flexion 4 4-  Knee extension 4 (LBP) 4- (LBP)  Ankle dorsiflexion 4+ 4- (pain)  Ankle plantarflexion 4 3+  Ankle inversion      Ankle eversion       (Blank rows = not tested)   LOWER EXTREMITY SPECIAL TESTS:  Assessment of R ankle mobility (pain limited with hand placement).     FUNCTIONAL TESTS:  5  times sit to stand: TBD   GAIT: Distance walked: in clinic Assistive device utilized: None Level of assistance: Complete Independence Comments: Normalized gait with decrease stride length/ increase ankle pain with increase distance walked. Discussed orthotics/ [employer] to assess feet for proper supportive sneakers.   5 time sit to stand time: 23.2 seconds                                                                                           TREATMENT DATE: 03/05/2024   Subjective:  Pt. reports ~5/10 left achilles tendon pain at the start of today's session with 1/10 left medial arch pain. Pt. States that his left medial arch region has been feeling better actually during work recently. Pt. also states that he only got ~3 hours of sleep last night after continuously waking up throughout the night feeling itchy after getting stung by yellow jackets over his right UE and right hip region while working on a lawn yesterday.   Ther Ex Long Sitting Isometric Ankle Inversion/Eversion 2x10 Long Sitting Isometric Ankle Plantarflexion 2x10 Seated Heel Raises with ball squeezes, 2x12 Standing Towel Scrunches 2x10 Seated Eccentric Heel Raises off step 2x8 Standing Gastroc/Soleus Stretch 4x20 sec.  Manual Therapy:  Supine position Manual Left Gastroc Stretching  IASTM to medial arch region of left ankle  Contract-relax technique into DF of left ankle Grade I-II Subtalar Mobilizations all directions  Grade I-II A/P Talocrural Mobilizations   Pt. Educated on anatomy of plantar fascia, warm up phenomenon of involved tendons, importance of continued activity to tolerance, and importance of proper footwear during work activities.   See evaluation/ HEP   PATIENT EDUCATION:  Education details: [employer]/ L ankle isometrics (all planes).   Person educated: Patient Education method: Explanation, Demonstration, and Handouts Education comprehension: verbalized understanding and returned  demonstration   HOME EXERCISE PROGRAM: Access Code: [email or web address] URL: [email or web address] Date: 02/22/2024 Prepared by: Ozell Sero   Exercises - Standing Gastroc Stretch  - 1 x daily - 7 x weekly - 3 reps - 30 hold - Long Sitting Isometric Ankle Eversion in Dorsiflexion with Ball at Wall  - 1 x daily - 7 x weekly - 3 sets - 8 reps - 5 hold - Long Sitting Isometric Ankle Inversion in Plantar Flexion with Ball at Wall  - 1 x daily - 7 x weekly - 3 sets - 8 reps - 5 hold - Long Sitting Isometric Ankle Plantarflexion with Ball at [employer]  - 1 x daily - 7 x weekly - 3 sets - 8 reps - Towel Scrunches  - 1 x daily - 7 x weekly - 1 sets - 10 reps   ASSESSMENT:   CLINICAL IMPRESSION: Pt. presents with minimal pain in his left medial arch region and with no significant pain or tenderness during IASTM during the region. Pt. continues to be limited into active and passive left ankle ROM and therefore initiated contract-relax technique into DF which pt. responded well to. Pt. was progressed to completing eccentric heel raises off of stair on this day which he was able to complete without significant difficulty or pain. Pt. reported no change in pain or symptoms anywhere in the left LE at the end of today's session but did report considerable tightness of distal left calf region during tx. session. Pt. will continue to benefit from skilled physical therapy to address current deficits and maximize functional outcomes.    OBJECTIVE IMPAIRMENTS: Abnormal gait, decreased activity tolerance, decreased balance, decreased mobility, difficulty walking, decreased ROM, decreased strength, hypomobility, improper body mechanics, postural dysfunction, and pain.    ACTIVITY LIMITATIONS: lifting, squatting, stairs, and locomotion level   PARTICIPATION LIMITATIONS: community activity, occupation, and yard work   PERSONAL FACTORS: Fitness and Past/current experiences are also affecting patient's functional  outcome.    REHAB POTENTIAL: Good   CLINICAL DECISION MAKING: Evolving/moderate complexity   EVALUATION COMPLEXITY: Moderate     GOALS: Goals reviewed with patient? Yes   SHORT TERM GOALS: Target date: 03/14/24 Pt. Independent with HEP to increase L ankle AROM to WNL as compared to R ankle to improve pain-free mobility.   Baseline:  see above Goal status: INITIAL   2.  Pt. Will report no palpable pain along L medial/lateral aspects of ankle to improve pain-free mobility.   Baseline:  (+) tenderness noted.  Goal status: INITIAL     LONG TERM GOALS: Target date: 04/04/24   Pt. Will increase LEFS to > 60 out of 80 to improve pain-free mobility with work-related tasks.   Baseline: initial 37 out of 80.   Goal status: INITIAL   2.  Pt. Able to ambulate with normalized gait pattern with proper fitting shoes with on c/o ankle/foot pain to improve  pain-free mobility with work-related tasks.  Baseline:  Goal status: INITIAL   3.  Pt. Able to push lawnmower with no c/o ankle/foot pain to improve pain-free mobility.   Baseline: ankle pain with work-related tasks.  Goal status: INITIAL     PLAN:   PT FREQUENCY: 2x/week     PT DURATION: 6 weeks   PLANNED INTERVENTIONS: 97750- Physical Performance Testing, 97110-Therapeutic exercises, 97530- Therapeutic activity, [id number]- Neuromuscular re-education, 97535- Self Care, [id number]- Manual therapy, [id number]- Gait training, [phone]- Electrical stimulation (unattended), [phone]- Electrical stimulation (manual), 20560 (1-2 muscles), 20561 (3+ muscles)- Dry Needling, Patient/Family education, Balance training, Stair training, Taping, Joint mobilization, Cryotherapy, and Moist heat   PLAN FOR NEXT SESSION: Update HEP and focus manual therapy on improving active/passive dorsiflexion.     Curtistine Bracket, SPT  Ozell JAYSON Sero, PT, DPT # [phone] 03/05/2024

## 2024-03-05 NOTE — Discharge Instructions (Addendum)
 Take over-the-counter Allegra 180 mg daily or Zyrtec or Claritin 10 mg daily to help with your itching.  You can take over-the-counter Benadryl, 50 mg at bedtime, as needed for itching and sleep.  Take the prednisone pack according to the package instructions.  You will taken on tapering dose over a period of 6 days.  Take it with food and always take it first in the morning with breakfast.  Take over-the-counter Pepcid 20 mg twice daily to help with itching as well.  If you develop any swelling of your lips or tongue, tightness in your throat, or difficulty breathing you need to go to the ER for evaluation.

## 2024-03-05 NOTE — Telephone Encounter (Signed)
 Per patient no response needed.

## 2024-03-08 ENCOUNTER — Ambulatory Visit: Attending: Cardiology | Admitting: Cardiology

## 2024-03-08 ENCOUNTER — Encounter: Payer: Self-pay | Admitting: Cardiology

## 2024-03-08 VITALS — BP 120/78 | HR 90 | Ht 68.0 in | Wt 193.8 lb

## 2024-03-08 DIAGNOSIS — I350 Nonrheumatic aortic (valve) stenosis: Secondary | ICD-10-CM | POA: Diagnosis not present

## 2024-03-08 DIAGNOSIS — E119 Type 2 diabetes mellitus without complications: Secondary | ICD-10-CM | POA: Insufficient documentation

## 2024-03-08 DIAGNOSIS — E782 Mixed hyperlipidemia: Secondary | ICD-10-CM | POA: Insufficient documentation

## 2024-03-08 DIAGNOSIS — I700 Atherosclerosis of aorta: Secondary | ICD-10-CM | POA: Insufficient documentation

## 2024-03-08 DIAGNOSIS — I10 Essential (primary) hypertension: Secondary | ICD-10-CM | POA: Diagnosis not present

## 2024-03-08 NOTE — Progress Notes (Signed)
 Cardiology Office Note:    Date:  03/08/2024   ID:  Keith Barber, DOB 10-Aug-1970, MRN [id number]  PCP:  Melvin Pao, NP  Cardiologist:  Jennifer JONELLE Crape, MD   Referring MD: Melvin Pao, NP    ASSESSMENT:    1. Aortic atherosclerosis (HCC)   2. Essential hypertension   3. Moderate aortic stenosis   4. Type 2 diabetes mellitus without complication, without long-term current use of insulin  (HCC)   5. Mixed hyperlipidemia    PLAN:    In order of problems listed above:  Aortic atherosclerosis and atherosclerotic vascular disease: Secondary prevention stressed with the patient.  Importance of compliance with diet medication stressed and patient verbalized standing. He was advised to walk at least every other day on a daily basis Moderate aortic stenosis: Symptoms were discussed and questions were answered to satisfaction.  Currently is asymptomatic.  We will do echo before his next visit to reassess aortic stenosis. Essential hypertension: Blood pressure stable and diet was emphasized. Diabetes mellitus and mixed dyslipidemia: Diabetes is not optimally controlled and I told him about diet and exercise.  Lipids are at goal.  Goal LDL must be less than 60 and his discussions were held with him at length. Patient will be seen in follow-up appointment in 6 months or earlier if the patient has any concerns.    Medication Adjustments/Labs and Tests Ordered: Current medicines are reviewed at length with the patient today.  Concerns regarding medicines are outlined above.  No orders of the defined types were placed in this encounter.  No orders of the defined types were placed in this encounter.    No chief complaint on file.    History of Present Illness:    Keith Barber is a 53 y.o. male.  Patient has past medical history of moderate aortic stenosis, diabetes mellitus and mixed dyslipidemia.  He denies any problems at this time and takes care of activities  of daily living.  No chest pain orthopnea or PND.  He does yard work and such activities without any symptoms.  At the time of my evaluation, the patient is alert awake oriented and in no distress.  Past Medical History:  Diagnosis Date   Adhesive capsulitis of left shoulder 12/15/2017   Anxiety and depression 06/04/2023   Benign neoplasm of rectosigmoid junction    Bilateral inguinal hernia without obstruction or gangrene    Bone spur    LEFT SHOULDER THAT MAKES HIS LEFT ARM TINGLE - had removed   Diabetes mellitus treated with injections of non-insulin  medication (HCC) 06/04/2023   Diverticulitis large intestine 09/2017   Diverticulosis of large intestine without diverticulitis    Dyslipidemia 06/04/2023   Encounter for medication refill 05/29/2023   Erythrocytosis 05/07/2022   Essential hypertension 06/04/2023   GERD (gastroesophageal reflux disease)    H/O migraine with aura 05/29/2023   Hand pain, left 07/09/2020   Hemorrhoids    History of colonic polyps 08/30/2022   History of diverticulitis 12/26/2017   History of stroke 06/08/2023   History of tobacco abuse 11/26/2016   Hyperlipidemia    Hypertension    Impingement syndrome of left shoulder region 07/05/2017   Internal hemorrhoids    Labral tear of shoulder, left, initial encounter 11/12/2015   Lateral epicondylitis, left elbow 07/09/2020   Migraines    rare migraines   Moderate aortic stenosis 06/08/2023   Ocular migraine with status migrainosus 03/04/2023   Onychomycosis 12/26/2017   Osteoarthritis of left AC (acromioclavicular) joint  07/05/2017   Radial nerve compression, left 07/09/2020   Residual hemorrhoidal skin tags    Rotator cuff tendinitis, left 11/12/2015   Rupture of left long head biceps tendon 01/14/2020   Sleep apnea    Stomach irritation    Stress reaction 05/29/2023   Traumatic ecchymosis of left hand 07/09/2020   Type 2 diabetes mellitus without complications (HCC) 06/04/2023   Umbilical  hernia without obstruction and without gangrene    Uncontrolled type 2 diabetes mellitus with hyperglycemia (HCC) 04/18/2018   Vitamin D  deficiency 11/02/2022    Past Surgical History:  Procedure Laterality Date   64 HOUR PH STUDY N/A 05/30/2018   Procedure: 24 HOUR PH STUDY;  Surgeon: Janalyn Keene NOVAK, MD;  Location: [facility] ENDOSCOPY;  Service: Gastroenterology;  Laterality: N/A;   bicep tendon tear      COLONOSCOPY WITH PROPOFOL  N/A 02/01/2018   Procedure: COLONOSCOPY WITH PROPOFOL ;  Surgeon: Janalyn Keene NOVAK, MD;  Location: [facility] ENDOSCOPY;  Service: Endoscopy;  Laterality: N/A;   COLONOSCOPY WITH PROPOFOL  N/A 08/30/2022   Procedure: COLONOSCOPY WITH PROPOFOL ;  Surgeon: Therisa Bi, MD;  Location: [facility] ENDOSCOPY;  Service: Gastroenterology;  Laterality: N/A;   ESOPHAGEAL MANOMETRY N/A 05/30/2018   Procedure: ESOPHAGEAL MANOMETRY (EM);  Surgeon: Janalyn Keene NOVAK, MD;  Location: [facility] ENDOSCOPY;  Service: Gastroenterology;  Laterality: N/A;   ESOPHAGOGASTRODUODENOSCOPY (EGD) WITH PROPOFOL  N/A 02/01/2018   Procedure: ESOPHAGOGASTRODUODENOSCOPY (EGD) WITH PROPOFOL ;  Surgeon: Janalyn Keene NOVAK, MD;  Location: [facility] ENDOSCOPY;  Service: Endoscopy;  Laterality: N/A;   HERNIA REPAIR     INGUINAL HERNIA REPAIR Bilateral 01/21/2017   Procedure: LAPAROSCOPIC BILATERAL INGUINAL HERNIA REPAIR;  Surgeon: Jordis Laneta FALCON, MD;  Location: [facility] ORS;  Service: General;  Laterality: Bilateral;   KNEE SURGERY Left    SHOULDER ARTHROSCOPY WITH DISTAL CLAVICLE RESECTION Left 07/05/2017   Procedure: LEFT SHOULDER ARTHROSCOPY, SUBACROMIAL DECOMPRESSION, DISTAL CLAVICLE RESECTION, POSSIBLE MINI OPEN ROTATOR CUFF REPAIR;  Surgeon: Anderson Maude ORN, MD;  Location: [facility] OR;  Service: Orthopedics;  Laterality: Left;   SHOULDER CLOSED REDUCTION Left 12/13/2017   Procedure: LEFT SHOULDER MANIPULATION with steroid injection;  Surgeon: Anderson Maude ORN, MD;  Location: [facility] OR;  Service: Orthopedics;  Laterality:  Left;   SHOULDER SURGERY Right    UMBILICAL HERNIA REPAIR N/A 01/21/2017   Procedure: HERNIA REPAIR UMBILICAL ADULT;  Surgeon: Jordis Laneta FALCON, MD;  Location: [facility] ORS;  Service: General;  Laterality: N/A;    Current Medications: Current Meds  Medication Sig   Accu-Chek Softclix Lancets lancets 1 each by Other route in the morning and at bedtime. Use as instructed   aspirin  EC 81 MG tablet Take 1 tablet (81 mg total) by mouth daily. Swallow whole.   Blood Glucose Monitoring Suppl (ACCU-CHEK AVIVA PLUS) w/Device KIT 1 each by Does not apply route in the morning and at bedtime.   cyclobenzaprine  (FLEXERIL ) 10 MG tablet Take 1 tablet (10 mg total) by mouth 3 (three) times daily as needed for muscle spasms.   Dulaglutide  (TRULICITY ) 4.5 MG/0.5ML SOAJ Inject 4.5 mg as directed once a week.   glucose blood (ACCU-CHEK AVIVA PLUS) test strip 1 each by Other route in the morning and at bedtime. Use as instructed   magnesium  chloride (SLOW-MAG) 64 MG TBEC SR tablet Take 1 tablet by mouth daily.   Omega-3 Fatty Acids (FISH OIL ) 1000 MG CAPS Take 2 capsules (2,000 mg total) by mouth 2 (two) times daily.   predniSONE  (STERAPRED UNI-PAK 21 TAB) 10 MG (21) TBPK tablet Take 6 tablets  on day 1, 5 tablets day 2, 4 tablets day 3, 3 tablets day 4, 2 tablets day 5, 1 tablet day 6   Rimegepant Sulfate (NURTEC) 75 MG TBDP Take 75 mg by mouth as needed.   rosuvastatin  (CRESTOR ) 20 MG tablet Take 1 tablet by mouth once daily   tiZANidine (ZANAFLEX) 4 MG tablet Take 1 tablet every 8 hours by oral route.   valsartan  (DIOVAN ) 40 MG tablet Take 1 tablet by mouth once daily   Vitamin D , Ergocalciferol , (DRISDOL ) 1.25 MG (50000 UNIT) CAPS capsule Take 1 capsule (50,000 Units total) by mouth every 7 (seven) days.     Allergies:   Amlodipine    Social History   Socioeconomic History   Marital status: Divorced    Spouse name: Not on file   Number of children: Not on file   Years of education: Not on file   Highest  education level: 12th grade  Occupational History   Not on file  Tobacco Use   Smoking status: Former    Current packs/day: 0.00    Average packs/day: 0.5 packs/day for 26.0 years (13.0 ttl pk-yrs)    Types: Cigarettes    Start date: 01/14/1991    Quit date: 01/13/2017    Years since quitting: 7.1   Smokeless tobacco: Never  Vaping Use   Vaping status: Never Used  Substance and Sexual Activity   Alcohol use: No   Drug use: No   Sexual activity: Yes  Other Topics Concern   Not on file  Social History Narrative   Not on file   Social Drivers of Health   Financial Resource Strain: Low Risk  (02/10/2024)   Overall Financial Resource Strain (CARDIA)    Difficulty of Paying Living Expenses: Not very hard  Food Insecurity: No Food Insecurity (02/10/2024)   Hunger Vital Sign    Worried About Running Out of Food in the Last Year: Never true    Ran Out of Food in the Last Year: Never true  Transportation Needs: No Transportation Needs (02/10/2024)   PRAPARE - Administrator, Civil Service (Medical): No    Lack of Transportation (Non-Medical): No  Physical Activity: Sufficiently Active (02/10/2024)   Exercise Vital Sign    Days of Exercise per Week: 3 days    Minutes of Exercise per Session: 150+ min  Stress: Stress Concern Present (02/10/2024)   [employer] of Occupational Health - Occupational Stress Questionnaire    Feeling of Stress: To some extent  Social Connections: Moderately Integrated (02/10/2024)   Social Connection and Isolation Panel    Frequency of Communication with Friends and Family: More than three times a week    Frequency of Social Gatherings with Friends and Family: More than three times a week    Attends Religious Services: 1 to 4 times per year    Active Member of [employer] or Organizations: No    Attends Engineer, structural: Not on file    Marital Status: Married     Family History: The patient's family history includes Cancer in his  father; Diabetes in his mother; Lung cancer in his father and paternal grandfather; Migraines in his mother. There is no history of COPD, Heart disease, Stroke, or Sleep apnea.  ROS:   Please see the history of present illness.    All other systems reviewed and are negative.  EKGs/Labs/Other Studies Reviewed:    The following studies were reviewed today: ..   ..   I discussed my  findings with the patient at length   Recent Labs: 06/04/2023: TSH 1.184 09/19/2023: Hemoglobin 18.0; Platelet Count 230 02/14/2024: ALT 30; BUN 13; Creatinine, Ser 1.08; Potassium 3.9; Sodium 137  Recent Lipid Panel    Component Value Date/Time   CHOL 127 02/14/2024 0845   TRIG 181 (H) 02/14/2024 0845   HDL 39 (L) 02/14/2024 0845   CHOLHDL 3.3 02/14/2024 0845   CHOLHDL 4.3 06/04/2023 0313   VLDL 51 (H) 06/04/2023 0313   LDLCALC 58 02/14/2024 0845    Physical Exam:    VS:  BP 120/78   Pulse 90   Ht 5' 8 (1.727 m)   Wt 193 lb 12.8 oz (87.9 kg)   SpO2 95%   BMI 29.47 kg/m     Wt Readings from Last 3 Encounters:  03/08/24 193 lb 12.8 oz (87.9 kg)  03/05/24 190 lb 12.8 oz (86.5 kg)  02/14/24 190 lb 12.8 oz (86.5 kg)     GEN: Patient is in no acute distress HEENT: Normal NECK: No JVD; No carotid bruits LYMPHATICS: No lymphadenopathy CARDIAC: Hear sounds regular, 2/6 systolic murmur at the apex and aortic area. RESPIRATORY:  Clear to auscultation without rales, wheezing or rhonchi  ABDOMEN: Soft, non-tender, non-distended MUSCULOSKELETAL:  No edema; No deformity  SKIN: Warm and dry NEUROLOGIC:  Alert and oriented x 3 PSYCHIATRIC:  Normal affect   Signed, Jennifer JONELLE Crape, MD  03/08/2024 9:58 AM    [facility]

## 2024-03-08 NOTE — Patient Instructions (Addendum)
 Medication Instructions:  Your physician recommends that you continue on your current medications as directed. Please refer to the Current Medication list given to you today.  *If you need a refill on your cardiac medications before your next appointment, please call your pharmacy*   Lab Work: None ordered If you have labs (blood work) drawn today and your tests are completely normal, you will receive your results only by: MyChart Message (if you have MyChart) OR A paper copy in the mail If you have any lab test that is abnormal or we need to change your treatment, we will call you to review the results.  Testing/Procedures: Your physician has requested that you have an echocardiogram in 9 months. Echocardiography is a painless test that uses sound waves to create images of your heart. It provides your doctor with information about the size and shape of your heart and how well your heart's chambers and valves are working. This procedure takes approximately one hour. There are no restrictions for this procedure. Please do NOT wear cologne, perfume, aftershave, or lotions (deodorant is allowed). Please arrive 15 minutes prior to your appointment time.  Please note: We ask at that you not bring children with you during ultrasound (echo/ vascular) testing. Due to room size and safety concerns, children are not allowed in the ultrasound rooms during exams. Our front office staff cannot provide observation of children in our lobby area while testing is being conducted. An adult accompanying a patient to their appointment will only be allowed in the ultrasound room at the discretion of the ultrasound technician under special circumstances. We apologize for any inconvenience.  Follow-Up: At [facility], you and your health needs are our priority.  As part of our continuing mission to provide you with exceptional heart care, we have created designated Provider Care Teams.  These Care Teams include  your primary Cardiologist (physician) and Advanced Practice Providers (APPs -  Physician Assistants and Nurse Practitioners) who all work together to provide you with the care you need, when you need it.  We recommend signing up for the patient portal called MyChart.  Sign up information is provided on this After Visit Summary.  MyChart is used to connect with patients for Virtual Visits (Telemedicine).  Patients are able to view lab/test results, encounter notes, upcoming appointments, etc.  Non-urgent messages can be sent to your provider as well.   To learn more about what you can do with MyChart, go to [email or web address].    Your next appointment:   9 month(s)  The format for your next appointment:   In Person  Provider:   Jennifer Crape, MD   Other Instructions Echocardiogram An echocardiogram is a test that uses sound waves (ultrasound) to produce images of the heart. Images from an echocardiogram can provide important information about: Heart size and shape. The size and thickness and movement of your heart's walls. Heart muscle function and strength. Heart valve function or if you have stenosis. Stenosis is when the heart valves are too narrow. If blood is flowing backward through the heart valves (regurgitation). A tumor or infectious growth around the heart valves. Areas of heart muscle that are not working well because of poor blood flow or injury from a heart attack. Aneurysm detection. An aneurysm is a weak or damaged part of an artery wall. The wall bulges out from the normal force of blood pumping through the body. Tell a health care provider about: Any allergies you have. All medicines you are taking,  including vitamins, herbs, eye drops, creams, and over-the-counter medicines. Any blood disorders you have. Any surgeries you have had. Any medical conditions you have. Whether you are pregnant or may be pregnant. What are the risks? Generally, this is a safe  test. However, problems may occur, including an allergic reaction to dye (contrast) that may be used during the test. What happens before the test? No specific preparation is needed. You may eat and drink normally. What happens during the test? You will take off your clothes from the waist up and put on a hospital gown. Electrodes or electrocardiogram (ECG)patches may be placed on your chest. The electrodes or patches are then connected to a device that monitors your heart rate and rhythm. You will lie down on a table for an ultrasound exam. A gel will be applied to your chest to help sound waves pass through your skin. A handheld device, called a transducer, will be pressed against your chest and moved over your heart. The transducer produces sound waves that travel to your heart and bounce back (or echo back) to the transducer. These sound waves will be captured in real-time and changed into images of your heart that can be viewed on a video monitor. The images will be recorded on a computer and reviewed by your health care provider. You may be asked to change positions or hold your breath for a short time. This makes it easier to get different views or better views of your heart. In some cases, you may receive contrast through an IV in one of your veins. This can improve the quality of the pictures from your heart. The procedure may vary among health care providers and hospitals.   What can I expect after the test? You may return to your normal, everyday life, including diet, activities, and medicines, unless your health care provider tells you not to do that. Follow these instructions at home: It is up to you to get the results of your test. Ask your health care provider, or the department that is doing the test, when your results will be ready. Keep all follow-up visits. This is important. Summary An echocardiogram is a test that uses sound waves (ultrasound) to produce images of the  heart. Images from an echocardiogram can provide important information about the size and shape of your heart, heart muscle function, heart valve function, and other possible heart problems. You do not need to do anything to prepare before this test. You may eat and drink normally. After the echocardiogram is completed, you may return to your normal, everyday life, unless your health care provider tells you not to do that. This information is not intended to replace advice given to you by your health care provider. Make sure you discuss any questions you have with your health care provider. Document Revised: 02/26/2020 Document Reviewed: 02/26/2020 Elsevier Patient Education  2021 Elsevier Inc.   Important Information About Sugar

## 2024-03-13 ENCOUNTER — Ambulatory Visit: Admitting: Physical Therapy

## 2024-03-13 ENCOUNTER — Encounter: Payer: Self-pay | Admitting: Physical Therapy

## 2024-03-13 DIAGNOSIS — M6281 Muscle weakness (generalized): Secondary | ICD-10-CM

## 2024-03-13 DIAGNOSIS — M7751 Other enthesopathy of right foot: Secondary | ICD-10-CM | POA: Diagnosis not present

## 2024-03-13 DIAGNOSIS — R269 Unspecified abnormalities of gait and mobility: Secondary | ICD-10-CM

## 2024-03-13 DIAGNOSIS — M25571 Pain in right ankle and joints of right foot: Secondary | ICD-10-CM | POA: Diagnosis not present

## 2024-03-13 DIAGNOSIS — M25572 Pain in left ankle and joints of left foot: Secondary | ICD-10-CM

## 2024-03-13 DIAGNOSIS — M76822 Posterior tibial tendinitis, left leg: Secondary | ICD-10-CM | POA: Diagnosis not present

## 2024-03-13 DIAGNOSIS — M25671 Stiffness of right ankle, not elsewhere classified: Secondary | ICD-10-CM | POA: Diagnosis not present

## 2024-03-13 DIAGNOSIS — M7672 Peroneal tendinitis, left leg: Secondary | ICD-10-CM | POA: Diagnosis not present

## 2024-03-13 NOTE — Therapy (Signed)
 OUTPATIENT PHYSICAL THERAPY LOWER EXTREMITY TREATMENT   Patient Name: Keith Barber MRN: [id number] DOB:[birth year], 53 y.o., male Today's Date: 03/13/2024    PT End of Session - 03/13/24 0822     Visit Number 5    Number of Visits 12    Date for PT Re-Evaluation 04/04/24    PT Start Time 0823    PT Stop Time 0906    PT Time Calculation (min) 43 min    Activity Tolerance Patient tolerated treatment well    Behavior During Therapy [facility] for tasks assessed/performed                 Past Medical History:  Diagnosis Date   Acute CVA (cerebrovascular accident) (HCC) 06/04/2023   Adhesive capsulitis of left shoulder 12/15/2017   Anxiety and depression 06/04/2023   Benign neoplasm of rectosigmoid junction     Bilateral inguinal hernia without obstruction or gangrene     Bone spur      LEFT SHOULDER THAT MAKES HIS LEFT ARM TINGLE - had removed   Diverticulitis large intestine 09/2017   Diverticulosis of large intestine without diverticulitis     Dyslipidemia 06/04/2023   Encounter for medication refill 05/29/2023   Erythrocytosis 05/07/2022   Essential hypertension 06/04/2023   GERD (gastroesophageal reflux disease)     H/O migraine with aura 05/29/2023   Hand pain, left 07/09/2020   Hemorrhoids     History of colonic polyps 08/30/2022   History of diverticulitis 12/26/2017   History of tobacco abuse 11/26/2016   Hyperlipidemia     Hyperlipidemia associated with type 2 diabetes mellitus (HCC)     Hypertension     Hypertension associated with diabetes (HCC)     Impingement syndrome of left shoulder region 07/05/2017   Internal hemorrhoids     Labral tear of shoulder, left, initial encounter 11/12/2015   Lateral epicondylitis, left elbow 07/09/2020   Migraines      rare migraines   Ocular migraine with status migrainosus 03/04/2023   Onychomycosis 12/26/2017   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   Radial nerve compression, left 07/09/2020    Residual hemorrhoidal skin tags     Rotator cuff tendinitis, left 11/12/2015   Rupture of left long head biceps tendon 01/14/2020   Sleep apnea     Stomach irritation     Stress reaction 05/29/2023   Traumatic ecchymosis of left hand 07/09/2020   Type 2 diabetes mellitus without complications (HCC) 06/04/2023   Umbilical hernia without obstruction and without gangrene     Uncontrolled type 2 diabetes mellitus with hyperglycemia (HCC) 04/18/2018   Vitamin D  deficiency 11/02/2022                  Past Surgical History:  Procedure Laterality Date   24 HOUR PH STUDY N/A 05/30/2018    Procedure: 24 HOUR PH STUDY;  Surgeon: Janalyn Keene NOVAK, MD;  Location: [facility] ENDOSCOPY;  Service: Gastroenterology;  Laterality: N/A;   bicep tendon tear        COLONOSCOPY WITH PROPOFOL  N/A 02/01/2018    Procedure: COLONOSCOPY WITH PROPOFOL ;  Surgeon: Janalyn Keene NOVAK, MD;  Location: [facility] ENDOSCOPY;  Service: Endoscopy;  Laterality: N/A;   COLONOSCOPY WITH PROPOFOL  N/A 08/30/2022    Procedure: COLONOSCOPY WITH PROPOFOL ;  Surgeon: Therisa Bi, MD;  Location: [facility]COPY;  Service: Gastroenterology;  Laterality: N/A;   ESOPHAGEAL MANOMETRY N/A 05/30/2018    Procedure: ESOPHAGEAL MANOMETRY (EM);  Surgeon: Janalyn Keene NOVAK, MD;  Location: [facility] ENDOSCOPY;  Service: Gastroenterology;  Laterality: N/A;   ESOPHAGOGASTRODUODENOSCOPY (EGD) WITH PROPOFOL  N/A 02/01/2018    Procedure: ESOPHAGOGASTRODUODENOSCOPY (EGD) WITH PROPOFOL ;  Surgeon: Janalyn Keene NOVAK, MD;  Location: [facility] ENDOSCOPY;  Service: Endoscopy;  Laterality: N/A;   HERNIA REPAIR       INGUINAL HERNIA REPAIR Bilateral 01/21/2017    Procedure: LAPAROSCOPIC BILATERAL INGUINAL HERNIA REPAIR;  Surgeon: Jordis Laneta FALCON, MD;  Location: [facility] ORS;  Service: General;  Laterality: Bilateral;   KNEE SURGERY Left     SHOULDER ARTHROSCOPY WITH DISTAL CLAVICLE RESECTION Left 07/05/2017    Procedure: LEFT SHOULDER ARTHROSCOPY, SUBACROMIAL DECOMPRESSION,  DISTAL CLAVICLE RESECTION, POSSIBLE MINI OPEN ROTATOR CUFF REPAIR;  Surgeon: Anderson Maude ORN, MD;  Location: [facility] OR;  Service: Orthopedics;  Laterality: Left;   SHOULDER CLOSED REDUCTION Left 12/13/2017    Procedure: LEFT SHOULDER MANIPULATION with steroid injection;  Surgeon: Anderson Maude ORN, MD;  Location: [facility] OR;  Service: Orthopedics;  Laterality: Left;   SHOULDER SURGERY Right     UMBILICAL HERNIA REPAIR N/A 01/21/2017    Procedure: HERNIA REPAIR UMBILICAL ADULT;  Surgeon: Jordis Laneta FALCON, MD;  Location: [facility] ORS;  Service: General;  Laterality: N/A;                Patient Active Problem List    Diagnosis Date Noted   History of stroke 06/08/2023   Moderate aortic stenosis 06/08/2023   Bone spur     Hemorrhoids     Hyperlipidemia     Hypertension     Sleep apnea     Essential hypertension 06/04/2023   Diabetes mellitus treated with injections of non-insulin  medication (HCC) 06/04/2023   Anxiety and depression 06/04/2023   Dyslipidemia 06/04/2023   H/O migraine with aura 05/29/2023   Stress reaction 05/29/2023   Encounter for medication refill 05/29/2023   Ocular migraine with status migrainosus 03/04/2023   Vitamin D  deficiency 11/02/2022   History of colonic polyps 08/30/2022   Erythrocytosis 05/07/2022   Hand pain, left 07/09/2020   Lateral epicondylitis, left elbow 07/09/2020   Radial nerve compression, left 07/09/2020   Traumatic ecchymosis of left hand 07/09/2020   Rupture of left long head biceps tendon 01/14/2020   Uncontrolled type 2 diabetes mellitus with hyperglycemia (HCC) 04/18/2018   Benign neoplasm of rectosigmoid junction     Internal hemorrhoids     Residual hemorrhoidal skin tags     Diverticulosis of large intestine without diverticulitis     Stomach irritation     GERD (gastroesophageal reflux disease)     History of diverticulitis 12/26/2017   Onychomycosis 12/26/2017   Adhesive capsulitis of left shoulder 12/15/2017   Diverticulitis large  intestine 09/2017   Impingement syndrome of left shoulder region 07/05/2017   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   Umbilical hernia without obstruction and without gangrene     Bilateral inguinal hernia without obstruction or gangrene     History of tobacco abuse 11/26/2016   Migraines     Rotator cuff tendinitis, left 11/12/2015   Labral tear of shoulder, left, initial encounter 11/12/2015      PCP: Melvin Pao, NP   REFERRING PROVIDER: Tobie Franky SQUIBB, DPM   REFERRING DIAG:  Diagnosis  [phone] (ICD-10-CM) - Posterior tibial tendinitis, left  M76.72 (ICD-10-CM) - Peroneal tendinitis, left  M77.51 (ICD-10-CM) - Capsulitis of right ankle    THERAPY DIAG:  Pain in right ankle and joints of right foot   Ankle joint stiffness, right   Muscle weakness (generalized)   Gait  difficulty   Pain in left ankle and joints of left foot   Rationale for Evaluation and Treatment: Rehabilitation   ONSET DATE: chronic   SUBJECTIVE:    SUBJECTIVE STATEMENT: Pt. Reports chronic L ankle/foot pain.  Pt. States pain started 3-4 months ago.  No specific MOI and pt. Received a cortisone injection yesterday to L ankle.  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     PERTINENT HISTORY:         Foot Pain      Pt stated that he is still having some pain     53 y.o. male presents with the above complaint.  Patient presents for follow-up of left posterior tibial tendinitis/peroneal tendinitis here to go over the MRI results still bothers him a little bit.  He has secondary complaint of right ankle pain.  He denies any other acute complaints he would like to discuss treatment options for the ankle pain as well   Orthopedic: Pain on palpation along the course of the posterior tibial tendon pain along the course of the peroneal tendon mild pain on palpation no pain at the Achilles tendon.  No pain at the ATFL ligament   Right ankle pain hurts with ambulation and  deep intra-articular ankle pain noted.  No crepitus noted    Patient was evaluated and treated and all questions answered.   Left peroneal tendinitis/posterior tibial tendinitis - All questions and concerns were discussed with the patient in extensive detail - MRI did not show any pathology.  At this time patient would like to try physical therapy with dry needling dry needling prescription with physical therapy was given to the patient   Right ankle capsulitis - Given the amount of pain that is having he will benefit from steroid injection help decrease of inflammatory components of joint pain.  Patient agrees with plan like to proceed with steroid injection -A steroid injection was performed at right ankle using 1% plain Lidocaine  and 10 mg of Kenalog. This was well tolerated.   PAIN:  Are you having pain? Yes: NPRS scale: 2/10 Pain location: L ankle Pain description: sharp pain/ feels like someone is pushing a knuckle into ankle/heel Aggravating factors: increase activity Relieving factors: Topical gel, rest, using ace bandage to L ankle   0/10 pain at best and 10/10 at worst.     PRECAUTIONS: None   RED FLAGS: None      WEIGHT BEARING RESTRICTIONS: No   FALLS:  Has patient fallen in last 6 months? 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     PLOF: Independent   PATIENT GOALS: Decrease L foot/ankle pain with walking/ work-related tasks.    NEXT MD VISIT: 6 weeks   OBJECTIVE:  Note: Objective measures were completed at Evaluation unless otherwise noted.   DIAGNOSTIC FINDINGS: EXAM DESCRIPTION: MR FOOT LEFT WO CONTRAST   CLINICAL HISTORY: Left peroneal tendinitis/posterior tibial tendinitis   COMPARISON: None Available.   TECHNIQUE: MRI of the foot is performed according to our usual  protocol with multiplanar multi sequence imaging.   FINDINGS: No fracture. No erosions. No significant degenerative change. The marrow signal is unremarkable. No joint effusion. Adequate fat in the sinus tarsi.   Moderate subcutaneous edema. There is no significant tendinopathy or tear to the peroneal tendons. The tendons and ligaments are unremarkable. The  musculature is unremarkable.   IMPRESSION: Moderate subcutaneous edema. Correlate for lymphedema versus cellulitis.   The exam is otherwise unremarkable.   Electronically signed by: Reyes Frees MD 01/07/2024 10:13 AM EDT RP   PATIENT SURVEYS:  LEFS: 37 out of 80.     COGNITION: Overall cognitive status: Within functional limits for tasks assessed                         SENSATION: WFL   EDEMA:  No palpable edema noted.    MUSCLE LENGTH: Hamstrings: NT Thomas test: NT   POSTURE: rounded shoulders   PALPATION: (+) L ankle (medial > lateral) tenderness with generalized. palpation    LOWER EXTREMITY ROM:   Active ROM Right eval Left eval  Hip flexion WNL WNL  Hip extension      Hip abduction WNL WNL  Hip adduction      Hip internal rotation      Hip external rotation      Knee flexion WNL WNL  Knee extension WNL WNL  Ankle dorsiflexion 16 deg. 18 deg.  Ankle plantarflexion 23 deg. 15 deg.  Ankle inversion 30 deg. 10 deg. (Pain)  Ankle eversion 28 deg. 12 deg. (Pain)   (Blank rows = not tested)   LOWER EXTREMITY MMT:   MMT Right eval Left eval  Hip flexion 4 (LBP) 4 (LBP)  Hip extension      Hip abduction 4 4  Hip adduction 4- 4-  Hip internal rotation      Hip external rotation      Knee flexion 4 4-  Knee extension 4 (LBP) 4- (LBP)  Ankle dorsiflexion 4+ 4- (pain)  Ankle plantarflexion 4 3+  Ankle inversion      Ankle eversion       (Blank rows = not tested)   LOWER EXTREMITY SPECIAL TESTS:  Assessment of R ankle mobility (pain limited with hand placement).     FUNCTIONAL TESTS:  5  times sit to stand: 23.2 seconds   GAIT: Distance walked: in clinic Assistive device utilized: None Level of assistance: Complete Independence Comments: Normalized gait with decrease stride length/ increase ankle pain with increase distance walked. Discussed orthotics/ [employer] Feet to assess feet for proper supportive sneakers.                                                                                            TREATMENT DATE: 03/13/2024   Subjective:  Pt. reports 1/10 left medial arch and 2/10 left distal achilles tendon pain at the start of today's session. Pt. states that over the past few days, the pain has primarily been in his left distal achilles tendon and into the left calf muscle.    Ther Ex.  Long Sitting Isometric Ankle Inversion/Eversion 1x10 (with therapist tactile feedback) Long Sitting Isometric Ankle Plantarflexion/Dorsiflexion 1x10 (with therapist tactile feedback) Standing Towel Scrunches 1x15 Seated Heel Raises with ball squeezes, 2x15 Standing Gastroc/Soleus Stretch 3x20 sec. Standing Single Leg Eccentric Heel Raises, 3x10 Standing Hip Abduction, 1x15 bilaterally    Assessed hallway ambulation ~90 feet with supervision assist (patient left foot  remained inverted during open chain (swing phase) and closed chained phases of gait and slightly antalgic.   Manual Therapy:  Supine position:  Contract-relax technique into PF Manual Left Gastroc Stretching  IASTM to medial arch region of left ankle (occasional mild tenderness noted)  Grade IV Subtalar Mobilizations all directions  Grade I-II A/P Talocrural Mobilizations  Passive Great Toe Extension (pain noted initially)    PATIENT EDUCATION:  Education details: [employer]et/ L ankle isometrics (all planes).   Person educated: Patient Education method: Explanation, Demonstration, and Handouts Education comprehension: verbalized understanding and returned demonstration   HOME EXERCISE PROGRAM: Access Code:  [email or web address] URL: [email or web address] Date: 02/22/2024 Prepared by: Ozell Sero   Exercises - Standing Gastroc Stretch  - 1 x daily - 7 x weekly - 3 reps - 30 hold - Long Sitting Isometric Ankle Eversion in Dorsiflexion with Ball at Wall  - 1 x daily - 7 x weekly - 3 sets - 8 reps - 5 hold - Long Sitting Isometric Ankle Inversion in Plantar Flexion with Ball at Wall  - 1 x daily - 7 x weekly - 3 sets - 8 reps - 5 hold - Long Sitting Isometric Ankle Plantarflexion with Ball at Wall  - 1 x daily - 7 x weekly - 3 sets - 8 reps - Towel Scrunches  - 1 x daily - 7 x weekly - 1 sets - 10 reps  Access Code: [email or web address] URL: [email or web address] Date: 02/29/2024 Prepared by: Ozell Sero Exercises - Standing Gastroc Stretch - 1 x daily - 7 x weekly - 3 reps - 30 hold - Soleus Stretch on Wall - 1 x daily - 7 x weekly - 3 sets - 30 hold - Long Sitting Isometric Ankle Eversion in Dorsiflexion with Ball at Wall - 1 x daily - 7 x weekly - 3 sets - 8 reps - 5 hold - Long Sitting Isometric Ankle Inversion in Plantar Flexion with Ball at Wall - 1 x daily - 7 x weekly - 3 sets - 8 reps - 5 hold - Towel Scrunches - 1 x daily - 7 x weekly - 2 sets - 12 reps - Seated Calf Raise With Small Ball at Heels - 1 x daily - 7 x weekly - 3 sets - 8 reps - Standing Heel Raises - 1 x daily - 7 x weekly - 3 sets - 8 reps    ASSESSMENT:   CLINICAL IMPRESSION: Performed supine subtalar mobilizations (all directions) to grade IV on this day to promote increased joint mobility which patient responded well to. Pt. Was introduced to new exercise on this day which was standing hip abduction to increase bilateral hip strength/endurance and introduce single leg stance on left LE which he tolerated well. Pt. Was progressed to completing standing eccentric heel raises off step to single leg on this day which he was able to complete but did require multiple standing rest breaks during the exercise secondary to  fatigue. Pt. will continue to benefit from skilled physical therapy to address current deficits and maximize functional outcomes.    OBJECTIVE IMPAIRMENTS: Abnormal gait, decreased activity tolerance, decreased balance, decreased mobility, difficulty walking, decreased ROM, decreased strength, hypomobility, improper body mechanics, postural dysfunction, and pain.    ACTIVITY LIMITATIONS: lifting, squatting, stairs, and locomotion level   PARTICIPATION LIMITATIONS: community activity, occupation, and yard work   PERSONAL FACTORS: Fitness and Past/current experiences are also affecting patient's functional outcome.    REHAB POTENTIAL: Good   CLINICAL DECISION MAKING: Evolving/moderate complexity  EVALUATION COMPLEXITY: Moderate     GOALS: Goals reviewed with patient? Yes   SHORT TERM GOALS: Target date: 03/14/24 Pt. Independent with HEP to increase L ankle AROM to WNL as compared to R ankle to improve pain-free mobility.   Baseline:  see above Goal status: On-going   2.  Pt. Will report no palpable pain along L medial/lateral aspects of ankle to improve pain-free mobility.   Baseline:  (+) tenderness noted.  Goal status: Partially met     LONG TERM GOALS: Target date: 04/04/24   Pt. Will increase LEFS to > 60 out of 80 to improve pain-free mobility with work-related tasks.   Baseline: initial 37 out of 80.   Goal status: INITIAL   2.  Pt. Able to ambulate with normalized gait pattern with proper fitting shoes with on c/o ankle/foot pain to improve pain-free mobility with work-related tasks.  Baseline:  Goal status: INITIAL   3.  Pt. Able to push lawnmower with no c/o ankle/foot pain to improve pain-free mobility.   Baseline: ankle pain with work-related tasks.  Goal status: INITIAL     PLAN:   PT FREQUENCY: 2x/week     PT DURATION: 6 weeks   PLANNED INTERVENTIONS: 97750- Physical Performance Testing, 97110-Therapeutic exercises, 97530- Therapeutic activity, [id number]-  Neuromuscular re-education, 97535- Self Care, [id number]- Manual therapy, [phone]- Gait training, [phone]- Electrical stimulation (unattended), [phone]- Electrical stimulation (manual), 20560 (1-2 muscles), 20561 (3+ muscles)- Dry Needling, Patient/Family education, Balance training, Stair training, Taping, Joint mobilization, Cryotherapy, and Moist heat   PLAN FOR NEXT SESSION: Introduce dynamic isometric tendon and gross LE strength activities to tolerance (heel/toe walking or lateral walks on foam).     Curtistine Bracket, SPT  Ozell JAYSON Sero, PT, DPT # [phone] 03/13/2024

## 2024-03-16 ENCOUNTER — Ambulatory Visit: Admitting: Physical Therapy

## 2024-03-16 DIAGNOSIS — M25572 Pain in left ankle and joints of left foot: Secondary | ICD-10-CM

## 2024-03-16 DIAGNOSIS — M6281 Muscle weakness (generalized): Secondary | ICD-10-CM | POA: Diagnosis not present

## 2024-03-16 DIAGNOSIS — M7751 Other enthesopathy of right foot: Secondary | ICD-10-CM | POA: Diagnosis not present

## 2024-03-16 DIAGNOSIS — R269 Unspecified abnormalities of gait and mobility: Secondary | ICD-10-CM | POA: Diagnosis not present

## 2024-03-16 DIAGNOSIS — M25571 Pain in right ankle and joints of right foot: Secondary | ICD-10-CM | POA: Diagnosis not present

## 2024-03-16 DIAGNOSIS — M25671 Stiffness of right ankle, not elsewhere classified: Secondary | ICD-10-CM | POA: Diagnosis not present

## 2024-03-16 DIAGNOSIS — M7672 Peroneal tendinitis, left leg: Secondary | ICD-10-CM | POA: Diagnosis not present

## 2024-03-16 DIAGNOSIS — M76822 Posterior tibial tendinitis, left leg: Secondary | ICD-10-CM | POA: Diagnosis not present

## 2024-03-16 NOTE — Therapy (Signed)
 OUTPATIENT PHYSICAL THERAPY LOWER EXTREMITY TREATMENT   Patient Name: Keith Barber MRN: [id number] DOB:1970-09-16, 53 y.o., male Today's Date: 03/16/2024    PT End of Session - 03/16/24 0816     Visit Number 6    Number of Visits 12    Date for PT Re-Evaluation 04/04/24    PT Start Time 0817    PT Stop Time 0904    PT Time Calculation (min) 47 min    Activity Tolerance Patient tolerated treatment well;No increased pain    Behavior During Therapy [facility] for tasks assessed/performed                 Past Medical History:  Diagnosis Date   Acute CVA (cerebrovascular accident) (HCC) 06/04/2023   Adhesive capsulitis of left shoulder 12/15/2017   Anxiety and depression 06/04/2023   Benign neoplasm of rectosigmoid junction     Bilateral inguinal hernia without obstruction or gangrene     Bone spur      LEFT SHOULDER THAT MAKES HIS LEFT ARM TINGLE - had removed   Diverticulitis large intestine 09/2017   Diverticulosis of large intestine without diverticulitis     Dyslipidemia 06/04/2023   Encounter for medication refill 05/29/2023   Erythrocytosis 05/07/2022   Essential hypertension 06/04/2023   GERD (gastroesophageal reflux disease)     H/O migraine with aura 05/29/2023   Hand pain, left 07/09/2020   Hemorrhoids     History of colonic polyps 08/30/2022   History of diverticulitis 12/26/2017   History of tobacco abuse 11/26/2016   Hyperlipidemia     Hyperlipidemia associated with type 2 diabetes mellitus (HCC)     Hypertension     Hypertension associated with diabetes (HCC)     Impingement syndrome of left shoulder region 07/05/2017   Internal hemorrhoids     Labral tear of shoulder, left, initial encounter 11/12/2015   Lateral epicondylitis, left elbow 07/09/2020   Migraines      rare migraines   Ocular migraine with status migrainosus 03/04/2023   Onychomycosis 12/26/2017   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   Radial nerve compression,  left 07/09/2020   Residual hemorrhoidal skin tags     Rotator cuff tendinitis, left 11/12/2015   Rupture of left long head biceps tendon 01/14/2020   Sleep apnea     Stomach irritation     Stress reaction 05/29/2023   Traumatic ecchymosis of left hand 07/09/2020   Type 2 diabetes mellitus without complications (HCC) 06/04/2023   Umbilical hernia without obstruction and without gangrene     Uncontrolled type 2 diabetes mellitus with hyperglycemia (HCC) 04/18/2018   Vitamin D  deficiency 11/02/2022                  Past Surgical History:  Procedure Laterality Date   12 HOUR PH STUDY N/A 05/30/2018    Procedure: 24 HOUR PH STUDY;  Surgeon: Janalyn Keene NOVAK, MD;  Location: [facility] ENDOSCOPY;  Service: Gastroenterology;  Laterality: N/A;   bicep tendon tear        COLONOSCOPY WITH PROPOFOL  N/A 02/01/2018    Procedure: COLONOSCOPY WITH PROPOFOL ;  Surgeon: Janalyn Keene NOVAK, MD;  Location: [facility] ENDOSCOPY;  Service: Endoscopy;  Laterality: N/A;   COLONOSCOPY WITH PROPOFOL  N/A 08/30/2022    Procedure: COLONOSCOPY WITH PROPOFOL ;  Surgeon: Therisa Bi, MD;  Location: [facility] ENDOSCOPY;  Service: Gastroenterology;  Laterality: N/A;   ESOPHAGEAL MANOMETRY N/A 05/30/2018    Procedure: ESOPHAGEAL MANOMETRY (EM);  Surgeon: Janalyn Keene NOVAK, MD;  Location:  [facility] ENDOSCOPY;  Service: Gastroenterology;  Laterality: N/A;   ESOPHAGOGASTRODUODENOSCOPY (EGD) WITH PROPOFOL  N/A 02/01/2018    Procedure: ESOPHAGOGASTRODUODENOSCOPY (EGD) WITH PROPOFOL ;  Surgeon: Janalyn Keene NOVAK, MD;  Location: [facility] ENDOSCOPY;  Service: Endoscopy;  Laterality: N/A;   HERNIA REPAIR       INGUINAL HERNIA REPAIR Bilateral 01/21/2017    Procedure: LAPAROSCOPIC BILATERAL INGUINAL HERNIA REPAIR;  Surgeon: Jordis Laneta FALCON, MD;  Location: [facility] ORS;  Service: General;  Laterality: Bilateral;   KNEE SURGERY Left     SHOULDER ARTHROSCOPY WITH DISTAL CLAVICLE RESECTION Left 07/05/2017    Procedure: LEFT SHOULDER ARTHROSCOPY,  SUBACROMIAL DECOMPRESSION, DISTAL CLAVICLE RESECTION, POSSIBLE MINI OPEN ROTATOR CUFF REPAIR;  Surgeon: Anderson Maude ORN, MD;  Location: [facility] OR;  Service: Orthopedics;  Laterality: Left;   SHOULDER CLOSED REDUCTION Left 12/13/2017    Procedure: LEFT SHOULDER MANIPULATION with steroid injection;  Surgeon: Anderson Maude ORN, MD;  Location: [facility] OR;  Service: Orthopedics;  Laterality: Left;   SHOULDER SURGERY Right     UMBILICAL HERNIA REPAIR N/A 01/21/2017    Procedure: HERNIA REPAIR UMBILICAL ADULT;  Surgeon: Jordis Laneta FALCON, MD;  Location: [facility] ORS;  Service: General;  Laterality: N/A;                Patient Active Problem List    Diagnosis Date Noted   History of stroke 06/08/2023   Moderate aortic stenosis 06/08/2023   Bone spur     Hemorrhoids     Hyperlipidemia     Hypertension     Sleep apnea     Essential hypertension 06/04/2023   Diabetes mellitus treated with injections of non-insulin  medication (HCC) 06/04/2023   Anxiety and depression 06/04/2023   Dyslipidemia 06/04/2023   H/O migraine with aura 05/29/2023   Stress reaction 05/29/2023   Encounter for medication refill 05/29/2023   Ocular migraine with status migrainosus 03/04/2023   Vitamin D  deficiency 11/02/2022   History of colonic polyps 08/30/2022   Erythrocytosis 05/07/2022   Hand pain, left 07/09/2020   Lateral epicondylitis, left elbow 07/09/2020   Radial nerve compression, left 07/09/2020   Traumatic ecchymosis of left hand 07/09/2020   Rupture of left long head biceps tendon 01/14/2020   Uncontrolled type 2 diabetes mellitus with hyperglycemia (HCC) 04/18/2018   Benign neoplasm of rectosigmoid junction     Internal hemorrhoids     Residual hemorrhoidal skin tags     Diverticulosis of large intestine without diverticulitis     Stomach irritation     GERD (gastroesophageal reflux disease)     History of diverticulitis 12/26/2017   Onychomycosis 12/26/2017   Adhesive capsulitis of left shoulder 12/15/2017    Diverticulitis large intestine 09/2017   Impingement syndrome of left shoulder region 07/05/2017   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   Umbilical hernia without obstruction and without gangrene     Bilateral inguinal hernia without obstruction or gangrene     History of tobacco abuse 11/26/2016   Migraines     Rotator cuff tendinitis, left 11/12/2015   Labral tear of shoulder, left, initial encounter 11/12/2015      PCP: Melvin Pao, NP   REFERRING PROVIDER: Tobie Franky SQUIBB, DPM   REFERRING DIAG:  Diagnosis  [phone] (ICD-10-CM) - Posterior tibial tendinitis, left  M76.72 (ICD-10-CM) - Peroneal tendinitis, left  M77.51 (ICD-10-CM) - Capsulitis of right ankle    THERAPY DIAG:  Pain in right ankle and joints of right foot   Ankle joint stiffness, right   Muscle weakness (generalized)  Gait difficulty   Pain in left ankle and joints of left foot   Rationale for Evaluation and Treatment: Rehabilitation   ONSET DATE: chronic   SUBJECTIVE:    SUBJECTIVE STATEMENT: Pt. Reports chronic L ankle/foot pain.  Pt. States pain started 3-4 months ago.  No specific MOI and pt. Received a cortisone injection yesterday to L ankle.  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     PERTINENT HISTORY:         Foot Pain      Pt stated that he is still having some pain     53 y.o. male presents with the above complaint.  Patient presents for follow-up of left posterior tibial tendinitis/peroneal tendinitis here to go over the MRI results still bothers him a little bit.  He has secondary complaint of right ankle pain.  He denies any other acute complaints he would like to discuss treatment options for the ankle pain as well   Orthopedic: Pain on palpation along the course of the posterior tibial tendon pain along the course of the peroneal tendon mild pain on palpation no pain at the Achilles tendon.  No pain at the ATFL ligament   Right ankle pain  hurts with ambulation and deep intra-articular ankle pain noted.  No crepitus noted    Patient was evaluated and treated and all questions answered.   Left peroneal tendinitis/posterior tibial tendinitis - All questions and concerns were discussed with the patient in extensive detail - MRI did not show any pathology.  At this time patient would like to try physical therapy with dry needling dry needling prescription with physical therapy was given to the patient   Right ankle capsulitis - Given the amount of pain that is having he will benefit from steroid injection help decrease of inflammatory components of joint pain.  Patient agrees with plan like to proceed with steroid injection -A steroid injection was performed at right ankle using 1% plain Lidocaine  and 10 mg of Kenalog. This was well tolerated.   PAIN:  Are you having pain? Yes: NPRS scale: 2/10 Pain location: L ankle Pain description: sharp pain/ feels like someone is pushing a knuckle into ankle/heel Aggravating factors: increase activity Relieving factors: Topical gel, rest, using ace bandage to L ankle   0/10 pain at best and 10/10 at worst.     PRECAUTIONS: None   RED FLAGS: None      WEIGHT BEARING RESTRICTIONS: No   FALLS:  Has patient fallen in last 6 months? 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     PLOF: Independent   PATIENT GOALS: Decrease L foot/ankle pain with walking/ work-related tasks.    NEXT MD VISIT: 6 weeks   OBJECTIVE:  Note: Objective measures were completed at Evaluation unless otherwise noted.   DIAGNOSTIC FINDINGS: EXAM DESCRIPTION: MR FOOT LEFT WO CONTRAST   CLINICAL HISTORY: Left peroneal tendinitis/posterior tibial tendinitis   COMPARISON: None Available.   TECHNIQUE: MRI of the foot is performed  according to our usual protocol with multiplanar multi sequence imaging.   FINDINGS: No fracture. No erosions. No significant degenerative change. The marrow signal is unremarkable. No joint effusion. Adequate fat in the sinus tarsi.   Moderate subcutaneous edema. There is no significant tendinopathy or tear to the peroneal tendons. The tendons and ligaments are unremarkable.  The musculature is unremarkable.   IMPRESSION: Moderate subcutaneous edema. Correlate for lymphedema versus cellulitis.   The exam is otherwise unremarkable.   Electronically signed by: Reyes Frees MD 01/07/2024 10:13 AM EDT RP   PATIENT SURVEYS:  LEFS: 37 out of 80.     COGNITION: Overall cognitive status: Within functional limits for tasks assessed                         SENSATION: WFL   EDEMA:  No palpable edema noted.    MUSCLE LENGTH: Hamstrings: NT Thomas test: NT   POSTURE: rounded shoulders   PALPATION: (+) L ankle (medial > lateral) tenderness with generalized. palpation    LOWER EXTREMITY ROM:   Active ROM Right eval Left eval  Hip flexion WNL WNL  Hip extension      Hip abduction WNL WNL  Hip adduction      Hip internal rotation      Hip external rotation      Knee flexion WNL WNL  Knee extension WNL WNL  Ankle dorsiflexion 16 deg. 18 deg.  Ankle plantarflexion 23 deg. 15 deg.  Ankle inversion 30 deg. 10 deg. (Pain)  Ankle eversion 28 deg. 12 deg. (Pain)   (Blank rows = not tested)   LOWER EXTREMITY MMT:   MMT Right eval Left eval  Hip flexion 4 (LBP) 4 (LBP)  Hip extension      Hip abduction 4 4  Hip adduction 4- 4-  Hip internal rotation      Hip external rotation      Knee flexion 4 4-  Knee extension 4 (LBP) 4- (LBP)  Ankle dorsiflexion 4+ 4- (pain)  Ankle plantarflexion 4 3+  Ankle inversion      Ankle eversion       (Blank rows = not tested)   LOWER EXTREMITY SPECIAL TESTS:  Assessment of R ankle mobility (pain limited with hand placement).      FUNCTIONAL TESTS:  5 times sit to stand: 23.2 seconds   GAIT: Distance walked: in clinic Assistive device utilized: None Level of assistance: Complete Independence Comments: Normalized gait with decrease stride length/ increase ankle pain with increase distance walked. Discussed orthotics/ Fleet Feet to assess feet for proper supportive sneakers.                                                                                            TREATMENT DATE: 03/16/2024   Subjective:  Pt. reports 1-2/10 left medial arch with no left distal calf or achilles tendon pain at the start of today's session. Pt. 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    Ther Ex.  Long Sitting Isometric Ankle Inversion/Eversion 1x10 (with therapist manual resistance) Long Sitting Isometric Ankle Plantarflexion/Dorsiflexion 1x10 (with therapist manual resistance) Standing Towel Scrunches 2x10 Seated Heel Raises with ball squeezes, 2x15  Standing Gastroc/Soleus Stretch 3x20 sec. Standing Single Leg Eccentric Heel Raises, 3x10 SLS on Airexpad, 3x25  seconds bilaterally Lateral walking with GTB below knees in // bars, 5 passes down and back (verbally cued for maintaining slight knee flexion and upright posture) Lateral walking on Airex Pad in // bars, 4 passes down and back  Manual Therapy:  Supine position:  Contract-relax technique into PF Manual Left Gastroc Stretching  IASTM to medial arch region of left ankle (mild medial arch tenderness noted)  Grade IV Subtalar Mobilizations all directions  Grade I-II A/P Talocrural Mobilizations  Passive Great Toe Extension (slight increase in pain) Grade III-IV Talocrural Distraction   Left AROM:  Inversion 20 deg. (No pain reported) Eversion: 12 deg. (no pain reported)    PATIENT EDUCATION:  Education details: [employer] Feet/ L  ankle isometrics (all planes).   Person educated: Patient Education method: Explanation, Demonstration, and Handouts Education comprehension: verbalized understanding and returned demonstration   HOME EXERCISE PROGRAM: Access Code: [email or web address] URL: [email or web address] Date: 02/22/2024 Prepared by: Ozell Sero   Exercises - Standing Gastroc Stretch  - 1 x daily - 7 x weekly - 3 reps - 30 hold - Long Sitting Isometric Ankle Eversion in Dorsiflexion with Ball at Wall  - 1 x daily - 7 x weekly - 3 sets - 8 reps - 5 hold - Long Sitting Isometric Ankle Inversion in Plantar Flexion with Ball at Wall  - 1 x daily - 7 x weekly - 3 sets - 8 reps - 5 hold - Long Sitting Isometric Ankle Plantarflexion with Ball at Wall  - 1 x daily - 7 x weekly - 3 sets - 8 reps - Towel Scrunches  - 1 x daily - 7 x weekly - 1 sets - 10 reps  Access Code: [email or web address] URL: [email or web address] Date: 02/29/2024 Prepared by: Ozell Sero Exercises - Standing Gastroc Stretch - 1 x daily - 7 x weekly - 3 reps - 30 hold - Soleus Stretch on Wall - 1 x daily - 7 x weekly - 3 sets - 30 hold - Long Sitting Isometric Ankle Eversion in Dorsiflexion with Ball at Wall - 1 x daily - 7 x weekly - 3 sets - 8 reps - 5 hold - Long Sitting Isometric Ankle Inversion in Plantar Flexion with Ball at Wall - 1 x daily - 7 x weekly - 3 sets - 8 reps - 5 hold - Towel Scrunches - 1 x daily - 7 x weekly - 2 sets - 12 reps - Seated Calf Raise With Small Ball at Heels - 1 x daily - 7 x weekly - 3 sets - 8 reps - Standing Heel Raises - 1 x daily - 7 x weekly - 3 sets - 8 reps    ASSESSMENT:   CLINICAL IMPRESSION: Reassessed left eversion (12 deg.) and inversion (20 deg.) with no pain reported. Initiated talocrural distraction to promote talocrural mobility in all directions in supine position which patient tolerated well. Pt. Was introduced to three new exercises on this day which were SLS on AirexPad, lateral walking on  AirexPad in // bars, and lateral walking with GTB below knees in // bars. Pt. Tolerated lateral walking activities targeted at improving bilateral hip endurance but did require verbal cueing to keep his knees slightly flexed and upper body upright which he was able to correct. Pt. Did note a slight increase in left medial arch pain during SLS on foam pad but the pain had dissipated to a 0/10 within a few minutes after the exercise. Pt. will continue to benefit from skilled physical therapy to address current  deficits and maximize functional outcomes.    OBJECTIVE IMPAIRMENTS: Abnormal gait, decreased activity tolerance, decreased balance, decreased mobility, difficulty walking, decreased ROM, decreased strength, hypomobility, improper body mechanics, postural dysfunction, and pain.    ACTIVITY LIMITATIONS: lifting, squatting, stairs, and locomotion level   PARTICIPATION LIMITATIONS: community activity, occupation, and yard work   PERSONAL FACTORS: Fitness and Past/current experiences are also affecting patient's functional outcome.    REHAB POTENTIAL: Good   CLINICAL DECISION MAKING: Evolving/moderate complexity   EVALUATION COMPLEXITY: Moderate     GOALS: Goals reviewed with patient? Yes   SHORT TERM GOALS: Target date: 03/14/24 Pt. Independent with HEP to increase L ankle AROM to WNL as compared to R ankle to improve pain-free mobility.   Baseline:  see above Goal status: On-going   2.  Pt. Will report no palpable pain along L medial/lateral aspects of ankle to improve pain-free mobility.   Baseline:  (+) tenderness noted.  Goal status: Partially met     LONG TERM GOALS: Target date: 04/04/24   Pt. Will increase LEFS to > 60 out of 80 to improve pain-free mobility with work-related tasks.   Baseline: initial 37 out of 80.   Goal status: INITIAL   2.  Pt. Able to ambulate with normalized gait pattern with proper fitting shoes with on c/o ankle/foot pain to improve pain-free  mobility with work-related tasks.  Baseline:  Goal status: INITIAL   3.  Pt. Able to push lawnmower with no c/o ankle/foot pain to improve pain-free mobility.   Baseline: ankle pain with work-related tasks.  Goal status: INITIAL     PLAN:   PT FREQUENCY: 2x/week     PT DURATION: 6 weeks   PLANNED INTERVENTIONS: 97750- Physical Performance Testing, 97110-Therapeutic exercises, 97530- Therapeutic activity, [id number]- Neuromuscular re-education, 97535- Self Care, 02859- Manual therapy, [phone]- Gait training, [phone]- Electrical stimulation (unattended), [phone]- Electrical stimulation (manual), 20560 (1-2 muscles), 20561 (3+ muscles)- Dry Needling, Patient/Family education, Balance training, Stair training, Taping, Joint mobilization, Cryotherapy, and Moist heat   PLAN FOR NEXT SESSION: Continue to introduce single leg strengthening activities as tolerable and/or tasks relevant to his job such as sled pushes/pulls.     Curtistine Bracket, SPT  Ozell JAYSON Sero, PT, DPT # [phone] 03/16/2024

## 2024-03-21 ENCOUNTER — Ambulatory Visit: Attending: Podiatry | Admitting: Physical Therapy

## 2024-03-21 DIAGNOSIS — R269 Unspecified abnormalities of gait and mobility: Secondary | ICD-10-CM | POA: Insufficient documentation

## 2024-03-21 DIAGNOSIS — M25671 Stiffness of right ankle, not elsewhere classified: Secondary | ICD-10-CM | POA: Diagnosis present

## 2024-03-21 DIAGNOSIS — M25572 Pain in left ankle and joints of left foot: Secondary | ICD-10-CM | POA: Insufficient documentation

## 2024-03-21 DIAGNOSIS — M6281 Muscle weakness (generalized): Secondary | ICD-10-CM | POA: Diagnosis present

## 2024-03-21 NOTE — Therapy (Unsigned)
 OUTPATIENT PHYSICAL THERAPY LOWER EXTREMITY TREATMENT   Patient Name: Keith Barber MRN: [id number] DOB:1971-01-24, 53 y.o., male Today's Date: 03/21/2024    PT End of Session - 03/21/24 0817     Visit Number 7    Number of Visits 12    Date for PT Re-Evaluation 04/04/24    PT Start Time 0817    PT Stop Time 0900    PT Time Calculation (min) 43 min    Activity Tolerance Patient tolerated treatment well;Patient limited by pain    Behavior During Therapy [facility] for tasks assessed/performed                 Past Medical History:  Diagnosis Date   Acute CVA (cerebrovascular accident) (HCC) 06/04/2023   Adhesive capsulitis of left shoulder 12/15/2017   Anxiety and depression 06/04/2023   Benign neoplasm of rectosigmoid junction     Bilateral inguinal hernia without obstruction or gangrene     Bone spur      LEFT SHOULDER THAT MAKES HIS LEFT ARM TINGLE - had removed   Diverticulitis large intestine 09/2017   Diverticulosis of large intestine without diverticulitis     Dyslipidemia 06/04/2023   Encounter for medication refill 05/29/2023   Erythrocytosis 05/07/2022   Essential hypertension 06/04/2023   GERD (gastroesophageal reflux disease)     H/O migraine with aura 05/29/2023   Hand pain, left 07/09/2020   Hemorrhoids     History of colonic polyps 08/30/2022   History of diverticulitis 12/26/2017   History of tobacco abuse 11/26/2016   Hyperlipidemia     Hyperlipidemia associated with type 2 diabetes mellitus (HCC)     Hypertension     Hypertension associated with diabetes (HCC)     Impingement syndrome of left shoulder region 07/05/2017   Internal hemorrhoids     Labral tear of shoulder, left, initial encounter 11/12/2015   Lateral epicondylitis, left elbow 07/09/2020   Migraines      rare migraines   Ocular migraine with status migrainosus 03/04/2023   Onychomycosis 12/26/2017   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   Radial nerve  compression, left 07/09/2020   Residual hemorrhoidal skin tags     Rotator cuff tendinitis, left 11/12/2015   Rupture of left long head biceps tendon 01/14/2020   Sleep apnea     Stomach irritation     Stress reaction 05/29/2023   Traumatic ecchymosis of left hand 07/09/2020   Type 2 diabetes mellitus without complications (HCC) 06/04/2023   Umbilical hernia without obstruction and without gangrene     Uncontrolled type 2 diabetes mellitus with hyperglycemia (HCC) 04/18/2018   Vitamin D  deficiency 11/02/2022                  Past Surgical History:  Procedure Laterality Date   24 HOUR PH STUDY N/A 05/30/2018    Procedure: 24 HOUR PH STUDY;  Surgeon: Janalyn Keene NOVAK, MD;  Location: [facility] ENDOSCOPY;  Service: Gastroenterology;  Laterality: N/A;   bicep tendon tear        COLONOSCOPY WITH PROPOFOL  N/A 02/01/2018    Procedure: COLONOSCOPY WITH PROPOFOL ;  Surgeon: Janalyn Keene NOVAK, MD;  Location: [facility] ENDOSCOPY;  Service: Endoscopy;  Laterality: N/A;   COLONOSCOPY WITH PROPOFOL  N/A 08/30/2022    Procedure: COLONOSCOPY WITH PROPOFOL ;  Surgeon: Therisa Bi, MD;  Location: [facility]COPY;  Service: Gastroenterology;  Laterality: N/A;   ESOPHAGEAL MANOMETRY N/A 05/30/2018    Procedure: ESOPHAGEAL MANOMETRY (EM);  Surgeon: Janalyn Keene NOVAK, MD;  Location: [facility] ENDOSCOPY;  Service: Gastroenterology;  Laterality: N/A;   ESOPHAGOGASTRODUODENOSCOPY (EGD) WITH PROPOFOL  N/A 02/01/2018    Procedure: ESOPHAGOGASTRODUODENOSCOPY (EGD) WITH PROPOFOL ;  Surgeon: Janalyn Keene NOVAK, MD;  Location: [facility] ENDOSCOPY;  Service: Endoscopy;  Laterality: N/A;   HERNIA REPAIR       INGUINAL HERNIA REPAIR Bilateral 01/21/2017    Procedure: LAPAROSCOPIC BILATERAL INGUINAL HERNIA REPAIR;  Surgeon: Jordis Laneta FALCON, MD;  Location: [facility] ORS;  Service: General;  Laterality: Bilateral;   KNEE SURGERY Left     SHOULDER ARTHROSCOPY WITH DISTAL CLAVICLE RESECTION Left 07/05/2017    Procedure: LEFT SHOULDER  ARTHROSCOPY, SUBACROMIAL DECOMPRESSION, DISTAL CLAVICLE RESECTION, POSSIBLE MINI OPEN ROTATOR CUFF REPAIR;  Surgeon: Anderson Maude ORN, MD;  Location: [facility] OR;  Service: Orthopedics;  Laterality: Left;   SHOULDER CLOSED REDUCTION Left 12/13/2017    Procedure: LEFT SHOULDER MANIPULATION with steroid injection;  Surgeon: Anderson Maude ORN, MD;  Location: [facility] OR;  Service: Orthopedics;  Laterality: Left;   SHOULDER SURGERY Right     UMBILICAL HERNIA REPAIR N/A 01/21/2017    Procedure: HERNIA REPAIR UMBILICAL ADULT;  Surgeon: Jordis Laneta FALCON, MD;  Location: [facility] ORS;  Service: General;  Laterality: N/A;                Patient Active Problem List    Diagnosis Date Noted   History of stroke 06/08/2023   Moderate aortic stenosis 06/08/2023   Bone spur     Hemorrhoids     Hyperlipidemia     Hypertension     Sleep apnea     Essential hypertension 06/04/2023   Diabetes mellitus treated with injections of non-insulin  medication (HCC) 06/04/2023   Anxiety and depression 06/04/2023   Dyslipidemia 06/04/2023   H/O migraine with aura 05/29/2023   Stress reaction 05/29/2023   Encounter for medication refill 05/29/2023   Ocular migraine with status migrainosus 03/04/2023   Vitamin D  deficiency 11/02/2022   History of colonic polyps 08/30/2022   Erythrocytosis 05/07/2022   Hand pain, left 07/09/2020   Lateral epicondylitis, left elbow 07/09/2020   Radial nerve compression, left 07/09/2020   Traumatic ecchymosis of left hand 07/09/2020   Rupture of left long head biceps tendon 01/14/2020   Uncontrolled type 2 diabetes mellitus with hyperglycemia (HCC) 04/18/2018   Benign neoplasm of rectosigmoid junction     Internal hemorrhoids     Residual hemorrhoidal skin tags     Diverticulosis of large intestine without diverticulitis     Stomach irritation     GERD (gastroesophageal reflux disease)     History of diverticulitis 12/26/2017   Onychomycosis 12/26/2017   Adhesive capsulitis of left shoulder  12/15/2017   Diverticulitis large intestine 09/2017   Impingement syndrome of left shoulder region 07/05/2017   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   Umbilical hernia without obstruction and without gangrene     Bilateral inguinal hernia without obstruction or gangrene     History of tobacco abuse 11/26/2016   Migraines     Rotator cuff tendinitis, left 11/12/2015   Labral tear of shoulder, left, initial encounter 11/12/2015      PCP: Melvin Pao, NP   REFERRING PROVIDER: Tobie Franky SQUIBB, DPM   REFERRING DIAG:  Diagnosis  [phone] (ICD-10-CM) - Posterior tibial tendinitis, left  M76.72 (ICD-10-CM) - Peroneal tendinitis, left  M77.51 (ICD-10-CM) - Capsulitis of right ankle    THERAPY DIAG:  Pain in right ankle and joints of right foot   Ankle joint stiffness, right   Muscle weakness (  generalized)   Gait difficulty   Pain in left ankle and joints of left foot   Rationale for Evaluation and Treatment: Rehabilitation   ONSET DATE: chronic   SUBJECTIVE:    SUBJECTIVE STATEMENT: Pt. Reports chronic L ankle/foot pain.  Pt. States pain started 3-4 months ago.  No specific MOI and pt. Received a cortisone injection yesterday to L ankle.  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     PERTINENT HISTORY:         Foot Pain      Pt stated that he is still having some pain     53 y.o. male presents with the above complaint.  Patient presents for follow-up of left posterior tibial tendinitis/peroneal tendinitis here to go over the MRI results still bothers him a little bit.  He has secondary complaint of right ankle pain.  He denies any other acute complaints he would like to discuss treatment options for the ankle pain as well   Orthopedic: Pain on palpation along the course of the posterior tibial tendon pain along the course of the peroneal tendon mild pain on palpation no pain at the Achilles tendon.  No pain at the ATFL ligament   Right  ankle pain hurts with ambulation and deep intra-articular ankle pain noted.  No crepitus noted    Patient was evaluated and treated and all questions answered.   Left peroneal tendinitis/posterior tibial tendinitis - All questions and concerns were discussed with the patient in extensive detail - MRI did not show any pathology.  At this time patient would like to try physical therapy with dry needling dry needling prescription with physical therapy was given to the patient   Right ankle capsulitis - Given the amount of pain that is having he will benefit from steroid injection help decrease of inflammatory components of joint pain.  Patient agrees with plan like to proceed with steroid injection -A steroid injection was performed at right ankle using 1% plain Lidocaine  and 10 mg of Kenalog. This was well tolerated.   PAIN:  Are you having pain? Yes: NPRS scale: 2/10 Pain location: L ankle Pain description: sharp pain/ feels like someone is pushing a knuckle into ankle/heel Aggravating factors: increase activity Relieving factors: Topical gel, rest, using ace bandage to L ankle   0/10 pain at best and 10/10 at worst.     PRECAUTIONS: None   RED FLAGS: None      WEIGHT BEARING RESTRICTIONS: No   FALLS:  Has patient fallen in last 6 months? 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     PLOF: Independent   PATIENT GOALS: Decrease L foot/ankle pain with walking/ work-related tasks.    NEXT MD VISIT: 6 weeks   OBJECTIVE:  Note: Objective measures were completed at Evaluation unless otherwise noted.   DIAGNOSTIC FINDINGS: EXAM DESCRIPTION: MR FOOT LEFT WO CONTRAST   CLINICAL HISTORY: Left peroneal tendinitis/posterior tibial tendinitis   COMPARISON: None Available.   TECHNIQUE: MRI of the foot is  performed according to our usual protocol with multiplanar multi sequence imaging.   FINDINGS: No fracture. No erosions. No significant degenerative change. The marrow signal is unremarkable. No joint effusion. Adequate fat in the sinus tarsi.   Moderate subcutaneous edema. There is no significant tendinopathy or tear to the peroneal tendons. The tendons and  ligaments are unremarkable. The musculature is unremarkable.   IMPRESSION: Moderate subcutaneous edema. Correlate for lymphedema versus cellulitis.   The exam is otherwise unremarkable.   Electronically signed by: Reyes Frees MD 01/07/2024 10:13 AM EDT RP   PATIENT SURVEYS:  LEFS: 37 out of 80.     COGNITION: Overall cognitive status: Within functional limits for tasks assessed                         SENSATION: WFL   EDEMA:  No palpable edema noted.    MUSCLE LENGTH: Hamstrings: NT Thomas test: NT   POSTURE: rounded shoulders   PALPATION: (+) L ankle (medial > lateral) tenderness with generalized. palpation    LOWER EXTREMITY ROM:   Active ROM Right eval Left eval  Hip flexion WNL WNL  Hip extension      Hip abduction WNL WNL  Hip adduction      Hip internal rotation      Hip external rotation      Knee flexion WNL WNL  Knee extension WNL WNL  Ankle dorsiflexion 16 deg. 18 deg.  Ankle plantarflexion 23 deg. 15 deg.  Ankle inversion 30 deg. 10 deg. (Pain)  Ankle eversion 28 deg. 12 deg. (Pain)   (Blank rows = not tested)   LOWER EXTREMITY MMT:   MMT Right eval Left eval  Hip flexion 4 (LBP) 4 (LBP)  Hip extension      Hip abduction 4 4  Hip adduction 4- 4-  Hip internal rotation      Hip external rotation      Knee flexion 4 4-  Knee extension 4 (LBP) 4- (LBP)  Ankle dorsiflexion 4+ 4- (pain)  Ankle plantarflexion 4 3+  Ankle inversion      Ankle eversion       (Blank rows = not tested)   LOWER EXTREMITY SPECIAL TESTS:  Assessment of R ankle mobility (pain limited with hand  placement).     FUNCTIONAL TESTS:  5 times sit to stand: 23.2 seconds   GAIT: Distance walked: in clinic Assistive device utilized: None Level of assistance: Complete Independence Comments: Normalized gait with decrease stride length/ increase ankle pain with increase distance walked. Discussed orthotics/ [employer]et to assess feet for proper supportive sneakers.                     Left AROM:  Inversion 20 deg. (No pain reported) Eversion: 12 deg. (no pain reported)                                                                         TREATMENT DATE: 03/21/2024   Subjective:  Pt. reports 3/10 left medial arch with no left distal calf or achilles tendon pain at the start of today's session. Pt. 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    Ther Ex.  Supine Isometric Ankle Inversion/Eversion 1x10 (with therapist manual resistance) Supine Isometric Ankle Plantarflexion/Dorsiflexion 1x10 (with therapist manual resistance) Standing Towel Scrunches 2x10  Supine Brides, 1x10 Sidelying hip abduction, 2x12 bilaterally Seated Heel Raises with ball squeezes, 2x15  Standing Gastroc/Soleus Stretch 3x20 sec. Standing Single Leg Eccentric Heel Raises, 3x8 SLS on Airexpad, 4x20 seconds bilaterally TRX Squats, 3x8  Manual Therapy:  Supine position:  Contract-relax technique into PF Manual Left Gastroc Stretching  IASTM to medial arch region of left ankle (mild medial arch tenderness noted, less than previous sessions)  Grade IV Subtalar Mobilizations all directions  Grade I-II A/P Talocrural Mobilizations  Passive Great Toe Extension (no increased pain on this day) Grade III-IV Talocrural Distraction     PATIENT EDUCATION:  Education details: [employer]/ L ankle isometrics (all planes).   Person educated: Patient Education method:  Explanation, Demonstration, and Handouts Education comprehension: verbalized understanding and returned demonstration   HOME EXERCISE PROGRAM: Access Code: [email or web address] URL: [email or web address] Date: 02/22/2024 Prepared by: Ozell Sero   Exercises - Standing Gastroc Stretch  - 1 x daily - 7 x weekly - 3 reps - 30 hold - Long Sitting Isometric Ankle Eversion in Dorsiflexion with Ball at Wall  - 1 x daily - 7 x weekly - 3 sets - 8 reps - 5 hold - Long Sitting Isometric Ankle Inversion in Plantar Flexion with Ball at Wall  - 1 x daily - 7 x weekly - 3 sets - 8 reps - 5 hold - Long Sitting Isometric Ankle Plantarflexion with Ball at Wall  - 1 x daily - 7 x weekly - 3 sets - 8 reps - Towel Scrunches  - 1 x daily - 7 x weekly - 1 sets - 10 reps  Access Code: [email or web address] URL: [email or web address] Date: 02/29/2024 Prepared by: Ozell Sero Exercises - Standing Gastroc Stretch - 1 x daily - 7 x weekly - 3 reps - 30 hold - Soleus Stretch on Wall - 1 x daily - 7 x weekly - 3 sets - 30 hold - Long Sitting Isometric Ankle Eversion in Dorsiflexion with Ball at Wall - 1 x daily - 7 x weekly - 3 sets - 8 reps - 5 hold - Long Sitting Isometric Ankle Inversion in Plantar Flexion with Ball at Wall - 1 x daily - 7 x weekly - 3 sets - 8 reps - 5 hold - Towel Scrunches - 1 x daily - 7 x weekly - 2 sets - 12 reps - Seated Calf Raise With Small Ball at Heels - 1 x daily - 7 x weekly - 3 sets - 8 reps - Standing Heel Raises - 1 x daily - 7 x weekly - 3 sets - 8 reps    ASSESSMENT:   CLINICAL IMPRESSION: Focus of today's session was to progress gross bilateral LE strength while increasing endurance capacity of ankle in SLS positions. Pt. was introduced to supine bridges, sidelying hip abduction, and TRX squats on this day which he tolerated well. Pt. Experienced bilateral hip fatigue and discomfort during sidelying hip abduction and required multiple rest breaks during each set in order to  complete. Pt. did experience an increase in bilateral knee pain during TRX squats but was able to complete the exercise. Pt. Continues to experience left medial arch fatigue during SLS stance on Airexpad during the final two sets which were performed to fatigue. Pt. will continue to benefit from skilled physical therapy to address current deficits and maximize functional outcomes.    OBJECTIVE IMPAIRMENTS: Abnormal gait, decreased activity tolerance, decreased balance, decreased mobility, difficulty walking, decreased ROM, decreased strength, hypomobility, improper body mechanics, postural dysfunction, and pain.  ACTIVITY LIMITATIONS: lifting, squatting, stairs, and locomotion level   PARTICIPATION LIMITATIONS: community activity, occupation, and yard work   PERSONAL FACTORS: Fitness and Past/current experiences are also affecting patient's functional outcome.    REHAB POTENTIAL: Good   CLINICAL DECISION MAKING: Evolving/moderate complexity   EVALUATION COMPLEXITY: Moderate     GOALS: Goals reviewed with patient? Yes   SHORT TERM GOALS: Target date: 03/14/24 Pt. Independent with HEP to increase L ankle AROM to WNL as compared to R ankle to improve pain-free mobility.   Baseline:  see above Goal status: On-going   2.  Pt. Will report no palpable pain along L medial/lateral aspects of ankle to improve pain-free mobility.   Baseline:  (+) tenderness noted.  Goal status: Partially met     LONG TERM GOALS: Target date: 04/04/24   Pt. Will increase LEFS to > 60 out of 80 to improve pain-free mobility with work-related tasks.   Baseline: initial 37 out of 80.   Goal status: INITIAL   2.  Pt. Able to ambulate with normalized gait pattern with proper fitting shoes with on c/o ankle/foot pain to improve pain-free mobility with work-related tasks.  Baseline:  Goal status: INITIAL   3.  Pt. Able to push lawnmower with no c/o ankle/foot pain to improve pain-free mobility.   Baseline:  ankle pain with work-related tasks.  Goal status: INITIAL     PLAN:   PT FREQUENCY: 2x/week     PT DURATION: 6 weeks   PLANNED INTERVENTIONS: 97750- Physical Performance Testing, 97110-Therapeutic exercises, 97530- Therapeutic activity, [id number]- Neuromuscular re-education, 97535- Self Care, [id number]- Manual therapy, [phone]- Gait training, [phone]- Electrical stimulation (unattended), [phone]- Electrical stimulation (manual), 20560 (1-2 muscles), 20561 (3+ muscles)- Dry Needling, Patient/Family education, Balance training, Stair training, Taping, Joint mobilization, Cryotherapy, and Moist heat   PLAN FOR NEXT SESSION: Continue close chain activities that promote ankle mobility and LE strength (squats) and/or tasks specifically relevant to his job such as sled pushes/pulls.     Curtistine Bracket, SPT  Ozell JAYSON Sero, PT, DPT # [phone] 03/21/2024

## 2024-03-22 ENCOUNTER — Encounter: Payer: Self-pay | Admitting: Physical Therapy

## 2024-03-23 ENCOUNTER — Ambulatory Visit: Admitting: Physical Therapy

## 2024-03-23 DIAGNOSIS — M6281 Muscle weakness (generalized): Secondary | ICD-10-CM

## 2024-03-23 DIAGNOSIS — R269 Unspecified abnormalities of gait and mobility: Secondary | ICD-10-CM

## 2024-03-23 DIAGNOSIS — M25671 Stiffness of right ankle, not elsewhere classified: Secondary | ICD-10-CM

## 2024-03-23 DIAGNOSIS — M25572 Pain in left ankle and joints of left foot: Secondary | ICD-10-CM | POA: Diagnosis not present

## 2024-03-23 NOTE — Progress Notes (Signed)
 OUTPATIENT PHYSICAL THERAPY LOWER EXTREMITY TREATMENT   Patient Name: Keith Barber MRN: [id number] DOB:[birth year], 53 y.o., male Today's Date: 03/23/2024   END OF SESSION:  PT End of Session - 03/23/24 0817     Visit Number 8    Number of Visits 12    Date for PT Re-Evaluation 04/04/24    PT Start Time 0817    PT Stop Time 0900    PT Time Calculation (min) 43 min    Activity Tolerance Patient tolerated treatment well;No increased pain    Behavior During Therapy [facility] for tasks assessed/performed               Past Medical History:  Diagnosis Date   Acute CVA (cerebrovascular accident) (HCC) 06/04/2023   Adhesive capsulitis of left shoulder 12/15/2017   Anxiety and depression 06/04/2023   Benign neoplasm of rectosigmoid junction     Bilateral inguinal hernia without obstruction or gangrene     Bone spur      LEFT SHOULDER THAT MAKES HIS LEFT ARM TINGLE - had removed   Diverticulitis large intestine 09/2017   Diverticulosis of large intestine without diverticulitis     Dyslipidemia 06/04/2023   Encounter for medication refill 05/29/2023   Erythrocytosis 05/07/2022   Essential hypertension 06/04/2023   GERD (gastroesophageal reflux disease)     H/O migraine with aura 05/29/2023   Hand pain, left 07/09/2020   Hemorrhoids     History of colonic polyps 08/30/2022   History of diverticulitis 12/26/2017   History of tobacco abuse 11/26/2016   Hyperlipidemia     Hyperlipidemia associated with type 2 diabetes mellitus (HCC)     Hypertension     Hypertension associated with diabetes (HCC)     Impingement syndrome of left shoulder region 07/05/2017   Internal hemorrhoids     Labral tear of shoulder, left, initial encounter 11/12/2015   Lateral epicondylitis, left elbow 07/09/2020   Migraines      rare migraines   Ocular migraine with status migrainosus 03/04/2023   Onychomycosis 12/26/2017   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   Radial nerve  compression, left 07/09/2020   Residual hemorrhoidal skin tags     Rotator cuff tendinitis, left 11/12/2015   Rupture of left long head biceps tendon 01/14/2020   Sleep apnea     Stomach irritation     Stress reaction 05/29/2023   Traumatic ecchymosis of left hand 07/09/2020   Type 2 diabetes mellitus without complications (HCC) 06/04/2023   Umbilical hernia without obstruction and without gangrene     Uncontrolled type 2 diabetes mellitus with hyperglycemia (HCC) 04/18/2018   Vitamin D  deficiency 11/02/2022             Past Surgical History:  Procedure Laterality Date   24 HOUR PH STUDY N/A 05/30/2018    Procedure: 24 HOUR PH STUDY;  Surgeon: Janalyn Keene NOVAK, MD;  Location: [facility] ENDOSCOPY;  Service: Gastroenterology;  Laterality: N/A;   bicep tendon tear        COLONOSCOPY WITH PROPOFOL  N/A 02/01/2018    Procedure: COLONOSCOPY WITH PROPOFOL ;  Surgeon: Janalyn Keene NOVAK, MD;  Location: [facility] ENDOSCOPY;  Service: Endoscopy;  Laterality: N/A;   COLONOSCOPY WITH PROPOFOL  N/A 08/30/2022    Procedure: COLONOSCOPY WITH PROPOFOL ;  Surgeon: Therisa Bi, MD;  Location: [facility];  Service: Gastroenterology;  Laterality: N/A;   ESOPHAGEAL MANOMETRY N/A 05/30/2018    Procedure: ESOPHAGEAL MANOMETRY (EM);  Surgeon: Janalyn Keene NOVAK, MD;  Location: [facility] ENDOSCOPY;  Service:  Gastroenterology;  Laterality: N/A;   ESOPHAGOGASTRODUODENOSCOPY (EGD) WITH PROPOFOL  N/A 02/01/2018    Procedure: ESOPHAGOGASTRODUODENOSCOPY (EGD) WITH PROPOFOL ;  Surgeon: Janalyn Keene NOVAK, MD;  Location: [facility] ENDOSCOPY;  Service: Endoscopy;  Laterality: N/A;   HERNIA REPAIR       INGUINAL HERNIA REPAIR Bilateral 01/21/2017    Procedure: LAPAROSCOPIC BILATERAL INGUINAL HERNIA REPAIR;  Surgeon: Jordis Laneta FALCON, MD;  Location: [facility] ORS;  Service: General;  Laterality: Bilateral;   KNEE SURGERY Left     SHOULDER ARTHROSCOPY WITH DISTAL CLAVICLE RESECTION Left 07/05/2017    Procedure: LEFT SHOULDER ARTHROSCOPY,  SUBACROMIAL DECOMPRESSION, DISTAL CLAVICLE RESECTION, POSSIBLE MINI OPEN ROTATOR CUFF REPAIR;  Surgeon: Anderson Maude ORN, MD;  Location: [facility] OR;  Service: Orthopedics;  Laterality: Left;   SHOULDER CLOSED REDUCTION Left 12/13/2017    Procedure: LEFT SHOULDER MANIPULATION with steroid injection;  Surgeon: Anderson Maude ORN, MD;  Location: [facility] OR;  Service: Orthopedics;  Laterality: Left;   SHOULDER SURGERY Right     UMBILICAL HERNIA REPAIR N/A 01/21/2017    Procedure: HERNIA REPAIR UMBILICAL ADULT;  Surgeon: Jordis Laneta FALCON, MD;  Location: [facility] ORS;  Service: General;  Laterality: N/A;            Patient Active Problem List    Diagnosis Date Noted   History of stroke 06/08/2023   Moderate aortic stenosis 06/08/2023   Bone spur     Hemorrhoids     Hyperlipidemia     Hypertension     Sleep apnea     Essential hypertension 06/04/2023   Diabetes mellitus treated with injections of non-insulin  medication (HCC) 06/04/2023   Anxiety and depression 06/04/2023   Dyslipidemia 06/04/2023   H/O migraine with aura 05/29/2023   Stress reaction 05/29/2023   Encounter for medication refill 05/29/2023   Ocular migraine with status migrainosus 03/04/2023   Vitamin D  deficiency 11/02/2022   History of colonic polyps 08/30/2022   Erythrocytosis 05/07/2022   Hand pain, left 07/09/2020   Lateral epicondylitis, left elbow 07/09/2020   Radial nerve compression, left 07/09/2020   Traumatic ecchymosis of left hand 07/09/2020   Rupture of left long head biceps tendon 01/14/2020   Uncontrolled type 2 diabetes mellitus with hyperglycemia (HCC) 04/18/2018   Benign neoplasm of rectosigmoid junction     Internal hemorrhoids     Residual hemorrhoidal skin tags     Diverticulosis of large intestine without diverticulitis     Stomach irritation     GERD (gastroesophageal reflux disease)     History of diverticulitis 12/26/2017   Onychomycosis 12/26/2017   Adhesive capsulitis of left shoulder 12/15/2017    Diverticulitis large intestine 09/2017   Impingement syndrome of left shoulder region 07/05/2017   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   Umbilical hernia without obstruction and without gangrene     Bilateral inguinal hernia without obstruction or gangrene     History of tobacco abuse 11/26/2016   Migraines     Rotator cuff tendinitis, left 11/12/2015   Labral tear of shoulder, left, initial encounter 11/12/2015    PCP: Melvin Pao, NP   REFERRING PROVIDER: Tobie Franky SQUIBB, DPM   REFERRING DIAG:  Diagnosis  [phone] (ICD-10-CM) - Posterior tibial tendinitis, left  M76.72 (ICD-10-CM) - Peroneal tendinitis, left  M77.51 (ICD-10-CM) - Capsulitis of right ankle    THERAPY DIAG:  Pain in right ankle and joints of right foot   Ankle joint stiffness, right   Muscle weakness (generalized)   Gait difficulty   Pain in left ankle  and joints of left foot   Rationale for Evaluation and Treatment: Rehabilitation   ONSET DATE: chronic   SUBJECTIVE:    SUBJECTIVE STATEMENT: Pt. Reports chronic L ankle/foot pain.  Pt. States pain started 3-4 months ago.  No specific MOI and pt. Received a cortisone injection yesterday to L ankle.  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     PERTINENT HISTORY:      Foot Pain      Pt stated that he is still having some pain     53 y.o. male presents with the above complaint.  Patient presents for follow-up of left posterior tibial tendinitis/peroneal tendinitis here to go over the MRI results still bothers him a little bit.  He has secondary complaint of right ankle pain.  He denies any other acute complaints he would like to discuss treatment options for the ankle pain as well   Orthopedic: Pain on palpation along the course of the posterior tibial tendon pain along the course of the peroneal tendon mild pain on palpation no pain at the Achilles tendon.  No pain at the ATFL ligament   Right ankle pain hurts with  ambulation and deep intra-articular ankle pain noted.  No crepitus noted    Patient was evaluated and treated and all questions answered.   Left peroneal tendinitis/posterior tibial tendinitis - All questions and concerns were discussed with the patient in extensive detail - MRI did not show any pathology.  At this time patient would like to try physical therapy with dry needling dry needling prescription with physical therapy was given to the patient   Right ankle capsulitis - Given the amount of pain that is having he will benefit from steroid injection help decrease of inflammatory components of joint pain.  Patient agrees with plan like to proceed with steroid injection -A steroid injection was performed at right ankle using 1% plain Lidocaine  and 10 mg of Kenalog. This was well tolerated.   PAIN:  Are you having pain? Yes: NPRS scale: 2/10 Pain location: L ankle Pain description: sharp pain/ feels like someone is pushing a knuckle into ankle/heel Aggravating factors: increase activity Relieving factors: Topical gel, rest, using ace bandage to L ankle   0/10 pain at best and 10/10 at worst.     PRECAUTIONS: None   RED FLAGS: None      WEIGHT BEARING RESTRICTIONS: No   FALLS:  Has patient fallen in last 6 months? 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     PLOF: Independent   PATIENT GOALS: Decrease L foot/ankle pain with walking/ work-related tasks.    NEXT MD VISIT: 6 weeks   OBJECTIVE:  Note: Objective measures were completed at Evaluation unless otherwise noted.   DIAGNOSTIC FINDINGS: EXAM DESCRIPTION: MR FOOT LEFT WO CONTRAST   CLINICAL HISTORY: Left peroneal tendinitis/posterior tibial tendinitis   COMPARISON: None Available.   TECHNIQUE: MRI of the foot is performed according  to our usual protocol with multiplanar multi sequence imaging.   FINDINGS: No fracture. No erosions. No significant degenerative change. The marrow signal is unremarkable. No joint effusion. Adequate fat in the sinus tarsi.   Moderate subcutaneous edema. There is no significant tendinopathy or tear to the peroneal tendons. The tendons and ligaments are unremarkable. The musculature is unremarkable.   IMPRESSION: Moderate subcutaneous edema. Correlate  for lymphedema versus cellulitis.   The exam is otherwise unremarkable.   Electronically signed by: Reyes Frees MD 01/07/2024 10:13 AM EDT RP   PATIENT SURVEYS:  LEFS: 37 out of 80.     COGNITION: Overall cognitive status: Within functional limits for tasks assessed                         SENSATION: WFL   EDEMA:  No palpable edema noted.    MUSCLE LENGTH: Hamstrings: NT Thomas test: NT   POSTURE: rounded shoulders   PALPATION: (+) L ankle (medial > lateral) tenderness with generalized. palpation    LOWER EXTREMITY ROM:   Active ROM Right eval Left eval  Hip flexion WNL WNL  Hip extension      Hip abduction WNL WNL  Hip adduction      Hip internal rotation      Hip external rotation      Knee flexion WNL WNL  Knee extension WNL WNL  Ankle dorsiflexion 16 deg. 18 deg.  Ankle plantarflexion 23 deg. 15 deg.  Ankle inversion 30 deg. 10 deg. (Pain)  Ankle eversion 28 deg. 12 deg. (Pain)   (Blank rows = not tested)   LOWER EXTREMITY MMT:   MMT Right eval Left eval  Hip flexion 4 (LBP) 4 (LBP)  Hip extension      Hip abduction 4 4  Hip adduction 4- 4-  Hip internal rotation      Hip external rotation      Knee flexion 4 4-  Knee extension 4 (LBP) 4- (LBP)  Ankle dorsiflexion 4+ 4- (pain)  Ankle plantarflexion 4 3+  Ankle inversion      Ankle eversion       (Blank rows = not tested)   LOWER EXTREMITY SPECIAL TESTS:  Assessment of R ankle mobility (pain limited with hand placement).     FUNCTIONAL  TESTS:  5 times sit to stand: TBD   GAIT: Distance walked: in clinic Assistive device utilized: None Level of assistance: Complete Independence Comments: Normalized gait with decrease stride length/ increase ankle pain with increase distance walked. Discussed orthotics/ [employer]et to assess feet for proper supportive sneakers.   5 time sit to stand time: 23.2 seconds                                                                                           TREATMENT DATE: 03/23/2024   Subjective:  Pt. reports 0/10 left achilles tendon pain at the start of today's session and a mild 2/10 left medial arch pain. Pt. States that his left foot pain has been very minimal the past few days as he only experiences mild pain in the medial arch at the end of the day. Pt. Does report moderate low back pain that he has been feeling for a few weeks now.   Ther Act. Long Sitting Isometric Ankle Inversion/Eversion 2x10 Long Sitting Isometric Ankle Plantarflexion 2x10 Seated Heel Raises with ball squeezes, 2x12 Standing Towel Scrunches 2x10 Seated Heel Raises with ball squeezes, 2x15 Seated Eccentric Heel Raises off step 3x8  Standing Gastroc/Soleus Stretch  3x30 sec. SLS on Airexpad, 5x20 seconds bilaterally Sled Pushes/Pulls (25#), 35 ft. In hallway, 5 x down and back  Manual Therapy:  Supine position Manual Left Gastroc Stretching  IASTM to medial arch region of left ankle (with and without passive great toe extension) Contract-relax technique into DF of left ankle Grade III-IV Subtalar Mobilizations all directions  Grade III-IV A/P Talocrural Mobilizations   Pt. Educated on anatomy of plantar fascia and importance of continued activity to tolerance  Updated and issued HEP. Pt. Verbalized understanding.    PATIENT EDUCATION:  Education details: [employer]/ L ankle isometrics (all planes).   Person educated: Patient Education method: Explanation, Demonstration, and Handouts Education  comprehension: verbalized understanding and returned demonstration   HOME EXERCISE PROGRAM: Access Code: [email or web address] URL: [email or web address] Date: 02/22/2024 Prepared by: Ozell Sero   Exercises - Standing Gastroc Stretch  - 1 x daily - 7 x weekly - 3 reps - 30 hold - Long Sitting Isometric Ankle Eversion in Dorsiflexion with Ball at Wall  - 1 x daily - 7 x weekly - 3 sets - 8 reps - 5 hold - Long Sitting Isometric Ankle Inversion in Plantar Flexion with Ball at Wall  - 1 x daily - 7 x weekly - 3 sets - 8 reps - 5 hold - Long Sitting Isometric Ankle Plantarflexion with Ball at Wall  - 1 x daily - 7 x weekly - 3 sets - 8 reps - Towel Scrunches  - 1 x daily - 7 x weekly - 1 sets - 10 reps  Access Code: [email or web address] URL: [email or web address] Date: 03/23/2024 Prepared by: Ozell Sero Exercises - Standing Gastroc Stretch - 1 x daily - 7 x weekly - 3 reps - 30 hold - Soleus Stretch on Wall - 1 x daily - 7 x weekly - 3 sets - 30 hold - Long Sitting Isometric Ankle Inversion in Plantar Flexion with Ball at Wall - 1 x daily - 4-5 x weekly - 3 sets - 8 reps - 5 hold - Towel Scrunches - 1 x daily - 4-5 x weekly - 2 sets - 12 reps - Seated Calf Raise With Small Ball at Heels - 1 x daily - 4-5 x weekly - 2 sets - 15 reps - Standing Eccentric Heel Raise - 1 x daily - 4-5 x weekly - 3 sets - 8 reps - Supine Bridge - 1 x daily - 5 x weekly - 3 sets - 10 reps - Sit to Stand Without Arm Support - 1 x daily - 5 x weekly - 3 sets - 10 reps    ASSESSMENT:   CLINICAL IMPRESSION: Pt. presents with minimal pain in his left medial arch region and with no significant pain or tenderness during IASTM to the region even when the plantar fascia was put on slack with passive great toe extension. Pt. Was introduced to activity on this day which was sled pushes/pulls in the hallway to increase gross LE strength while mimicking functional lawn mowing task which patient was able to tolerate without  significant difficulty or pain. Pt. Reported zero increase in pain during or after SLS on left LE on foam Airex pad on this day indicating an improvement in endurance capacity of involved tendons and LE musculature. Pt. Issued updated HEP on this day and verbalized understanding of program. Pt. will continue to benefit from skilled physical therapy to address current deficits and maximize functional outcomes.    OBJECTIVE IMPAIRMENTS: Abnormal gait, decreased activity tolerance, decreased balance, decreased mobility,  difficulty walking, decreased ROM, decreased strength, hypomobility, improper body mechanics, postural dysfunction, and pain.    ACTIVITY LIMITATIONS: lifting, squatting, stairs, and locomotion level   PARTICIPATION LIMITATIONS: community activity, occupation, and yard work   PERSONAL FACTORS: Fitness and Past/current experiences are also affecting patient's functional outcome.    REHAB POTENTIAL: Good   CLINICAL DECISION MAKING: Evolving/moderate complexity   EVALUATION COMPLEXITY: Moderate     GOALS: Goals reviewed with patient? Yes   SHORT TERM GOALS: Target date: 03/14/24 Pt. Independent with HEP to increase L ankle AROM to WNL as compared to R ankle to improve pain-free mobility.   Baseline:  see above Goal status: Partially met   2.  Pt. Will report no palpable pain along L medial/lateral aspects of ankle to improve pain-free mobility.   Baseline:  (+) tenderness noted.  Goal status: Not met     LONG TERM GOALS: Target date: 04/04/24   Pt. Will increase LEFS to > 60 out of 80 to improve pain-free mobility with work-related tasks.   Baseline: initial 37 out of 80.   Goal status: INITIAL   2.  Pt. Able to ambulate with normalized gait pattern with proper fitting shoes with on c/o ankle/foot pain to improve pain-free mobility with work-related tasks.  Baseline:  Goal status: INITIAL   3.  Pt. Able to push lawnmower with no c/o ankle/foot pain to improve pain-free  mobility.   Baseline: ankle pain with work-related tasks.  Goal status: INITIAL     PLAN:   PT FREQUENCY: 2x/week     PT DURATION: 6 weeks   PLANNED INTERVENTIONS: 97750- Physical Performance Testing, 97110-Therapeutic exercises, 97530- Therapeutic activity, [id number]- Neuromuscular re-education, 97535- Self Care, [id number]- Manual therapy, [phone]- Gait training, [phone]- Electrical stimulation (unattended), [phone]- Electrical stimulation (manual), [phone] (1-2 muscles), [phone] (3+ muscles)- Dry Needling, Patient/Family education, Balance training, Stair training, Taping, Joint mobilization, Cryotherapy, and Moist heat   PLAN FOR NEXT SESSION: Incorporate functional transfers/movements during strength training such as sit to stands while holding light weight and challenge hip strength in different planes of motion.   CHECK GOALS    Curtistine Bracket, SPT  Ozell JAYSON Sero, PT, DPT # [phone] 03/23/2024

## 2024-03-26 ENCOUNTER — Ambulatory Visit: Admitting: Physical Therapy

## 2024-03-26 DIAGNOSIS — R269 Unspecified abnormalities of gait and mobility: Secondary | ICD-10-CM

## 2024-03-26 DIAGNOSIS — M6281 Muscle weakness (generalized): Secondary | ICD-10-CM

## 2024-03-26 DIAGNOSIS — M25572 Pain in left ankle and joints of left foot: Secondary | ICD-10-CM

## 2024-03-26 DIAGNOSIS — M25671 Stiffness of right ankle, not elsewhere classified: Secondary | ICD-10-CM

## 2024-03-26 NOTE — Therapy (Signed)
 OUTPATIENT PHYSICAL THERAPY LOWER EXTREMITY TREATMENT   Patient Name: Keith Barber MRN: [id number] DOB:12-01-1970, 53 y.o., male Today's Date: 03/26/2024    PT End of Session - 03/26/24 0818     Visit Number 9    Number of Visits 12    Date for PT Re-Evaluation 04/04/24    PT Start Time 0818    PT Stop Time 0903    PT Time Calculation (min) 45 min    Activity Tolerance Patient tolerated treatment well;Patient limited by fatigue;Patient limited by pain    Behavior During Therapy [place] for tasks assessed/performed                 Past Medical History:  Diagnosis Date   Acute CVA (cerebrovascular accident) (HCC) 06/04/2023   Adhesive capsulitis of left shoulder 12/15/2017   Anxiety and depression 06/04/2023   Benign neoplasm of rectosigmoid junction     Bilateral inguinal hernia without obstruction or gangrene     Bone spur      LEFT SHOULDER THAT MAKES HIS LEFT ARM TINGLE - had removed   Diverticulitis large intestine 09/2017   Diverticulosis of large intestine without diverticulitis     Dyslipidemia 06/04/2023   Encounter for medication refill 05/29/2023   Erythrocytosis 05/07/2022   Essential hypertension 06/04/2023   GERD (gastroesophageal reflux disease)     H/O migraine with aura 05/29/2023   Hand pain, left 07/09/2020   Hemorrhoids     History of colonic polyps 08/30/2022   History of diverticulitis 12/26/2017   History of tobacco abuse 11/26/2016   Hyperlipidemia     Hyperlipidemia associated with type 2 diabetes mellitus (HCC)     Hypertension     Hypertension associated with diabetes (HCC)     Impingement syndrome of left shoulder region 07/05/2017   Internal hemorrhoids     Labral tear of shoulder, left, initial encounter 11/12/2015   Lateral epicondylitis, left elbow 07/09/2020   Migraines      rare migraines   Ocular migraine with status migrainosus 03/04/2023   Onychomycosis 12/26/2017   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    Radial nerve compression, left 07/09/2020   Residual hemorrhoidal skin tags     Rotator cuff tendinitis, left 11/12/2015   Rupture of left long head biceps tendon 01/14/2020   Sleep apnea     Stomach irritation     Stress reaction 05/29/2023   Traumatic ecchymosis of left hand 07/09/2020   Type 2 diabetes mellitus without complications (HCC) 06/04/2023   Umbilical hernia without obstruction and without gangrene     Uncontrolled type 2 diabetes mellitus with hyperglycemia (HCC) 04/18/2018   Vitamin D  deficiency 11/02/2022                  Past Surgical History:  Procedure Laterality Date   24 HOUR PH STUDY N/A 05/30/2018    Procedure: 24 HOUR PH STUDY;  Surgeon: Janalyn Keene NOVAK, MD;  Location: [facility] ENDOSCOPY;  Service: Gastroenterology;  Laterality: N/A;   bicep tendon tear        COLONOSCOPY WITH PROPOFOL  N/A 02/01/2018    Procedure: COLONOSCOPY WITH PROPOFOL ;  Surgeon: Janalyn Keene NOVAK, MD;  Location: [facility] ENDOSCOPY;  Service: Endoscopy;  Laterality: N/A;   COLONOSCOPY WITH PROPOFOL  N/A 08/30/2022    Procedure: COLONOSCOPY WITH PROPOFOL ;  Surgeon: Therisa Bi, MD;  Location: [facility]COPY;  Service: Gastroenterology;  Laterality: N/A;   ESOPHAGEAL MANOMETRY N/A 05/30/2018    Procedure: ESOPHAGEAL MANOMETRY (EM);  Surgeon: Tahiliani, Varnita  B, MD;  Location: [facility] ENDOSCOPY;  Service: Gastroenterology;  Laterality: N/A;   ESOPHAGOGASTRODUODENOSCOPY (EGD) WITH PROPOFOL  N/A 02/01/2018    Procedure: ESOPHAGOGASTRODUODENOSCOPY (EGD) WITH PROPOFOL ;  Surgeon: Janalyn Keene NOVAK, MD;  Location: [facility] ENDOSCOPY;  Service: Endoscopy;  Laterality: N/A;   HERNIA REPAIR       INGUINAL HERNIA REPAIR Bilateral 01/21/2017    Procedure: LAPAROSCOPIC BILATERAL INGUINAL HERNIA REPAIR;  Surgeon: Jordis Laneta FALCON, MD;  Location: [facility] ORS;  Service: General;  Laterality: Bilateral;   KNEE SURGERY Left     SHOULDER ARTHROSCOPY WITH DISTAL CLAVICLE RESECTION Left 07/05/2017    Procedure: LEFT  SHOULDER ARTHROSCOPY, SUBACROMIAL DECOMPRESSION, DISTAL CLAVICLE RESECTION, POSSIBLE MINI OPEN ROTATOR CUFF REPAIR;  Surgeon: Anderson Maude ORN, MD;  Location: [facility] OR;  Service: Orthopedics;  Laterality: Left;   SHOULDER CLOSED REDUCTION Left 12/13/2017    Procedure: LEFT SHOULDER MANIPULATION with steroid injection;  Surgeon: Anderson Maude ORN, MD;  Location: [facility] OR;  Service: Orthopedics;  Laterality: Left;   SHOULDER SURGERY Right     UMBILICAL HERNIA REPAIR N/A 01/21/2017    Procedure: HERNIA REPAIR UMBILICAL ADULT;  Surgeon: Jordis Laneta FALCON, MD;  Location: [facility] ORS;  Service: General;  Laterality: N/A;                Patient Active Problem List    Diagnosis Date Noted   History of stroke 06/08/2023   Moderate aortic stenosis 06/08/2023   Bone spur     Hemorrhoids     Hyperlipidemia     Hypertension     Sleep apnea     Essential hypertension 06/04/2023   Diabetes mellitus treated with injections of non-insulin  medication (HCC) 06/04/2023   Anxiety and depression 06/04/2023   Dyslipidemia 06/04/2023   H/O migraine with aura 05/29/2023   Stress reaction 05/29/2023   Encounter for medication refill 05/29/2023   Ocular migraine with status migrainosus 03/04/2023   Vitamin D  deficiency 11/02/2022   History of colonic polyps 08/30/2022   Erythrocytosis 05/07/2022   Hand pain, left 07/09/2020   Lateral epicondylitis, left elbow 07/09/2020   Radial nerve compression, left 07/09/2020   Traumatic ecchymosis of left hand 07/09/2020   Rupture of left long head biceps tendon 01/14/2020   Uncontrolled type 2 diabetes mellitus with hyperglycemia (HCC) 04/18/2018   Benign neoplasm of rectosigmoid junction     Internal hemorrhoids     Residual hemorrhoidal skin tags     Diverticulosis of large intestine without diverticulitis     Stomach irritation     GERD (gastroesophageal reflux disease)     History of diverticulitis 12/26/2017   Onychomycosis 12/26/2017   Adhesive capsulitis of left  shoulder 12/15/2017   Diverticulitis large intestine 09/2017   Impingement syndrome of left shoulder region 07/05/2017   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   Umbilical hernia without obstruction and without gangrene     Bilateral inguinal hernia without obstruction or gangrene     History of tobacco abuse 11/26/2016   Migraines     Rotator cuff tendinitis, left 11/12/2015   Labral tear of shoulder, left, initial encounter 11/12/2015      PCP: Melvin Pao, NP   REFERRING PROVIDER: Tobie Franky SQUIBB, DPM   REFERRING DIAG:  Diagnosis  [phone] (ICD-10-CM) - Posterior tibial tendinitis, left  M76.72 (ICD-10-CM) - Peroneal tendinitis, left  M77.51 (ICD-10-CM) - Capsulitis of right ankle    THERAPY DIAG:  Pain in right ankle and joints of right foot   Ankle joint stiffness, right  Muscle weakness (generalized)   Gait difficulty   Pain in left ankle and joints of left foot   Rationale for Evaluation and Treatment: Rehabilitation   ONSET DATE: chronic   SUBJECTIVE:    SUBJECTIVE STATEMENT: Pt. Reports chronic L ankle/foot pain.  Pt. States pain started 3-4 months ago.  No specific MOI and pt. Received a cortisone injection yesterday to L ankle.  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     PERTINENT HISTORY:         Foot Pain      Pt stated that he is still having some pain     53 y.o. male presents with the above complaint.  Patient presents for follow-up of left posterior tibial tendinitis/peroneal tendinitis here to go over the MRI results still bothers him a little bit.  He has secondary complaint of right ankle pain.  He denies any other acute complaints he would like to discuss treatment options for the ankle pain as well   Orthopedic: Pain on palpation along the course of the posterior tibial tendon pain along the course of the peroneal tendon mild pain on palpation no pain at the Achilles tendon.  No pain at the ATFL ligament    Right ankle pain hurts with ambulation and deep intra-articular ankle pain noted.  No crepitus noted    Patient was evaluated and treated and all questions answered.   Left peroneal tendinitis/posterior tibial tendinitis - All questions and concerns were discussed with the patient in extensive detail - MRI did not show any pathology.  At this time patient would like to try physical therapy with dry needling dry needling prescription with physical therapy was given to the patient   Right ankle capsulitis - Given the amount of pain that is having he will benefit from steroid injection help decrease of inflammatory components of joint pain.  Patient agrees with plan like to proceed with steroid injection -A steroid injection was performed at right ankle using 1% plain Lidocaine  and 10 mg of Kenalog. This was well tolerated.   PAIN:  Are you having pain? Yes: NPRS scale: 2/10 Pain location: L ankle Pain description: sharp pain/ feels like someone is pushing a knuckle into ankle/heel Aggravating factors: increase activity Relieving factors: Topical gel, rest, using ace bandage to L ankle   0/10 pain at best and 10/10 at worst.     PRECAUTIONS: None   RED FLAGS: None      WEIGHT BEARING RESTRICTIONS: No   FALLS:  Has patient fallen in last 6 months? 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     PLOF: Independent   PATIENT GOALS: Decrease L foot/ankle pain with walking/ work-related tasks.    NEXT MD VISIT: 6 weeks   OBJECTIVE:  Note: Objective measures were completed at Evaluation unless otherwise noted.   DIAGNOSTIC FINDINGS: EXAM DESCRIPTION: MR FOOT LEFT WO CONTRAST   CLINICAL HISTORY: Left peroneal tendinitis/posterior tibial tendinitis   COMPARISON: None Available.   TECHNIQUE: MRI of the  foot is performed according to our usual protocol with multiplanar multi sequence imaging.   FINDINGS: No fracture. No erosions. No significant degenerative change. The marrow signal is unremarkable. No joint effusion. Adequate fat in the sinus tarsi.   Moderate subcutaneous edema. There is no significant tendinopathy or tear to the peroneal tendons. The  tendons and ligaments are unremarkable. The musculature is unremarkable.   IMPRESSION: Moderate subcutaneous edema. Correlate for lymphedema versus cellulitis.   The exam is otherwise unremarkable.   Electronically signed by: Reyes Frees MD 01/07/2024 10:13 AM EDT RP   PATIENT SURVEYS:  LEFS: 37 out of 80.     COGNITION: Overall cognitive status: Within functional limits for tasks assessed                         SENSATION: WFL   EDEMA:  No palpable edema noted.    MUSCLE LENGTH: Hamstrings: NT Thomas test: NT   POSTURE: rounded shoulders   PALPATION: (+) L ankle (medial > lateral) tenderness with generalized. palpation    LOWER EXTREMITY ROM:   Active ROM Right eval Left eval  Hip flexion WNL WNL  Hip extension      Hip abduction WNL WNL  Hip adduction      Hip internal rotation      Hip external rotation      Knee flexion WNL WNL  Knee extension WNL WNL  Ankle dorsiflexion 16 deg. 18 deg.  Ankle plantarflexion 23 deg. 15 deg.  Ankle inversion 30 deg. 10 deg. (Pain)  Ankle eversion 28 deg. 12 deg. (Pain)   (Blank rows = not tested)   LOWER EXTREMITY MMT:   MMT Right eval Left eval  Hip flexion 4 (LBP) 4 (LBP)  Hip extension      Hip abduction 4 4  Hip adduction 4- 4-  Hip internal rotation      Hip external rotation      Knee flexion 4 4-  Knee extension 4 (LBP) 4- (LBP)  Ankle dorsiflexion 4+ 4- (pain)  Ankle plantarflexion 4 3+  Ankle inversion      Ankle eversion       (Blank rows = not tested)   LOWER EXTREMITY SPECIAL TESTS:  Assessment of R ankle mobility (pain limited with hand  placement).     FUNCTIONAL TESTS:  5 times sit to stand: 23.2 seconds   GAIT: Distance walked: in clinic Assistive device utilized: None Level of assistance: Complete Independence Comments: Normalized gait with decrease stride length/ increase ankle pain with increase distance walked. Discussed orthotics/ Fleet Feet to assess feet for proper supportive sneakers.                     Left AROM:  Inversion 20 deg. (No pain reported) Eversion: 12 deg. (no pain reported)                                                                         TREATMENT DATE: 03/26/2024   Subjective:  Pt. reports 2/10 left medial arch with no left distal calf or achilles tendon pain at the start of today's session. Pt. states that he is happy with the progress made in the sense that his left medial arch pain is localized and less severe than it was at the start of physical therapy, and that he is no longer having any left achilles/distal calf pain or right foot pain. Pt. Does report feeling more tired than usual today after a late night of driving for work, and that he  woke up with pain in his low back and left UE/shoulder.    Ther Ex.  Supine Isometric Ankle Inversion/Eversion 1x10 (with therapist manual resistance) Supine Isometric Ankle Plantarflexion/Dorsiflexion 1x10 (with therapist manual resistance) Lateral walks with GTB above ankles, 5 passes down and back in // bars Seated Heel Raises with ball squeezes, 2x15  Standing Gastroc/Soleus Stretch 3x20 sec. Standing Single Leg Eccentric Heel Raises, 3x8 SLS on Airexpad with toss against rebounder/medicine ball pushouts, 4x8 bilaterally Sit to Stands with 8# Medicine ball, 3x8  Manual Therapy:  Supine position:  Contract-relax technique into PF Manual Left Gastroc Stretching  IASTM to medial arch region of left ankle (mild medial arch tenderness noted, less than previous sessions)  Grade IV Subtalar Mobilizations all directions  Grade I-II A/P Talocrural  Mobilizations  Passive Great Toe Extension (no increased pain on this day) Grade III-IV Talocrural Distraction     PATIENT EDUCATION:  Education details: [employer]et/ L ankle isometrics (all planes).   Person educated: Patient Education method: Explanation, Demonstration, and Handouts Education comprehension: verbalized understanding and returned demonstration   HOME EXERCISE PROGRAM: Access Code: [email or web address] URL: [email or web address] Date: 02/22/2024 Prepared by: Ozell Sero   Exercises - Standing Gastroc Stretch  - 1 x daily - 7 x weekly - 3 reps - 30 hold - Long Sitting Isometric Ankle Eversion in Dorsiflexion with Ball at Wall  - 1 x daily - 7 x weekly - 3 sets - 8 reps - 5 hold - Long Sitting Isometric Ankle Inversion in Plantar Flexion with Ball at Wall  - 1 x daily - 7 x weekly - 3 sets - 8 reps - 5 hold - Long Sitting Isometric Ankle Plantarflexion with Ball at Wall  - 1 x daily - 7 x weekly - 3 sets - 8 reps - Towel Scrunches  - 1 x daily - 7 x weekly - 1 sets - 10 reps  Access Code: [email or web address] URL: [email or web address] Date: 02/29/2024 Prepared by: Ozell Sero Exercises - Standing Gastroc Stretch - 1 x daily - 7 x weekly - 3 reps - 30 hold - Soleus Stretch on Wall - 1 x daily - 7 x weekly - 3 sets - 30 hold - Long Sitting Isometric Ankle Eversion in Dorsiflexion with Ball at Wall - 1 x daily - 7 x weekly - 3 sets - 8 reps - 5 hold - Long Sitting Isometric Ankle Inversion in Plantar Flexion with Ball at Wall - 1 x daily - 7 x weekly - 3 sets - 8 reps - 5 hold - Towel Scrunches - 1 x daily - 7 x weekly - 2 sets - 12 reps - Seated Calf Raise With Small Ball at Heels - 1 x daily - 7 x weekly - 3 sets - 8 reps - Standing Heel Raises - 1 x daily - 7 x weekly - 3 sets - 8 reps    ASSESSMENT:   CLINICAL IMPRESSION: Focus of today's session was to progress gross bilateral LE strength while increasing endurance capacity of ankle in SLS positions. Pt. was  introduced to sit to stands while holding 8# medicine ball and lateral walks with GTB above ankles in // barswhich he was able to complete without significant difficulty or increase in ankle/foot pain. Pt. Did experience an increase in bilateral knee pain during sit to stands but was able to complete the activity. Pt. Did note bilateral hip fatigue with lateral walks with GTB and during SLS on foam with ball toss against rebounder. Pt. Was  unable to complete more than one pass against rebounder due to fear of falling secondary to strength deficits in bilateral hip musculature so activity was modified to performing pushouts with balls outside his BOS. Pt. Did report an increase in medial arch pain to a 4/10 at end of tx. Session. Pt. will continue to benefit from skilled physical therapy to address current deficits and maximize functional outcomes.    OBJECTIVE IMPAIRMENTS: Abnormal gait, decreased activity tolerance, decreased balance, decreased mobility, difficulty walking, decreased ROM, decreased strength, hypomobility, improper body mechanics, postural dysfunction, and pain.    ACTIVITY LIMITATIONS: lifting, squatting, stairs, and locomotion level   PARTICIPATION LIMITATIONS: community activity, occupation, and yard work   PERSONAL FACTORS: Fitness and Past/current experiences are also affecting patient's functional outcome.    REHAB POTENTIAL: Good   CLINICAL DECISION MAKING: Evolving/moderate complexity   EVALUATION COMPLEXITY: Moderate     GOALS: Goals reviewed with patient? Yes   SHORT TERM GOALS: Target date: 03/14/24 Pt. Independent with HEP to increase L ankle AROM to WNL as compared to R ankle to improve pain-free mobility.   Baseline:  see above Goal status: Goal met   2.  Pt. Will report no palpable pain along L medial/lateral aspects of ankle to improve pain-free mobility.   Baseline:  (+) tenderness noted.  Goal status: Partially met     LONG TERM GOALS: Target date:  04/04/24   Pt. Will increase LEFS to > 60 out of 80 to improve pain-free mobility with work-related tasks.   Baseline: initial 37 out of 80.   Goal status: INITIAL   2.  Pt. Able to ambulate with normalized gait pattern with proper fitting shoes with on c/o ankle/foot pain to improve pain-free mobility with work-related tasks.  Baseline:  Goal status: INITIAL   3.  Pt. Able to push lawnmower with no c/o ankle/foot pain to improve pain-free mobility.   Baseline: ankle pain with work-related tasks.  Goal status: INITIAL     PLAN:   PT FREQUENCY: 2x/week     PT DURATION: 6 weeks   PLANNED INTERVENTIONS: 97750- Physical Performance Testing, 97110-Therapeutic exercises, 97530- Therapeutic activity, [id number]- Neuromuscular re-education, 97535- Self Care, 02859- Manual therapy, [phone]- Gait training, [phone]- Electrical stimulation (unattended), [phone]- Electrical stimulation (manual), 20560 (1-2 muscles), 20561 (3+ muscles)- Dry Needling, Patient/Family education, Balance training, Stair training, Taping, Joint mobilization, Cryotherapy, and Moist heat   PLAN FOR NEXT SESSION: Continue activities to increase gross LE strength/endurance (progressions with SLS on foam).   10th visit progress note (CHECK GOALS).       Curtistine Bracket, SPT  Ozell JAYSON Sero, PT, DPT # [phone] 03/26/2024

## 2024-03-28 ENCOUNTER — Ambulatory Visit: Admitting: Physical Therapy

## 2024-03-28 DIAGNOSIS — R269 Unspecified abnormalities of gait and mobility: Secondary | ICD-10-CM

## 2024-03-28 DIAGNOSIS — M6281 Muscle weakness (generalized): Secondary | ICD-10-CM

## 2024-03-28 DIAGNOSIS — M25671 Stiffness of right ankle, not elsewhere classified: Secondary | ICD-10-CM

## 2024-03-28 DIAGNOSIS — M25572 Pain in left ankle and joints of left foot: Secondary | ICD-10-CM

## 2024-03-28 NOTE — Progress Notes (Addendum)
 OUTPATIENT PHYSICAL THERAPY LOWER EXTREMITY TREATMENT Physical Therapy Progress Note  Dates of reporting period  02/22/24   to   03/28/24    Patient Name: Keith Barber MRN: [id number] DOB:10-21-1970, 53 y.o., male Today's Date: 03/28/2024   END OF SESSION:  PT End of Session - 03/28/24 0817     Visit Number 10    Number of Visits 12    Date for PT Re-Evaluation 04/04/24    PT Start Time 0817    PT Stop Time 0901    PT Time Calculation (min) 44 min    Activity Tolerance Patient tolerated treatment well;Patient limited by fatigue;Patient limited by pain    Behavior During Therapy [facility] for tasks assessed/performed               Past Medical History:  Diagnosis Date   Acute CVA (cerebrovascular accident) (HCC) 06/04/2023   Adhesive capsulitis of left shoulder 12/15/2017   Anxiety and depression 06/04/2023   Benign neoplasm of rectosigmoid junction     Bilateral inguinal hernia without obstruction or gangrene     Bone spur      LEFT SHOULDER THAT MAKES HIS LEFT ARM TINGLE - had removed   Diverticulitis large intestine 09/2017   Diverticulosis of large intestine without diverticulitis     Dyslipidemia 06/04/2023   Encounter for medication refill 05/29/2023   Erythrocytosis 05/07/2022   Essential hypertension 06/04/2023   GERD (gastroesophageal reflux disease)     H/O migraine with aura 05/29/2023   Hand pain, left 07/09/2020   Hemorrhoids     History of colonic polyps 08/30/2022   History of diverticulitis 12/26/2017   History of tobacco abuse 11/26/2016   Hyperlipidemia     Hyperlipidemia associated with type 2 diabetes mellitus (HCC)     Hypertension     Hypertension associated with diabetes (HCC)     Impingement syndrome of left shoulder region 07/05/2017   Internal hemorrhoids     Labral tear of shoulder, left, initial encounter 11/12/2015   Lateral epicondylitis, left elbow 07/09/2020   Migraines      rare migraines   Ocular migraine with status migrainosus  03/04/2023   Onychomycosis 12/26/2017   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   Radial nerve compression, left 07/09/2020   Residual hemorrhoidal skin tags     Rotator cuff tendinitis, left 11/12/2015   Rupture of left long head biceps tendon 01/14/2020   Sleep apnea     Stomach irritation     Stress reaction 05/29/2023   Traumatic ecchymosis of left hand 07/09/2020   Type 2 diabetes mellitus without complications (HCC) 06/04/2023   Umbilical hernia without obstruction and without gangrene     Uncontrolled type 2 diabetes mellitus with hyperglycemia (HCC) 04/18/2018   Vitamin D  deficiency 11/02/2022             Past Surgical History:  Procedure Laterality Date   24 HOUR PH STUDY N/A 05/30/2018    Procedure: 24 HOUR PH STUDY;  Surgeon: Janalyn Keene NOVAK, MD;  Location: [facility] ENDOSCOPY;  Service: Gastroenterology;  Laterality: N/A;   bicep tendon tear        COLONOSCOPY WITH PROPOFOL  N/A 02/01/2018    Procedure: COLONOSCOPY WITH PROPOFOL ;  Surgeon: Janalyn Keene NOVAK, MD;  Location: [facility]COPY;  Service: Endoscopy;  Laterality: N/A;   COLONOSCOPY WITH PROPOFOL  N/A 08/30/2022    Procedure: COLONOSCOPY WITH PROPOFOL ;  Surgeon: Therisa Bi, MD;  Location: [facility] ENDOSCOPY;  Service: Gastroenterology;  Laterality: N/A;   ESOPHAGEAL  MANOMETRY N/A 05/30/2018    Procedure: ESOPHAGEAL MANOMETRY (EM);  Surgeon: Janalyn Keene NOVAK, MD;  Location: [facility] END[facility];  Service: Gastroenterology;  Laterality: N/A;   ESOPHAGOGASTRODUODENOSCOPY (EGD) WITH PROPOFOL  N/A 02/01/2018    Procedure: ESOPHAGOGASTRODUODENOSCOPY (EGD) WITH PROPOFOL ;  Surgeon: Janalyn Keene NOVAK, MD;  Location: [facility];  Service: Endoscopy;  Laterality: N/A;   HERNIA REPAIR       INGUINAL HERNIA REPAIR Bilateral 01/21/2017    Procedure: LAPAROSCOPIC BILATERAL INGUINAL HERNIA REPAIR;  Surgeon: Jordis Laneta FALCON, MD;  Location: [facility] ORS;  Service: General;  Laterality: Bilateral;   KNEE  SURGERY Left     SHOULDER ARTHROSCOPY WITH DISTAL CLAVICLE RESECTION Left 07/05/2017    Procedure: LEFT SHOULDER ARTHROSCOPY, SUBACROMIAL DECOMPRESSION, DISTAL CLAVICLE RESECTION, POSSIBLE MINI OPEN ROTATOR CUFF REPAIR;  Surgeon: Anderson Maude ORN, MD;  Location: [facility] OR;  Service: Orthopedics;  Laterality: Left;   SHOULDER CLOSED REDUCTION Left 12/13/2017    Procedure: LEFT SHOULDER MANIPULATION with steroid injection;  Surgeon: Anderson Maude ORN, MD;  Location: [facility] OR;  Service: Orthopedics;  Laterality: Left;   SHOULDER SURGERY Right     UMBILICAL HERNIA REPAIR N/A 01/21/2017    Procedure: HERNIA REPAIR UMBILICAL ADULT;  Surgeon: Jordis Laneta FALCON, MD;  Location: [facility] ORS;  Service: General;  Laterality: N/A;            Patient Active Problem List    Diagnosis Date Noted   History of stroke 06/08/2023   Moderate aortic stenosis 06/08/2023   Bone spur     Hemorrhoids     Hyperlipidemia     Hypertension     Sleep apnea     Essential hypertension 06/04/2023   Diabetes mellitus treated with injections of non-insulin  medication (HCC) 06/04/2023   Anxiety and depression 06/04/2023   Dyslipidemia 06/04/2023   H/O migraine with aura 05/29/2023   Stress reaction 05/29/2023   Encounter for medication refill 05/29/2023   Ocular migraine with status migrainosus 03/04/2023   Vitamin D  deficiency 11/02/2022   History of colonic polyps 08/30/2022   Erythrocytosis 05/07/2022   Hand pain, left 07/09/2020   Lateral epicondylitis, left elbow 07/09/2020   Radial nerve compression, left 07/09/2020   Traumatic ecchymosis of left hand 07/09/2020   Rupture of left long head biceps tendon 01/14/2020   Uncontrolled type 2 diabetes mellitus with hyperglycemia (HCC) 04/18/2018   Benign neoplasm of rectosigmoid junction     Internal hemorrhoids     Residual hemorrhoidal skin tags     Diverticulosis of large intestine without diverticulitis     Stomach irritation     GERD (gastroesophageal reflux  disease)     History of diverticulitis 12/26/2017   Onychomycosis 12/26/2017   Adhesive capsulitis of left shoulder 12/15/2017   Diverticulitis large intestine 09/2017   Impingement syndrome of left shoulder region 07/05/2017   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   Umbilical hernia without obstruction and without gangrene     Bilateral inguinal hernia without obstruction or gangrene     History of tobacco abuse 11/26/2016   Migraines     Rotator cuff tendinitis, left 11/12/2015   Labral tear of shoulder, left, initial encounter 11/12/2015    PCP: Melvin Pao, NP   REFERRING PROVIDER: Tobie Franky SQUIBB, DPM   REFERRING DIAG:  Diagnosis  [phone] (ICD-10-CM) - Posterior tibial tendinitis, left  M76.72 (ICD-10-CM) - Peroneal tendinitis, left  M77.51 (ICD-10-CM) - Capsulitis of right ankle    THERAPY DIAG:  Pain in right ankle and joints of right  foot   Ankle joint stiffness, right   Muscle weakness (generalized)   Gait difficulty   Pain in left ankle and joints of left foot   Rationale for Evaluation and Treatment: Rehabilitation   ONSET DATE: chronic   SUBJECTIVE:    SUBJECTIVE STATEMENT: Pt. Reports chronic L ankle/foot pain.  Pt. States pain started 3-4 months ago.  No specific MOI and pt. Received a cortisone injection yesterday to L ankle.  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     PERTINENT HISTORY:      Foot Pain      Pt stated that he is still having some pain     53 y.o. male presents with the above complaint.  Patient presents for follow-up of left posterior tibial tendinitis/peroneal tendinitis here to go over the MRI results still bothers him a little bit.  He has secondary complaint of right ankle pain.  He denies any other acute complaints he would like to discuss treatment options for the ankle pain as well   Orthopedic: Pain on palpation along the course of the posterior tibial tendon pain along the course of the  peroneal tendon mild pain on palpation no pain at the Achilles tendon.  No pain at the ATFL ligament   Right ankle pain hurts with ambulation and deep intra-articular ankle pain noted.  No crepitus noted    Patient was evaluated and treated and all questions answered.   Left peroneal tendinitis/posterior tibial tendinitis - All questions and concerns were discussed with the patient in extensive detail - MRI did not show any pathology.  At this time patient would like to try physical therapy with dry needling dry needling prescription with physical therapy was given to the patient   Right ankle capsulitis - Given the amount of pain that is having he will benefit from steroid injection help decrease of inflammatory components of joint pain.  Patient agrees with plan like to proceed with steroid injection -A steroid injection was performed at right ankle using 1% plain Lidocaine  and 10 mg of Kenalog. This was well tolerated.   PAIN:  Are you having pain? Yes: NPRS scale: 2/10 Pain location: L ankle Pain description: sharp pain/ feels like someone is pushing a knuckle into ankle/heel Aggravating factors: increase activity Relieving factors: Topical gel, rest, using ace bandage to L ankle   0/10 pain at best and 10/10 at worst.     PRECAUTIONS: None   RED FLAGS: None      WEIGHT BEARING RESTRICTIONS: No   FALLS:  Has patient fallen in last 6 months? 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     PLOF: Independent   PATIENT GOALS: Decrease L foot/ankle pain with walking/ work-related tasks.    NEXT MD VISIT: 6 weeks   OBJECTIVE:  Note: Objective measures were completed at Evaluation unless otherwise noted.   DIAGNOSTIC FINDINGS: EXAM DESCRIPTION: MR FOOT LEFT WO CONTRAST   CLINICAL HISTORY: Left  peroneal tendinitis/posterior tibial tendinitis   COMPARISON: None Available.   TECHNIQUE: MRI of the foot is performed according to our usual protocol with multiplanar multi sequence imaging.   FINDINGS: No fracture. No erosions. No significant degenerative change. The marrow signal is unremarkable. No joint effusion. Adequate fat in the sinus tarsi.   Moderate subcutaneous edema. There is no significant tendinopathy or  tear to the peroneal tendons. The tendons and ligaments are unremarkable. The musculature is unremarkable.   IMPRESSION: Moderate subcutaneous edema. Correlate for lymphedema versus cellulitis.   The exam is otherwise unremarkable.   Electronically signed by: Reyes Frees MD 01/07/2024 10:13 AM EDT RP   PATIENT SURVEYS:  LEFS: 37 out of 80.     COGNITION: Overall cognitive status: Within functional limits for tasks assessed                         SENSATION: WFL   EDEMA:  No palpable edema noted.    MUSCLE LENGTH: Hamstrings: NT Thomas test: NT   POSTURE: rounded shoulders   PALPATION: (+) L ankle (medial > lateral) tenderness with generalized. palpation    LOWER EXTREMITY ROM:   Active ROM Right eval Left eval  Hip flexion WNL WNL  Hip extension      Hip abduction WNL WNL  Hip adduction      Hip internal rotation      Hip external rotation      Knee flexion WNL WNL  Knee extension WNL WNL  Ankle dorsiflexion 16 deg. 18 deg.  Ankle plantarflexion 23 deg. 15 deg.  Ankle inversion 30 deg. 10 deg. (Pain)  Ankle eversion 28 deg. 12 deg. (Pain)   (Blank rows = not tested)   LOWER EXTREMITY MMT:   MMT Right eval Left eval  Hip flexion 4 (LBP) 4 (LBP)  Hip extension      Hip abduction 4 4  Hip adduction 4- 4-  Hip internal rotation      Hip external rotation      Knee flexion 4 4-  Knee extension 4 (LBP) 4- (LBP)  Ankle dorsiflexion 4+ 4- (pain)  Ankle plantarflexion 4 3+  Ankle inversion      Ankle eversion       (Blank rows =  not tested)   LOWER EXTREMITY SPECIAL TESTS:  Assessment of R ankle mobility (pain limited with hand placement).     FUNCTIONAL TESTS:  5 times sit to stand: TBD   GAIT: Distance walked: in clinic Assistive device utilized: None Level of assistance: Complete Independence Comments: Normalized gait with decrease stride length/ increase ankle pain with increase distance walked. Discussed orthotics/ [employer]et to assess feet for proper supportive sneakers.   5 time sit to stand time: 23.2 seconds                                                                                           TREATMENT DATE: 03/28/2024   Subjective:  Pt. reports 0/10 left achilles tendon pain at the start of today's session and a mild 2/10 NPS posterior region of the left medial arch. Pt. Reports feeling a little off today which he attributes to being tired from doing 3 lawns yesterday, one of which took a long time and had inclines. Pt. Does note that he did not experience pain in his left foot until the very end of his 6 hour work day which he is happy about.   Ther Act. Long Sitting Isometric Ankle Inversion/Eversion 2x10 Long  Sitting Isometric Ankle Plantarflexion 2x10 Seated Heel Raises with ball squeezes, 2x15 Seated Eccentric Heel Raises off step 3x8  Standing Gastroc/Soleus Stretch 3x30 sec. Lateral walking with Blue TB around ankles in // bars, 4x down and back  TRX lateral lunges, 5 x to each side Sit to stands with 10# medicine ball, 1 x10 SLS on AirexPad with 6# medicine ball push outs/ups, 5 x 20 seconds bilaterally  Manual Therapy:  Supine position Manual Left Gastroc Stretching  IASTM to medial arch region of left ankle (with and without passive great toe extension) Contract-relax technique into DF of left ankle Grade III-IV Subtalar Mobilizations all directions  Grade III-IV A/P and distraction Talocrural Mobilizations     PATIENT EDUCATION:  Education details: [employer]/ L ankle  isometrics (all planes).   Person educated: Patient Education method: Explanation, Demonstration, and Handouts Education comprehension: verbalized understanding and returned demonstration   HOME EXERCISE PROGRAM: Access Code: [email or web address] URL: [email or web address] Date: 02/22/2024 Prepared by: Ozell Sero   Exercises - Standing Gastroc Stretch  - 1 x daily - 7 x weekly - 3 reps - 30 hold - Long Sitting Isometric Ankle Eversion in Dorsiflexion with Ball at Wall  - 1 x daily - 7 x weekly - 3 sets - 8 reps - 5 hold - Long Sitting Isometric Ankle Inversion in Plantar Flexion with Ball at Wall  - 1 x daily - 7 x weekly - 3 sets - 8 reps - 5 hold - Long Sitting Isometric Ankle Plantarflexion with Ball at Wall  - 1 x daily - 7 x weekly - 3 sets - 8 reps - Towel Scrunches  - 1 x daily - 7 x weekly - 1 sets - 10 reps  Access Code: [email or web address] URL: [email or web address] Date: 03/23/2024 Prepared by: Ozell Sero Exercises - Standing Gastroc Stretch - 1 x daily - 7 x weekly - 3 reps - 30 hold - Soleus Stretch on Wall - 1 x daily - 7 x weekly - 3 sets - 30 hold - Long Sitting Isometric Ankle Inversion in Plantar Flexion with Ball at Wall - 1 x daily - 4-5 x weekly - 3 sets - 8 reps - 5 hold - Towel Scrunches - 1 x daily - 4-5 x weekly - 2 sets - 12 reps - Seated Calf Raise With Small Ball at Heels - 1 x daily - 4-5 x weekly - 2 sets - 15 reps - Standing Eccentric Heel Raise - 1 x daily - 4-5 x weekly - 3 sets - 8 reps - Supine Bridge - 1 x daily - 5 x weekly - 3 sets - 10 reps - Sit to Stand Without Arm Support - 1 x daily - 5 x weekly - 3 sets - 10 reps    ASSESSMENT:   CLINICAL IMPRESSION: Pt. Presenting to physical therapy with minimal pain in posterior aspect of left medial arch which is recently only irritating him at the end of a long work day. Focus of today's tx. Session was increasing endurance capacity. Pt. Completed SLS on AirexPad with medicine ball push up and outs  for increased sets and time on this day which he was able to complete with a mild increase in left medial arch pain which dissipated after the completion of the exercise. Pt. Reported stiffness/pain in left calf during eccentric calf raises which also dissipated after completion of the exercise. Pt. Was introduced to TRX lateral lunges to increase gross LE strength/endurance in multidirectional planes of motion. Pt. will continue  to benefit from skilled physical therapy to address current deficits and maximize functional outcomes.    OBJECTIVE IMPAIRMENTS: Abnormal gait, decreased activity tolerance, decreased balance, decreased mobility, difficulty walking, decreased ROM, decreased strength, hypomobility, improper body mechanics, postural dysfunction, and pain.    ACTIVITY LIMITATIONS: lifting, squatting, stairs, and locomotion level   PARTICIPATION LIMITATIONS: community activity, occupation, and yard work   PERSONAL FACTORS: Fitness and Past/current experiences are also affecting patient's functional outcome.    REHAB POTENTIAL: Good   CLINICAL DECISION MAKING: Evolving/moderate complexity   EVALUATION COMPLEXITY: Moderate     GOALS: Goals reviewed with patient? Yes   SHORT TERM GOALS: Target date: 03/14/24 Pt. Independent with HEP to increase L ankle AROM to WNL as compared to R ankle to improve pain-free mobility.   Baseline:  see above Goal status: Partially met   2.  Pt. Will report no palpable pain along L medial/lateral aspects of ankle to improve pain-free mobility.   Baseline:  (+) tenderness noted.  Goal status: Not met     LONG TERM GOALS: Target date: 04/04/24   Pt. Will increase LEFS to > 60 out of 80 to improve pain-free mobility with work-related tasks.   Baseline: initial 37 out of 80.   Goal status: INITIAL   2.  Pt. Able to ambulate with normalized gait pattern with proper fitting shoes with on c/o ankle/foot pain to improve pain-free mobility with work-related  tasks.  Baseline:  Goal status: INITIAL   3.  Pt. Able to push lawnmower with no c/o ankle/foot pain to improve pain-free mobility.   Baseline: ankle pain with work-related tasks.  Goal status: INITIAL     PLAN:   PT FREQUENCY: 2x/week     PT DURATION: 6 weeks   PLANNED INTERVENTIONS: 97750- Physical Performance Testing, 97110-Therapeutic exercises, 97530- Therapeutic activity, [id number]- Neuromuscular re-education, 97535- Self Care, [id number]- Manual therapy, [id number]- Gait training, [phone]- Electrical stimulation (unattended), [phone]- Electrical stimulation (manual), 20560 (1-2 muscles), 20561 (3+ muscles)- Dry Needling, Patient/Family education, Balance training, Stair training, Taping, Joint mobilization, Cryotherapy, and Moist heat   PLAN FOR NEXT SESSION: Continue to challenge LE strength/endurance with functional activities on Airexpad to tolerance.   CHECK GOALS   Curtistine Bracket, SPT  Ozell JAYSON Sero, PT, DPT # [id number] 03/28/2024

## 2024-03-29 ENCOUNTER — Other Ambulatory Visit: Payer: Self-pay | Admitting: Nurse Practitioner

## 2024-03-30 NOTE — Telephone Encounter (Signed)
 Requested medication (s) are due for refill today:   Provider to review  Requested medication (s) are on the active medication list:   Yes  Future visit scheduled:   Yes 10/30   Last ordered: 02/14/2024 #30, 0 refills  Non delegated refill    Requested Prescriptions  Pending Prescriptions Disp Refills   cyclobenzaprine  (FLEXERIL ) 10 MG tablet [Pharmacy Med Name: Cyclobenzaprine  HCl 10 MG Oral Tablet] 30 tablet 0    Sig: Take 1 tablet by mouth three times daily as needed for muscle spasm     Not Delegated - Analgesics:  Muscle Relaxants Failed - 03/30/2024  1:22 PM      Failed - This refill cannot be delegated      Passed - Valid encounter within last 6 months    Recent Outpatient Visits           1 month ago Uncontrolled type 2 diabetes mellitus with hyperglycemia [facility])   [place] [facility] Melvin Pao, NP   6 months ago Migraine with aura and without status migrainosus, not intractable   [place] [facility] Melvin Pao, NP   6 months ago Migraine with aura and without status migrainosus, not intractable   [place] [facility] Melvin Pao, NP

## 2024-04-02 ENCOUNTER — Ambulatory Visit: Admitting: Physical Therapy

## 2024-04-02 ENCOUNTER — Encounter: Payer: Self-pay | Admitting: Physical Therapy

## 2024-04-02 DIAGNOSIS — R269 Unspecified abnormalities of gait and mobility: Secondary | ICD-10-CM

## 2024-04-02 DIAGNOSIS — M25572 Pain in left ankle and joints of left foot: Secondary | ICD-10-CM | POA: Diagnosis not present

## 2024-04-02 DIAGNOSIS — M6281 Muscle weakness (generalized): Secondary | ICD-10-CM

## 2024-04-02 NOTE — Progress Notes (Signed)
 OUTPATIENT PHYSICAL THERAPY LOWER EXTREMITY TREATMENT   Patient Name: Keith Barber MRN: [id number] DOB:08/28/1970, 53 y.o., male Today's Date: 04/02/2024   END OF SESSION:  PT End of Session - 04/02/24 0824     Visit Number 11    Number of Visits 12    Date for PT Re-Evaluation 04/04/24    PT Start Time 0824    PT Stop Time 0908    PT Time Calculation (min) 44 min    Activity Tolerance Patient tolerated treatment well;Patient limited by fatigue;Patient limited by pain    Behavior During Therapy [facility] for tasks assessed/performed               Past Medical History:  Diagnosis Date   Acute CVA (cerebrovascular accident) (HCC) 06/04/2023   Adhesive capsulitis of left shoulder 12/15/2017   Anxiety and depression 06/04/2023   Benign neoplasm of rectosigmoid junction     Bilateral inguinal hernia without obstruction or gangrene     Bone spur      LEFT SHOULDER THAT MAKES HIS LEFT ARM TINGLE - had removed   Diverticulitis large intestine 09/2017   Diverticulosis of large intestine without diverticulitis     Dyslipidemia 06/04/2023   Encounter for medication refill 05/29/2023   Erythrocytosis 05/07/2022   Essential hypertension 06/04/2023   GERD (gastroesophageal reflux disease)     H/O migraine with aura 05/29/2023   Hand pain, left 07/09/2020   Hemorrhoids     History of colonic polyps 08/30/2022   History of diverticulitis 12/26/2017   History of tobacco abuse 11/26/2016   Hyperlipidemia     Hyperlipidemia associated with type 2 diabetes mellitus (HCC)     Hypertension     Hypertension associated with diabetes (HCC)     Impingement syndrome of left shoulder region 07/05/2017   Internal hemorrhoids     Labral tear of shoulder, left, initial encounter 11/12/2015   Lateral epicondylitis, left elbow 07/09/2020   Migraines      rare migraines   Ocular migraine with status migrainosus 03/04/2023   Onychomycosis 12/26/2017   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/2017   Radial nerve compression, left 07/09/2020   Residual hemorrhoidal skin tags     Rotator cuff tendinitis, left 11/12/2015   Rupture of left long head biceps tendon 01/14/2020   Sleep apnea     Stomach irritation     Stress reaction 05/29/2023   Traumatic ecchymosis of left hand 07/09/2020   Type 2 diabetes mellitus without complications (HCC) 06/04/2023   Umbilical hernia without obstruction and without gangrene     Uncontrolled type 2 diabetes mellitus with hyperglycemia (HCC) 04/18/2018   Vitamin D  deficiency 11/02/2022             Past Surgical History:  Procedure Laterality Date   24 HOUR PH STUDY N/A 05/30/2018    Procedure: 24 HOUR PH STUDY;  Surgeon: Janalyn Keene NOVAK, MD;  Location: [facility] ENDOSCOPY;  Service: Gastroenterology;  Laterality: N/A;   bicep tendon tear        COLONOSCOPY WITH PROPOFOL  N/A 02/01/2018    Procedure: COLONOSCOPY WITH PROPOFOL ;  Surgeon: Janalyn Keene NOVAK, MD;  Location: [facility] ENDOSCOPY;  Service: Endoscopy;  Laterality: N/A;   COLONOSCOPY WITH PROPOFOL  N/A 08/30/2022    Procedure: COLONOSCOPY WITH PROPOFOL ;  Surgeon: Therisa Bi, MD;  Location: [facility] ENDOSCOPY;  Service: Gastroenterology;  Laterality: N/A;   ESOPHAGEAL MANOMETRY N/A 05/30/2018    Procedure: ESOPHAGEAL MANOMETRY (EM);  Surgeon: Janalyn Keene NOVAK, MD;  Location:  [facility] ENDOSCOPY;  Service: Gastroenterology;  Laterality: N/A;   ESOPHAGOGASTRODUODENOSCOPY (EGD) WITH PROPOFOL  N/A 02/01/2018    Procedure: ESOPHAGOGASTRODUODENOSCOPY (EGD) WITH PROPOFOL ;  Surgeon: Janalyn Keene NOVAK, MD;  Location: [facility] ENDOSCOPY;  Service: Endoscopy;  Laterality: N/A;   HERNIA REPAIR       INGUINAL HERNIA REPAIR Bilateral 01/21/2017    Procedure: LAPAROSCOPIC BILATERAL INGUINAL HERNIA REPAIR;  Surgeon: Jordis Laneta FALCON, MD;  Location: [facility] ORS;  Service: General;  Laterality: Bilateral;   KNEE SURGERY Left     SHOULDER ARTHROSCOPY WITH DISTAL CLAVICLE RESECTION Left  07/05/2017    Procedure: LEFT SHOULDER ARTHROSCOPY, SUBACROMIAL DECOMPRESSION, DISTAL CLAVICLE RESECTION, POSSIBLE MINI OPEN ROTATOR CUFF REPAIR;  Surgeon: Anderson Maude ORN, MD;  Location: [facility] OR;  Service: Orthopedics;  Laterality: Left;   SHOULDER CLOSED REDUCTION Left 12/13/2017    Procedure: LEFT SHOULDER MANIPULATION with steroid injection;  Surgeon: Anderson Maude ORN, MD;  Location: [facility] OR;  Service: Orthopedics;  Laterality: Left;   SHOULDER SURGERY Right     UMBILICAL HERNIA REPAIR N/A 01/21/2017    Procedure: HERNIA REPAIR UMBILICAL ADULT;  Surgeon: Jordis Laneta FALCON, MD;  Location: [facility] ORS;  Service: General;  Laterality: N/A;            Patient Active Problem List    Diagnosis Date Noted   History of stroke 06/08/2023   Moderate aortic stenosis 06/08/2023   Bone spur     Hemorrhoids     Hyperlipidemia     Hypertension     Sleep apnea     Essential hypertension 06/04/2023   Diabetes mellitus treated with injections of non-insulin  medication (HCC) 06/04/2023   Anxiety and depression 06/04/2023   Dyslipidemia 06/04/2023   H/O migraine with aura 05/29/2023   Stress reaction 05/29/2023   Encounter for medication refill 05/29/2023   Ocular migraine with status migrainosus 03/04/2023   Vitamin D  deficiency 11/02/2022   History of colonic polyps 08/30/2022   Erythrocytosis 05/07/2022   Hand pain, left 07/09/2020   Lateral epicondylitis, left elbow 07/09/2020   Radial nerve compression, left 07/09/2020   Traumatic ecchymosis of left hand 07/09/2020   Rupture of left long head biceps tendon 01/14/2020   Uncontrolled type 2 diabetes mellitus with hyperglycemia (HCC) 04/18/2018   Benign neoplasm of rectosigmoid junction     Internal hemorrhoids     Residual hemorrhoidal skin tags     Diverticulosis of large intestine without diverticulitis     Stomach irritation     GERD (gastroesophageal reflux disease)     History of diverticulitis 12/26/2017   Onychomycosis 12/26/2017    Adhesive capsulitis of left shoulder 12/15/2017   Diverticulitis large intestine 09/2017   Impingement syndrome of left shoulder region 07/05/2017   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   Umbilical hernia without obstruction and without gangrene     Bilateral inguinal hernia without obstruction or gangrene     History of tobacco abuse 11/26/2016   Migraines     Rotator cuff tendinitis, left 11/12/2015   Labral tear of shoulder, left, initial encounter 11/12/2015    PCP: Melvin Pao, NP   REFERRING PROVIDER: Tobie Franky SQUIBB, DPM   REFERRING DIAG:  Diagnosis  [phone] (ICD-10-CM) - Posterior tibial tendinitis, left  M76.72 (ICD-10-CM) - Peroneal tendinitis, left  M77.51 (ICD-10-CM) - Capsulitis of right ankle    THERAPY DIAG:  Pain in right ankle and joints of right foot   Ankle joint stiffness, right   Muscle weakness (generalized)   Gait difficulty  Pain in left ankle and joints of left foot   Rationale for Evaluation and Treatment: Rehabilitation   ONSET DATE: chronic   SUBJECTIVE:    SUBJECTIVE STATEMENT: Pt. Reports chronic L ankle/foot pain.  Pt. States pain started 3-4 months ago.  No specific MOI and pt. Received a cortisone injection yesterday to L ankle.  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     PERTINENT HISTORY:      Foot Pain      Pt stated that he is still having some pain     53 y.o. male presents with the above complaint.  Patient presents for follow-up of left posterior tibial tendinitis/peroneal tendinitis here to go over the MRI results still bothers him a little bit.  He has secondary complaint of right ankle pain.  He denies any other acute complaints he would like to discuss treatment options for the ankle pain as well   Orthopedic: Pain on palpation along the course of the posterior tibial tendon pain along the course of the peroneal tendon mild pain on palpation no pain at the Achilles tendon.  No pain at  the ATFL ligament   Right ankle pain hurts with ambulation and deep intra-articular ankle pain noted.  No crepitus noted    Patient was evaluated and treated and all questions answered.   Left peroneal tendinitis/posterior tibial tendinitis - All questions and concerns were discussed with the patient in extensive detail - MRI did not show any pathology.  At this time patient would like to try physical therapy with dry needling dry needling prescription with physical therapy was given to the patient   Right ankle capsulitis - Given the amount of pain that is having he will benefit from steroid injection help decrease of inflammatory components of joint pain.  Patient agrees with plan like to proceed with steroid injection -A steroid injection was performed at right ankle using 1% plain Lidocaine  and 10 mg of Kenalog. This was well tolerated.   PAIN:  Are you having pain? Yes: NPRS scale: 2/10 Pain location: L ankle Pain description: sharp pain/ feels like someone is pushing a knuckle into ankle/heel Aggravating factors: increase activity Relieving factors: Topical gel, rest, using ace bandage to L ankle   0/10 pain at best and 10/10 at worst.     PRECAUTIONS: None   RED FLAGS: None      WEIGHT BEARING RESTRICTIONS: No   FALLS:  Has patient fallen in last 6 months? 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     PLOF: Independent   PATIENT GOALS: Decrease L foot/ankle pain with walking/ work-related tasks.    NEXT MD VISIT: 6 weeks   OBJECTIVE:  Note: Objective measures were completed at Evaluation unless otherwise noted.   DIAGNOSTIC FINDINGS: EXAM DESCRIPTION: MR FOOT LEFT WO CONTRAST   CLINICAL HISTORY: Left peroneal tendinitis/posterior tibial tendinitis   COMPARISON: None Available.    TECHNIQUE: MRI of the foot is performed according to our usual protocol with multiplanar multi sequence imaging.   FINDINGS: No fracture. No erosions. No significant degenerative change. The marrow signal is unremarkable. No joint effusion. Adequate fat in the sinus tarsi.   Moderate subcutaneous edema. There is no significant tendinopathy or tear to the peroneal tendons. The tendons and ligaments are unremarkable. The musculature is unremarkable.   IMPRESSION:  Moderate subcutaneous edema. Correlate for lymphedema versus cellulitis.   The exam is otherwise unremarkable.   Electronically signed by: Reyes Frees MD 01/07/2024 10:13 AM EDT RP   PATIENT SURVEYS:  LEFS: 37 out of 80.     COGNITION: Overall cognitive status: Within functional limits for tasks assessed                         SENSATION: WFL   EDEMA:  No palpable edema noted.    MUSCLE LENGTH: Hamstrings: NT Thomas test: NT   POSTURE: rounded shoulders   PALPATION: (+) L ankle (medial > lateral) tenderness with generalized. palpation    LOWER EXTREMITY ROM:   Active ROM Right eval Left eval  Hip flexion WNL WNL  Hip extension      Hip abduction WNL WNL  Hip adduction      Hip internal rotation      Hip external rotation      Knee flexion WNL WNL  Knee extension WNL WNL  Ankle dorsiflexion 16 deg. 18 deg.  Ankle plantarflexion 23 deg. 15 deg.  Ankle inversion 30 deg. 10 deg. (Pain)  Ankle eversion 28 deg. 12 deg. (Pain)   (Blank rows = not tested)   LOWER EXTREMITY MMT:   MMT Right eval Left eval  Hip flexion 4 (LBP) 4 (LBP)  Hip extension      Hip abduction 4 4  Hip adduction 4- 4-  Hip internal rotation      Hip external rotation      Knee flexion 4 4-  Knee extension 4 (LBP) 4- (LBP)  Ankle dorsiflexion 4+ 4- (pain)  Ankle plantarflexion 4 3+  Ankle inversion      Ankle eversion       (Blank rows = not tested)   LOWER EXTREMITY SPECIAL TESTS:  Assessment of R ankle mobility  (pain limited with hand placement).     FUNCTIONAL TESTS:  5 times sit to stand: TBD   GAIT: Distance walked: in clinic Assistive device utilized: None Level of assistance: Complete Independence Comments: Normalized gait with decrease stride length/ increase ankle pain with increase distance walked. Discussed orthotics/ [employer] to assess feet for proper supportive sneakers.   5 time sit to stand time: 23.2 seconds                                                                                           TREATMENT DATE: 04/02/2024   Subjective:  Pt. reports 0/10 left achilles tendon pain at the start of today's session and a mild 1/10 NPS posterior region of the left medial arch. Pt states that he had an episode yesterday with feelings of extreme heat, sweating, and nausea which he initially thought could have been a mild stroke which he has experienced in the past but symptoms dissipated after laying down in the [facility] for a while. Pt now believes there is a chance the symptoms he felt were related to food illness but he is unsure. Pt has upcoming neurologist appointment next week where he will discuss further.   Ther Act. Long Sitting Isometric Ankle  Inversion/Eversion 2x10 Long Sitting Isometric Ankle Plantarflexion 2x10 Seated Heel Raises with ball squeezes, 2x15 Seated Eccentric Heel Raises off step 3x8  Standing Gastroc/Soleus Stretch 3x30 sec.  Sit to stands with 10# medicine ball, 1 x10 SLS on BOSU ball (blue side), 5 x 15 seconds   Manual Therapy:  Supine position Manual Left Gastroc Stretching  IASTM to medial arch region of left ankle (with and without passive great toe extension) Contract-relax technique into DF of left ankle Grade III-IV Subtalar Mobilizations all directions  Grade III-IV A/P and distraction Talocrural Mobilizations     PATIENT EDUCATION:  Education details: [employer]/ L ankle isometrics (all planes).   Person educated: Patient Education method:  Explanation, Demonstration, and Handouts Education comprehension: verbalized understanding and returned demonstration   HOME EXERCISE PROGRAM: Access Code: [email or web address] URL: [email or web address] Date: 02/22/2024 Prepared by: Ozell Sero   Exercises - Standing Gastroc Stretch  - 1 x daily - 7 x weekly - 3 reps - 30 hold - Long Sitting Isometric Ankle Eversion in Dorsiflexion with Ball at Wall  - 1 x daily - 7 x weekly - 3 sets - 8 reps - 5 hold - Long Sitting Isometric Ankle Inversion in Plantar Flexion with Ball at Wall  - 1 x daily - 7 x weekly - 3 sets - 8 reps - 5 hold - Long Sitting Isometric Ankle Plantarflexion with Ball at Wall  - 1 x daily - 7 x weekly - 3 sets - 8 reps - Towel Scrunches  - 1 x daily - 7 x weekly - 1 sets - 10 reps  Access Code: [email or web address] URL: [email or web address] Date: 03/23/2024 Prepared by: Ozell Sero Exercises - Standing Gastroc Stretch - 1 x daily - 7 x weekly - 3 reps - 30 hold - Soleus Stretch on Wall - 1 x daily - 7 x weekly - 3 sets - 30 hold - Long Sitting Isometric Ankle Inversion in Plantar Flexion with Ball at Wall - 1 x daily - 4-5 x weekly - 3 sets - 8 reps - 5 hold - Towel Scrunches - 1 x daily - 4-5 x weekly - 2 sets - 12 reps - Seated Calf Raise With Small Ball at Heels - 1 x daily - 4-5 x weekly - 2 sets - 15 reps - Standing Eccentric Heel Raise - 1 x daily - 4-5 x weekly - 3 sets - 8 reps - Supine Bridge - 1 x daily - 5 x weekly - 3 sets - 10 reps - Sit to Stand Without Arm Support - 1 x daily - 5 x weekly - 3 sets - 10 reps    ASSESSMENT:   CLINICAL IMPRESSION: Pt presents to physical therapy with mild pain Iin the same spot which is the posterior region of the left medial arch. Pt continues to report that all manual therapy interventions are not pain provoking other than mild, momentary increase in pain with IASTM to medial arch region which dissipated within a few seconds. Pt was progressed to completing SLS on BOSU  ball (blue side) which he was able to complete without significant difficulty and only a mild increase in left foot pain to a 2/10. Pt does report left calf tightness and fatigue after the first two sets of eccentric calf raises off edge of step. Plan for final discharge after next session.    OBJECTIVE IMPAIRMENTS: Abnormal gait, decreased activity tolerance, decreased balance, decreased mobility, difficulty walking, decreased ROM, decreased strength, hypomobility, improper body mechanics, postural dysfunction, and  pain.    ACTIVITY LIMITATIONS: lifting, squatting, stairs, and locomotion level   PARTICIPATION LIMITATIONS: community activity, occupation, and yard work   PERSONAL FACTORS: Fitness and Past/current experiences are also affecting patient's functional outcome.    REHAB POTENTIAL: Good   CLINICAL DECISION MAKING: Evolving/moderate complexity   EVALUATION COMPLEXITY: Moderate     GOALS: Goals reviewed with patient? Yes   SHORT TERM GOALS: Target date: 03/14/24 Pt. Independent with HEP to increase L ankle AROM to WNL as compared to R ankle to improve pain-free mobility.   Baseline:  see above Goal status: Partially met   2.  Pt. Will report no palpable pain along L medial/lateral aspects of ankle to improve pain-free mobility.   Baseline:  (+) tenderness noted.  Goal status: Not met     LONG TERM GOALS: Target date: 04/04/24   Pt. Will increase LEFS to > 60 out of 80 to improve pain-free mobility with work-related tasks.   Baseline: initial 37 out of 80.   Goal status: INITIAL   2.  Pt. Able to ambulate with normalized gait pattern with proper fitting shoes with on c/o ankle/foot pain to improve pain-free mobility with work-related tasks.  Baseline:  Goal status: INITIAL   3.  Pt. Able to push lawnmower with no c/o ankle/foot pain to improve pain-free mobility.   Baseline: ankle pain with work-related tasks.  Goal status: INITIAL     PLAN:   PT FREQUENCY:  2x/week     PT DURATION: 6 weeks   PLANNED INTERVENTIONS: 97750- Physical Performance Testing, 97110-Therapeutic exercises, 97530- Therapeutic activity, [id number]- Neuromuscular re-education, 97535- Self Care, [id number]- Manual therapy, [id number]- Gait training, [phone]- Electrical stimulation (unattended), [phone]- Electrical stimulation (manual), 20560 (1-2 muscles), 20561 (3+ muscles)- Dry Needling, Patient/Family education, Balance training, Stair training, Taping, Joint mobilization, Cryotherapy, and Moist heat   PLAN FOR NEXT SESSION: Plan for final discharge after next session. UPDATE GOALS, issue final HEP.    Curtistine Bracket, SPT  Ozell JAYSON Sero, PT, DPT # [id number] 04/02/2024

## 2024-04-04 ENCOUNTER — Ambulatory Visit: Admitting: Physical Therapy

## 2024-04-05 ENCOUNTER — Ambulatory Visit: Admitting: Physical Therapy

## 2024-04-05 DIAGNOSIS — M25572 Pain in left ankle and joints of left foot: Secondary | ICD-10-CM | POA: Diagnosis not present

## 2024-04-05 DIAGNOSIS — M6281 Muscle weakness (generalized): Secondary | ICD-10-CM

## 2024-04-05 DIAGNOSIS — R269 Unspecified abnormalities of gait and mobility: Secondary | ICD-10-CM

## 2024-04-05 NOTE — Therapy (Signed)
 OUTPATIENT PHYSICAL THERAPY LOWER EXTREMITY TREATMENT RECERTIFICATION/ DISCHARGE   Patient Name: Keith Barber MRN: [id number] DOB:10/22/1970, 53 y.o., male Today's Date: 04/05/2024    PT End of Session - 04/05/24 0734     Visit Number 12    Number of Visits 12    Date for Recertification  04/05/24    PT Start Time 0734    PT Stop Time 0820    PT Time Calculation (min) 46 min    Activity Tolerance Patient tolerated treatment well;Patient limited by fatigue;No increased pain    Behavior During Therapy [facility] for tasks assessed/performed                 Past Medical History:  Diagnosis Date   Acute CVA (cerebrovascular accident) (HCC) 06/04/2023   Adhesive capsulitis of left shoulder 12/15/2017   Anxiety and depression 06/04/2023   Benign neoplasm of rectosigmoid junction     Bilateral inguinal hernia without obstruction or gangrene     Bone spur      LEFT SHOULDER THAT MAKES HIS LEFT ARM TINGLE - had removed   Diverticulitis large intestine 09/2017   Diverticulosis of large intestine without diverticulitis     Dyslipidemia 06/04/2023   Encounter for medication refill 05/29/2023   Erythrocytosis 05/07/2022   Essential hypertension 06/04/2023   GERD (gastroesophageal reflux disease)     H/O migraine with aura 05/29/2023   Hand pain, left 07/09/2020   Hemorrhoids     History of colonic polyps 08/30/2022   History of diverticulitis 12/26/2017   History of tobacco abuse 11/26/2016   Hyperlipidemia     Hyperlipidemia associated with type 2 diabetes mellitus (HCC)     Hypertension     Hypertension associated with diabetes (HCC)     Impingement syndrome of left shoulder region 07/05/2017   Internal hemorrhoids     Labral tear of shoulder, left, initial encounter 11/12/2015   Lateral epicondylitis, left elbow 07/09/2020   Migraines      rare migraines   Ocular migraine with status migrainosus 03/04/2023   Onychomycosis 12/26/2017   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/2017   Radial nerve compression, left 07/09/2020   Residual hemorrhoidal skin tags     Rotator cuff tendinitis, left 11/12/2015   Rupture of left long head biceps tendon 01/14/2020   Sleep apnea     Stomach irritation     Stress reaction 05/29/2023   Traumatic ecchymosis of left hand 07/09/2020   Type 2 diabetes mellitus without complications (HCC) 06/04/2023   Umbilical hernia without obstruction and without gangrene     Uncontrolled type 2 diabetes mellitus with hyperglycemia (HCC) 04/18/2018   Vitamin D  deficiency 11/02/2022                  Past Surgical History:  Procedure Laterality Date   72 HOUR PH STUDY N/A 05/30/2018    Procedure: 24 HOUR PH STUDY;  Surgeon: Janalyn Keene NOVAK, MD;  Location: [facility] ENDOSCOPY;  Service: Gastroenterology;  Laterality: N/A;   bicep tendon tear        COLONOSCOPY WITH PROPOFOL  N/A 02/01/2018    Procedure: COLONOSCOPY WITH PROPOFOL ;  Surgeon: Janalyn Keene NOVAK, MD;  Location: [facility] ENDOSCOPY;  Service: Endoscopy;  Laterality: N/A;   COLONOSCOPY WITH PROPOFOL  N/A 08/30/2022    Procedure: COLONOSCOPY WITH PROPOFOL ;  Surgeon: Therisa Bi, MD;  Location: [facility] ENDOSCOPY;  Service: Gastroenterology;  Laterality: N/A;   ESOPHAGEAL MANOMETRY N/A 05/30/2018    Procedure: ESOPHAGEAL MANOMETRY (EM);  Surgeon: Janalyn,  Keene NOVAK, MD;  Location: [facility] ENDOSCOPY;  Service: Gastroenterology;  Laterality: N/A;   ESOPHAGOGASTRODUODENOSCOPY (EGD) WITH PROPOFOL  N/A 02/01/2018    Procedure: ESOPHAGOGASTRODUODENOSCOPY (EGD) WITH PROPOFOL ;  Surgeon: Janalyn Keene NOVAK, MD;  Location: [facility] ENDOSCOPY;  Service: Endoscopy;  Laterality: N/A;   HERNIA REPAIR       INGUINAL HERNIA REPAIR Bilateral 01/21/2017    Procedure: LAPAROSCOPIC BILATERAL INGUINAL HERNIA REPAIR;  Surgeon: Jordis Laneta FALCON, MD;  Location: [facility] ORS;  Service: General;  Laterality: Bilateral;   KNEE SURGERY Left     SHOULDER ARTHROSCOPY WITH DISTAL CLAVICLE RESECTION  Left 07/05/2017    Procedure: LEFT SHOULDER ARTHROSCOPY, SUBACROMIAL DECOMPRESSION, DISTAL CLAVICLE RESECTION, POSSIBLE MINI OPEN ROTATOR CUFF REPAIR;  Surgeon: Anderson Maude ORN, MD;  Location: [facility] OR;  Service: Orthopedics;  Laterality: Left;   SHOULDER CLOSED REDUCTION Left 12/13/2017    Procedure: LEFT SHOULDER MANIPULATION with steroid injection;  Surgeon: Anderson Maude ORN, MD;  Location: [facility] OR;  Service: Orthopedics;  Laterality: Left;   SHOULDER SURGERY Right     UMBILICAL HERNIA REPAIR N/A 01/21/2017    Procedure: HERNIA REPAIR UMBILICAL ADULT;  Surgeon: Jordis Laneta FALCON, MD;  Location: [facility] ORS;  Service: General;  Laterality: N/A;                Patient Active Problem List    Diagnosis Date Noted   History of stroke 06/08/2023   Moderate aortic stenosis 06/08/2023   Bone spur     Hemorrhoids     Hyperlipidemia     Hypertension     Sleep apnea     Essential hypertension 06/04/2023   Diabetes mellitus treated with injections of non-insulin  medication (HCC) 06/04/2023   Anxiety and depression 06/04/2023   Dyslipidemia 06/04/2023   H/O migraine with aura 05/29/2023   Stress reaction 05/29/2023   Encounter for medication refill 05/29/2023   Ocular migraine with status migrainosus 03/04/2023   Vitamin D  deficiency 11/02/2022   History of colonic polyps 08/30/2022   Erythrocytosis 05/07/2022   Hand pain, left 07/09/2020   Lateral epicondylitis, left elbow 07/09/2020   Radial nerve compression, left 07/09/2020   Traumatic ecchymosis of left hand 07/09/2020   Rupture of left long head biceps tendon 01/14/2020   Uncontrolled type 2 diabetes mellitus with hyperglycemia (HCC) 04/18/2018   Benign neoplasm of rectosigmoid junction     Internal hemorrhoids     Residual hemorrhoidal skin tags     Diverticulosis of large intestine without diverticulitis     Stomach irritation     GERD (gastroesophageal reflux disease)     History of diverticulitis 12/26/2017   Onychomycosis  12/26/2017   Adhesive capsulitis of left shoulder 12/15/2017   Diverticulitis large intestine 09/2017   Impingement syndrome of left shoulder region 07/05/2017   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   Umbilical hernia without obstruction and without gangrene     Bilateral inguinal hernia without obstruction or gangrene     History of tobacco abuse 11/26/2016   Migraines     Rotator cuff tendinitis, left 11/12/2015   Labral tear of shoulder, left, initial encounter 11/12/2015      PCP: Melvin Pao, NP   REFERRING PROVIDER: Tobie Franky SQUIBB, DPM   REFERRING DIAG:  Diagnosis  [phone] (ICD-10-CM) - Posterior tibial tendinitis, left  M76.72 (ICD-10-CM) - Peroneal tendinitis, left  M77.51 (ICD-10-CM) - Capsulitis of right ankle    THERAPY DIAG:  Pain in right ankle and joints of right foot   Ankle joint stiffness, right  Muscle weakness (generalized)   Gait difficulty   Pain in left ankle and joints of left foot   Rationale for Evaluation and Treatment: Rehabilitation   ONSET DATE: chronic   SUBJECTIVE:    SUBJECTIVE STATEMENT: Pt. Reports chronic L ankle/foot pain.  Pt. States pain started 3-4 months ago.  No specific MOI and pt. Received a cortisone injection yesterday to L ankle.  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     PERTINENT HISTORY:         Foot Pain      Pt stated that he is still having some pain     53 y.o. male presents with the above complaint.  Patient presents for follow-up of left posterior tibial tendinitis/peroneal tendinitis here to go over the MRI results still bothers him a little bit.  He has secondary complaint of right ankle pain.  He denies any other acute complaints he would like to discuss treatment options for the ankle pain as well   Orthopedic: Pain on palpation along the course of the posterior tibial tendon pain along the course of the peroneal tendon mild pain on palpation no pain at the Achilles  tendon.  No pain at the ATFL ligament   Right ankle pain hurts with ambulation and deep intra-articular ankle pain noted.  No crepitus noted    Patient was evaluated and treated and all questions answered.   Left peroneal tendinitis/posterior tibial tendinitis - All questions and concerns were discussed with the patient in extensive detail - MRI did not show any pathology.  At this time patient would like to try physical therapy with dry needling dry needling prescription with physical therapy was given to the patient   Right ankle capsulitis - Given the amount of pain that is having he will benefit from steroid injection help decrease of inflammatory components of joint pain.  Patient agrees with plan like to proceed with steroid injection -A steroid injection was performed at right ankle using 1% plain Lidocaine  and 10 mg of Kenalog. This was well tolerated.   PAIN:  Are you having pain? Yes: NPRS scale: 2/10 Pain location: L ankle Pain description: sharp pain/ feels like someone is pushing a knuckle into ankle/heel Aggravating factors: increase activity Relieving factors: Topical gel, rest, using ace bandage to L ankle   0/10 pain at best and 10/10 at worst.     PRECAUTIONS: None   RED FLAGS: None      WEIGHT BEARING RESTRICTIONS: No   FALLS:  Has patient fallen in last 6 months? 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     PLOF: Independent   PATIENT GOALS: Decrease L foot/ankle pain with walking/ work-related tasks.    NEXT MD VISIT: 6 weeks   OBJECTIVE:  Note: Objective measures were completed at Evaluation unless otherwise noted.   DIAGNOSTIC FINDINGS: EXAM DESCRIPTION: MR FOOT LEFT WO CONTRAST   CLINICAL HISTORY: Left peroneal tendinitis/posterior tibial tendinitis   COMPARISON:  None Available.   TECHNIQUE: MRI of the foot is performed according to our usual protocol with multiplanar multi sequence imaging.   FINDINGS: No fracture. No erosions. No significant degenerative change. The marrow signal is unremarkable. No joint effusion. Adequate fat in the sinus tarsi.   Moderate subcutaneous edema. There is no significant tendinopathy or tear to the peroneal tendons. The  tendons and ligaments are unremarkable. The musculature is unremarkable.   IMPRESSION: Moderate subcutaneous edema. Correlate for lymphedema versus cellulitis.   The exam is otherwise unremarkable.   Electronically signed by: Reyes Frees MD 01/07/2024 10:13 AM EDT RP   PATIENT SURVEYS:  LEFS: 37 out of 80.     COGNITION: Overall cognitive status: Within functional limits for tasks assessed                         SENSATION: WFL   EDEMA:  No palpable edema noted.    MUSCLE LENGTH: Hamstrings: NT Thomas test: NT   POSTURE: rounded shoulders   PALPATION: (+) L ankle (medial > lateral) tenderness with generalized. palpation    LOWER EXTREMITY ROM:   Active ROM Right eval Left eval  Hip flexion WNL WNL  Hip extension      Hip abduction WNL WNL  Hip adduction      Hip internal rotation      Hip external rotation      Knee flexion WNL WNL  Knee extension WNL WNL  Ankle dorsiflexion 16 deg. 18 deg.  Ankle plantarflexion 23 deg. 15 deg.  Ankle inversion 30 deg. 10 deg. (Pain)  Ankle eversion 28 deg. 12 deg. (Pain)   (Blank rows = not tested)   LOWER EXTREMITY MMT:   MMT Right eval Left eval  Hip flexion 4 (LBP) 4 (LBP)  Hip extension      Hip abduction 4 4  Hip adduction 4- 4-  Hip internal rotation      Hip external rotation      Knee flexion 4 4-  Knee extension 4 (LBP) 4- (LBP)  Ankle dorsiflexion 4+ 4- (pain)  Ankle plantarflexion 4 3+  Ankle inversion      Ankle eversion       (Blank rows = not tested)   LOWER EXTREMITY SPECIAL TESTS:  Assessment of  R ankle mobility (pain limited with hand placement).     FUNCTIONAL TESTS:  5 times sit to stand: 23.2 seconds   GAIT: Distance walked: in clinic Assistive device utilized: None Level of assistance: Complete Independence Comments: Normalized gait with decrease stride length/ increase ankle pain with increase distance walked. Discussed orthotics/ Fleet Feet to assess feet for proper supportive sneakers.                     Left AROM:  Inversion 20 deg. (No pain reported) Eversion: 12 deg. (no pain reported)                                                                         TREATMENT DATE: 04/05/2024   Subjective:  Pt. reports 2/10 left medial arch with no left distal calf or achilles tendon pain at the start of today's session. Pt. reports feeling more tired than usual today due to increased household and work activities over the past few days but reports that his left foot pain has remained minimal during this.    Ther Ex.  Supine Isometric Ankle Inversion/Eversion 1x10 (with therapist manual resistance) Supine Isometric Ankle Plantarflexion/Dorsiflexion 1x10 (with therapist manual resistance) Standing Gastroc/Soleus Stretch 3x20 sec. Standing Single Leg Eccentric Heel Raises, 3x8 SLS  on firm surface and Airexpad with toss against rebounder, 4x5  Sit to [place] with 10# Medicine ball, 3x8  Manual Therapy:  Supine position:  Contract-relax technique into PF Manual Left Gastroc Stretching  IASTM to medial arch region of left ankle (mild medial arch tenderness noted, less than previous sessions)  Grade IV Subtalar Mobilizations all directions  Grade I-II A/P Talocrural Mobilizations  Passive Great Toe Extension (no increased pain) Grade IV Talocrural Distraction      AROM       LEFT           Ankle dorsiflexion 12 deg.   Ankle plantarflexion 32 deg.   Ankle inversion 30 deg.   Ankle eversion 17 deg.   LEFS:66/80  Issued and updated HEP   PATIENT EDUCATION:  Education  details: [employer]et/ L ankle isometrics (all planes).   Person educated: Patient Education method: Explanation, Demonstration, and Handouts Education comprehension: verbalized understanding and returned demonstration   HOME EXERCISE PROGRAM: Access Code: [email or web address] URL: [email or web address] Date: 02/22/2024 Prepared by: Ozell Sero   Exercises - Standing Gastroc Stretch  - 1 x daily - 7 x weekly - 3 reps - 30 hold - Long Sitting Isometric Ankle Eversion in Dorsiflexion with Ball at Wall  - 1 x daily - 7 x weekly - 3 sets - 8 reps - 5 hold - Long Sitting Isometric Ankle Inversion in Plantar Flexion with Ball at Wall  - 1 x daily - 7 x weekly - 3 sets - 8 reps - 5 hold - Long Sitting Isometric Ankle Plantarflexion with Ball at Wall  - 1 x daily - 7 x weekly - 3 sets - 8 reps - Towel Scrunches  - 1 x daily - 7 x weekly - 1 sets - 10 reps  Access Code: [email or web address] URL: [email or web address] Date: 02/29/2024 Prepared by: Ozell Sero Exercises - Standing Gastroc Stretch - 1 x daily - 7 x weekly - 3 reps - 30 hold - Soleus Stretch on Wall - 1 x daily - 7 x weekly - 3 sets - 30 hold - Long Sitting Isometric Ankle Eversion in Dorsiflexion with Ball at Wall - 1 x daily - 7 x weekly - 3 sets - 8 reps - 5 hold - Long Sitting Isometric Ankle Inversion in Plantar Flexion with Ball at Wall - 1 x daily - 7 x weekly - 3 sets - 8 reps - 5 hold - Towel Scrunches - 1 x daily - 7 x weekly - 2 sets - 12 reps - Seated Calf Raise With Small Ball at Heels - 1 x daily - 7 x weekly - 3 sets - 8 reps - Standing Heel Raises - 1 x daily - 7 x weekly - 3 sets - 8 reps   Access Code: [email or web address] URL: [email or web address] Date: 04/05/2024 Prepared by: Ozell Sero Exercises - Standing Gastroc Stretch - 1 x daily - 7 x weekly - 3 reps - 30 hold - Standing Hamstring Stretch with Step - 1 x daily - 7 x weekly - 2 sets - 10 reps - Long Sitting Isometric Ankle Inversion in Plantar Flexion with  Ball at Wall - 1 x daily - 4-5 x weekly - 3 sets - 8 reps - 5 hold - Standing Eccentric Heel Raise - 1 x daily - 4-5 x weekly - 3 sets - 8 reps - Supine Bridge - 1 x daily - 4-5 x weekly - 3 sets - 10 reps - Sit to Stand Without Arm Support - 1  x daily - 4-5 x weekly - 3 sets - 10 reps - Standing Hip Abduction With Chair: Alternating With Resistance Loop - 1 x daily - 4-5 x weekly - 2 sets - 15 reps    ASSESSMENT:   CLINICAL IMPRESSION: Pt. was progressed to completing sit to stands while holding 10# medicine ball which he was able to complete without significant difficulty or increase in ankle/foot pain. Pt. Continues to experience a mild increase in bilateral knee pain during sit to stands but was able to complete the activity. Pt demonstrated improved ability to complete SLS on firm and foam surfaces and was able to add in ball toss against rebounder on this day. Pt reported no increase in left foot pain at end of tx session on this day.   Pt has attended and participated in 12 physical therapy tx sessions to date and has seen improvements in gross LE strength, mobility of ankle joint, and self perceived improvement in daily function without pain. These improvements have lead to increased ability to ambulate prolonged distances, negotiate stairs, and lift/push heavy objects which is required to do for work without significant left foot/ankle pain. Pt to be discharged from physical therapy services at this time after achieving functional goals and able to work for longer periods of time without an increase in left foot pain.     OBJECTIVE IMPAIRMENTS: Abnormal gait, decreased activity tolerance, decreased balance, decreased mobility, difficulty walking, decreased ROM, decreased strength, hypomobility, improper body mechanics, postural dysfunction, and pain.    ACTIVITY LIMITATIONS: lifting, squatting, stairs, and locomotion level   PARTICIPATION LIMITATIONS: community activity, occupation, and yard  work   PERSONAL FACTORS: Fitness and Past/current experiences are also affecting patient's functional outcome.    REHAB POTENTIAL: Good   CLINICAL DECISION MAKING: Evolving/moderate complexity   EVALUATION COMPLEXITY: Moderate     GOALS: Goals reviewed with patient? Yes   SHORT TERM GOALS: Target date: 03/14/24 Pt. Independent with HEP to increase L ankle AROM to WNL as compared to R ankle to improve pain-free mobility.   Baseline:  see above. 9/18: see above, no pain Goal status: GOAL MET   2.  Pt. Will report no palpable pain along L medial/lateral aspects of ankle to improve pain-free mobility.   Baseline:  (+) tenderness noted. 9/18: no tenderness with palpation to ankle Goal status: GOAL MET     LONG TERM GOALS: Target date: 04/04/24   Pt. Will increase LEFS to > 60 out of 80 to improve pain-free mobility with work-related tasks.   Baseline: initial 37 out of 80. 9/18: 66 out of 80 Goal status: GOAL MET   2.  Pt. Able to ambulate with normalized gait pattern with proper fitting shoes with on c/o ankle/foot pain to improve pain-free mobility with work-related tasks.  Baseline: see above. 9/18: Pt with antalgic gait to left LE which is primarily related to left knee pain Goal status: Partially met   3.  Pt. Able to push lawnmower with no c/o ankle/foot pain to improve pain-free mobility.   Baseline: ankle pain with work-related tasks. 9/18: no left foot pain during/after mowing 3 lawns  Goal status: GOAL MET     PLAN:   PT FREQUENCY: 1 visit     PLANNED INTERVENTIONS: 97750- Physical Performance Testing, 97110-Therapeutic exercises, 97530- Therapeutic activity, [id number]- Neuromuscular re-education, 97535- Self Care, 02859- Manual therapy, [id number]- Gait training, [phone]- Electrical stimulation (unattended), [phone]- Electrical stimulation (manual), 20560 (1-2 muscles), 20561 (3+ muscles)- Dry Needling, Patient/Family education, Balance training,  Stair training, Taping, Joint  mobilization, Cryotherapy, and Moist heat   PLAN FOR NEXT SESSION: Patient to be discharged from physical therapy services at this time.      Curtistine Bracket, SPT  Ozell JAYSON Sero, PT, DPT # [phone] 04/05/2024

## 2024-04-11 DIAGNOSIS — J342 Deviated nasal septum: Secondary | ICD-10-CM | POA: Diagnosis not present

## 2024-04-11 DIAGNOSIS — G4733 Obstructive sleep apnea (adult) (pediatric): Secondary | ICD-10-CM | POA: Diagnosis not present

## 2024-04-14 ENCOUNTER — Other Ambulatory Visit: Payer: Self-pay | Admitting: Nurse Practitioner

## 2024-04-17 NOTE — Telephone Encounter (Signed)
 Requested medication (s) are due for refill today: -  Requested medication (s) are on the active medication list: hx med   Last refill:  02/14/24  Future visit scheduled: yes  Notes to clinic:  hx  provider//Med not assigned to a protocol   Requested Prescriptions  Pending Prescriptions Disp Refills   NURTEC 75 MG TBDP [Pharmacy Med Name: Nurtec 75 MG Oral Tablet Disintegrating] 32 tablet 0    Sig: TAKE 1 TABLET ON OR UNDER THE TONGUE AS NEEDED FOR MIGRAINE     Off-Protocol Failed - 04/17/2024 11:36 AM      Failed - Medication not assigned to a protocol, review manually.      Passed - Valid encounter within last 12 months    Recent Outpatient Visits           2 months ago Uncontrolled type 2 diabetes mellitus with hyperglycemia [facility])   [place] [facility] Melvin Pao, NP   6 months ago Migraine with aura and without status migrainosus, not intractable   [place] [facility] Melvin Pao, NP   7 months ago Migraine with aura and without status migrainosus, not intractable    [facility] Melvin Pao, NP

## 2024-04-30 ENCOUNTER — Encounter: Payer: Self-pay | Admitting: Nurse Practitioner

## 2024-04-30 NOTE — Telephone Encounter (Signed)
 Patient dropped off Pre Operative Medical Clearance Form to be filled out by provider. Patient is requesting a call back at [phone] within 2-5 days when completed. Document is located in providers folder.

## 2024-05-01 ENCOUNTER — Ambulatory Visit: Admitting: Podiatry

## 2024-05-01 DIAGNOSIS — B351 Tinea unguium: Secondary | ICD-10-CM

## 2024-05-01 DIAGNOSIS — G4719 Other hypersomnia: Secondary | ICD-10-CM | POA: Diagnosis not present

## 2024-05-01 NOTE — Progress Notes (Signed)
 Subjective:  Patient ID: Keith Barber, male    DOB: Sep 05, 1970,  MRN: [id number]  Chief Complaint  Patient presents with   Nail Problem    Right first and second nail thick and discolored pt stated that it does cause him some discomfort     53 y.o. male presents with the above complaint.  Patient presents for right hallux and second digit nail dystrophy with painful to touch has progressed gotten worse there is some ingrown that is causing him some discomfort he would like to discuss treatment options he does not want to have it removed today.   Review of Systems: Negative except as noted in the HPI. Denies N/V/F/Ch.  Past Medical History:  Diagnosis Date   Adhesive capsulitis of left shoulder 12/15/2017   Anxiety and depression 06/04/2023   Benign neoplasm of rectosigmoid junction    Bilateral inguinal hernia without obstruction or gangrene    Bone spur    LEFT SHOULDER THAT MAKES HIS LEFT ARM TINGLE - had removed   Diabetes mellitus treated with injections of non-insulin  medication (HCC) 06/04/2023   Diverticulitis large intestine 09/2017   Diverticulosis of large intestine without diverticulitis    Dyslipidemia 06/04/2023   Encounter for medication refill 05/29/2023   Erythrocytosis 05/07/2022   Essential hypertension 06/04/2023   GERD (gastroesophageal reflux disease)    H/O migraine with aura 05/29/2023   Hand pain, left 07/09/2020   Hemorrhoids    History of colonic polyps 08/30/2022   History of diverticulitis 12/26/2017   History of stroke 06/08/2023   History of tobacco abuse 11/26/2016   Hyperlipidemia    Hypertension    Impingement syndrome of left shoulder region 07/05/2017   Internal hemorrhoids    Labral tear of shoulder, left, initial encounter 11/12/2015   Lateral epicondylitis, left elbow 07/09/2020   Migraines    rare migraines   Moderate aortic stenosis 06/08/2023   Ocular migraine with status migrainosus 03/04/2023   Onychomycosis 12/26/2017    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   Radial nerve compression, left 07/09/2020   Residual hemorrhoidal skin tags    Rotator cuff tendinitis, left 11/12/2015   Rupture of left long head biceps tendon 01/14/2020   Sleep apnea    Stomach irritation    Stress reaction 05/29/2023   Traumatic ecchymosis of left hand 07/09/2020   Type 2 diabetes mellitus without complications (HCC) 06/04/2023   Umbilical hernia without obstruction and without gangrene    Uncontrolled type 2 diabetes mellitus with hyperglycemia (HCC) 04/18/2018   Vitamin D  deficiency 11/02/2022    Current Outpatient Medications:    Accu-Chek Softclix Lancets lancets, 1 each by Other route in the morning and at bedtime. Use as instructed, Disp: 100 each, Rfl: 6   aspirin  EC 81 MG tablet, Take 1 tablet (81 mg total) by mouth daily. Swallow whole., Disp: 30 tablet, Rfl: 12   Blood Glucose Monitoring Suppl (ACCU-CHEK AVIVA PLUS) w/Device KIT, 1 each by Does not apply route in the morning and at bedtime., Disp: 1 kit, Rfl: 0   cyclobenzaprine  (FLEXERIL ) 10 MG tablet, Take 1 tablet by mouth three times daily as needed for muscle spasm, Disp: 30 tablet, Rfl: 0   Dulaglutide  (TRULICITY ) 4.5 MG/0.5ML SOAJ, Inject 4.5 mg as directed once a week., Disp: 6 mL, Rfl: 1   glucose blood (ACCU-CHEK AVIVA PLUS) test strip, 1 each by Other route in the morning and at bedtime. Use as instructed, Disp: 100 each, Rfl: 6   magnesium  chloride (SLOW-MAG)  64 MG TBEC SR tablet, Take 1 tablet by mouth daily., Disp: , Rfl:    NURTEC 75 MG TBDP, TAKE 1 TABLET ON OR UNDER THE TONGUE AS NEEDED FOR MIGRAINE, Disp: 32 tablet, Rfl: 0   Omega-3 Fatty Acids (FISH OIL ) 1000 MG CAPS, Take 2 capsules (2,000 mg total) by mouth 2 (two) times daily., Disp: , Rfl:    predniSONE  (STERAPRED UNI-PAK 21 TAB) 10 MG (21) TBPK tablet, Take 6 tablets on day 1, 5 tablets day 2, 4 tablets day 3, 3 tablets day 4, 2 tablets day 5, 1 tablet day 6, Disp: 21  tablet, Rfl: 0   rosuvastatin  (CRESTOR ) 20 MG tablet, Take 1 tablet by mouth once daily, Disp: 90 tablet, Rfl: 0   tiZANidine (ZANAFLEX) 4 MG tablet, Take 1 tablet every 8 hours by oral route., Disp: , Rfl:    valsartan  (DIOVAN ) 40 MG tablet, Take 1 tablet by mouth once daily, Disp: 90 tablet, Rfl: 0   Vitamin D , Ergocalciferol , (DRISDOL ) 1.25 MG (50000 UNIT) CAPS capsule, Take 1 capsule (50,000 Units total) by mouth every 7 (seven) days., Disp: 12 capsule, Rfl: 1  Social History   Tobacco Use  Smoking Status Former   Current packs/day: 0.00   Average packs/day: 0.5 packs/day for 26.0 years (13.0 ttl pk-yrs)   Types: Cigarettes   Start date: 01/14/1991   Quit date: 01/13/2017   Years since quitting: 7.3  Smokeless Tobacco Never    Allergies  Allergen Reactions   Amlodipine  Itching   Objective:  There were no vitals filed for this visit. There is no height or weight on file to calculate BMI. Constitutional Well developed. Well nourished.  Vascular Dorsalis pedis pulses palpable bilaterally. Posterior tibial pulses palpable bilaterally. Capillary refill normal to all digits.  No cyanosis or clubbing noted. Pedal hair growth normal.  Neurologic Normal speech. Oriented to person, place, and time. Epicritic sensation to light touch grossly present bilaterally.  Dermatologic Nails right hallux and second digit nail dystrophy with thickened elongated dystrophic mycotic nail x 1 Skin within normal limits  Orthopedic: Normal joint ROM without pain or crepitus bilaterally. No visible deformities. No bony tenderness.   Radiographs: None Assessment:   1. Onychomycosis due to dermatophyte    Plan:  Patient was evaluated and treated and all questions answered.  Right hallux and second digit onychomycosis - All questions and concerns were discussed with the patient in extensive detail given the amount of pain that is and would benefit from removal of the nail however for now he  would like to hold off on that.  He will come back and see me when he is ready for the removal of the nail. - If there is no improvement he will come back and see me for total nail avulsion with phenol matricectomy  No follow-ups on file.

## 2024-05-05 ENCOUNTER — Ambulatory Visit
Admission: EM | Admit: 2024-05-05 | Discharge: 2024-05-05 | Disposition: A | Discharge status: Home / Self Care | Attending: Student | Admitting: Student

## 2024-05-05 DIAGNOSIS — K047 Periapical abscess without sinus: Secondary | ICD-10-CM | POA: Diagnosis not present

## 2024-05-05 MED ORDER — AMOXICILLIN-POT CLAVULANATE 875-125 MG PO TABS: 1.0000 | ORAL_TABLET | Freq: Two times a day (BID) | ORAL | 0 refills | Status: DC

## 2024-05-05 NOTE — ED Provider Notes (Signed)
 [facility]    CSN: [id number] Arrival date & time: 05/05/24  1544      History   Chief Complaint Chief Complaint  Patient presents with   Dental Pain    HPI Keith Barber is a 53 y.o. male presenting with dental infection.  He is a patient with [facility], and had an appointment earlier this week to have a broken left upper molar pulled, however unfortunately they were unable to do this as he is taking Trulicity .  He has rescheduled this appointment to 05/2024, however he has noticed swelling in his left sinus immediately above the broken tooth.  Concerned that there is infection.  HPI  Past Medical History:  Diagnosis Date   Adhesive capsulitis of left shoulder 12/15/2017   Anxiety and depression 06/04/2023   Benign neoplasm of rectosigmoid junction    Bilateral inguinal hernia without obstruction or gangrene    Bone spur    LEFT SHOULDER THAT MAKES HIS LEFT ARM TINGLE - had removed   Diabetes mellitus treated with injections of non-insulin  medication (HCC) 06/04/2023   Diverticulitis large intestine 09/2017   Diverticulosis of large intestine without diverticulitis    Dyslipidemia 06/04/2023   Encounter for medication refill 05/29/2023   Erythrocytosis 05/07/2022   Essential hypertension 06/04/2023   GERD (gastroesophageal reflux disease)    H/O migraine with aura 05/29/2023   Hand pain, left 07/09/2020   Hemorrhoids    History of colonic polyps 08/30/2022   History of diverticulitis 12/26/2017   History of stroke 06/08/2023   History of tobacco abuse 11/26/2016   Hyperlipidemia    Hypertension    Impingement syndrome of left shoulder region 07/05/2017   Internal hemorrhoids    Labral tear of shoulder, left, initial encounter 11/12/2015   Lateral epicondylitis, left elbow 07/09/2020   Migraines    rare migraines   Moderate aortic stenosis 06/08/2023   Ocular migraine with status migrainosus 03/04/2023   Onychomycosis 12/26/2017    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   Radial nerve compression, left 07/09/2020   Residual hemorrhoidal skin tags    Rotator cuff tendinitis, left 11/12/2015   Rupture of left long head biceps tendon 01/14/2020   Sleep apnea    Stomach irritation    Stress reaction 05/29/2023   Traumatic ecchymosis of left hand 07/09/2020   Type 2 diabetes mellitus without complications (HCC) 06/04/2023   Umbilical hernia without obstruction and without gangrene    Uncontrolled type 2 diabetes mellitus with hyperglycemia (HCC) 04/18/2018   Vitamin D  deficiency 11/02/2022    Patient Active Problem List   Diagnosis Date Noted   Aortic atherosclerosis 03/08/2024   History of stroke 06/08/2023   Moderate aortic stenosis 06/08/2023   Bone spur    Hemorrhoids    Hyperlipidemia    Hypertension    Sleep apnea    Essential hypertension 06/04/2023   Diabetes mellitus treated with injections of non-insulin  medication (HCC) 06/04/2023   Anxiety and depression 06/04/2023   Dyslipidemia 06/04/2023   Type 2 diabetes mellitus without complications (HCC) 06/04/2023   H/O migraine with aura 05/29/2023   Stress reaction 05/29/2023   Encounter for medication refill 05/29/2023   Ocular migraine with status migrainosus 03/04/2023   Vitamin D  deficiency 11/02/2022   History of colonic polyps 08/30/2022   Erythrocytosis 05/07/2022   Hand pain, left 07/09/2020   Lateral epicondylitis, left elbow 07/09/2020   Radial nerve compression, left 07/09/2020   Traumatic ecchymosis of left hand 07/09/2020   Rupture  of left long head biceps tendon 01/14/2020   Uncontrolled type 2 diabetes mellitus with hyperglycemia (HCC) 04/18/2018   Benign neoplasm of rectosigmoid junction    Internal hemorrhoids    Residual hemorrhoidal skin tags    Diverticulosis of large intestine without diverticulitis    Stomach irritation    GERD (gastroesophageal reflux disease)    History of diverticulitis 12/26/2017    Onychomycosis 12/26/2017   Adhesive capsulitis of left shoulder 12/15/2017   Diverticulitis large intestine 09/2017   Impingement syndrome of left shoulder region 07/05/2017   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   Umbilical hernia without obstruction and without gangrene    Bilateral inguinal hernia without obstruction or gangrene    History of tobacco abuse 11/26/2016   Migraines    Rotator cuff tendinitis, left 11/12/2015   Labral tear of shoulder, left, initial encounter 11/12/2015    Past Surgical History:  Procedure Laterality Date   72 HOUR PH STUDY N/A 05/30/2018   Procedure: 24 HOUR PH STUDY;  Surgeon: Janalyn Keene NOVAK, MD;  Location: [facility];  Service: Gastroenterology;  Laterality: N/A;   bicep tendon tear      COLONOSCOPY WITH PROPOFOL  N/A 02/01/2018   Procedure: COLONOSCOPY WITH PROPOFOL ;  Surgeon: Janalyn Keene NOVAK, MD;  Location: [facility];  Service: Endoscopy;  Laterality: N/A;   COLONOSCOPY WITH PROPOFOL  N/A 08/30/2022   Procedure: COLONOSCOPY WITH PROPOFOL ;  Surgeon: Therisa Bi, MD;  Location: [facility];  Service: Gastroenterology;  Laterality: N/A;   ESOPHAGEAL MANOMETRY N/A 05/30/2018   Procedure: ESOPHAGEAL MANOMETRY (EM);  Surgeon: Janalyn Keene NOVAK, MD;  Location: [facility];  Service: Gastroenterology;  Laterality: N/A;   ESOPHAGOGASTRODUODENOSCOPY (EGD) WITH PROPOFOL  N/A 02/01/2018   Procedure: ESOPHAGOGASTRODUODENOSCOPY (EGD) WITH PROPOFOL ;  Surgeon: Janalyn Keene NOVAK, MD;  Location: [facility];  Service: Endoscopy;  Laterality: N/A;   HERNIA REPAIR     INGUINAL HERNIA REPAIR Bilateral 01/21/2017   Procedure: LAPAROSCOPIC BILATERAL INGUINAL HERNIA REPAIR;  Surgeon: Jordis Laneta FALCON, MD;  Location: [facility];  Service: General;  Laterality: Bilateral;   KNEE SURGERY Left    SHOULDER ARTHROSCOPY WITH DISTAL CLAVICLE RESECTION Left 07/05/2017   Procedure: LEFT SHOULDER ARTHROSCOPY, SUBACROMIAL  DECOMPRESSION, DISTAL CLAVICLE RESECTION, POSSIBLE MINI OPEN ROTATOR CUFF REPAIR;  Surgeon: Anderson Maude ORN, MD;  Location: [facility];  Service: Orthopedics;  Laterality: Left;   SHOULDER CLOSED REDUCTION Left 12/13/2017   Procedure: LEFT SHOULDER MANIPULATION with steroid injection;  Surgeon: Anderson Maude ORN, MD;  Location: [facility];  Service: Orthopedics;  Laterality: Left;   SHOULDER SURGERY Right    UMBILICAL HERNIA REPAIR N/A 01/21/2017   Procedure: HERNIA REPAIR UMBILICAL ADULT;  Surgeon: Jordis Laneta FALCON, MD;  Location: [facility];  Service: General;  Laterality: N/A;       Home Medications    Prior to Admission medications   Medication Sig Start Date End Date Taking? Authorizing Provider  amoxicillin-clavulanate (AUGMENTIN) 875-125 MG tablet Take 1 tablet by mouth every 12 (twelve) hours. 05/05/24  Yes Arlyss Leita BRAVO, PA-C  aspirin  EC 81 MG tablet Take 1 tablet (81 mg total) by mouth daily. Swallow whole. 06/05/23  Yes Patel, Sona, MD  rosuvastatin  (CRESTOR ) 20 MG tablet Take 1 tablet by mouth once daily 02/08/24  Yes Melvin Pao, NP  Accu-Chek Softclix Lancets lancets 1 each by Other route in the morning and at bedtime. Use as instructed 11/18/22   Melvin Pao, NP  Blood Glucose Monitoring Suppl (ACCU-CHEK AVIVA PLUS) w/Device KIT 1 each by Does not  apply route in the morning and at bedtime. 11/18/22   Melvin Pao, NP  cyclobenzaprine  (FLEXERIL ) 10 MG tablet Take 1 tablet by mouth three times daily as needed for muscle spasm 04/02/24   Cannady, Jolene T, NP  Dulaglutide  (TRULICITY ) 4.5 MG/0.5ML SOAJ Inject 4.5 mg as directed once a week. 02/15/24   Melvin Pao, NP  glucose blood (ACCU-CHEK AVIVA PLUS) test strip 1 each by Other route in the morning and at bedtime. Use as instructed 11/18/22   Melvin Pao, NP  magnesium  chloride (SLOW-MAG) 64 MG TBEC SR tablet Take 1 tablet by mouth daily.    [provider]  NURTEC 75 MG TBDP TAKE 1 TABLET ON OR UNDER THE  TONGUE AS NEEDED FOR MIGRAINE 04/17/24   Melvin Pao, NP  Omega-3 Fatty Acids (FISH OIL ) 1000 MG CAPS Take 2 capsules (2,000 mg total) by mouth 2 (two) times daily. 06/08/23   Revankar, Jennifer SAUNDERS, MD  predniSONE  (STERAPRED UNI-PAK 21 TAB) 10 MG (21) TBPK tablet Take 6 tablets on day 1, 5 tablets day 2, 4 tablets day 3, 3 tablets day 4, 2 tablets day 5, 1 tablet day 6 03/05/24   Bernardino Ditch, NP  tiZANidine (ZANAFLEX) 4 MG tablet Take 1 tablet every 8 hours by oral route. 08/14/23   [provider]  valsartan  (DIOVAN ) 40 MG tablet Take 1 tablet by mouth once daily 03/02/24   Melvin Pao, NP  Vitamin D , Ergocalciferol , (DRISDOL ) 1.25 MG (50000 UNIT) CAPS capsule Take 1 capsule (50,000 Units total) by mouth every 7 (seven) days. 11/02/22   Vicci Duwaine SQUIBB, DO    Family History Family History  Problem Relation Age of Onset   Diabetes Mother    Migraines Mother    Lung cancer Father    Cancer Father    Lung cancer Paternal Grandfather    COPD Neg Hx    Heart disease Neg Hx    Stroke Neg Hx    Sleep apnea Neg Hx     Social History Social History   Tobacco Use   Smoking status: Former    Current packs/day: 0.00    Average packs/day: 0.5 packs/day for 26.0 years (13.0 ttl pk-yrs)    Types: Cigarettes    Start date: 01/14/1991    Quit date: 01/13/2017    Years since quitting: 7.3   Smokeless tobacco: Never  Vaping Use   Vaping status: Never Used  Substance Use Topics   Alcohol use: No   Drug use: No     Allergies   Amlodipine    Review of Systems Review of Systems  HENT:  Positive for dental problem.      Physical Exam Triage Vital Signs ED Triage Vitals [05/05/24 1557]  Encounter Vitals Group     BP 121/83     Girls Systolic BP Percentile      Girls Diastolic BP Percentile      Boys Systolic BP Percentile      Boys Diastolic BP Percentile      Pulse Rate 94     Resp 18     Temp 98.5 F (36.9 C)     Temp Source Oral     SpO2 96 %     Weight       Height      Head Circumference      Peak Flow      Pain Score 3     Pain Loc      Pain Education  Exclude from Growth Chart    No data found.  Updated Vital Signs BP 121/83 (BP Location: Right Arm)   Pulse 94   Temp 98.5 F (36.9 C) (Oral)   Resp 18   SpO2 96%   Visual Acuity Right Eye Distance:   Left Eye Distance:   Bilateral Distance:    Right Eye Near:   Left Eye Near:    Bilateral Near:     Physical Exam Vitals reviewed.  Constitutional:      General: He is not in acute distress.    Appearance: Normal appearance. He is not ill-appearing.  HENT:     Head: Normocephalic and atraumatic.     Mouth/Throat:     Comments: Left upper molar is broken.  There is mild swelling surrounding this molar inside his mouth, and tenderness in the maxillary sinus immediately above the tooth. Pulmonary:     Effort: Pulmonary effort is normal.  Neurological:     General: No focal deficit present.     Mental Status: He is alert and oriented to person, place, and time.  Psychiatric:        Mood and Affect: Mood normal.        Behavior: Behavior normal.        Thought Content: Thought content normal.        Judgment: Judgment normal.      UC Treatments / Results  Labs (all labs ordered are listed, but only abnormal results are displayed) Labs Reviewed - No data to display  EKG   Radiology No results found.  Procedures Procedures (including critical care time)  Medications Ordered in UC Medications - No data to display  Initial Impression / Assessment and Plan / UC Course  I have reviewed the triage vital signs and the nursing notes.  Pertinent labs & imaging results that were available during my care of the patient were reviewed by me and considered in my medical decision making (see chart for details).     Patient is a 53 year old male presenting with dental infection.  Will manage with Augmentin, and the mouthwash that his dentist has sent.  He has a  follow-up appointment with his dentist in about a month to have the tooth pulled.  Final Clinical Impressions(s) / UC Diagnoses   Final diagnoses:  Dental infection     Discharge Instructions      -Start the antibiotic-Augmentin (amoxicillin-clavulanate), 1 pill every 12 hours for 7 days.  You can take this with food like with breakfast and dinner. -Mouthwash as prescribed by your dentist -Follow-up with dentist as scheduled     ED Prescriptions     Medication Sig Dispense Auth. Provider   amoxicillin-clavulanate (AUGMENTIN) 875-125 MG tablet Take 1 tablet by mouth every 12 (twelve) hours. 14 tablet Nahuel Wilbert E, PA-C      PDMP not reviewed this encounter.   Arlyss Leita BRAVO, PA-C 05/05/24 1616

## 2024-05-05 NOTE — Discharge Instructions (Signed)
-  Start the antibiotic-Augmentin (amoxicillin-clavulanate), 1 pill every 12 hours for 7 days.  You can take this with food like with breakfast and dinner. -Mouthwash as prescribed by your dentist -Follow-up with dentist as scheduled

## 2024-05-05 NOTE — ED Triage Notes (Signed)
 Patient states that he has a broken tooth on the top left. Pain x 2 days. Noticed swelling

## 2024-05-08 ENCOUNTER — Other Ambulatory Visit: Payer: Self-pay | Admitting: Nurse Practitioner

## 2024-05-10 NOTE — Telephone Encounter (Signed)
 Requested Prescriptions  Pending Prescriptions Disp Refills   rosuvastatin  (CRESTOR ) 20 MG tablet [Pharmacy Med Name: Rosuvastatin  Calcium  20 MG Oral Tablet] 90 tablet 1    Sig: Take 1 tablet by mouth once daily     Cardiovascular:  Antilipid - Statins 2 Failed - 05/10/2024 10:14 AM      Failed - Lipid Panel in normal range within the last 12 months    Cholesterol, Total  Date Value Ref Range Status  02/14/2024 127 100 - 199 mg/dL Final   LDL Chol Calc (NIH)  Date Value Ref Range Status  02/14/2024 58 0 - 99 mg/dL Final   HDL  Date Value Ref Range Status  02/14/2024 39 (L) >39 mg/dL Final   Triglycerides  Date Value Ref Range Status  02/14/2024 181 (H) 0 - 149 mg/dL Final         Passed - Cr in normal range and within 360 days    Creatinine, Ser  Date Value Ref Range Status  02/14/2024 1.08 0.76 - 1.27 mg/dL Final         Passed - Patient is not pregnant      Passed - Valid encounter within last 12 months    Recent Outpatient Visits           2 months ago Uncontrolled type 2 diabetes mellitus with hyperglycemia [facility])   [place] [facility] [place], Darice, NP   7 months ago Migraine with aura and without status migrainosus, not intractable   [place] [facility] Melvin Darice, NP   8 months ago Migraine with aura and without status migrainosus, not intractable   [place] [facility] Melvin Darice, NP

## 2024-05-17 ENCOUNTER — Encounter: Payer: Self-pay | Admitting: Nurse Practitioner

## 2024-05-17 ENCOUNTER — Ambulatory Visit: Admitting: Nurse Practitioner

## 2024-05-17 VITALS — BP 114/77 | HR 81 | Temp 98.0°F | Ht 68.0 in | Wt 192.8 lb

## 2024-05-17 DIAGNOSIS — I10 Essential (primary) hypertension: Secondary | ICD-10-CM | POA: Diagnosis not present

## 2024-05-17 DIAGNOSIS — G43109 Migraine with aura, not intractable, without status migrainosus: Secondary | ICD-10-CM

## 2024-05-17 DIAGNOSIS — E119 Type 2 diabetes mellitus without complications: Secondary | ICD-10-CM

## 2024-05-17 DIAGNOSIS — Z7985 Long-term (current) use of injectable non-insulin antidiabetic drugs: Secondary | ICD-10-CM

## 2024-05-17 DIAGNOSIS — E785 Hyperlipidemia, unspecified: Secondary | ICD-10-CM

## 2024-05-17 DIAGNOSIS — E782 Mixed hyperlipidemia: Secondary | ICD-10-CM | POA: Diagnosis not present

## 2024-05-17 MED ORDER — NURTEC 75 MG PO TBDP: 75.0000 mg | ORAL_TABLET | ORAL | 3 refills | Status: AC

## 2024-05-17 MED ORDER — CYCLOBENZAPRINE HCL 10 MG PO TABS: 10.0000 mg | ORAL_TABLET | Freq: Three times a day (TID) | ORAL | 3 refills | Status: AC | PRN

## 2024-05-17 NOTE — Assessment & Plan Note (Signed)
Chronic.  Controlled.  Continue with current medication regimen of Rosuvastatin.  Labs ordered today.  Return to clinic in 6 months for reevaluation.  Call sooner if concerns arise.    

## 2024-05-17 NOTE — Assessment & Plan Note (Signed)
 Chronic.  Controlled.  Continue with current medication regimen of valsartan .  Labs ordered today.  Return to clinic in 6 months for reevaluation.  Call sooner if concerns arise.

## 2024-05-17 NOTE — Assessment & Plan Note (Signed)
 Chronic.  A1c improved in July 6.9%.  Sugars are running 120-130.  Eye exam up to date.   Continue with Trulicity  4.5mg .  Labs ordered today. Will make recommendations based on results.
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
## 2024-05-17 NOTE — Assessment & Plan Note (Signed)
 Chronic.  Goal is LDL <150.  Currently on Rosuvastatin 20mg .  Continue with current medication regimen.  Labs ordered today.  Follow up in 6 months.  Call sooner if concerns arise.

## 2024-05-17 NOTE — Assessment & Plan Note (Signed)
 Chronic. Patient has been using the Nurtec every other day for prevention of acute migraines.  This was recommended by Neurology.  He is doing well with it.  However, he has run out of the medication and needs a refill.

## 2024-05-18 ENCOUNTER — Ambulatory Visit: Payer: Self-pay | Admitting: Nurse Practitioner

## 2024-05-18 LAB — COMPREHENSIVE METABOLIC PANEL WITH GFR
ALT: 49 IU/L — ABNORMAL HIGH (ref 0–44)
AST: 50 IU/L — ABNORMAL HIGH (ref 0–40)
Albumin: 4.6 g/dL (ref 3.8–4.9)
Alkaline Phosphatase: 157 IU/L — ABNORMAL HIGH (ref 47–123)
BUN/Creatinine Ratio: 13 (ref 9–20)
BUN: 11 mg/dL (ref 6–24)
Bilirubin Total: 1 mg/dL (ref 0.0–1.2)
CO2: 20 mmol/L (ref 20–29)
Calcium: 9.1 mg/dL (ref 8.7–10.2)
Chloride: 100 mmol/L (ref 96–106)
Creatinine, Ser: 0.85 mg/dL (ref 0.76–1.27)
Globulin, Total: 2.9 g/dL (ref 1.5–4.5)
Glucose: 281 mg/dL — ABNORMAL HIGH (ref 70–99)
Potassium: 4 mmol/L (ref 3.5–5.2)
Sodium: 136 mmol/L (ref 134–144)
Total Protein: 7.5 g/dL (ref 6.0–8.5)
eGFR: 103 mL/min/1.73 (ref >59)

## 2024-05-18 LAB — LIPID PANEL
Chol/HDL Ratio: 4 ratio (ref 0.0–5.0)
Cholesterol, Total: 140 mg/dL (ref 100–199)
HDL: 35 mg/dL — ABNORMAL LOW (ref >39)
LDL Chol Calc (NIH): 71 mg/dL (ref 0–99)
Triglycerides: 206 mg/dL — ABNORMAL HIGH (ref 0–149)
VLDL Cholesterol Cal: 34 mg/dL (ref 5–40)

## 2024-05-18 LAB — HEMOGLOBIN A1C
Est. average glucose Bld gHb Est-mCnc: 177 mg/dL
Hgb A1c MFr Bld: 7.8 % — ABNORMAL HIGH (ref 4.8–5.6)

## 2024-05-22 ENCOUNTER — Ambulatory Visit: Admitting: Podiatry

## 2024-05-22 DIAGNOSIS — M216X2 Other acquired deformities of left foot: Secondary | ICD-10-CM | POA: Diagnosis not present

## 2024-05-22 DIAGNOSIS — M216X1 Other acquired deformities of right foot: Secondary | ICD-10-CM | POA: Diagnosis not present

## 2024-05-22 DIAGNOSIS — L600 Ingrowing nail: Secondary | ICD-10-CM

## 2024-05-22 NOTE — Patient Instructions (Signed)

## 2024-05-22 NOTE — Progress Notes (Signed)
 Subjective:  Patient ID: Keith Barber, male    DOB: June 07, 1971,  MRN: [id number]  Chief Complaint  Patient presents with   Nail Problem    Pt would like to have his 2 nails removed     53 y.o. male presents with the above complaint.  Patient presents for right hallux ingrown and right second digit ingrown nail border that is causing him a lot of discomfort.  He would like to have the nail removed and made permanent he is a diabetic with last A1c of 7.8%.  He also does not wear any orthotics he would like to discuss insole options.  Pain scale 7 out of 10   Review of Systems: Negative except as noted in the HPI. Denies N/V/F/Ch.  Past Medical History:  Diagnosis Date   Adhesive capsulitis of left shoulder 12/15/2017   Anxiety and depression 06/04/2023   Benign neoplasm of rectosigmoid junction    Bilateral inguinal hernia without obstruction or gangrene    Bone spur    LEFT SHOULDER THAT MAKES HIS LEFT ARM TINGLE - had removed   Diabetes mellitus treated with injections of non-insulin  medication (HCC) 06/04/2023   Diverticulitis large intestine 09/2017   Diverticulosis of large intestine without diverticulitis    Dyslipidemia 06/04/2023   Encounter for medication refill 05/29/2023   Erythrocytosis 05/07/2022   Essential hypertension 06/04/2023   GERD (gastroesophageal reflux disease)    H/O migraine with aura 05/29/2023   Hand pain, left 07/09/2020   Hemorrhoids    History of colonic polyps 08/30/2022   History of diverticulitis 12/26/2017   History of stroke 06/08/2023   History of tobacco abuse 11/26/2016   Hyperlipidemia    Hypertension    Impingement syndrome of left shoulder region 07/05/2017   Internal hemorrhoids    Labral tear of shoulder, left, initial encounter 11/12/2015   Lateral epicondylitis, left elbow 07/09/2020   Migraines    rare migraines   Moderate aortic stenosis 06/08/2023   Ocular migraine with status migrainosus 03/04/2023   Onychomycosis  12/26/2017   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   Radial nerve compression, left 07/09/2020   Residual hemorrhoidal skin tags    Rotator cuff tendinitis, left 11/12/2015   Rupture of left long head biceps tendon 01/14/2020   Sleep apnea    Stomach irritation    Stress reaction 05/29/2023   Traumatic ecchymosis of left hand 07/09/2020   Type 2 diabetes mellitus without complications (HCC) 06/04/2023   Umbilical hernia without obstruction and without gangrene    Uncontrolled type 2 diabetes mellitus with hyperglycemia (HCC) 04/18/2018   Vitamin D  deficiency 11/02/2022    Current Outpatient Medications:    Accu-Chek Softclix Lancets lancets, 1 each by Other route in the morning and at bedtime. Use as instructed, Disp: 100 each, Rfl: 6   aspirin  EC 81 MG tablet, Take 1 tablet (81 mg total) by mouth daily. Swallow whole., Disp: 30 tablet, Rfl: 12   Blood Glucose Monitoring Suppl (ACCU-CHEK AVIVA PLUS) w/Device KIT, 1 each by Does not apply route in the morning and at bedtime., Disp: 1 kit, Rfl: 0   cyclobenzaprine  (FLEXERIL ) 10 MG tablet, Take 1 tablet (10 mg total) by mouth 3 (three) times daily as needed. for muscle spams, Disp: 30 tablet, Rfl: 3   Dulaglutide  (TRULICITY ) 4.5 MG/0.5ML SOAJ, Inject 4.5 mg as directed once a week., Disp: 6 mL, Rfl: 1   glucose blood (ACCU-CHEK AVIVA PLUS) test strip, 1 each by Other route in the morning  and at bedtime. Use as instructed, Disp: 100 each, Rfl: 6   magnesium  chloride (SLOW-MAG) 64 MG TBEC SR tablet, Take 1 tablet by mouth daily., Disp: , Rfl:    Omega-3 Fatty Acids (FISH OIL ) 1000 MG CAPS, Take 2 capsules (2,000 mg total) by mouth 2 (two) times daily., Disp: , Rfl:    Rimegepant Sulfate (NURTEC) 75 MG TBDP, Take 1 tablet (75 mg total) by mouth every other day., Disp: 16 tablet, Rfl: 3   rosuvastatin  (CRESTOR ) 20 MG tablet, Take 1 tablet by mouth once daily, Disp: 90 tablet, Rfl: 2   tiZANidine (ZANAFLEX) 4 MG tablet,  Take 1 tablet every 8 hours by oral route., Disp: , Rfl:    valsartan  (DIOVAN ) 40 MG tablet, Take 1 tablet by mouth once daily, Disp: 90 tablet, Rfl: 0   Vitamin D , Ergocalciferol , (DRISDOL ) 1.25 MG (50000 UNIT) CAPS capsule, Take 1 capsule (50,000 Units total) by mouth every 7 (seven) days., Disp: 12 capsule, Rfl: 1  Social History   Tobacco Use  Smoking Status Former   Current packs/day: 0.00   Average packs/day: 0.5 packs/day for 26.0 years (13.0 ttl pk-yrs)   Types: Cigarettes   Start date: 01/14/1991   Quit date: 01/13/2017   Years since quitting: 7.3  Smokeless Tobacco Never    Allergies  Allergen Reactions   Amlodipine  Itching   Objective:  There were no vitals filed for this visit. There is no height or weight on file to calculate BMI. Constitutional Well developed. Well nourished.  Vascular Dorsalis pedis pulses palpable bilaterally. Posterior tibial pulses palpable bilaterally. Capillary refill normal to all digits.  No cyanosis or clubbing noted. Pedal hair growth normal.  Neurologic Normal speech. Oriented to person, place, and time. Epicritic sensation to light touch grossly present bilaterally.  Dermatologic Pain on palpation of the entire/total nail on 1st and 2nd digit of the right No other open wounds. No skin lesions.  Orthopedic: Normal joint ROM without pain or crepitus bilaterally. No visible deformities. No bony tenderness.   Radiographs: None Assessment:   1. Ingrown nail of great toe of right foot   2. Ingrown nail of second toe of right foot   3. Other acquired deformities of left foot   4. Other acquired deformities of right foot    Plan:  Patient was evaluated and treated and all questions answered.  Nail contusion/dystrophy right second and hallux ingrown, right -Patient elects to proceed with minor surgery to remove entire toenail today. Consent reviewed and signed by patient. -Entire/total nail excised. See procedure note. -Educated  on post-procedure care including soaking. Written instructions provided and reviewed. -Patient to follow up in 2 weeks for nail check.  Pes planovalgus/foot deformity -I explained to patient the etiology of pes planovalgus and relationship with heel pain/arch pain and various treatment options were discussed.  Given patient foot structure in the setting of heel pain/arch pain I believe patient will benefit from custom-made orthotics to help control the hindfoot motion support the arch of the foot and take the stress away from arches.  Patient agrees with the plan like to proceed with orthotics -Patient was casted for orthotics   Procedure: Excision of entire/total nail with phenol matricectomy Location: Right 1st toe and 2nd toe digit Anesthesia: Lidocaine  1% plain; 1.5 mL and Marcaine  0.5% plain; 1.5 mL, digital block. Skin Prep: Betadine. Dressing: Silvadene ; telfa; dry, sterile, compression dressing. Technique: Following skin prep, the toe was exsanguinated and a tourniquet was secured at the base of the  toe. The affected nail border was freed and excised.  Phenol matricectomy was performed in standard technique the tourniquet was then removed and sterile dressing applied. Disposition: Patient tolerated procedure well. Patient to return in 2 weeks for follow-up.   No follow-ups on file.

## 2024-05-25 ENCOUNTER — Other Ambulatory Visit: Payer: Self-pay | Admitting: Nurse Practitioner

## 2024-05-28 ENCOUNTER — Other Ambulatory Visit: Payer: Self-pay | Admitting: Nurse Practitioner

## 2024-05-28 NOTE — Telephone Encounter (Signed)
 Discontinued 02/15/24.  Requested Prescriptions  Pending Prescriptions Disp Refills   metFORMIN  (GLUCOPHAGE ) 500 MG tablet [Pharmacy Med Name: metFORMIN  HCl 500 MG Oral Tablet] 360 tablet 0    Sig: TAKE 2 TABLETS BY MOUTH TWICE DAILY WITH MEALS     Endocrinology:  Diabetes - Biguanides Failed - 05/28/2024 11:27 AM      Failed - B12 Level in normal range and within 720 days    No results found for: VITAMINB12       Failed - CBC within normal limits and completed in the last 12 months    WBC  Date Value Ref Range Status  06/03/2023 10.2 4.0 - 10.5 K/uL Final   WBC Count  Date Value Ref Range Status  09/19/2023 9.3 4.0 - 10.5 K/uL Final   RBC  Date Value Ref Range Status  09/19/2023 5.99 (H) 4.22 - 5.81 MIL/uL Final   Hemoglobin  Date Value Ref Range Status  09/19/2023 18.0 (H) 13.0 - 17.0 g/dL Final  95/88/7980 82.7 13.0 - 17.7 g/dL Final   HCT  Date Value Ref Range Status  09/19/2023 51.5 39.0 - 52.0 % Final   Hematocrit  Date Value Ref Range Status  10/27/2017 51.0 37.5 - 51.0 % Final   MCHC  Date Value Ref Range Status  09/19/2023 35.0 30.0 - 36.0 g/dL Final   [facility]  Date Value Ref Range Status  09/19/2023 30.1 26.0 - 34.0 pg Final   MCV  Date Value Ref Range Status  09/19/2023 86.0 80.0 - 100.0 fL Final  10/27/2017 90 79 - 97 fL Final   No results found for: PLTCOUNTKUC, LABPLAT, POCPLA RDW  Date Value Ref Range Status  09/19/2023 12.5 11.5 - 15.5 % Final  10/27/2017 12.8 12.3 - 15.4 % Final         Passed - Cr in normal range and within 360 days    Creatinine, Ser  Date Value Ref Range Status  05/17/2024 0.85 0.76 - 1.27 mg/dL Final         Passed - HBA1C is between 0 and 7.9 and within 180 days    HB A1C (BAYER DCA - WAIVED)  Date Value Ref Range Status  01/27/2023 7.0 (H) 4.8 - 5.6 % Final    Comment:             Prediabetes: 5.7 - 6.4          Diabetes: >6.4          Glycemic control for adults with diabetes: <7.0    Hgb A1c MFr  Bld  Date Value Ref Range Status  05/17/2024 7.8 (H) 4.8 - 5.6 % Final    Comment:             Prediabetes: 5.7 - 6.4          Diabetes: >6.4          Glycemic control for adults with diabetes: <7.0          Passed - eGFR in normal range and within 360 days    GFR calc Af Amer  Date Value Ref Range Status  02/05/2020 >60 >60 mL/min Final   GFR, Estimated  Date Value Ref Range Status  06/03/2023 >60 >60 mL/min Final    Comment:    (NOTE) Calculated using the CKD-EPI Creatinine Equation (2021)    eGFR  Date Value Ref Range Status  05/17/2024 103 >59 mL/min/1.73 Final         Passed - Valid encounter within  last 6 months    Recent Outpatient Visits           1 week ago Primary hypertension   [place] [facility] [place], Darice, NP   3 months ago Uncontrolled type 2 diabetes mellitus with hyperglycemia [facility])   [facility] Melvin Darice, NP   8 months ago Migraine with aura and without status migrainosus, not intractable   [place] [facility] Melvin Darice, NP   8 months ago Migraine with aura and without status migrainosus, not intractable    [facility] Melvin Darice, NP

## 2024-05-29 NOTE — Telephone Encounter (Signed)
 Requested Prescriptions  Pending Prescriptions Disp Refills   valsartan  (DIOVAN ) 40 MG tablet [Pharmacy Med Name: Valsartan  40 MG Oral Tablet] 90 tablet 0    Sig: Take 1 tablet by mouth once daily     Cardiovascular:  Angiotensin Receptor Blockers Passed - 05/29/2024  3:46 PM      Passed - Cr in normal range and within 180 days    Creatinine, Ser  Date Value Ref Range Status  05/17/2024 0.85 0.76 - 1.27 mg/dL Final         Passed - K in normal range and within 180 days    Potassium  Date Value Ref Range Status  05/17/2024 4.0 3.5 - 5.2 mmol/L Final         Passed - Patient is not pregnant      Passed - Last BP in normal range    BP Readings from Last 1 Encounters:  05/17/24 114/77         Passed - Valid encounter within last 6 months    Recent Outpatient Visits           1 week ago Primary hypertension   [place] [facility] [place], Darice, NP   3 months ago Uncontrolled type 2 diabetes mellitus with hyperglycemia [facility])   [place] [facility] Melvin Darice, NP   8 months ago Migraine with aura and without status migrainosus, not intractable   [place] [facility] Melvin Darice, NP   8 months ago Migraine with aura and without status migrainosus, not intractable   [place] [facility] Melvin Darice, NP

## 2024-06-09 ENCOUNTER — Encounter: Payer: Self-pay | Admitting: Emergency Medicine

## 2024-06-09 ENCOUNTER — Ambulatory Visit
Admission: EM | Admit: 2024-06-09 | Discharge: 2024-06-09 | Disposition: A | Discharge status: Home / Self Care | Attending: Physician Assistant | Admitting: Physician Assistant

## 2024-06-09 DIAGNOSIS — K047 Periapical abscess without sinus: Secondary | ICD-10-CM

## 2024-06-09 DIAGNOSIS — K0889 Other specified disorders of teeth and supporting structures: Secondary | ICD-10-CM

## 2024-06-09 DIAGNOSIS — K029 Dental caries, unspecified: Secondary | ICD-10-CM | POA: Diagnosis not present

## 2024-06-09 MED ORDER — AMOXICILLIN-POT CLAVULANATE 875-125 MG PO TABS: 1.0000 | ORAL_TABLET | Freq: Two times a day (BID) | ORAL | 0 refills | Status: AC

## 2024-06-09 NOTE — ED Triage Notes (Signed)
 Pt c/o rigth upp tooth pain. Started about 4 days ago. He noticed swelling about 3 days ago. He thinks he may have broken a tooth. He states he has an appt with a dentist on Monday. Pt has taken tylenol . He states he had some left over antibiotics and has been taking. (Augmentin )

## 2024-06-09 NOTE — ED Provider Notes (Signed)
 [facility]    CSN: [id number] Arrival date & time: 06/09/24  1008      History   Chief Complaint Chief Complaint  Patient presents with   Dental Pain    HPI Keith Barber is a 53 y.o. male presenting for 4-day history of pain and swelling of the tooth of the right upper side.  Patient has a history of many dental issues and actually has an appoint with an oral surgeon for multiple tooth removal in 2 days.  He has taken 1 tablet of Augmentin  and Keflex  a couple times a day from his and his wife's leftover prescriptions.  Symptoms have not really improved.  He reports swelling of his right cheek.  No associated fever.  Has been taking Tylenol , ibuprofen , and using Anbesol.  HPI  Past Medical History:  Diagnosis Date   Adhesive capsulitis of left shoulder 12/15/2017   Anxiety and depression 06/04/2023   Benign neoplasm of rectosigmoid junction    Bilateral inguinal hernia without obstruction or gangrene    Bone spur    LEFT SHOULDER THAT MAKES HIS LEFT ARM TINGLE - had removed   Diabetes mellitus treated with injections of non-insulin  medication (HCC) 06/04/2023   Diverticulitis large intestine 09/2017   Diverticulosis of large intestine without diverticulitis    Dyslipidemia 06/04/2023   Encounter for medication refill 05/29/2023   Erythrocytosis 05/07/2022   Essential hypertension 06/04/2023   GERD (gastroesophageal reflux disease)    H/O migraine with aura 05/29/2023   Hand pain, left 07/09/2020   Hemorrhoids    History of colonic polyps 08/30/2022   History of diverticulitis 12/26/2017   History of stroke 06/08/2023   History of tobacco abuse 11/26/2016   Hyperlipidemia    Hypertension    Impingement syndrome of left shoulder region 07/05/2017   Internal hemorrhoids    Labral tear of shoulder, left, initial encounter 11/12/2015   Lateral epicondylitis, left elbow 07/09/2020   Migraines    rare migraines   Moderate aortic stenosis 06/08/2023    Ocular migraine with status migrainosus 03/04/2023   Onychomycosis 12/26/2017   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   Radial nerve compression, left 07/09/2020   Residual hemorrhoidal skin tags    Rotator cuff tendinitis, left 11/12/2015   Rupture of left long head biceps tendon 01/14/2020   Sleep apnea    Stomach irritation    Stress reaction 05/29/2023   Traumatic ecchymosis of left hand 07/09/2020   Type 2 diabetes mellitus without complications (HCC) 06/04/2023   Umbilical hernia without obstruction and without gangrene    Uncontrolled type 2 diabetes mellitus with hyperglycemia (HCC) 04/18/2018   Vitamin D  deficiency 11/02/2022    Patient Active Problem List   Diagnosis Date Noted   Aortic atherosclerosis 03/08/2024   History of stroke 06/08/2023   Moderate aortic stenosis 06/08/2023   Bone spur    Hemorrhoids    Hyperlipidemia    Hypertension    Sleep apnea    Diabetes mellitus treated with injections of non-insulin  medication (HCC) 06/04/2023   Anxiety and depression 06/04/2023   Dyslipidemia 06/04/2023   Type 2 diabetes mellitus without complications (HCC) 06/04/2023   H/O migraine with aura 05/29/2023   Stress reaction 05/29/2023   Encounter for medication refill 05/29/2023   Ocular migraine with status migrainosus 03/04/2023   Vitamin D  deficiency 11/02/2022   History of colonic polyps 08/30/2022   Erythrocytosis 05/07/2022   Hand pain, left 07/09/2020   Lateral epicondylitis, left elbow 07/09/2020  Radial nerve compression, left 07/09/2020   Traumatic ecchymosis of left hand 07/09/2020   Rupture of left long head biceps tendon 01/14/2020   Uncontrolled type 2 diabetes mellitus with hyperglycemia (HCC) 04/18/2018   Benign neoplasm of rectosigmoid junction    Internal hemorrhoids    Residual hemorrhoidal skin tags    Diverticulosis of large intestine without diverticulitis    Stomach irritation    GERD (gastroesophageal reflux  disease)    History of diverticulitis 12/26/2017   Onychomycosis 12/26/2017   Adhesive capsulitis of left shoulder 12/15/2017   Diverticulitis large intestine 09/2017   Impingement syndrome of left shoulder region 07/05/2017   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   Umbilical hernia without obstruction and without gangrene    Bilateral inguinal hernia without obstruction or gangrene    History of tobacco abuse 11/26/2016   Migraines    Rotator cuff tendinitis, left 11/12/2015   Labral tear of shoulder, left, initial encounter 11/12/2015    Past Surgical History:  Procedure Laterality Date   65 HOUR PH STUDY N/A 05/30/2018   Procedure: 24 HOUR PH STUDY;  Surgeon: Janalyn Keene NOVAK, MD;  Location: [facility];  Service: Gastroenterology;  Laterality: N/A;   bicep tendon tear      COLONOSCOPY WITH PROPOFOL  N/A 02/01/2018   Procedure: COLONOSCOPY WITH PROPOFOL ;  Surgeon: Janalyn Keene NOVAK, MD;  Location: [facility] ENDOSCOPY;  Service: Endoscopy;  Laterality: N/A;   COLONOSCOPY WITH PROPOFOL  N/A 08/30/2022   Procedure: COLONOSCOPY WITH PROPOFOL ;  Surgeon: Therisa Bi, MD;  Location: [facility] ENDOSCOPY;  Service: Gastroenterology;  Laterality: N/A;   ESOPHAGEAL MANOMETRY N/A 05/30/2018   Procedure: ESOPHAGEAL MANOMETRY (EM);  Surgeon: Janalyn Keene NOVAK, MD;  Location: [facility];  Service: Gastroenterology;  Laterality: N/A;   ESOPHAGOGASTRODUODENOSCOPY (EGD) WITH PROPOFOL  N/A 02/01/2018   Procedure: ESOPHAGOGASTRODUODENOSCOPY (EGD) WITH PROPOFOL ;  Surgeon: Janalyn Keene NOVAK, MD;  Location: [facility];  Service: Endoscopy;  Laterality: N/A;   HERNIA REPAIR     INGUINAL HERNIA REPAIR Bilateral 01/21/2017   Procedure: LAPAROSCOPIC BILATERAL INGUINAL HERNIA REPAIR;  Surgeon: Jordis Laneta FALCON, MD;  Location: [facility] ORS;  Service: General;  Laterality: Bilateral;   KNEE SURGERY Left    NASAL SEPTUM SURGERY  04/11/2024   Done at [facility]   SHOULDER ARTHROSCOPY  WITH DISTAL CLAVICLE RESECTION Left 07/05/2017   Procedure: LEFT SHOULDER ARTHROSCOPY, SUBACROMIAL DECOMPRESSION, DISTAL CLAVICLE RESECTION, POSSIBLE MINI OPEN ROTATOR CUFF REPAIR;  Surgeon: Anderson Maude ORN, MD;  Location: [facility] OR;  Service: Orthopedics;  Laterality: Left;   SHOULDER CLOSED REDUCTION Left 12/13/2017   Procedure: LEFT SHOULDER MANIPULATION with steroid injection;  Surgeon: Anderson Maude ORN, MD;  Location: [facility] OR;  Service: Orthopedics;  Laterality: Left;   SHOULDER SURGERY Right    UMBILICAL HERNIA REPAIR N/A 01/21/2017   Procedure: HERNIA REPAIR UMBILICAL ADULT;  Surgeon: Jordis Laneta FALCON, MD;  Location: [facility] OR[facility];  Service: General;  Laterality: N/A;       Home Medications    Prior to Admission medications   Medication Sig Start Date End Date Taking? Authorizing Provider  amoxicillin -clavulanate (AUGMENTIN ) 875-125 MG tablet Take 1 tablet by mouth every 12 (twelve) hours for 7 days. 06/09/24 06/16/24 Yes Arvis Huxley B, PA-C  Accu-Chek Softclix Lancets lancets 1 each by Other route in the morning and at bedtime. Use as instructed 11/18/22   Melvin Pao, NP  aspirin  EC 81 MG tablet Take 1 tablet (81 mg total) by mouth daily. Swallow whole. 06/05/23   Patel, Sona, MD  Blood Glucose  Monitoring Suppl (ACCU-CHEK AVIVA PLUS) w/Device KIT 1 each by Does not apply route in the morning and at bedtime. 11/18/22   Melvin Pao, NP  cyclobenzaprine  (FLEXERIL ) 10 MG tablet Take 1 tablet (10 mg total) by mouth 3 (three) times daily as needed. for muscle spams 05/17/24   Melvin Pao, NP  Dulaglutide  (TRULICITY ) 4.5 MG/0.5ML SOAJ Inject 4.5 mg as directed once a week. 02/15/24   Melvin Pao, NP  glucose blood (ACCU-CHEK AVIVA PLUS) test strip 1 each by Other route in the morning and at bedtime. Use as instructed 11/18/22   Melvin Pao, NP  magnesium  chloride (SLOW-MAG) 64 MG TBEC SR tablet Take 1 tablet by mouth daily.    [provider]  Omega-3 Fatty Acids  (FISH OIL ) 1000 MG CAPS Take 2 capsules (2,000 mg total) by mouth 2 (two) times daily. 06/08/23   Revankar, Jennifer SAUNDERS, MD  Rimegepant Sulfate (NURTEC) 75 MG TBDP Take 1 tablet (75 mg total) by mouth every other day. 05/17/24   Melvin Pao, NP  rosuvastatin  (CRESTOR ) 20 MG tablet Take 1 tablet by mouth once daily 05/10/24   Melvin Pao, NP  tiZANidine (ZANAFLEX) 4 MG tablet Take 1 tablet every 8 hours by oral route. 08/14/23   [provider]  valsartan  (DIOVAN ) 40 MG tablet Take 1 tablet by mouth once daily 05/29/24   Melvin Pao, NP  Vitamin D , Ergocalciferol , (DRISDOL ) 1.25 MG (50000 UNIT) CAPS capsule Take 1 capsule (50,000 Units total) by mouth every 7 (seven) days. 11/02/22   Vicci Duwaine SQUIBB, DO    Family History Family History  Problem Relation Age of Onset   Diabetes Mother    Migraines Mother    Lung cancer Father    Cancer Father    Lung cancer Paternal Grandfather    COPD Neg Hx    Heart disease Neg Hx    Stroke Neg Hx    Sleep apnea Neg Hx     Social History Social History   Tobacco Use   Smoking status: Former    Current packs/day: 0.00    Average packs/day: 0.5 packs/day for 26.0 years (13.0 ttl pk-yrs)    Types: Cigarettes    Start date: 01/14/1991    Quit date: 01/13/2017    Years since quitting: 7.4   Smokeless tobacco: Never  Vaping Use   Vaping status: Never Used  Substance Use Topics   Alcohol use: No   Drug use: No     Allergies   Amlodipine    Review of Systems Review of Systems  Constitutional:  Negative for fatigue and fever.  HENT:  Positive for dental problem and facial swelling.   Neurological:  Negative for weakness.  Hematological:  Positive for adenopathy.     Physical Exam Triage Vital Signs ED Triage Vitals  Encounter Vitals Group     BP 06/09/24 1108 134/86     Girls Systolic BP Percentile --      Girls Diastolic BP Percentile --      Boys Systolic BP Percentile --      Boys Diastolic BP Percentile --       Pulse Rate 06/09/24 1108 97     Resp 06/09/24 1108 16     Temp 06/09/24 1108 98.7 F (37.1 C)     Temp Source 06/09/24 1108 Oral     SpO2 06/09/24 1108 94 %     Weight 06/09/24 1106 192 lb 14.4 oz (87.5 kg)     Height 06/09/24 1106 5' 8 (  1.727 m)     Head Circumference --      Peak Flow --      Pain Score 06/09/24 1105 8     Pain Loc --      Pain Education --      Exclude from Growth Chart --    No data found.  Updated Vital Signs BP 134/86 (BP Location: Right Arm)   Pulse 97   Temp 98.7 F (37.1 C) (Oral)   Resp 16   Ht 5' 8 (1.727 m)   Wt 192 lb 14.4 oz (87.5 kg)   SpO2 94%   BMI 29.33 kg/m      Physical Exam Vitals and nursing note reviewed.  Constitutional:      General: He is not in acute distress.    Appearance: Normal appearance. He is well-developed.  HENT:     Head: Normocephalic and atraumatic.     Nose: Nose normal.     Mouth/Throat:     Mouth: Mucous membranes are moist.     Dentition: Abnormal dentition. Dental tenderness, gingival swelling and dental caries present.     Pharynx: Oropharynx is clear.      Comments: Tooth in picture is broken with surrounding erythema and swelling.  Tooth is tender to touch.  Mild to moderate swelling of right cheek. Eyes:     General: No scleral icterus.    Conjunctiva/sclera: Conjunctivae normal.  Cardiovascular:     Rate and Rhythm: Normal rate and regular rhythm.  Pulmonary:     Effort: Pulmonary effort is normal. No respiratory distress.     Breath sounds: Normal breath sounds.  Musculoskeletal:     Cervical back: Neck supple.  Skin:    General: Skin is warm and dry.     Capillary Refill: Capillary refill takes less than 2 seconds.  Neurological:     General: No focal deficit present.     Mental Status: He is alert. Mental status is at baseline.     Motor: No weakness.     Gait: Gait normal.  Psychiatric:        Mood and Affect: Mood normal.        Behavior: Behavior normal.      UC  Treatments / Results  Labs (all labs ordered are listed, but only abnormal results are displayed) Labs Reviewed - No data to display  EKG   Radiology No results found.  Procedures Procedures (including critical care time)  Medications Ordered in UC Medications - No data to display  Initial Impression / Assessment and Plan / UC Course  I have reviewed the triage vital signs and the nursing notes.  Pertinent labs & imaging results that were available during my care of the patient were reviewed by me and considered in my medical decision making (see chart for details).   53 year old male presents for dental pain of the right upper side for the past few days.  Has taken 1 dose of Augmentin  and several doses of Keflex  without relief.  No fever.  Has dental appointment with oral surgeon in 2 days.  Patient has broken tooth right upper side with obvious infection.  Will treat with Augmentin .  Continue ibuprofen , Tylenol  and Anbesol.  I did offer him a couple days of something for pain relief but he declines.  Advised to keep oral surgery appointment.  Reviewed going to ED if fever, increased pain or increased swelling.   Final Clinical Impressions(s) / UC Diagnoses   Final diagnoses:  Pain, dental  Dental caries  Dental infection   Discharge Instructions   None    ED Prescriptions     Medication Sig Dispense Auth. Provider   amoxicillin -clavulanate (AUGMENTIN ) 875-125 MG tablet Take 1 tablet by mouth every 12 (twelve) hours for 7 days. 14 tablet Finnean Cerami B, PA-C      PDMP not reviewed this encounter.   Arvis Jolan NOVAK, PA-C 06/09/24 1142

## 2024-06-21 ENCOUNTER — Encounter: Payer: Self-pay | Admitting: Nurse Practitioner

## 2024-06-21 ENCOUNTER — Ambulatory Visit: Admitting: Nurse Practitioner

## 2024-06-21 VITALS — BP 118/67 | HR 97 | Temp 97.7°F | Ht 67.99 in | Wt 184.8 lb

## 2024-06-21 DIAGNOSIS — E1165 Type 2 diabetes mellitus with hyperglycemia: Secondary | ICD-10-CM | POA: Diagnosis not present

## 2024-06-21 DIAGNOSIS — Z7985 Long-term (current) use of injectable non-insulin antidiabetic drugs: Secondary | ICD-10-CM | POA: Diagnosis not present

## 2024-06-21 DIAGNOSIS — Z23 Encounter for immunization: Secondary | ICD-10-CM | POA: Diagnosis not present

## 2024-06-21 LAB — BAYER DCA HB A1C WAIVED: HB A1C (BAYER DCA - WAIVED): 9.6 % — ABNORMAL HIGH (ref 4.8–5.6)

## 2024-06-21 MED ORDER — OZEMPIC (0.25 OR 0.5 MG/DOSE) 2 MG/1.5ML ~~LOC~~ SOPN: 0.2500 mg | PEN_INJECTOR | SUBCUTANEOUS | 1 refills | Status: DC

## 2024-06-21 NOTE — Progress Notes (Signed)
 BP 118/67 (BP Location: Right Arm, Patient Position: Sitting, Cuff Size: Normal)   Pulse 97   Temp 97.7 F (36.5 C) (Oral)   Ht 5' 7.99 (1.727 m)   Wt 184 lb 12.8 oz (83.8 kg)   SpO2 97%   BMI 28.11 kg/m    Subjective:    Patient ID: Keith Barber, male    DOB: 29-May-1971, 53 y.o.   MRN: [id number]  HPI: Keith Barber is a 53 y.o. male  Chief Complaint  Patient presents with   Diabetes    Patient states his weight is up, his mouth is dry, he is drink excessive water and still feels dehydrated. He feels tired, his memory is getting hazy, he states he feels off. Patient also stated he drinks a quart of chocolate milk every night.   DIABETES Patient states she is more tired, more thirsty, more urination.  He  is drinking 4-6 bottles of water from 10-3.  He is drinking about a quart of milk and chocolate milk.  He feels like he can't get it back under control after his mouth surgery.  Does have some blurred vision.   Relevant past medical, surgical, family and social history reviewed and updated as indicated. Interim medical history since our last visit reviewed. Allergies and medications reviewed and updated.  Review of Systems  Constitutional:  Positive for fatigue.  Eyes:  Positive for visual disturbance.  Endocrine: Positive for polyphagia and polyuria.    Per HPI unless specifically indicated above     Objective:    BP 118/67 (BP Location: Right Arm, Patient Position: Sitting, Cuff Size: Normal)   Pulse 97   Temp 97.7 F (36.5 C) (Oral)   Ht 5' 7.99 (1.727 m)   Wt 184 lb 12.8 oz (83.8 kg)   SpO2 97%   BMI 28.11 kg/m   Wt Readings from Last 3 Encounters:  06/21/24 184 lb 12.8 oz (83.8 kg)  06/09/24 192 lb 14.4 oz (87.5 kg)  05/17/24 192 lb 12.8 oz (87.5 kg)    Physical Exam Vitals and nursing note reviewed.  Constitutional:      General: He is not in acute distress.    Appearance: Normal appearance. He is not ill-appearing, toxic-appearing  or diaphoretic.  HENT:     Head: Normocephalic.     Right Ear: External ear normal.     Left Ear: External ear normal.     Nose: Nose normal. No congestion or rhinorrhea.     Mouth/Throat:     Mouth: Mucous membranes are moist.  Eyes:     General:        Right eye: No discharge.        Left eye: No discharge.     Extraocular Movements: Extraocular movements intact.     Conjunctiva/sclera: Conjunctivae normal.     Pupils: Pupils are equal, round, and reactive to light.  Cardiovascular:     Rate and Rhythm: Normal rate and regular rhythm.     Heart sounds: No murmur heard. Pulmonary:     Effort: Pulmonary effort is normal. No respiratory distress.     Breath sounds: Normal breath sounds. No wheezing, rhonchi or rales.  Abdominal:     General: Abdomen is flat. Bowel sounds are normal.  Musculoskeletal:     Cervical back: Normal range of motion and neck supple.  Skin:    General: Skin is warm and dry.     Capillary Refill: Capillary refill takes less than 2 seconds.  Neurological:     General: No focal deficit present.     Mental Status: He is alert and oriented to person, place, and time.  Psychiatric:        Mood and Affect: Mood normal.        Behavior: Behavior normal.        Thought Content: Thought content normal.        Judgment: Judgment normal.     Results for orders placed or performed in visit on 05/17/24  Comprehensive metabolic panel with GFR   Collection Time: 05/17/24  8:45 AM  Result Value Ref Range   Glucose 281 (H) 70 - 99 mg/dL   BUN 11 6 - 24 mg/dL   Creatinine, Ser 9.14 0.76 - 1.27 mg/dL   eGFR 896 >40 fO/fpw/8.26   BUN/Creatinine Ratio 13 9 - 20   Sodium 136 134 - 144 mmol/L   Potassium 4.0 3.5 - 5.2 mmol/L   Chloride 100 96 - 106 mmol/L   CO2 20 20 - 29 mmol/L   Calcium  9.1 8.7 - 10.2 mg/dL   Total Protein 7.5 6.0 - 8.5 g/dL   Albumin 4.6 3.8 - 4.9 g/dL   Globulin, Total 2.9 1.5 - 4.5 g/dL   Bilirubin Total 1.0 0.0 - 1.2 mg/dL   Alkaline  Phosphatase 157 (H) 47 - 123 IU/L   AST 50 (H) 0 - 40 IU/L   ALT 49 (H) 0 - 44 IU/L  Hemoglobin A1c   Collection Time: 05/17/24  8:45 AM  Result Value Ref Range   Hgb A1c MFr Bld 7.8 (H) 4.8 - 5.6 %   Est. average glucose Bld gHb Est-mCnc 177 mg/dL  Lipid panel   Collection Time: 05/17/24  8:45 AM  Result Value Ref Range   Cholesterol, Total 140 100 - 199 mg/dL   Triglycerides 793 (H) 0 - 149 mg/dL   HDL 35 (L) >60 mg/dL   VLDL Cholesterol Cal 34 5 - 40 mg/dL   LDL Chol Calc (NIH) 71 0 - 99 mg/dL   Chol/HDL Ratio 4.0 0.0 - 5.0 ratio      Assessment & Plan:   Problem List Items Addressed This Visit       Endocrine   Uncontrolled type 2 diabetes mellitus with hyperglycemia (HCC) - Primary   Chronic. Not well controlled.  A1c in office today is 9.6%.  Encouraged patient to stop drinking milk and chocolate milk. Will start Ozempic 0.25mg  weekly for 1 month.  Increase Ozempic to 0.5mg  weekly.  Follow up in 1 month.  Call sooner if concerns arise.       Relevant Medications   Semaglutide,0.25 or 0.5MG /DOS, (OZEMPIC, 0.25 OR 0.5 MG/DOSE,) 2 MG/1.5ML SOPN   Other Relevant Orders   Bayer DCA Hb A1c Waived   Other Visit Diagnoses       Need for COVID-19 vaccine       Relevant Orders   Pfizer Comirnaty Covid-19 Vaccine 66yrs & older (Completed)        Follow up plan: Return in about 1 month (around 07/22/2024) for Medication Management.

## 2024-06-21 NOTE — Assessment & Plan Note (Signed)
 Chronic. Not well controlled.  A1c in office today is 9.6%.  Encouraged patient to stop drinking milk and chocolate milk. Will start Ozempic 0.25mg  weekly for 1 month.  Increase Ozempic to 0.5mg  weekly.  Follow up in 1 month.  Call sooner if concerns arise.

## 2024-06-22 ENCOUNTER — Ambulatory Visit: Payer: Self-pay | Admitting: Nurse Practitioner

## 2024-06-25 ENCOUNTER — Telehealth: Payer: Self-pay

## 2024-06-25 NOTE — Telephone Encounter (Signed)
 Orthotics are in. Balance $0-[place] patient.

## 2024-07-04 ENCOUNTER — Ambulatory Visit: Payer: Self-pay

## 2024-07-04 NOTE — Telephone Encounter (Signed)
 FYI Only or Action Required?: Action required by provider: update on patient condition.  Patient was last seen in primary care on 06/21/2024 by Melvin Pao, NP.  Called Nurse Triage reporting Urinary Frequency.  Symptoms began several days ago.  Interventions attempted: Nothing.  Symptoms are: gradually worsening.  Triage Disposition: See PCP Within 2 Weeks  Patient/caregiver understands and will follow disposition?: Yes  Reason for Disposition  All other urine symptoms  Answer Assessment - Initial Assessment Questions Saw provider recently. Had first Jardiance injection last week. Also takes metformin . Patient changed med because dentist would do procedure on. A1C went up. Patient says he is doing what he can do control sugar. Has not checked sugar in a few days. Is having urinary frequency, every 30-45 minutes, more often than he was having when he talked to Pao Melvin at visit.  Severe dry mouth. No appointments available before his already scheduled 1/30 appointment. Patient states provider asked him before if he would do metformin  twice a day and he said no, but now would be willing to. Or will to be referred to Urology. [facility] [employer]. 1. SYMPTOM: What's the main symptom you're concerned about? (e.g., frequency, incontinence)     Drinking a lot. Urine is clear. 2. ONSET: When did the  frequency  start?     Few days ago 3. PAIN: Is there any pain? If Yes, ask: How bad is it? (Scale: 1-10; mild, moderate, severe)     Denies 4. CAUSE: What do you think is causing the symptoms?     Unsure 5. OTHER SYMPTOMS: Do you have any other symptoms? (e.g., blood in urine, fever, flank pain, pain with urination)     No new lower back pain.  Protocols used: Urinary Symptoms-A-AH

## 2024-07-05 ENCOUNTER — Other Ambulatory Visit: Payer: Self-pay | Admitting: Nurse Practitioner

## 2024-07-05 MED ORDER — METFORMIN HCL 1000 MG PO TABS: 1000.0000 mg | ORAL_TABLET | Freq: Two times a day (BID) | ORAL | 1 refills | Status: AC

## 2024-07-05 NOTE — Telephone Encounter (Signed)
 Okay to increase Metformin  to 1000mg  BID.  Please find out if he needs a new prescription.  Also ask him if he is checking his sugars?  Please find out what dose of Ozempic  he is taking and how many injections he has given himself.

## 2024-07-05 NOTE — Telephone Encounter (Signed)
 Metformin  1000mg  BID sent to the pharmacy.  After 4 total weeks of Ozempic  0.25mg  have him increase to 0.5mg  weekly.

## 2024-07-05 NOTE — Telephone Encounter (Signed)
 Called patient and left a vm in regards to questions about medications.  If patient calls back E2C2 may call and speak with him in regards to his Metformin  increase. Check to see if he needs a refill, and also his Ozempic  injections and how many he has given himself.

## 2024-07-06 NOTE — Telephone Encounter (Signed)
 I did follow back up with the patient and he has been made aware to take the Metformin  1000MG  TWICE daily.

## 2024-07-06 NOTE — Telephone Encounter (Signed)
 Called and spoke with the patient, and he did get both medications. He stated when he checked his blood sugar this morning it was 334, and he wants to know if it's okay to take Metformin  twice daily to maintain his levels.

## 2024-07-06 NOTE — Telephone Encounter (Signed)
 Yes. Patient should be taking metformin  1000mg  TWICE DAILY.  Sugars will not improve over night. It will be a gradual progression.

## 2024-07-13 ENCOUNTER — Encounter: Payer: Self-pay | Admitting: Nurse Practitioner

## 2024-07-13 MED ORDER — LANCETS MISC: 1.0000 | 0 refills | Status: AC

## 2024-07-13 MED ORDER — LANCET DEVICE MISC: 1.0000 | 0 refills | Status: AC

## 2024-07-13 MED ORDER — BLOOD GLUCOSE MONITORING SUPPL DEVI: 1.0000 | 0 refills | Status: AC

## 2024-07-13 MED ORDER — BLOOD GLUCOSE TEST VI STRP: 1.0000 | ORAL_STRIP | 0 refills | Status: AC

## 2024-08-09 ENCOUNTER — Encounter: Payer: Self-pay | Admitting: Nurse Practitioner

## 2024-08-17 ENCOUNTER — Encounter: Payer: Self-pay | Admitting: Nurse Practitioner

## 2024-08-17 ENCOUNTER — Ambulatory Visit: Admitting: Nurse Practitioner

## 2024-08-17 VITALS — BP 109/72 | HR 89 | Temp 97.5°F | Ht 67.99 in | Wt 182.6 lb

## 2024-08-17 DIAGNOSIS — Z7985 Long-term (current) use of injectable non-insulin antidiabetic drugs: Secondary | ICD-10-CM

## 2024-08-17 DIAGNOSIS — Z23 Encounter for immunization: Secondary | ICD-10-CM | POA: Diagnosis not present

## 2024-08-17 DIAGNOSIS — E1165 Type 2 diabetes mellitus with hyperglycemia: Secondary | ICD-10-CM

## 2024-08-17 LAB — HM DIABETES EYE EXAM

## 2024-08-17 MED ORDER — SEMAGLUTIDE (1 MG/DOSE) 4 MG/3ML ~~LOC~~ SOPN: 1.0000 mg | PEN_INJECTOR | SUBCUTANEOUS | 0 refills | Status: AC

## 2024-08-17 NOTE — Assessment & Plan Note (Signed)
 Chronic. Not well controlled.  Will increase Ozempic  to 1mg .  He started 0.5mg  weekly yesterday.  After 5 weeks increase to 1mg .  Prescription sent into the pharmacy.  Continue with Metformin  1000mg  daily.  Will check labs at next visit.

## 2024-08-17 NOTE — Progress Notes (Signed)
 "  BP 109/72 (BP Location: Right Arm, Patient Position: Sitting, Cuff Size: Normal)   Pulse 89   Temp (!) 97.5 F (36.4 C) (Oral)   Ht 5' 7.99 (1.727 m)   Wt 182 lb 9.6 oz (82.8 kg)   SpO2 98%   BMI 27.77 kg/m    Subjective:    Patient ID: Keith Barber, male    DOB: 06-Sep-1970, 54 y.o.   MRN: [id number]  HPI: Keith Barber is a 54 y.o. male  Chief Complaint  Patient presents with   Office visit    3 month f/u   DIABETES Patient states he figured out that having juice or pudding at night was causing his numbers to be high in the morning.  He is trying to cut out sodas.  Patient states he feels like his eye sight is improving.  States wearing his glasses makes it blurry. He has an eye doctor appointment this afternoon.  He is currently taking 0.5mg  weekly which he started yesterday.   Hypoglycemic episodes:no Polydipsia/polyuria: yes Visual disturbance: yes Chest pain: no Paresthesias: no Glucose Monitoring: yes  Accucheck frequency: Daily  Fasting glucose: 206-300  Post prandial:  Evening:  Before meals: Taking Insulin ?: no  Long acting insulin :  Short acting insulin : Blood Pressure Monitoring: not checking Retinal Examination: Up to Date Foot Exam: Up to Date Diabetic Education: Not Completed Pneumovax: Up to Date Influenza: Up to Date Aspirin : no   Denies HA, CP, SOB, dizziness, palpitations, visual changes, and lower extremity swelling.   Relevant past medical, surgical, family and social history reviewed and updated as indicated. Interim medical history since our last visit reviewed. Allergies and medications reviewed and updated.  Review of Systems  Eyes:  Negative for visual disturbance.  Respiratory:  Negative for chest tightness and shortness of breath.   Cardiovascular:  Negative for chest pain, palpitations and leg swelling.  Endocrine: Positive for polydipsia and polyuria.  Neurological:  Negative for dizziness, light-headedness,  numbness and headaches.    Per HPI unless specifically indicated above     Objective:    BP 109/72 (BP Location: Right Arm, Patient Position: Sitting, Cuff Size: Normal)   Pulse 89   Temp (!) 97.5 F (36.4 C) (Oral)   Ht 5' 7.99 (1.727 m)   Wt 182 lb 9.6 oz (82.8 kg)   SpO2 98%   BMI 27.77 kg/m   Wt Readings from Last 3 Encounters:  08/17/24 182 lb 9.6 oz (82.8 kg)  06/21/24 184 lb 12.8 oz (83.8 kg)  06/09/24 192 lb 14.4 oz (87.5 kg)    Physical Exam Vitals and nursing note reviewed.  Constitutional:      General: He is not in acute distress.    Appearance: Normal appearance. He is not ill-appearing, toxic-appearing or diaphoretic.  HENT:     Head: Normocephalic.     Right Ear: External ear normal.     Left Ear: External ear normal.     Nose: Nose normal. No congestion or rhinorrhea.     Mouth/Throat:     Mouth: Mucous membranes are moist.  Eyes:     General:        Right eye: No discharge.        Left eye: No discharge.     Extraocular Movements: Extraocular movements intact.     Conjunctiva/sclera: Conjunctivae normal.     Pupils: Pupils are equal, round, and reactive to light.  Cardiovascular:     Rate and Rhythm: Normal rate and regular  rhythm.     Heart sounds: No murmur heard. Pulmonary:     Effort: Pulmonary effort is normal. No respiratory distress.     Breath sounds: Normal breath sounds. No wheezing, rhonchi or rales.  Abdominal:     General: Abdomen is flat. Bowel sounds are normal.  Musculoskeletal:     Cervical back: Normal range of motion and neck supple.  Skin:    General: Skin is warm and dry.     Capillary Refill: Capillary refill takes less than 2 seconds.  Neurological:     General: No focal deficit present.     Mental Status: He is alert and oriented to person, place, and time.  Psychiatric:        Mood and Affect: Mood normal.        Behavior: Behavior normal.        Thought Content: Thought content normal.        Judgment: Judgment  normal.     Results for orders placed or performed in visit on 06/21/24  Bayer DCA Hb A1c Waived   Collection Time: 06/21/24  2:02 PM  Result Value Ref Range   HB A1C (BAYER DCA - WAIVED) 9.6 (H) 4.8 - 5.6 %      Assessment & Plan:   Problem List Items Addressed This Visit       Endocrine   Uncontrolled type 2 diabetes mellitus with hyperglycemia (HCC) - Primary   Chronic. Not well controlled.  Will increase Ozempic  to 1mg .  He started 0.5mg  weekly yesterday.  After 5 weeks increase to 1mg .  Prescription sent into the pharmacy.  Continue with Metformin  1000mg  daily.  Will check labs at next visit.        Relevant Medications   Semaglutide , 1 MG/DOSE, 4 MG/3ML SOPN   Other Visit Diagnoses       Flu vaccine need       Relevant Orders   Flu vaccine trivalent PF, 6mos and older(Flulaval,Afluria,Fluarix,Fluzone) (Completed)        Follow up plan: Return in about 2 months (around 10/15/2024) for HTN, HLD, DM2 FU.      "

## 2024-08-22 ENCOUNTER — Encounter: Payer: Self-pay | Admitting: Nurse Practitioner

## 2024-09-18 ENCOUNTER — Ambulatory Visit: Admitting: Oncology

## 2024-09-18 ENCOUNTER — Encounter

## 2024-09-18 ENCOUNTER — Other Ambulatory Visit

## 2024-10-16 ENCOUNTER — Ambulatory Visit: Admitting: Nurse Practitioner
